# Patient Record
Sex: Male | Born: 1935 | Race: White | Hispanic: No | Marital: Married | Smoking: Former smoker
Health system: Southern US, Community
[De-identification: ages and names within clinical notes are randomized; demographics above are authoritative.]

## PROBLEM LIST (undated history)

## (undated) DIAGNOSIS — E785 Hyperlipidemia, unspecified: Secondary | ICD-10-CM

## (undated) DIAGNOSIS — J189 Pneumonia, unspecified organism: Secondary | ICD-10-CM

## (undated) DIAGNOSIS — M1990 Unspecified osteoarthritis, unspecified site: Secondary | ICD-10-CM

## (undated) DIAGNOSIS — R609 Edema, unspecified: Secondary | ICD-10-CM

## (undated) DIAGNOSIS — N179 Acute kidney failure, unspecified: Secondary | ICD-10-CM

## (undated) DIAGNOSIS — I10 Essential (primary) hypertension: Secondary | ICD-10-CM

## (undated) DIAGNOSIS — I739 Peripheral vascular disease, unspecified: Secondary | ICD-10-CM

## (undated) DIAGNOSIS — I219 Acute myocardial infarction, unspecified: Secondary | ICD-10-CM

## (undated) DIAGNOSIS — Z5189 Encounter for other specified aftercare: Secondary | ICD-10-CM

## (undated) DIAGNOSIS — C799 Secondary malignant neoplasm of unspecified site: Secondary | ICD-10-CM

## (undated) DIAGNOSIS — Z932 Ileostomy status: Secondary | ICD-10-CM

## (undated) DIAGNOSIS — I340 Nonrheumatic mitral (valve) insufficiency: Secondary | ICD-10-CM

## (undated) DIAGNOSIS — I509 Heart failure, unspecified: Secondary | ICD-10-CM

## (undated) DIAGNOSIS — I519 Heart disease, unspecified: Secondary | ICD-10-CM

## (undated) DIAGNOSIS — I480 Paroxysmal atrial fibrillation: Secondary | ICD-10-CM

## (undated) DIAGNOSIS — D649 Anemia, unspecified: Secondary | ICD-10-CM

## (undated) DIAGNOSIS — I252 Old myocardial infarction: Secondary | ICD-10-CM

## (undated) DIAGNOSIS — Z87442 Personal history of urinary calculi: Secondary | ICD-10-CM

## (undated) DIAGNOSIS — I2510 Atherosclerotic heart disease of native coronary artery without angina pectoris: Secondary | ICD-10-CM

## (undated) DIAGNOSIS — Z923 Personal history of irradiation: Secondary | ICD-10-CM

## (undated) DIAGNOSIS — IMO0001 Reserved for inherently not codable concepts without codable children: Secondary | ICD-10-CM

## (undated) DIAGNOSIS — C801 Malignant (primary) neoplasm, unspecified: Secondary | ICD-10-CM

## (undated) DIAGNOSIS — M109 Gout, unspecified: Secondary | ICD-10-CM

## (undated) HISTORY — PX: COLECTOMY: SHX59

## (undated) HISTORY — DX: Atherosclerotic heart disease of native coronary artery without angina pectoris: I25.10

## (undated) HISTORY — PX: CATARACT EXTRACTION: SUR2

## (undated) HISTORY — DX: Heart disease, unspecified: I51.9

## (undated) HISTORY — DX: Essential (primary) hypertension: I10

## (undated) HISTORY — DX: Personal history of urinary calculi: Z87.442

## (undated) HISTORY — PX: APPENDECTOMY: SHX54

## (undated) HISTORY — DX: Old myocardial infarction: I25.2

## (undated) HISTORY — DX: Paroxysmal atrial fibrillation: I48.0

## (undated) HISTORY — DX: Acute kidney failure, unspecified: N17.9

## (undated) HISTORY — DX: Hyperlipidemia, unspecified: E78.5

---

## 1973-04-23 HISTORY — PX: ILEOSTOMY: SHX1783

## 2005-06-21 ENCOUNTER — Ambulatory Visit: Payer: Self-pay | Admitting: Internal Medicine

## 2005-06-29 ENCOUNTER — Ambulatory Visit: Payer: Self-pay | Admitting: Internal Medicine

## 2006-08-31 ENCOUNTER — Emergency Department (HOSPITAL_COMMUNITY): Admission: EM | Admit: 2006-08-31 | Discharge: 2006-08-31 | Payer: Self-pay | Admitting: Emergency Medicine

## 2008-10-07 ENCOUNTER — Encounter: Payer: Self-pay | Admitting: Internal Medicine

## 2009-07-19 ENCOUNTER — Encounter: Payer: Self-pay | Admitting: Family Medicine

## 2009-07-19 ENCOUNTER — Encounter: Payer: Self-pay | Admitting: Cardiology

## 2010-03-19 ENCOUNTER — Other Ambulatory Visit: Payer: Self-pay | Admitting: Emergency Medicine

## 2010-03-19 ENCOUNTER — Inpatient Hospital Stay (HOSPITAL_COMMUNITY): Admission: EM | Admit: 2010-03-19 | Discharge: 2010-03-21 | Payer: Self-pay | Admitting: Internal Medicine

## 2010-03-23 ENCOUNTER — Encounter: Payer: Self-pay | Admitting: Family Medicine

## 2010-03-24 LAB — CONVERTED CEMR LAB
CO2: 19 meq/L (ref 19–32)
Calcium: 9 mg/dL (ref 8.4–10.5)
Creatinine, Ser: 1.37 mg/dL (ref 0.40–1.50)
Glucose, Bld: 101 mg/dL — ABNORMAL HIGH (ref 70–99)
Sodium: 138 meq/L (ref 135–145)

## 2010-03-30 ENCOUNTER — Ambulatory Visit: Payer: Self-pay | Admitting: Family Medicine

## 2010-03-30 DIAGNOSIS — I10 Essential (primary) hypertension: Secondary | ICD-10-CM

## 2010-03-30 DIAGNOSIS — I2510 Atherosclerotic heart disease of native coronary artery without angina pectoris: Secondary | ICD-10-CM | POA: Insufficient documentation

## 2010-05-26 ENCOUNTER — Telehealth (INDEPENDENT_AMBULATORY_CARE_PROVIDER_SITE_OTHER): Payer: Self-pay | Admitting: *Deleted

## 2010-05-31 ENCOUNTER — Encounter: Payer: Self-pay | Admitting: Family Medicine

## 2010-06-01 ENCOUNTER — Ambulatory Visit: Payer: Self-pay

## 2010-06-01 ENCOUNTER — Encounter: Payer: Self-pay | Admitting: Family Medicine

## 2010-06-01 DIAGNOSIS — I739 Peripheral vascular disease, unspecified: Secondary | ICD-10-CM | POA: Insufficient documentation

## 2010-06-03 ENCOUNTER — Encounter: Payer: Self-pay | Admitting: Family Medicine

## 2010-07-10 ENCOUNTER — Encounter: Payer: Self-pay | Admitting: Internal Medicine

## 2010-08-04 ENCOUNTER — Ambulatory Visit: Payer: Self-pay | Admitting: Family Medicine

## 2010-08-04 ENCOUNTER — Ambulatory Visit: Payer: Self-pay | Admitting: Cardiology

## 2010-08-04 ENCOUNTER — Encounter: Payer: Self-pay | Admitting: Family Medicine

## 2010-08-04 ENCOUNTER — Inpatient Hospital Stay (HOSPITAL_COMMUNITY): Admission: EM | Admit: 2010-08-04 | Discharge: 2010-08-07 | Payer: Self-pay | Admitting: Emergency Medicine

## 2010-08-04 DIAGNOSIS — Z87448 Personal history of other diseases of urinary system: Secondary | ICD-10-CM

## 2010-08-04 DIAGNOSIS — E785 Hyperlipidemia, unspecified: Secondary | ICD-10-CM

## 2010-08-04 DIAGNOSIS — H3530 Unspecified macular degeneration: Secondary | ICD-10-CM | POA: Insufficient documentation

## 2010-08-04 DIAGNOSIS — Z8719 Personal history of other diseases of the digestive system: Secondary | ICD-10-CM | POA: Insufficient documentation

## 2010-08-06 ENCOUNTER — Encounter: Payer: Self-pay | Admitting: Family Medicine

## 2010-08-08 ENCOUNTER — Ambulatory Visit: Payer: Self-pay | Admitting: Family Medicine

## 2010-08-08 DIAGNOSIS — N210 Calculus in bladder: Secondary | ICD-10-CM | POA: Insufficient documentation

## 2010-08-09 ENCOUNTER — Encounter (INDEPENDENT_AMBULATORY_CARE_PROVIDER_SITE_OTHER): Payer: Self-pay | Admitting: *Deleted

## 2010-08-31 ENCOUNTER — Telehealth (INDEPENDENT_AMBULATORY_CARE_PROVIDER_SITE_OTHER): Payer: Self-pay | Admitting: *Deleted

## 2010-09-07 ENCOUNTER — Encounter: Payer: Self-pay | Admitting: Family Medicine

## 2010-09-12 ENCOUNTER — Ambulatory Visit: Payer: Self-pay | Admitting: Cardiology

## 2010-09-12 ENCOUNTER — Encounter: Payer: Self-pay | Admitting: Family Medicine

## 2010-10-05 ENCOUNTER — Telehealth (INDEPENDENT_AMBULATORY_CARE_PROVIDER_SITE_OTHER): Payer: Self-pay | Admitting: *Deleted

## 2010-10-06 ENCOUNTER — Encounter: Payer: Self-pay | Admitting: Cardiology

## 2010-10-06 ENCOUNTER — Encounter (HOSPITAL_COMMUNITY)
Admission: RE | Admit: 2010-10-06 | Discharge: 2010-10-21 | Payer: Self-pay | Source: Home / Self Care | Attending: Cardiology | Admitting: Cardiology

## 2010-10-06 ENCOUNTER — Ambulatory Visit: Payer: Self-pay

## 2010-10-06 ENCOUNTER — Ambulatory Visit: Payer: Self-pay | Admitting: Cardiology

## 2010-10-07 HISTORY — PX: CARDIOVASCULAR STRESS TEST: SHX262

## 2010-10-09 ENCOUNTER — Encounter: Payer: Self-pay | Admitting: Family Medicine

## 2010-10-11 ENCOUNTER — Ambulatory Visit: Payer: Self-pay | Admitting: Cardiology

## 2010-10-11 ENCOUNTER — Encounter: Payer: Self-pay | Admitting: Family Medicine

## 2010-10-14 ENCOUNTER — Telehealth (INDEPENDENT_AMBULATORY_CARE_PROVIDER_SITE_OTHER): Payer: Self-pay | Admitting: *Deleted

## 2010-10-14 ENCOUNTER — Ambulatory Visit (HOSPITAL_COMMUNITY): Admission: RE | Admit: 2010-10-14 | Discharge: 2010-10-14 | Payer: Self-pay | Admitting: Cardiology

## 2010-10-14 ENCOUNTER — Inpatient Hospital Stay (HOSPITAL_BASED_OUTPATIENT_CLINIC_OR_DEPARTMENT_OTHER): Admission: RE | Admit: 2010-10-14 | Discharge: 2010-10-14 | Payer: Self-pay | Admitting: Cardiology

## 2010-10-14 ENCOUNTER — Ambulatory Visit: Payer: Self-pay | Admitting: Cardiology

## 2010-10-14 ENCOUNTER — Encounter: Payer: Self-pay | Admitting: Cardiology

## 2010-10-14 HISTORY — PX: CARDIAC CATHETERIZATION: SHX172

## 2010-10-18 ENCOUNTER — Ambulatory Visit: Payer: Self-pay | Admitting: Surgery

## 2010-10-24 ENCOUNTER — Ambulatory Visit: Payer: Self-pay | Admitting: Cardiology

## 2010-10-24 ENCOUNTER — Ambulatory Visit: Payer: Self-pay | Admitting: Surgery

## 2010-10-24 ENCOUNTER — Inpatient Hospital Stay (HOSPITAL_COMMUNITY): Admission: RE | Admit: 2010-10-24 | Discharge: 2010-10-31 | Payer: Self-pay | Admitting: Surgery

## 2010-10-24 HISTORY — PX: CORONARY ARTERY BYPASS GRAFT: SHX141

## 2010-11-04 ENCOUNTER — Encounter: Payer: Self-pay | Admitting: Family Medicine

## 2010-11-04 ENCOUNTER — Ambulatory Visit: Payer: Self-pay | Admitting: Cardiology

## 2010-11-08 ENCOUNTER — Encounter: Payer: Self-pay | Admitting: Family Medicine

## 2010-11-09 ENCOUNTER — Ambulatory Visit: Payer: Self-pay | Admitting: Cardiology

## 2010-11-14 ENCOUNTER — Ambulatory Visit: Payer: Self-pay | Admitting: Cardiovascular Disease

## 2010-11-15 ENCOUNTER — Ambulatory Visit: Payer: Self-pay | Admitting: Surgery

## 2010-11-15 ENCOUNTER — Encounter: Admission: RE | Admit: 2010-11-15 | Discharge: 2010-11-15 | Payer: Self-pay | Admitting: Psychiatry

## 2010-11-21 ENCOUNTER — Ambulatory Visit: Payer: Self-pay | Admitting: Cardiology

## 2010-12-06 ENCOUNTER — Ambulatory Visit: Payer: Self-pay | Admitting: Surgery

## 2010-12-07 ENCOUNTER — Ambulatory Visit: Payer: Self-pay | Admitting: Cardiology

## 2010-12-07 ENCOUNTER — Encounter: Payer: Self-pay | Admitting: Cardiology

## 2011-01-11 ENCOUNTER — Ambulatory Visit: Payer: Self-pay | Admitting: Cardiology

## 2011-01-16 ENCOUNTER — Ambulatory Visit (HOSPITAL_COMMUNITY)
Admission: RE | Admit: 2011-01-16 | Discharge: 2011-01-16 | Payer: Self-pay | Source: Home / Self Care | Attending: Cardiology | Admitting: Cardiology

## 2011-01-16 ENCOUNTER — Ambulatory Visit: Admission: RE | Admit: 2011-01-16 | Discharge: 2011-01-16 | Payer: Self-pay | Source: Home / Self Care

## 2011-01-16 HISTORY — PX: TRANSTHORACIC ECHOCARDIOGRAM: SHX275

## 2011-01-23 ENCOUNTER — Other Ambulatory Visit: Payer: Self-pay | Admitting: Gastroenterology

## 2011-01-23 DIAGNOSIS — D509 Iron deficiency anemia, unspecified: Secondary | ICD-10-CM

## 2011-01-24 NOTE — Miscellaneous (Signed)
Summary: CABG  Clinical Lists Changes  Observations: Added new observation of PAST SURG HX: total colectomy with ileostomy for UC  CABG 10-24-2010 with postop A Fib (11/08/2010 15:59) Added new observation of PRIMARY MD: Seymour Bars DO (11/08/2010 15:59)        Past History:  Past Surgical History: total colectomy with ileostomy for UC  CABG 10-24-2010 with postop A Fib

## 2011-01-24 NOTE — Progress Notes (Signed)
Summary: Nuc Pre-Procedure  Phone Note Outgoing Call Call back at [facility] Phone [phone]   Call placed by: Antionette Char RN,  October 05, 2010 3:16 PM Call placed to: Patient Reason for Call: Confirm/change Appt Summary of Call: Reviewed information on Myoview Information Sheet (see scanned document for further details).  Spoke with patient.     Nuclear Med Background Indications for Stress Test: Evaluation for Ischemia   History: Myocardial Infarction  History Comments: 1970 MI     Nuclear Pre-Procedure Cardiac Risk Factors: Family History - CAD, Hypertension, Lipids Height (in): 65

## 2011-01-24 NOTE — Letter (Signed)
Summary: [facility]   Imported By: Lanelle Bal 11/21/2010 10:07:56  _____________________________________________________________________  External Attachment:    Type:   Image     Comment:   External Document

## 2011-01-24 NOTE — Assessment & Plan Note (Signed)
Summary: Cardiology Nuclear Testing  Nuclear Med Background Indications for Stress Test: Evaluation for Ischemia   History: Myocardial Infarction, Myocardial Perfusion Study  History Comments: '03 UEA:[id number] scar with ischemia at the apical aspect of the inferior wall, EF=43%  Symptoms: DOE    Nuclear Pre-Procedure Cardiac Risk Factors: Family History - CAD, History of Smoking, Hypertension, Lipids, Overweight Caffeine/Decaff Intake: None NPO After: 6:00 PM Lungs: Clear IV 0.9% NS with Angio Cath: 24g     IV Site: R Hand IV Started by: Irean Hong, RN Chest Size (in) 44     Height (in): 65 Weight (lb): 182 BMI: 30.40 Tech Comments: Held metoprolol x 24 hours.  Nuclear Med Study 1 or 2 day study:  1 day     Stress Test Type:  Stress Reading MD:  Olga Millers, MD     Referring MD:  Roger Shelter, MD Resting Radionuclide:  Technetium 44m Tetrofosmin     Resting Radionuclide Dose:  11 mCi  Stress Radionuclide:  Technetium 67m Tetrofosmin     Stress Radionuclide Dose:  33 mCi   Stress Protocol Exercise Time (min):  4:00 min     Max HR:  160 bpm     Predicted Max HR:  143 bpm  Max Systolic BP: 190 mm Hg     Percent Max HR:  111.89 %     METS: 5.7 Rate Pressure Product:  95621    Stress Test Technologist:  Rea College, CMA-N     Nuclear Technologist:  Doyne Keel, CNMT  Rest Procedure  Myocardial perfusion imaging was performed at rest 45 minutes following the intravenous administration of Technetium 71m Tetrofosmin.  Stress Procedure  Baseline EKG shows new ST-T wave changes.  The patient exercised for 4:00.  The patient stopped due to fatigue and denied any chest pain.  There were ST-T wave changes and multiple ectopy noted.  He had frequent PVC's with couplets, runs of PSVT and NSVT.  He had a hypertensive response noted in recovery, 190/105.  Technetium 47m Tetrofosmin was injected at peak exercise and myocardial perfusion imaging was performed after a brief  delay.                                                                                                                                                                                                                  EKG's and stress images were reviewed by Dr. Ladona Ridgel, DOD, and patient was given Metoprolol 50mg  1/4 tablet.  Patient was allowed to leave and follow up with  Dr. Deborah Chalk.  QPS Raw Data Images:  There is no interference from other nuclear activity. Stress Images:  There is decreased uptake in the inferior wall Rest Images:  There is decreased uptake in the inferior wall. Subtraction (SDS):  There is a fixed defect that is most consistent with a previous infarction. Transient Ischemic Dilatation:  1.13  (Normal <1.22)  Lung/Heart Ratio:  0.38  (Normal <0.45)  Quantitative Gated Spect Images QGS EDV:  226 ml QGS ESV:  152 ml QGS EF:  33 % QGS cine images:  Global hypokinesis; inferior akinesis; severe LVE.   Overall Impression  Exercise Capacity: Poor exercise capacity. BP Response: Normal blood pressure response. Clinical Symptoms: No chest pain ECG Impression: Significant ST abnormalities consistent with ischemia; 3 beats NSVT. Overall Impression: Abnormal stress nuclear study with prior inferior infarct; no ischemia.

## 2011-01-24 NOTE — Assessment & Plan Note (Signed)
Summary: HFU ARF   Vital Signs:  Patient profile:   77 year old male Height:      65 inches Weight:      178 pounds BMI:     29.73 O2 Sat:      100 % on Room air Pulse rate:   93 / minute BP sitting:   113 / 79  (left arm) Cuff size:   large  Vitals Entered By: Payton Spark CMA (August 08, 2010 9:43 AM)  O2 Flow:  Room air CC: Hosp f/u.    Primary Care Provider:  Seymour Bars DO  CC:  Hosp f/u. [name]  History of Present Illness: 77 yo WM presents for HFU visit today.  He was admitted to [facility] last Thursday and dischared home on Sunday after having a syncopal episode following a long hot car ride back from [place].  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  The combination of sweating, diarrhea, nasuae and his BP meds caused a drop in his BP and he was admitted with ARF, a Cr up to 5.2 which improved to 1.33 yesterday.  An Echo showed mild diastolic dyfunction and LVH but was o/w normal.  He was found to have kidney stones and a bladder stone.  He is not having problems passing his urine at this point.  He is supposed to have f/u with Dr [name].    He wants to change his cardiologist to Dr Deborah Chalk, whom his wife sees.  He denies CP, SOB, heart palpitations.  He is still having some anorexia and fatige but denies dizziness or nausea.  He is drinking more gatorade.    His BP meds were changed.  He was started on Flomax.  Current Medications (verified): 1)  Tamsulosin Hcl 0.4 Mg Caps (Tamsulosin Hcl) .... Take 1 Tab By Mouth At Bedtime 2)  Simvastatin 80 Mg Tabs (Simvastatin) .... Take 1 Tab By Mouth At Bedtime 3)  Folic Acid 400 Mcg Tabs (Folic Acid) .... Take 1 Tab By Mouth Once Daily 4)  Ascriptin 325 Mg Tabs (Aspirin Buf(Alhyd-Mghyd-Cacar)) .... Take 1 Tab By Mouth Once Daily 5)  Flaxseed Oil 1000 Mg Caps (Flaxseed (Linseed)) 6)  Fish Oil 1200 Mg Caps (Omega-3 Fatty Acids) 7)  Lisinopril 10 Mg Tabs (Lisinopril) .... Take 1 Tab By Mouth Once Daily 8)  Centrum Silver  Tabs  (Multiple Vitamins-Minerals) .... Once Daily 9)  Vitamin D3 400 Unit Tabs (Cholecalciferol) .... Otc Daily 10)  Icaps  Caps (Multiple Vitamins-Minerals) .... 2 Tablets Daily 11)  Lutein 20 Mg Caps (Lutein) .... 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 Retired.  Widowed in 02.  Remarried to [place]. Has 1 adopted daughter in [place] with teenage grandson. Quit smoking in 1970. 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   Head:  normocephalic, atraumatic, and male-pattern balding.   Eyes:  sclera non icteric Mouth:  pharynx pink and moist.   Neck:  no masses.  no JVD.  No carotid bruits Lungs:  Normal respiratory effort, chest expands symmetrically. Lungs are clear to auscultation, no crackles or wheezes. Heart:  normal rate and regular rhythm.  no M/R/G Abdomen:  soft, non-tender, and no distention.  no AA bruits Pulses:  2+ radial pulses Extremities:  no LE edema Skin:  color normal.  AKs on head no pallor.  R antecubital bruise Cervical Nodes:  No lymphadenopathy noted Psych:  good eye contact, not anxious appearing, and not depressed appearing.     Impression & Recommendations:  Problem # 1:  RENAL FAILURE, ACUTE (ICD-584.9) Assessment Improved Reviewed hospital labs/ notes.  It appears that his ARF resolved during hospital stay, discharged home with a CR of 1.33 ysterday, down from a peak of 5.2.   This was likely to be from dehydration/ hypotension.  He did recieve IV Fluid hydration during his stay.  He may have also had an obstructive componenet given his kidney/ bladder stones with hx of enlarged prostate.  he is to stay on the updated meds and will f/u with Dr [name] for the stones.  Stay on Flomax and given a strainer today for his urine.  He is not yet due for repeat renal function.  Problem # 2:  CALCULUS, BLADDER (ICD-594.1) Denies acute voiding problems or hematuria.  Will get him scheduled to see Dr Caleen Essex in the office.  Reviewed CT Abd/ Pelvis done in the hospital. Orders: Urology Referral (Urology)  Problem # 3:  ESSENTIAL HYPERTENSION, BENIGN (ICD-401.1) Assessment: Improved Continue new  meds.  Off Norvasc-- drug interaction with Sivmastatin 80. The following medications were removed from the medication list:    Norvasc 10 Mg Tabs (Amlodipine besylate) .[name]... 1 tab by mouth daily His updated medication list for this problem includes:    Lisinopril 10 Mg Tabs (Lisinopril) .[name]... Take 1 tab by mouth once daily  BP today: 113/79 Prior BP: 99/61 (08/04/2010)  Labs Reviewed: K+: 3.9 (03/23/2010) Creat: : 1.37 (03/23/2010)     Problem # 4:  CAD (ICD-414.00) Reviewed his cardiac w/u including ECHO done at [facility] last wk.  He wants to see Dr Deborah Chalk for f/u, so will get this scheduled.  Apparently, he just had carotid dopplers, aortic u/s and ABIs done thru [facility]. The following medications were removed from the medication list:    Norvasc 10 Mg Tabs (Amlodipine besylate) .[name]... 1 tab by mouth daily His updated medication list for this problem includes:    Ascriptin 325 Mg Tabs (Aspirin buf(alhyd-mghyd-cacar)) .[name]... Take 1 tab by mouth once daily    Lisinopril 10 Mg Tabs (Lisinopril) .[name]... Take 1 tab by mouth once daily  Complete Medication List: 1)  Tamsulosin Hcl 0.4 Mg Caps (Tamsulosin hcl) .... Take 1 tab by mouth at bedtime 2)  Simvastatin 80 Mg Tabs (Simvastatin) ....  Take 1 tab by mouth at bedtime 3)  Folic Acid 400 Mcg Tabs (Folic acid) .... Take 1 tab by mouth once daily 4)  Ascriptin 325 Mg Tabs (Aspirin buf(alhyd-mghyd-cacar)) .... Take 1 tab by mouth once daily 5)  Flaxseed Oil 1000 Mg Caps (Flaxseed (linseed)) 6)  Fish Oil 1200 Mg Caps (Omega-3 fatty acids) 7)  Lisinopril 10 Mg Tabs (Lisinopril) .... Take 1 tab by mouth once daily 8)  Centrum Silver Tabs (Multiple vitamins-minerals) .... Once daily 9)  Vitamin D3 400 Unit Tabs (Cholecalciferol) .... Otc daily 10)  Icaps Caps (Multiple vitamins-minerals) .... 2 tablets daily 11)  Lutein 20 Mg Caps (Lutein) .... Daily 12)  Metoprolol 12.5 Mg  .Marland KitchenMarland KitchenMarland Kitchen  1/2 tab by mouth bid  Other Orders: Cardiology Referral (Cardiology)  Patient Instructions: 1)  Stay on current meds. 2)  BP looks great. 3)  ECHO fairly normal. 4)  Cr improved to 1.33 yesterday. 5)  OK to return to work tomorrow. 6)  Referrals made to cardiologist and urologist.   7)  Return for follow up in 3 mos.

## 2011-01-24 NOTE — Letter (Signed)
Summary: [facility]   Imported By: Lanelle Bal 09/20/2010 11:33:19  _____________________________________________________________________  External Attachment:    Type:   Image     Comment:   External Document

## 2011-01-24 NOTE — Assessment & Plan Note (Signed)
Summary: NOV HFU for dehydration   Vital Signs:  Patient profile:   77 year old male Height:      65 inches Weight:      196 pounds BMI:     32.73 O2 Sat:      98 % on Room air Temp:     99.2 degrees F oral Pulse rate:   82 / minute BP sitting:   146 / 85  (left arm) Cuff size:   large  Vitals Entered By: Payton Spark CMA (March 30, 2010 8:38 AM)  O2 Flow:  Room air CC: New to est. Also c/o L elbow pain x 1 week.    Primary Care Provider:  Seymour Bars DO  CC:  New to est. Also c/o L elbow pain x 1 week. [name]  History of Present Illness: 77 yo WM presents for NOV.  He ate out last Thursday at the [employer] and had diarrhea that night.  He missed work Friday.  On Sat, his BP started dropping.  Went to the ED.  He was admitted to [facility] until Monday.  He was hydrated with iV fluids, his Lisinopril was held and he was treated with abx which he was not sent home on.  His f/u BMP last wk came back to normal with normal electrolytes and renal function.  His bowels and appetite have returned to normal.  He has continued to hold his Lisinopril.  He had been seeing Dr Juanita Laster at [place] but he lives closer to [place].  He has a hx of AMI at age 56, HTN, high cholesterol and ulcerative colitis.  He has not routinely followed with cardiology.  He reports being due for AAA screening.     He goes to the [place] [facility].      Current Medications (verified): 1)  Felodipine 10 Mg Xr24h-Tab (Felodipine) .... Take 1 Tab By Mouth Once Daily 2)  Metoprolol Tartrate 100 Mg Tabs (Metoprolol Tartrate) .... Take 1 Tab By Mouth Two Times A Day 3)  Simvastatin 80 Mg Tabs (Simvastatin) .... Take 1 Tab By Mouth At Bedtime 4)  Folic Acid 400 Mcg Tabs (Folic Acid) .... Take 1 Tab By Mouth Once Daily 5)  Ascriptin 325 Mg Tabs (Aspirin Buf(Alhyd-Mghyd-Cacar)) .... 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  Only 1 brother living 2 sibblings died - AMI, leukemia  Social History: Retired.  Widowed in 02.  Remarried. Has 1 adopted daughter in [place] with teenage grandson. Quit smoking in 1970. Denies ETOH. Works part time transporting cars for the auction  Review of Systems       no fevers/sweats/weakness, unexplained wt loss/gain, no change in vision, no difficulty hearing, ringing in ears, no hay fever/allergies, no CP/discomfort, no palpitations, no breast lump/nipple discharge, no cough/wheeze, no blood in stool, no N/V/D, no nocturia, no leaking urine, no unusual vag bleeding, no vaginal/penile discharge, no muscle/joint pain, no rash, no new/changing mole, no HA, no memory loss, no anxiety, no sleep problem, no depression, no unexplained lumps, no easy  bruising/bleeding, no concern with sexual function   Physical Exam  General:  alert, well-developed, well-nourished, and well-hydrated.   Head:  normocephalic, atraumatic, male-pattern balding, and scaling.   Eyes:  wears glasses, sclera non icteric Ears:  sun damage Nose:  no nasal discharge.   Mouth:  pharynx pink and moist.   Neck:  no masses.  no audible carotid bruits Lungs:  Normal respiratory effort, chest expands symmetrically. Lungs are clear to auscultation, no crackles or wheezes. Heart:  normal rate and regular rhythm.  +1/6 Systolic ejection murmur Abdomen:  soft, non-tender, normal bowel sounds, no distention, no guarding, no hepatomegaly, and no splenomegaly.  no AA bruits Msk:  L elbow warm red effusion with full active ROM Pulses:  2+ radial pulses Extremities:  1+  LE pitting edema Neurologic:  no tremor Skin:  sun damaged skin with multiple AKs Cervical Nodes:  No lymphadenopathy noted Psych:  Oriented X3, memory intact for recent and remote, good eye contact, not anxious appearing, and not depressed appearing.     Impression & Recommendations:  Problem # 1:  DEHYDRATION (ICD-276.51) Assessment Improved Resolved.  Secondary to food poisoning.  Treated in hospital with IV fluids/ abx.  BMP came back to normal.  Appears well hydrated and bowels have returned to normal.    Problem # 2:  ESSENTIAL HYPERTENSION, BENIGN (ICD-401.1) BP high today.  Restart Lisinopril since normal hydration status and normal BMP. His updated medication list for this problem includes:    Felodipine 10 Mg Xr24h-tab (Felodipine) .[name]... Take 1 tab by mouth once daily    Metoprolol Tartrate 100 Mg Tabs (Metoprolol tartrate) .[name]... Take 1 tab by mouth two times a day    Lisinopril 40 Mg Tabs (Lisinopril) .[name]... 1 tab by mouth daily  BP today: 146/85  Labs Reviewed: K+: 3.9 (03/23/2010) Creat: : 1.37 (03/23/2010)     Problem # 3:  CAD (ICD-414.00) Stabel on meds and is not symptomatic.  Will review his records.  Update AAA screening and PAD screening thru [facility].  May need to set up care with Dr Jens Som and update stress testing since he is high risk.   His updated medication list for this problem includes:    Felodipine 10 Mg Xr24h-tab (Felodipine) .[name]... Take 1 tab by mouth once daily    Metoprolol Tartrate 100 Mg Tabs (Metoprolol tartrate) .[name]... Take 1 tab by mouth two times a day    Ascriptin 325 Mg Tabs (Aspirin buf(alhyd-mghyd-cacar)) .[name]... Take 1 tab by mouth once daily    Lisinopril 40 Mg Tabs (Lisinopril) .[name]... 1 tab by mouth daily  Orders: T-Arterial Duplex Lower Complete (04540) T-Ultrasound Abdominal, Complete (98119)  Problem # 4:  EFFUSION OF UPPER ARM JOINT (ICD-719.02) Assessment: New Refer to ortho for drainage of effusion today -- inflammatory,  infectious or gout. Orders: Sports Medicine (Sports Med)  Complete Medication List: 1)  Felodipine 10 Mg Xr24h-tab (Felodipine) .... Take 1 tab by mouth once daily 2)  Metoprolol Tartrate 100 Mg Tabs (Metoprolol tartrate) .... Take 1 tab by mouth two times a day 3)  Simvastatin 80 Mg Tabs (Simvastatin) .... Take 1 tab by mouth at bedtime 4)  Folic Acid 400 Mcg Tabs (Folic acid) .... Take 1 tab by mouth once daily 5)  Ascriptin 325 Mg Tabs (Aspirin buf(alhyd-mghyd-cacar)) .... Take 1 tab by mouth once daily 6)  Flaxseed Oil 1000 Mg Caps (Flaxseed (linseed)) 7)  Fish Oil 1200 Mg Caps (Omega-3 fatty acids) 8)  Lisinopril 40 Mg Tabs (Lisinopril) .Marland KitchenMarland KitchenMarland Kitchen  1 tab by mouth daily  Patient Instructions: 1)  Set up abdominal aneursym screening and PAD screening at [facility] cardiology in [place]. 2)  Will get you in with ortho for L elbow effusion. 3)  Restart Lisinopril 40 mg once a day for high BP. 4)  Return for follow up in 3 mos.

## 2011-01-24 NOTE — Letter (Signed)
Summary: Generic Letter  [facility]  [address], [address]   [place], [place] [address]   Phone: [phone]  Fax: [phone]    08/08/2010  [name] [name] [address] [place], [place]  [address]  To Whom It May Concern,  [name] has been medically cleared to return to work tomorrow, 08/09/2010.       Sincerely,    Seymour Bars DO

## 2011-01-24 NOTE — Letter (Signed)
Summary: [facility]  [place]   Imported By: Sherian Rein 10/25/2010 11:46:59  _____________________________________________________________________  External Attachment:    Type:   Image     Comment:   External Document

## 2011-01-24 NOTE — Letter (Signed)
Summary: Primary Care Consult Scheduled Letter  [facility] [place]  [address]   [place], [place] [address]   Phone: [phone]  Fax: [phone]      08/09/2010 MRN: [id number]  [facility] [name] [name] [address] [place], [place]  [address]    Dear [name],     We have scheduled an appointment for you.  At the recommendation of Dr.Bowen, we have scheduled you a consult with [facility] in [place] on 09/01/10, Thursday at 12:45.  Their address is [address] [place]. [phone] ( 2nd Floor ). The office phone number is [phone].  If this appointment day and time is not convenient for you, please feel free to call the office of the doctor you are being referred to at the number listed above and reschedule the appointment.     It is important for you to keep your scheduled appointments. We are here to make sure you are given good patient care.    Thank you, Michaelle Copas , [phone] Patient Care Coordinator [facility] [place]

## 2011-01-24 NOTE — Progress Notes (Signed)
  Phone Note Refill Request   Refills Requested: Medication #1:  METOPROLOL TARTRATE 100 MG TABS Take 1 tab by mouth two times a day  Medication #2:  FELODIPINE 10 MG XR24H-TAB Take 1 tab by mouth once daily  Medication #3:  SIMVASTATIN 80 MG TABS Take 1 tab by mouth at bedtime  Medication #4:  LISINOPRIL 40 MG TABS 1 tab by mouth daily. Pt would like 90 day supply mailed to him.   Initial call taken by: Payton Spark CMA,  May 26, 2010 4:29 PM    Prescriptions: LISINOPRIL 40 MG TABS (LISINOPRIL) 1 tab by mouth daily  #90 x 1   Entered by:   Payton Spark CMA   Authorized by:   Seymour Bars DO   Signed by:   Payton Spark CMA on 05/26/2010   Method used:   Print then Give to Patient   RxID:   [id number] SIMVASTATIN 80 MG TABS (SIMVASTATIN) Take 1 tab by mouth at bedtime  #90 x 1   Entered by:   Payton Spark CMA   Authorized by:   Seymour Bars DO   Signed by:   Payton Spark CMA on 05/26/2010   Method used:   Print then Give to Patient   RxID:   [id number] METOPROLOL TARTRATE 100 MG TABS (METOPROLOL TARTRATE) Take 1 tab by mouth two times a day  #180 x 1   Entered by:   Payton Spark CMA   Authorized by:   Seymour Bars DO   Signed by:   Payton Spark CMA on 05/26/2010   Method used:   Print then Give to Patient   RxID:   [id number] FELODIPINE 10 MG XR24H-TAB (FELODIPINE) Take 1 tab by mouth once daily  #90 x 1   Entered by:   Payton Spark CMA   Authorized by:   Seymour Bars DO   Signed by:   Payton Spark CMA on 05/26/2010   Method used:   Print then Give to Patient   RxID:   [id number]

## 2011-01-24 NOTE — Miscellaneous (Signed)
Summary: CCB change  Clinical Lists Changes  Medications: Changed medication from FELODIPINE 10 MG XR24H-TAB (FELODIPINE) Take 1 tab by mouth once daily to NORVASC 10 MG TABS (AMLODIPINE BESYLATE) 1 tab by mouth daily - Signed Rx of NORVASC 10 MG TABS (AMLODIPINE BESYLATE) 1 tab by mouth daily;  #90 x 1;  Signed;  Entered by: Seymour Bars DO;  Authorized by: Seymour Bars DO;  Method used: Printed then faxed to [facility], [address] [place], [place], [place]  [address], Ph: [phone], Fax: [phone]    Prescriptions: NORVASC 10 MG TABS (AMLODIPINE BESYLATE) 1 tab by mouth daily  #90 x 1   Entered and Authorized by:   Seymour Bars DO   Signed by:   Seymour Bars DO on 06/03/2010   Method used:   Printed then faxed to ...       [facility] (retail)       [phone]       [place], [place]  [address]       Ph: [phone]       Fax: [phone]   RxID:   [id number]

## 2011-01-24 NOTE — Initial Assessments (Signed)
Summary: hospital admission H&P   Vital Signs:  Patient profile:   77 year old male O2 Sat:      99 % on Room air Pulse rate:   67 / minute Resp:     12 per minute BP supine:   99 / 61  O2 Flow:  Room air  Primary Care Provider:  Seymour Bars DO  CC:  hospital admission H&P.  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  He had no warning to the episode.  he reports no confusion afterward.  he did not fall and hit anything and denies injury.  his colleagues called 911 and he was brought here to the hospital where he was found to have acute renal insufficiency.    He reports for the past week or so he hasn't felt quite right.  He reports 1 week ago he rode in a truck to  [facility] that had no AC.  The heat caused him to get nauseous and vomit.  He did stop and drink some fluids which helped some but again, since then he hasn't felt normal.  He reports since then he's had muscle cramps in his legs, and some  SOB, blurry vision and runny nose.   Additionally he's noted some chills and dysuria and difficulty with his urinary stream particularly in the evenings.  He denies dirrhea, denies chest pains, denies palpitations, denies fever.  For the past month or so he has also noticed he's felt dizzy occasionally when standing up.  He's had no other syncopal episodes.  He denies having felt dizzy this morning.    Of note he was hospitalized earlier this year for ARI due to dehydration from a diarrheal illness.  Since that time he's had little appetite and as a result has lost he believes about 40lbs which is unintentional.   Habits & Providers  Alcohol-Tobacco-Diet     Alcohol drinks/day: <1     Alcohol type: rare glass of wine     Tobacco Status: quit     Year Quit: 1970  Exercise-Depression-Behavior     Drug Use: never  Current Medications  (verified): 1)  Norvasc 10 Mg Tabs (Amlodipine Besylate) .[name].. 1 Tab By Mouth Daily 2)  Metoprolol Tartrate 100 Mg Tabs (Metoprolol Tartrate) .... Take 1 Tab By Mouth Two Times A Day 3)  Simvastatin 80 Mg Tabs (Simvastatin) .... Take 1 Tab By Mouth At Bedtime 4)  Folic Acid 400 Mcg Tabs (Folic Acid) .... Take 1 Tab By Mouth Once Daily 5)  Ascriptin 325 Mg Tabs (Aspirin Buf(Alhyd-Mghyd-Cacar)) .... Take 1 Tab By Mouth Once Daily 6)  Flaxseed Oil 1000 Mg Caps (Flaxseed (Linseed)) 7)  Fish Oil 1200 Mg Caps (Omega-3 Fatty Acids) 8)  Lisinopril 40 Mg Tabs (Lisinopril) .[name].. 1 Tab By Mouth Daily 9)  Centrum Silver  Tabs (Multiple Vitamins-Minerals) .... Once Daily 10)  Vitamin D3 400 Unit Tabs (Cholecalciferol) .... Otc Daily 11)  Icaps  Caps (Multiple Vitamins-Minerals) .... 2 Tablets Daily 12)  Lutein 20 Mg Caps (Lutein) .... Daily  Allergies (verified): No Known Drug Allergies  Past History:  Past Surgical History: Last updated: 04/09/2010 total colectomy with ileostomy for UC   Family History: Last updated: 2010/04/09 mother died of colon cancer at 23 father died of AMI at 39 had 11 older sibblings.  Only 1 brother living 51 sibblings died - AMI, leukemia  Social History: Last updated: 08/04/2010 Retired.  Widowed in 02.  Remarried to [place]. Has 1 adopted daughter in [place] with teenage grandson. Quit smoking in 1970. 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  Widowed in 02.  Remarried to [place]. Has 1 adopted daughter in [place] with teenage grandson. Quit smoking in 1970. 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  NAD.  VS noted.  low BP Head:  normocephalic, atraumatic, male-pattern balding, and scaling.   Eyes:  wears glasses, sclera non icteric. PERRL. EOMI Ears:  sun damage TM's WNL bilaterally Nose:  no nasal discharge.  deviated septum MMM Mouth:  pharynx pink and moist.  dentures in place Neck:  no masses.  no audible carotid bruits Lungs:  Normal respiratory effort, chest expands symmetrically. Lungs are clear to auscultation, no crackles or wheezes. Heart:  normal rate and regular rhythm.  no M/R/G Abdomen:  soft, , normal bowel sounds, no distention, no guarding, no hepatomegaly, and no splenomegaly.  mildly tender in suprapubic region. ileostomy site intact RLQ Extremities:  no edema.  1+ pulses bilateral lower extremities Neurologic:  alert & oriented X3, cranial nerves II-XII intact, strength normal in all extremities, and sensation intact to light touch.   Skin:  sun damaged skin with multiple AKs Psych:  Oriented X3, normally interactive, good eye contact, not anxious appearing, and not depressed appearing.     Complete Medication List: 1)  Norvasc 10 Mg Tabs (Amlodipine besylate) .[name].. 1 tab by mouth daily 2)  Metoprolol Tartrate 100 Mg Tabs (Metoprolol tartrate) .... Take 1 tab by mouth two times a day 3)  Simvastatin 80 Mg Tabs (Simvastatin) .... Take 1 tab by mouth at bedtime 4)  Folic Acid 400 Mcg Tabs (Folic acid) .... Take 1 tab by mouth once daily 5)  Ascriptin 325 Mg Tabs (Aspirin buf(alhyd-mghyd-cacar)) .... Take 1 tab by mouth once daily 6)  Flaxseed Oil 1000 Mg Caps (Flaxseed (linseed)) 7)  Fish Oil 1200 Mg Caps (Omega-3 fatty acids) 8)  Lisinopril 40 Mg Tabs (Lisinopril) .[name].. 1 tab by mouth  daily 9)  Centrum Silver Tabs (Multiple vitamins-minerals) .... Once daily 10)  Vitamin D3 400 Unit Tabs (Cholecalciferol) .... Otc daily 11)  Icaps Caps (Multiple vitamins-minerals) .... 2 tablets daily 12)  Lutein 20 Mg Caps (Lutein) .... Daily   Lab/Studies: EKG: NSR with no acute changes, normal intervals.  old inferior infarct CXR: no infiltrate V/Q: slight elevation of R hemidiaphram, very low probability of PE CK: 174, CKMB: 9.3, Trop 0.05, Index 5.3 POC cardiac enzymes: CKMB 10.9, Trop <0.05, Myoglobin >500 CBC: 11>12.2/34.6<170, 83% PMN, ANC 9.1, MCV, RDW WNL INR 1.05 Istat: NA: 130, K 5.1, CL 107, bicarb 16, BUN 109, Cr 5.2, Glucose 97  A/P: 77 yo with syncopal episode, acute renal insufficiency, unintentional weight  loss, with history of UC s/p ileostomy, vascular disease, HTN, HLD  1) syncopal episode: differential includes vasovagal, orthostasis, cardiovascular, neurologic, other.  unlikely vasovagal given story though not impossible.  I suspect likely orthostasis given recent dizziness, low BP in ER.  will check orthostatic blood pressures to help confirm.  will hold all antihypertensives except Bblocker at this time as well.  Could be cardiovascular but EKG reassurring, no chest pain or palpitations.  to help rule this out will get cycling cardiac enzymes, repeat EKG in AM and monitor on telemetry.  he is established with [facility] if needed. 2) acute renal insufficiency: of course could be prerenal - recently dehydrated and about 1 week ago with likely some dehydration in setting of ACEI.  also as increased risk of dehydration having an ileostomy. will hold ACEI, hydrate.  (has already gotten a great deal of fluids in ER).  electrolytes appear okay at this time but will be monitored closely.  no peaked T's on EKG to suggest hyperkalemia. Also could be post renal (CT in March showed bladder stone) so will place foley and get renal ultrasound.  Also get FeNA and UA,UCx  given some suprapubic tenderness. 3) wt loss: could be related to appetite changes but could consider further cancer workup - likely as outpatient. 4) HTN: hold all but Bblocker.  check orthostatics.   5) CAD/PVD: continue ASA, cycle enzymes as above.  continue Bblocker for this reason. 6) HLD: hold statin given muscle aches, elevated CK.  could consider different statin in the future. 7) FEN/GI: see above.  low NA heart healthy diet. D51/2 NS at 150cc/hr for now. has already gotten bolus in ER. 8) PPX: heparin three times a day 9) Dispo: pending clinical improvement.

## 2011-01-24 NOTE — Letter (Signed)
Summary: [facility]   Imported By: Lanelle Bal 11/05/2010 11:51:12  _____________________________________________________________________  External Attachment:    Type:   Image     Comment:   External Document

## 2011-01-24 NOTE — Progress Notes (Signed)
  Vascular faxed to [place] @ [phone] [facility]  October 14, 2010 12:56 PM

## 2011-01-24 NOTE — Miscellaneous (Signed)
Summary: Orders Update  Clinical Lists Changes  Orders: Added new Test order of Abdominal Aorta Duplex (Abd Aorta Duplex) - Signed 

## 2011-01-24 NOTE — Miscellaneous (Signed)
Summary: Orders Update  Clinical Lists Changes  Problems: Added new problem of PVD (ICD-443.9) Orders: Added new Test order of Arterial Duplex Lower Extremity (Arterial Duplex Low) - Signed 

## 2011-01-24 NOTE — Progress Notes (Signed)
       New/Updated Medications: METOPROLOL TARTRATE 25 MG TABS (METOPROLOL TARTRATE) Take 1/2 tab by mouth two times a day Prescriptions: LISINOPRIL 10 MG TABS (LISINOPRIL) Take 1 tab by mouth once daily  #30 x 2   Entered by:   Payton Spark CMA   Authorized by:   Seymour Bars DO   Signed by:   Payton Spark CMA on 08/31/2010   Method used:   Electronically to        [facility] [address] (retail)       [address] [place], [place]  [address]       Ph: [phone]       Fax: [phone]   RxID:   [id number] METOPROLOL TARTRATE 25 MG TABS (METOPROLOL TARTRATE) Take 1/2 tab by mouth two times a day  #30 x 2   Entered by:   Payton Spark CMA   Authorized by:   Seymour Bars DO   Signed by:   Payton Spark CMA on 08/31/2010   Method used:   Electronically to        [facility] [address] (retail)       [address] [place], [place]  [address]       Ph: [phone]       Fax: [phone]   RxID:   [id number] TAMSULOSIN HCL 0.4 MG CAPS (TAMSULOSIN HCL) Take 1 tab by mouth at bedtime  #30 x 2   Entered by:   Payton Spark CMA   Authorized by:   Seymour Bars DO   Signed by:   Payton Spark CMA on 08/31/2010   Method used:   Electronically to        [facility] [address] (retail)       [address] [place], [place]  [address]       Ph: [phone]       Fax: [phone]   RxID:   [id number]

## 2011-01-24 NOTE — Medication Information (Signed)
Summary: [facility]   Imported By: [employer] 07/14/2010 08:18:47  _____________________________________________________________________  External Attachment:    Type:   Image     Comment:   External Document

## 2011-01-24 NOTE — Letter (Signed)
Summary: [facility]   Imported By: Lanelle Bal 09/26/2010 10:40:05  _____________________________________________________________________  External Attachment:    Type:   Image     Comment:   External Document

## 2011-01-30 ENCOUNTER — Other Ambulatory Visit: Payer: Self-pay | Admitting: Surgery

## 2011-01-30 ENCOUNTER — Ambulatory Visit: Payer: Self-pay | Admitting: *Deleted

## 2011-01-30 DIAGNOSIS — I2510 Atherosclerotic heart disease of native coronary artery without angina pectoris: Secondary | ICD-10-CM

## 2011-01-31 ENCOUNTER — Other Ambulatory Visit: Payer: Self-pay | Admitting: Gastroenterology

## 2011-01-31 ENCOUNTER — Ambulatory Visit
Admission: RE | Admit: 2011-01-31 | Discharge: 2011-01-31 | Disposition: A | Discharge status: Home / Self Care | Payer: Medicare Other | Source: Ambulatory Visit | Attending: Gastroenterology | Admitting: Gastroenterology

## 2011-01-31 ENCOUNTER — Ambulatory Visit
Admission: RE | Admit: 2011-01-31 | Discharge: 2011-01-31 | Disposition: A | Discharge status: Home / Self Care | Payer: Medicare Other | Source: Ambulatory Visit | Attending: Surgery | Admitting: Surgery

## 2011-01-31 ENCOUNTER — Encounter (INDEPENDENT_AMBULATORY_CARE_PROVIDER_SITE_OTHER): Payer: Medicare Other | Admitting: Surgery

## 2011-01-31 DIAGNOSIS — I2510 Atherosclerotic heart disease of native coronary artery without angina pectoris: Secondary | ICD-10-CM

## 2011-01-31 DIAGNOSIS — D509 Iron deficiency anemia, unspecified: Secondary | ICD-10-CM

## 2011-02-03 NOTE — Assessment & Plan Note (Signed)
OFFICE VISIT  [name] [name], [name] [name] DOB:  1930-01-02                                        January 31, 2011 CHART #:  [id number]  The patient returned to my office today for followup of a left pleural effusion, status post coronary artery bypass graft surgery on October 24, 2010.  I last saw him on November 15, 2010, at which time chest x- ray showed a small to moderate-sized left pleural effusion that looked somewhat larger than on previous chest x-ray of October 28, 2010.  He has continued on 10-day course of Lasix and potassium and I asked him to come back about 3 weeks after that for followup chest x-ray, but he had to reschedule an appointment due to work.  He has been walking without any chest pain or pressure.  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  He did not participate in cardiac rehab since it interfered with his work schedule.  PHYSICAL EXAMINATION:  His blood pressure is 123/71, his pulse is 69 and regular, respiratory rate is 20 and unlabored.  Oxygen saturation on room air is 100%.  He looks well.  Cardiac exam shows regular rate and rhythm with normal heart sounds.  His lung exam is clear.  The chest incision is healing well and the sternum is stable.  There is no peripheral edema.  Followup chest x-ray today shows complete resolution of his left pleural effusion.  Lungs otherwise clear.  IMPRESSION:  Overall, the patient looks well and feels well.  I do not think there is a need for any further intervention on my part.  I encouraged him to continue walking as much as possible.  He will continue to follow up with Dr. Deborah Chalk for his Cardiology care.  Evelene Croon, M.D. Electronically Signed  BB/MEDQ  D:  01/31/2011  T:  01/31/2011  Job:  [id number]  cc:   Colleen Can. Deborah Chalk, M.D.

## 2011-02-06 ENCOUNTER — Encounter: Payer: Self-pay | Admitting: Family Medicine

## 2011-02-06 ENCOUNTER — Ambulatory Visit (INDEPENDENT_AMBULATORY_CARE_PROVIDER_SITE_OTHER): Payer: Medicare Other | Admitting: *Deleted

## 2011-02-06 DIAGNOSIS — I4891 Unspecified atrial fibrillation: Secondary | ICD-10-CM

## 2011-02-06 DIAGNOSIS — Z951 Presence of aortocoronary bypass graft: Secondary | ICD-10-CM

## 2011-02-06 DIAGNOSIS — D649 Anemia, unspecified: Secondary | ICD-10-CM

## 2011-02-14 ENCOUNTER — Telehealth: Payer: Self-pay | Admitting: Family Medicine

## 2011-02-20 ENCOUNTER — Other Ambulatory Visit (INDEPENDENT_AMBULATORY_CARE_PROVIDER_SITE_OTHER): Payer: Medicare Other

## 2011-02-20 DIAGNOSIS — D649 Anemia, unspecified: Secondary | ICD-10-CM

## 2011-02-21 NOTE — Progress Notes (Signed)
Summary: CR/ urine  Phone Note Outgoing Call   Summary of Call: Pls call pt and let him know that I recieved a copy of his labs from [facility].  I wanted to make sure he had f/u with Dr [name] for his urine and that he was feeling better.  His kidney function was slightly up on this past draw.  If he is not having labs repeated there, I am happy to recheck his kidney function here.  Pls keep me posted.  Initial call taken by: Seymour Bars DO,  February 14, 2011 12:59 PM  Follow-up for Phone Call        [facility] for Pt to CB Follow-up by: Payton Spark CMA,  February 14, 2011 2:43 PM  Additional Follow-up for Phone Call Additional follow up Details #1::        Pt has not seen Ott[facility] but saw his cardiologist and they did a u/a and a culture and put him on an abx and he is feeling much better. pt is scheduled to to Dr. [name] in march. NOtified him of Dr. Ovidio Kin reccomendations Additional Follow-up by: Avon Gully CMA, Duncan Dull),  February 15, 2011 4:23 PM

## 2011-02-27 ENCOUNTER — Ambulatory Visit (INDEPENDENT_AMBULATORY_CARE_PROVIDER_SITE_OTHER): Payer: Medicare Other | Admitting: Cardiology

## 2011-02-27 DIAGNOSIS — I119 Hypertensive heart disease without heart failure: Secondary | ICD-10-CM

## 2011-02-27 DIAGNOSIS — I2510 Atherosclerotic heart disease of native coronary artery without angina pectoris: Secondary | ICD-10-CM

## 2011-02-27 DIAGNOSIS — I4891 Unspecified atrial fibrillation: Secondary | ICD-10-CM

## 2011-03-06 ENCOUNTER — Telehealth: Payer: Self-pay | Admitting: Family Medicine

## 2011-03-06 DIAGNOSIS — D509 Iron deficiency anemia, unspecified: Secondary | ICD-10-CM | POA: Insufficient documentation

## 2011-03-07 LAB — BASIC METABOLIC PANEL
BUN: 16 mg/dL (ref 6–23)
BUN: 20 mg/dL (ref 6–23)
BUN: 23 mg/dL (ref 6–23)
CO2: 21 mEq/L (ref 19–32)
CO2: 22 mEq/L (ref 19–32)
Calcium: 6.7 mg/dL — ABNORMAL LOW (ref 8.4–10.5)
Calcium: 8.1 mg/dL — ABNORMAL LOW (ref 8.4–10.5)
Calcium: 8.6 mg/dL (ref 8.4–10.5)
Calcium: 8.7 mg/dL (ref 8.4–10.5)
Chloride: 110 mEq/L (ref 96–112)
Chloride: 115 mEq/L — ABNORMAL HIGH (ref 96–112)
Creatinine, Ser: 1.54 mg/dL — ABNORMAL HIGH (ref 0.4–1.5)
Creatinine, Ser: 1.63 mg/dL — ABNORMAL HIGH (ref 0.4–1.5)
Creatinine, Ser: 1.73 mg/dL — ABNORMAL HIGH (ref 0.4–1.5)
GFR calc Af Amer: 47 mL/min — ABNORMAL LOW (ref >60)
GFR calc Af Amer: 49 mL/min — ABNORMAL LOW (ref >60)
GFR calc Af Amer: 53 mL/min — ABNORMAL LOW (ref >60)
GFR calc non Af Amer: 37 mL/min — ABNORMAL LOW (ref >60)
GFR calc non Af Amer: 38 mL/min — ABNORMAL LOW (ref >60)
GFR calc non Af Amer: 41 mL/min — ABNORMAL LOW (ref >60)
GFR calc non Af Amer: 44 mL/min — ABNORMAL LOW (ref >60)
Glucose, Bld: 105 mg/dL — ABNORMAL HIGH (ref 70–99)
Glucose, Bld: 132 mg/dL — ABNORMAL HIGH (ref 70–99)
Potassium: 4.2 mEq/L (ref 3.5–5.1)
Potassium: 4.4 mEq/L (ref 3.5–5.1)
Sodium: 136 mEq/L (ref 135–145)
Sodium: 137 mEq/L (ref 135–145)
Sodium: 139 mEq/L (ref 135–145)
Sodium: 139 mEq/L (ref 135–145)

## 2011-03-07 LAB — CBC
HCT: 23.7 % — ABNORMAL LOW (ref 39.0–52.0)
HCT: 26.1 % — ABNORMAL LOW (ref 39.0–52.0)
Hemoglobin: 7.7 g/dL — ABNORMAL LOW (ref 13.0–17.0)
Hemoglobin: 7.9 g/dL — ABNORMAL LOW (ref 13.0–17.0)
Hemoglobin: 8.6 g/dL — ABNORMAL LOW (ref 13.0–17.0)
Hemoglobin: 8.8 g/dL — ABNORMAL LOW (ref 13.0–17.0)
MCH: 30 pg (ref 26.0–34.0)
MCH: 30.5 pg (ref 26.0–34.0)
MCHC: 33 g/dL (ref 30.0–36.0)
MCHC: 33 g/dL (ref 30.0–36.0)
MCHC: 33.6 g/dL (ref 30.0–36.0)
MCV: 90.9 fL (ref 78.0–100.0)
MCV: 91.9 fL (ref 78.0–100.0)
Platelets: 130 10*3/uL — ABNORMAL LOW (ref 150–400)
Platelets: 134 10*3/uL — ABNORMAL LOW (ref 150–400)
RBC: 2.59 MIL/uL — ABNORMAL LOW (ref 4.22–5.81)
RBC: 2.87 MIL/uL — ABNORMAL LOW (ref 4.22–5.81)
RBC: 2.93 MIL/uL — ABNORMAL LOW (ref 4.22–5.81)
RDW: 13.4 % (ref 11.5–15.5)
RDW: 13.5 % (ref 11.5–15.5)
RDW: 13.6 % (ref 11.5–15.5)
WBC: 16.7 10*3/uL — ABNORMAL HIGH (ref 4.0–10.5)
WBC: 17.5 10*3/uL — ABNORMAL HIGH (ref 4.0–10.5)

## 2011-03-07 LAB — POCT I-STAT, CHEM 8
BUN: 24 mg/dL — ABNORMAL HIGH (ref 6–23)
Chloride: 108 mEq/L (ref 96–112)
Potassium: 3.7 mEq/L (ref 3.5–5.1)
Sodium: 141 mEq/L (ref 135–145)

## 2011-03-07 LAB — URINALYSIS, ROUTINE W REFLEX MICROSCOPIC
Bilirubin Urine: NEGATIVE
Nitrite: NEGATIVE
Protein, ur: NEGATIVE mg/dL
Specific Gravity, Urine: 1.022 (ref 1.005–1.030)
Urobilinogen, UA: 0.2 mg/dL (ref 0.0–1.0)

## 2011-03-07 LAB — PROTIME-INR
INR: 1.24 (ref 0.00–1.49)
INR: 1.44 (ref 0.00–1.49)
Prothrombin Time: 15.8 seconds — ABNORMAL HIGH (ref 11.6–15.2)
Prothrombin Time: 15.8 seconds — ABNORMAL HIGH (ref 11.6–15.2)

## 2011-03-07 LAB — URINE CULTURE
Colony Count: 15000
Culture  Setup Time: 201111021211

## 2011-03-07 LAB — MAGNESIUM
Magnesium: 2.5 mg/dL (ref 1.5–2.5)
Magnesium: 2.7 mg/dL — ABNORMAL HIGH (ref 1.5–2.5)

## 2011-03-07 LAB — GLUCOSE, CAPILLARY
Glucose-Capillary: 114 mg/dL — ABNORMAL HIGH (ref 70–99)
Glucose-Capillary: 129 mg/dL — ABNORMAL HIGH (ref 70–99)
Glucose-Capillary: 129 mg/dL — ABNORMAL HIGH (ref 70–99)
Glucose-Capillary: 146 mg/dL — ABNORMAL HIGH (ref 70–99)

## 2011-03-07 LAB — URINE MICROSCOPIC-ADD ON

## 2011-03-08 LAB — POCT I-STAT 4, (NA,K, GLUC, HGB,HCT)
Glucose, Bld: 100 mg/dL — ABNORMAL HIGH (ref 70–99)
Glucose, Bld: 118 mg/dL — ABNORMAL HIGH (ref 70–99)
HCT: 26 % — ABNORMAL LOW (ref 39.0–52.0)
HCT: 27 % — ABNORMAL LOW (ref 39.0–52.0)
HCT: 29 % — ABNORMAL LOW (ref 39.0–52.0)
Hemoglobin: 8.8 g/dL — ABNORMAL LOW (ref 13.0–17.0)
Hemoglobin: 8.8 g/dL — ABNORMAL LOW (ref 13.0–17.0)
Hemoglobin: 9.9 g/dL — ABNORMAL LOW (ref 13.0–17.0)
Potassium: 4.1 mEq/L (ref 3.5–5.1)
Potassium: 4.4 mEq/L (ref 3.5–5.1)

## 2011-03-08 LAB — TYPE AND SCREEN
Unit division: 0
Unit division: 0

## 2011-03-08 LAB — POCT I-STAT 3, ART BLOOD GAS (G3+)
Acid-base deficit: 4 mmol/L — ABNORMAL HIGH (ref 0.0–2.0)
Bicarbonate: 20.1 mEq/L (ref 20.0–24.0)
Bicarbonate: 21 mEq/L (ref 20.0–24.0)
Bicarbonate: 22.6 mEq/L (ref 20.0–24.0)
O2 Saturation: 99 %
Patient temperature: 36.4
TCO2: 21 mmol/L (ref 0–100)
TCO2: 22 mmol/L (ref 0–100)
pCO2 arterial: 39.3 mmHg (ref 35.0–45.0)
pCO2 arterial: 40.9 mmHg (ref 35.0–45.0)
pH, Arterial: 7.339 — ABNORMAL LOW (ref 7.350–7.450)
pH, Arterial: 7.35 (ref 7.350–7.450)
pH, Arterial: 7.362 (ref 7.350–7.450)
pH, Arterial: 7.428 (ref 7.350–7.450)
pO2, Arterial: 145 mmHg — ABNORMAL HIGH (ref 80.0–100.0)
pO2, Arterial: 324 mmHg — ABNORMAL HIGH (ref 80.0–100.0)

## 2011-03-08 LAB — COMPREHENSIVE METABOLIC PANEL
CO2: 22 mEq/L (ref 19–32)
Calcium: 10 mg/dL (ref 8.4–10.5)
Creatinine, Ser: 1.83 mg/dL — ABNORMAL HIGH (ref 0.4–1.5)
GFR calc non Af Amer: 36 mL/min — ABNORMAL LOW (ref >60)
Glucose, Bld: 101 mg/dL — ABNORMAL HIGH (ref 70–99)
Sodium: 138 mEq/L (ref 135–145)
Total Protein: 6.6 g/dL (ref 6.0–8.3)

## 2011-03-08 LAB — APTT
aPTT: 42 seconds — ABNORMAL HIGH (ref 24–37)
aPTT: 44 seconds — ABNORMAL HIGH (ref 24–37)

## 2011-03-08 LAB — CBC
HCT: 27.2 % — ABNORMAL LOW (ref 39.0–52.0)
HCT: 34.1 % — ABNORMAL LOW (ref 39.0–52.0)
Hemoglobin: 11.1 g/dL — ABNORMAL LOW (ref 13.0–17.0)
Hemoglobin: 8.4 g/dL — ABNORMAL LOW (ref 13.0–17.0)
MCH: 29.8 pg (ref 26.0–34.0)
MCH: 30 pg (ref 26.0–34.0)
MCH: 30.3 pg (ref 26.0–34.0)
MCHC: 32.6 g/dL (ref 30.0–36.0)
MCHC: 33.5 g/dL (ref 30.0–36.0)
MCV: 91.7 fL (ref 78.0–100.0)
RBC: 2.8 MIL/uL — ABNORMAL LOW (ref 4.22–5.81)
RBC: 3.72 MIL/uL — ABNORMAL LOW (ref 4.22–5.81)
RDW: 13.4 % (ref 11.5–15.5)

## 2011-03-08 LAB — PROTIME-INR
INR: 1.04 (ref 0.00–1.49)
INR: 1.54 — ABNORMAL HIGH (ref 0.00–1.49)
Prothrombin Time: 13.8 seconds (ref 11.6–15.2)
Prothrombin Time: 18.7 seconds — ABNORMAL HIGH (ref 11.6–15.2)

## 2011-03-08 LAB — POCT I-STAT, CHEM 8
Calcium, Ion: 1.27 mmol/L (ref 1.12–1.32)
Creatinine, Ser: 1.6 mg/dL — ABNORMAL HIGH (ref 0.4–1.5)
Glucose, Bld: 139 mg/dL — ABNORMAL HIGH (ref 70–99)
HCT: 30 % — ABNORMAL LOW (ref 39.0–52.0)
Hemoglobin: 10.2 g/dL — ABNORMAL LOW (ref 13.0–17.0)

## 2011-03-08 LAB — URINALYSIS, ROUTINE W REFLEX MICROSCOPIC
Bilirubin Urine: NEGATIVE
Glucose, UA: NEGATIVE mg/dL
Ketones, ur: NEGATIVE mg/dL
Protein, ur: NEGATIVE mg/dL
pH: 5.5 (ref 5.0–8.0)

## 2011-03-08 LAB — POCT I-STAT 3, VENOUS BLOOD GAS (G3P V)
Acid-base deficit: 4 mmol/L — ABNORMAL HIGH (ref 0.0–2.0)
Bicarbonate: 21.4 mEq/L (ref 20.0–24.0)
O2 Saturation: 90 %
pO2, Ven: 64 mmHg — ABNORMAL HIGH (ref 30.0–45.0)

## 2011-03-08 LAB — HEMOGLOBIN A1C
Hgb A1c MFr Bld: 4.8 % (ref <5.7)
Mean Plasma Glucose: 91 mg/dL (ref <117)

## 2011-03-08 LAB — BLOOD GAS, ARTERIAL
Acid-base deficit: 1.3 mmol/L (ref 0.0–2.0)
Drawn by: 195648
O2 Saturation: 99 %
TCO2: 23.6 mmol/L (ref 0–100)
pCO2 arterial: 35.4 mmHg (ref 35.0–45.0)
pO2, Arterial: 113 mmHg — ABNORMAL HIGH (ref 80.0–100.0)

## 2011-03-08 LAB — CREATININE, SERUM: Creatinine, Ser: 1.53 mg/dL — ABNORMAL HIGH (ref 0.4–1.5)

## 2011-03-08 LAB — URINE MICROSCOPIC-ADD ON

## 2011-03-08 LAB — GLUCOSE, CAPILLARY
Glucose-Capillary: 87 mg/dL (ref 70–99)
Glucose-Capillary: 92 mg/dL (ref 70–99)

## 2011-03-08 LAB — URINE CULTURE: Culture  Setup Time: 201110271245

## 2011-03-08 LAB — PLATELET COUNT: Platelets: 127 10*3/uL — ABNORMAL LOW (ref 150–400)

## 2011-03-10 LAB — BASIC METABOLIC PANEL
BUN: 25 mg/dL — ABNORMAL HIGH (ref 6–23)
BUN: 40 mg/dL — ABNORMAL HIGH (ref 6–23)
BUN: 77 mg/dL — ABNORMAL HIGH (ref 6–23)
CO2: 19 mEq/L (ref 19–32)
CO2: 19 mEq/L (ref 19–32)
Calcium: 8.8 mg/dL (ref 8.4–10.5)
Chloride: 113 mEq/L — ABNORMAL HIGH (ref 96–112)
Creatinine, Ser: 1.33 mg/dL (ref 0.4–1.5)
GFR calc non Af Amer: 20 mL/min — ABNORMAL LOW (ref >60)
Glucose, Bld: 109 mg/dL — ABNORMAL HIGH (ref 70–99)
Glucose, Bld: 139 mg/dL — ABNORMAL HIGH (ref 70–99)
Glucose, Bld: 91 mg/dL (ref 70–99)
Potassium: 4.6 mEq/L (ref 3.5–5.1)
Potassium: 4.7 mEq/L (ref 3.5–5.1)
Potassium: 4.9 mEq/L (ref 3.5–5.1)
Sodium: 135 mEq/L (ref 135–145)
Sodium: 138 mEq/L (ref 135–145)

## 2011-03-10 LAB — URINE MICROSCOPIC-ADD ON

## 2011-03-10 LAB — D-DIMER, QUANTITATIVE: D-Dimer, Quant: 1.36 ug/mL-FEU — ABNORMAL HIGH (ref 0.00–0.48)

## 2011-03-10 LAB — PROTIME-INR
INR: 1.05 (ref 0.00–1.49)
Prothrombin Time: 13.9 seconds (ref 11.6–15.2)

## 2011-03-10 LAB — COMPREHENSIVE METABOLIC PANEL
ALT: 18 U/L (ref 0–53)
AST: 24 U/L (ref 0–37)
Albumin: 3.4 g/dL — ABNORMAL LOW (ref 3.5–5.2)
Alkaline Phosphatase: 40 U/L (ref 39–117)
Chloride: 107 mEq/L (ref 96–112)
Potassium: 5 mEq/L (ref 3.5–5.1)
Sodium: 134 mEq/L — ABNORMAL LOW (ref 135–145)
Total Bilirubin: 0.5 mg/dL (ref 0.3–1.2)
Total Protein: 6.7 g/dL (ref 6.0–8.3)

## 2011-03-10 LAB — DIFFERENTIAL
Eosinophils Absolute: 0.1 10*3/uL (ref 0.0–0.7)
Lymphs Abs: 1 10*3/uL (ref 0.7–4.0)
Monocytes Relative: 7 % (ref 3–12)
Neutro Abs: 9.1 10*3/uL — ABNORMAL HIGH (ref 1.7–7.7)
Neutrophils Relative %: 83 % — ABNORMAL HIGH (ref 43–77)

## 2011-03-10 LAB — SODIUM, URINE, RANDOM: Sodium, Ur: <10 mEq/L

## 2011-03-10 LAB — URINALYSIS, ROUTINE W REFLEX MICROSCOPIC
Hgb urine dipstick: NEGATIVE
Nitrite: NEGATIVE
Protein, ur: NEGATIVE mg/dL
Specific Gravity, Urine: 1.009 (ref 1.005–1.030)
Urobilinogen, UA: 0.2 mg/dL (ref 0.0–1.0)

## 2011-03-10 LAB — CREATININE, URINE, RANDOM: Creatinine, Urine: 75.5 mg/dL

## 2011-03-10 LAB — CK TOTAL AND CKMB (NOT AT ARMC): Relative Index: 5.3 — ABNORMAL HIGH (ref 0.0–2.5)

## 2011-03-10 LAB — TROPONIN I: Troponin I: 0.05 ng/mL (ref 0.00–0.06)

## 2011-03-10 LAB — POCT CARDIAC MARKERS: CKMB, poc: 10.9 ng/mL (ref 1.0–8.0)

## 2011-03-10 LAB — POCT I-STAT, CHEM 8
Calcium, Ion: 1.19 mmol/L (ref 1.12–1.32)
Chloride: 107 mEq/L (ref 96–112)
Glucose, Bld: 97 mg/dL (ref 70–99)
HCT: 38 % — ABNORMAL LOW (ref 39.0–52.0)
Hemoglobin: 12.9 g/dL — ABNORMAL LOW (ref 13.0–17.0)
Potassium: 5.1 mEq/L (ref 3.5–5.1)

## 2011-03-10 LAB — CBC
Hemoglobin: 12.2 g/dL — ABNORMAL LOW (ref 13.0–17.0)
MCH: 30.8 pg (ref 26.0–34.0)
Platelets: 170 10*3/uL (ref 150–400)
RBC: 3.96 MIL/uL — ABNORMAL LOW (ref 4.22–5.81)
WBC: 11 10*3/uL — ABNORMAL HIGH (ref 4.0–10.5)

## 2011-03-10 LAB — URINE CULTURE
Colony Count: NO GROWTH
Culture: NO GROWTH

## 2011-03-10 LAB — CARDIAC PANEL(CRET KIN+CKTOT+MB+TROPI): Troponin I: 0.05 ng/mL (ref 0.00–0.06)

## 2011-03-14 NOTE — Progress Notes (Signed)
Summary: hematology referral  Phone Note Outgoing Call   Summary of Call: Pls let pt kno wthat Dr Deborah Chalk wants me to refer him to hematology for his anemia to see if he needs to start IV iron.  I will get him in with Dr Julio Alm here in [place]. Initial call taken by: Seymour Bars DO,  March 06, 2011 5:00 PM  New Problems: ANEMIA, IRON DEFICIENCY (ICD-280.9)   New Problems: ANEMIA, IRON DEFICIENCY (ICD-280.9)  Appended Document: hematology referral Wife aware of the above

## 2011-03-16 ENCOUNTER — Telehealth: Payer: Self-pay | Admitting: Family Medicine

## 2011-03-16 NOTE — Telephone Encounter (Signed)
OK to see the other MD. That is fine.

## 2011-03-16 NOTE — Telephone Encounter (Signed)
Routed to Dr.Yoo in error..Thanks, Michaelle Copas

## 2011-03-16 NOTE — Telephone Encounter (Signed)
Was this note routed to me in error?

## 2011-03-16 NOTE — Telephone Encounter (Signed)
Dr.Slatkoff's office called in reference to the referral for IV iron for this patient and wants to know if this pt. can see the other dr. And how soon does this patient need to be seen? Thanks, Michaelle Copas

## 2011-03-20 LAB — OVA AND PARASITE EXAMINATION: Ova and parasites: NONE SEEN

## 2011-03-20 LAB — COMPREHENSIVE METABOLIC PANEL
ALT: 26 U/L (ref 0–53)
AST: 31 U/L (ref 0–37)
Albumin: 3.3 g/dL — ABNORMAL LOW (ref 3.5–5.2)
CO2: 25 mEq/L (ref 19–32)
Chloride: 102 mEq/L (ref 96–112)
Creatinine, Ser: 2.31 mg/dL — ABNORMAL HIGH (ref 0.4–1.5)
GFR calc Af Amer: 33 mL/min — ABNORMAL LOW (ref >60)
GFR calc non Af Amer: 28 mL/min — ABNORMAL LOW (ref >60)
Potassium: 3.5 mEq/L (ref 3.5–5.1)
Sodium: 137 mEq/L (ref 135–145)
Total Bilirubin: 0.7 mg/dL (ref 0.3–1.2)

## 2011-03-20 LAB — DIFFERENTIAL
Eosinophils Relative: 1 % (ref 0–5)
Lymphocytes Relative: 11 % — ABNORMAL LOW (ref 12–46)
Lymphs Abs: 1.4 10*3/uL (ref 0.7–4.0)
Monocytes Absolute: 0.9 10*3/uL (ref 0.1–1.0)
Neutro Abs: 9.7 10*3/uL — ABNORMAL HIGH (ref 1.7–7.7)

## 2011-03-20 LAB — STOOL CULTURE

## 2011-03-20 LAB — BASIC METABOLIC PANEL
BUN: 29 mg/dL — ABNORMAL HIGH (ref 6–23)
CO2: 21 mEq/L (ref 19–32)
CO2: 26 mEq/L (ref 19–32)
Chloride: 110 mEq/L (ref 96–112)
Chloride: 97 mEq/L (ref 96–112)
Creatinine, Ser: 1.7 mg/dL — ABNORMAL HIGH (ref 0.4–1.5)
Creatinine, Ser: 2.7 mg/dL — ABNORMAL HIGH (ref 0.4–1.5)
GFR calc Af Amer: 28 mL/min — ABNORMAL LOW (ref >60)
GFR calc Af Amer: 48 mL/min — ABNORMAL LOW (ref >60)
Glucose, Bld: 128 mg/dL — ABNORMAL HIGH (ref 70–99)
Glucose, Bld: 91 mg/dL (ref 70–99)
Sodium: 140 mEq/L (ref 135–145)

## 2011-03-20 LAB — FECAL LACTOFERRIN, QUANT

## 2011-03-20 LAB — URINALYSIS, ROUTINE W REFLEX MICROSCOPIC
Glucose, UA: NEGATIVE mg/dL
Ketones, ur: NEGATIVE mg/dL
Leukocytes, UA: NEGATIVE
Nitrite: NEGATIVE
Specific Gravity, Urine: 1.014 (ref 1.005–1.030)
pH: 5 (ref 5.0–8.0)

## 2011-03-20 LAB — URINE CULTURE: Culture: NO GROWTH

## 2011-03-20 LAB — URINE MICROSCOPIC-ADD ON

## 2011-03-20 LAB — CBC
Platelets: 200 10*3/uL (ref 150–400)
RBC: 5.24 MIL/uL (ref 4.22–5.81)

## 2011-03-20 LAB — SODIUM: Sodium: 133 mEq/L — ABNORMAL LOW (ref 135–145)

## 2011-03-20 LAB — SODIUM, URINE, RANDOM: Sodium, Ur: <10 mEq/L

## 2011-03-20 LAB — CLOSTRIDIUM DIFFICILE EIA

## 2011-04-13 ENCOUNTER — Encounter: Payer: Self-pay | Admitting: Cardiology

## 2011-04-14 ENCOUNTER — Encounter: Payer: Self-pay | Admitting: Cardiology

## 2011-04-26 ENCOUNTER — Other Ambulatory Visit: Payer: Self-pay | Admitting: Family Medicine

## 2011-05-09 NOTE — Consult Note (Signed)
NEW PATIENT CONSULTATION   [name] [name], [name] [name]  DOB:  [birth year]                                        October 18, 2010  CHART #:  [id number]   REASON FOR CONSULTATION:  Severe three-vessel coronary artery disease.   CLINICAL HISTORY:  I was asked by Dr. Deborah Chalk to evaluate the patient  for consideration of coronary artery bypass graft surgery.  He is a 77-  year-old gentleman with a long history of cardiac disease status post  myocardial infarction at age 61 who is followed for years by Dr. Glennon Hamilton.  He had a Cardiolite scan done in 2003 that showed some  inferolateral scar with minimal ischemia and he has done well without  significant symptoms.  His ejection fraction at that time in 2003 was  noted to be 43%.  He has had some problems this summer with two  admissions for dehydration.  He was also found to be in acute renal  failure during one of those admissions with an elevated creatinine of  5.2.  His workup showed that he had significant kidney stones as well as  bladder stone.  He was seen by Dr. Ihor Gully and said that he was  told that nothing need to be done for the stones unless it started  causing symptoms.  With rehydration his creatinine dropped down to 1.7  at discharge.  He underwent a stress Cardiolite study recently due to  some symptoms of fatigue and exercise intolerance.  This showed an  ejection fraction of 33% with poor exercise tolerance.  Due to his new  symptoms and the stress Cardiolite findings, he underwent cardiac  catheterization on October 14, 2010.  This showed the left main coronary  artery had mild distal narrowing.  The LAD was a large vessel and had 70-  80% ostial stenosis with calcification.  There was a large diagonal  branch with minimal irregularity.  There is also a 90% calcified  stenosis at the bifurcation of a large septal perforator from the LAD.  This compromised majority of the anterior wall.  The left  circumflex was  a small vessel that had a small-to-moderate sized obtuse marginal  without significant disease.  The right coronary artery was totally  occluded proximally with a right atrial branch giving some collateral  flow to the distal right coronary artery which still appeared fairly  small.  The posterior descending and posterolateral branch appeared  quite small.  It is not clear whether this vessel will be suitable for  bypassing.  There is no gradient across the aortic valve.  Ejection  fraction about 30% with inferior wall akinesis and left ventricular  enlargement.   REVIEW OF SYSTEMS:  GENERAL:  He denies any fever or chills.  He has had  significant weight loss over the past year, but he has been trying to  lose weight.  He said he also lost a fair amount of weight with these  dehydration episodes.  He does report fatigue.  EYES:  Negative.  ENT:  Negative.  ENDOCRINE:  He denies diabetes and hypothyroidism.  CARDIOVASCULAR:  He denies any chest pain or pressure.  He has had some  exertional dyspnea as well as some orthopnea.  He denies PND.  He has  had no palpitations or peripheral edema.  RESPIRATORY:  He denies  cough or sputum production.  GI:  He denies nausea or vomiting.  He is status post total colectomy  for ulcerative colitis in 1984 and has an end ileostomy.  He is managing  this quite well.  GU:  He denies hematuria, but has had some dysuria and urinary  frequency.  He has history of kidney stones as mentioned above.  VASCULAR:  He denies claudication and phlebitis.  He said that he did  have a pulmonary embolism in the past, but does not remember any details  of that.  NEUROLOGICAL:  He denies any focal weakness or numbness.  Denies any  dizziness or syncope.  MUSCULOSKELETAL:  He does have a history of arthritis.  PSYCHIATRIC:  Negative.  HEMATOLOGICAL:  Negative.   ALLERGIES:  None.   MEDICATIONS:  Lasix 40 mg daily, potassium chloride 20 mEq daily,  folic  acid daily, vitamin D3 5,000 units daily, vitamin daily, fish oil daily,  Lopressor 12.5 mg b.i.d.,    DICTATION ENDS HERE   Evelene Croon, M.D.  Electronically Signed   BB/MEDQ  D:  10/18/2010  T:  10/19/2010  Job:  [id number]

## 2011-05-09 NOTE — Consult Note (Signed)
NEW PATIENT CONSULTATION   [name] [name]  DOB:  02/03/1930                                        October 18, 2010  CHART #:  [id number]   ADDENDUM   MEDICATIONS:  1. Lisinopril 20 mg daily.  2. Flomax 0.4 mg daily.  3. Aspirin daily.  4. Zocor 80 mg daily.  5. Flaxseed oil daily.  6. Lutein 20 mg daily.   PAST MEDICAL HISTORY:  Significant for hypertension and  hypercholesterolemia.  He has history of coronary disease status post  myocardial infarction at age 60.  He has a history of ulcerative colitis  status post total colectomy and ileostomy in 1984.  He has a history of  right cataract surgery recently and left cataract surgery in 2008.  He  has history of kidney stones as mentioned above.  He has history of  renal insufficiency as mentioned above.   FAMILY HISTORY:  His father died of myocardial infarction at age 22.  He  had 11 siblings and all died.  There is a strong family history of heart  disease.   SOCIAL HISTORY:  He is retired from [employer] and works part-time driving  for [employer].  He is married and lives with his wife.  He  quit smoking in 1970 and rarely drinks alcohol.   PHYSICAL EXAMINATION:  Blood pressure is 132/77, pulse 68 and regular,  respiratory rate is 18 and unlabored.  Oxygen saturation on room air is  100%.  He is a well-developed elderly white male in no distress.  HEENT  exam shows him to be normocephalic and atraumatic.  Pupils are equal and  reactive to light.  Extraocular muscles are intact.  Oropharynx is  clear.  Neck exam shows normal carotid pulses bilaterally.  There are no  bruits.  There is no adenopathy or thyromegaly.  Cardiac exam shows  regular rate and rhythm with normal S1 and S2.  There is no murmur, rub,  or gallop.  Lungs are clear.  Abdominal exam shows active bowel sounds.  The abdomen is soft and nontender.  There are no palpable masses or  organomegaly.  There is a right lower  quadrant ileostomy.  Extremity  exam shows no peripheral edema.  Pedal pulses are palpable bilaterally.  His skin is warm and dry.  Neurologic exam shows him to be alert and  oriented x3.  Motor and sensory exams are grossly normal.   IMAGING:  Carotid Doppler examination shows 40% to 59% right internal  carotid artery stenosis, felt to be in the lowest end of the range with  no evidence of hemodynamically significant stenosis.   IMPRESSION:  The patient has severe three-vessel coronary disease with  chronic right coronary artery occlusion.  He has high-grade ostial and  proximal left anterior descending stenosis that is putting his  anterolateral and septal wall at high risk for ischemia and infarction.  I agree that coronary artery bypass graft surgery is the best treatment  to prevent further ischemia and infarction and improves quality of life.  It is not clear whether his right coronary artery will be graftable.  I  discussed the operative procedure with he and his wife including  alternatives, benefits, and risks including but not limited to bleeding,  blood transfusion, infection, stroke, myocardial infarction, renal  failure, other organ dysfunction,  and death.  He understands and would  like to proceed with surgery.  We will schedule this for Monday on  October 24 2010.   [name] [name], M.D.  Electronically Signed   BB/MEDQ  D:  10/18/2010  T:  10/19/2010  Job:  [id number]   cc:   [name] [name]. [name] [name], M.D.

## 2011-05-09 NOTE — Assessment & Plan Note (Signed)
OFFICE VISIT   [name] [name], AMAN  DOB:  [birth year]                                        November 15, 2010  CHART #:  [id number]   HISTORY:  The patient returned to my office today for followup status  post coronary artery bypass graft surgery x3 on October 24, 2010.  His  postoperative course was complicated only by atrial fibrillation.  He  was discharged home on amiodarone and Coumadin.  Since discharge he said  he has been feeling well overall.  He has not required any pain  medication.  He is walking daily without chest pain or shortness of  breath.  Dr. Roger Shelter has been following him in the office and  managing his anticoagulation.   PHYSICAL EXAMINATION:  Blood pressure 131/81, pulse is 68 and regular,  respiratory rate 16 and unlabored.  Oxygen saturation on room air is  98%.  He looks well.  Cardiac exam shows regular rate and rhythm with  normal heart sounds.  Lung exam reveals decreased breath sounds at the  left base.  Chest incision is healing well and sternum is stable.  His  right leg incision is healing well and there is mild bilateral ankle  edema.   Followup chest x-ray today shows a small-to-moderate sized left pleural  effusion with left basilar atelectasis.  Comparison to his x-ray on  October 28, 2010, shows that this left pleural effusion is somewhat  larger.   MEDICATIONS:  Folic acid, vitamin D3, multivitamin, fish oil, Lopressor  25 mg b.i.d., Flomax 0.4 mg daily, aspirin 81 mg daily, Zocor 10 mg  q.h.s., flaxseed oil, lutein, amiodarone 200 mg b.i.d., Coumadin.   IMPRESSION:  Overall, the patient is making a good recovery following  surgery.  He does have some mild ankle edema and a slightly larger left  pleural effusion.  He is no longer taking Lasix.  I will give him a 10-  day course of Lasix 40 mg daily and potassium chloride 20 mEq daily,  which should help resolve this effusion and lower extremity edema.  He  is  asymptomatic.  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  I told him he could start doing that on  Monday December 5.  I will plan to see him back in about 3 weeks for  follow up with a chest x-ray.  He will continue to follow up with Dr.  Deborah Chalk for his cardiology care.   Evelene Croon, M.D.  Electronically Signed   BB/MEDQ  D:  11/15/2010  T:  11/15/2010  Job:  [id number]

## 2011-05-15 ENCOUNTER — Encounter: Payer: Self-pay | Admitting: Internal Medicine

## 2011-08-07 ENCOUNTER — Other Ambulatory Visit: Payer: Self-pay | Admitting: Family Medicine

## 2011-08-09 ENCOUNTER — Telehealth: Payer: Self-pay | Admitting: Nurse Practitioner

## 2011-08-09 DIAGNOSIS — I119 Hypertensive heart disease without heart failure: Secondary | ICD-10-CM

## 2011-08-09 MED ORDER — METOPROLOL TARTRATE 100 MG PO TABS: ORAL_TABLET | ORAL | Status: DC

## 2011-08-09 NOTE — Telephone Encounter (Signed)
Pt needs refill Metoprolol Tartrate 100 mg, pt states he breaks them in 2 because he saves money, pt uses [facility] in [place] [phone], please call into [facility] and call pt back to confirm this has been done, pt has 5 days worth after Sunday, chart in box

## 2011-08-21 ENCOUNTER — Encounter: Payer: Self-pay | Admitting: Nurse Practitioner

## 2011-08-30 ENCOUNTER — Encounter: Payer: Self-pay | Admitting: Cardiology

## 2011-09-04 ENCOUNTER — Ambulatory Visit: Payer: Medicare Other | Admitting: Nurse Practitioner

## 2011-09-04 ENCOUNTER — Encounter: Payer: Self-pay | Admitting: Cardiology

## 2011-09-04 ENCOUNTER — Ambulatory Visit (INDEPENDENT_AMBULATORY_CARE_PROVIDER_SITE_OTHER): Payer: Medicare Other | Admitting: Cardiology

## 2011-09-04 ENCOUNTER — Telehealth: Payer: Self-pay | Admitting: Family Medicine

## 2011-09-04 DIAGNOSIS — E785 Hyperlipidemia, unspecified: Secondary | ICD-10-CM

## 2011-09-04 DIAGNOSIS — I509 Heart failure, unspecified: Secondary | ICD-10-CM

## 2011-09-04 DIAGNOSIS — I4891 Unspecified atrial fibrillation: Secondary | ICD-10-CM

## 2011-09-04 DIAGNOSIS — R0609 Other forms of dyspnea: Secondary | ICD-10-CM

## 2011-09-04 DIAGNOSIS — I2510 Atherosclerotic heart disease of native coronary artery without angina pectoris: Secondary | ICD-10-CM

## 2011-09-04 DIAGNOSIS — I25810 Atherosclerosis of coronary artery bypass graft(s) without angina pectoris: Secondary | ICD-10-CM

## 2011-09-04 DIAGNOSIS — I5032 Chronic diastolic (congestive) heart failure: Secondary | ICD-10-CM | POA: Insufficient documentation

## 2011-09-04 LAB — CBC WITH DIFFERENTIAL/PLATELET
Basophils Relative: 0.4 % (ref 0.0–3.0)
Eosinophils Relative: 1.5 % (ref 0.0–5.0)
Hemoglobin: 12.2 g/dL — ABNORMAL LOW (ref 13.0–17.0)
Lymphocytes Relative: 11.1 % — ABNORMAL LOW (ref 12.0–46.0)
MCV: 95.1 fl (ref 78.0–100.0)
Monocytes Absolute: 0.6 10*3/uL (ref 0.1–1.0)
Neutro Abs: 7 10*3/uL (ref 1.4–7.7)
Neutrophils Relative %: 80 % — ABNORMAL HIGH (ref 43.0–77.0)
RBC: 3.93 Mil/uL — ABNORMAL LOW (ref 4.22–5.81)
WBC: 8.7 10*3/uL (ref 4.5–10.5)

## 2011-09-04 NOTE — Telephone Encounter (Signed)
Call pt: I received some information from the heart and wellness program. He screened + for sleep apnea. Would he like me to schedule him for testing?

## 2011-09-04 NOTE — Progress Notes (Signed)
PCP: Dr. Cathey Endow  77 yo with history of CAD s/p CABG, ulcerative colitis s/p colectomy, and diastolic CHF presents for cardiology followup.  [name] [name] has been seen by Dr. Deborah Chalk in the past and is seen by me for the first time today.  [name] [name] had CABG in 10/11 complicated by post-operative atrial fibrillation.  EF was 50% on most recent echo in 1/12.  [name] [name] was started on amiodarone while in the hospital after CABG and this was continued due to risk of further atrial fibrillation in the setting of significantly enlarged left atrium.  [name] [name] was admitted to the hospital in [place] in 7/12 with a diastolic CHF exacerbation and was diuresed.   [name] [name] continues on po Lasix.  [name] [name] has been doing cardiac rehab at [facility].  [name] [name] saw a hematologist for anemia of uncertain etiology and had erythropoietin shots.   [name] [name] has been doing fairly well recently.  Now that [name] [name] is taking Lasix, [name] [name] denies exertional dyspnea.  [name] [name] walks > 1 mile/day on days [name] [name] is not doing cardiac rehab.  [name] [name] is able to walk up hills now that would make him very short of breath before CABG.  No chest pain.  No tachypalpitations.    ECG: NSR, RBBB, inferior Qs  PMH: 1. Paroxysmal atrial fibrillation: Noted only post-op CABG in 10/11.  Was not put on coumadin.  2. Knee osteoarthritis 3. Anemia of uncertain etiology: had erythropoietin shots for a period of time.  4. CAD: MI at age 56.  Had CABG in 10/11 with LIMA-LAD, SVG-D, and SVG-RCA.  5. Diastolic CHF: Echo (1/12) with EF 50%, inferior and inferoseptal hypokinesis, mild LV hypertrophy, moderate MR, severe left atrial enlargement.  6. Ulcerative colitis: s/p total colectomy with ileostomy.  7. HTN 8. Hyperlipidemia 9. Nephrolithiasis 10. H/o ARF in the setting of dehydration.   SH: Retired from [employer].  Married, lives in [place]. Stopped smoking in 1970.    FH: Strong history of CAD.  Father with MI at 54.  All siblings (11) have now passed away, many with heart disease.   ROS: All systems  reviewed and negative except as per HPI.   Current Outpatient Prescriptions  Medication Sig Dispense Refill  . aspirin 81 MG tablet Take 81 mg by mouth daily.        . Cholecalciferol (VITAMIN D-3 PO) Take by mouth daily.        . ferrous sulfate 325 (65 FE) MG tablet Take 325 mg by mouth daily with breakfast.        . fish oil-omega-3 fatty acids 1000 MG capsule Take 2 g by mouth daily.        . Flaxseed, Linseed, (FLAX SEED OIL PO) Take by mouth daily.        . folic acid (FOLVITE) 400 MCG tablet Take 400 mcg by mouth daily.        . furosemide (LASIX) 40 MG tablet Take 40 mg by mouth daily.       [name] lisinopril (PRINIVIL,ZESTRIL) 10 MG tablet Take 10 mg by mouth daily.        . metoprolol (LOPRESSOR) 50 MG tablet Take 25 mg by mouth 2 (two) times daily.        . Multiple Vitamin (MULTIVITAMIN) tablet Take 1 tablet by mouth daily.        . Multiple Vitamins-Minerals (ICAPS PO) Take by mouth 2 (two) times daily.        . multivitamin-lutein (OCUVITE-LUTEIN) CAPS Take 1 capsule by mouth daily.        [name]  pantoprazole (PROTONIX) 40 MG tablet Take 40 mg by mouth daily.        . potassium chloride SA (K-DUR,KLOR-CON) 20 MEQ tablet Take 20 mEq by mouth daily.       . simvastatin (ZOCOR) 40 MG tablet Take 20 mg by mouth at bedtime.        . Tamsulosin HCl (FLOMAX) 0.4 MG CAPS Take 0.4 mg by mouth daily.          BP 130/82  Pulse 61  Resp 16  Ht 5\' 4"  (1.626 m)  Wt 169 lb (76.658 kg)  BMI 29.01 kg/m2 General: NAD Neck: No JVD, no thyromegaly or thyroid nodule.  Lungs: Clear to auscultation bilaterally with normal respiratory effort. CV: Nondisplaced PMI.  Heart regular S1/S2, no S3/S4, no murmur.  No peripheral edema.  No carotid bruit.  Normal pedal pulses.  Abdomen: Soft, nontender, no hepatosplenomegaly, no distention.  Neurologic: Alert and oriented x 3.  Psych: Normal affect. Extremities: No clubbing or cyanosis.

## 2011-09-04 NOTE — Assessment & Plan Note (Signed)
Atrial fibrillation has only been documented post-op after CABG.  Patient has been on amiodarone since 10/11.  He was kept on amiodarone due to the significantly dilated left atrium.  He was not started on coumadin.  I am going to let him stop amiodarone today.  Will follow closely for atrial fibrillation occurrence.

## 2011-09-04 NOTE — Assessment & Plan Note (Signed)
Patient appears euvolemic with NYHA class II symptoms.  EF 50% on 1/12 echo.  Will continue current Lasix and KCl with BMET/BNP today.

## 2011-09-04 NOTE — Patient Instructions (Addendum)
Stop Amiodarone.  Your physician recommends that you return for a FASTING lipid profile/liver profile/BMP/BNP/CBC 414.05  427.31   Your physician recommends that you schedule a follow-up appointment in: 2 months with Dr [name].

## 2011-09-04 NOTE — Assessment & Plan Note (Signed)
Stable post-CABG, doing cardiac rehab.  Continue ASA 81, lisinopril, metoprolol, and statin.                                                                                                                                                                                                    

## 2011-09-04 NOTE — Assessment & Plan Note (Signed)
Check lipids/LFTs today, goal LDL < 70.  

## 2011-09-05 ENCOUNTER — Encounter: Payer: Self-pay | Admitting: Family Medicine

## 2011-09-05 LAB — HEPATIC FUNCTION PANEL
ALT: 19 U/L (ref 0–53)
AST: 29 U/L (ref 0–37)
Albumin: 3.8 g/dL (ref 3.5–5.2)
Alkaline Phosphatase: 59 U/L (ref 39–117)
Bilirubin, Direct: 0.2 mg/dL (ref 0.0–0.3)
Total Protein: 6.8 g/dL (ref 6.0–8.3)

## 2011-09-05 LAB — LIPID PANEL
HDL: 53.5 mg/dL (ref >39.00)
Total CHOL/HDL Ratio: 3

## 2011-09-05 LAB — BASIC METABOLIC PANEL
CO2: 27 mEq/L (ref 19–32)
Calcium: 9.8 mg/dL (ref 8.4–10.5)
Potassium: 4.2 mEq/L (ref 3.5–5.1)
Sodium: 143 mEq/L (ref 135–145)

## 2011-09-05 NOTE — Telephone Encounter (Signed)
Pt does not wish to have additional testing at this time.

## 2011-09-06 ENCOUNTER — Telehealth: Payer: Self-pay | Admitting: *Deleted

## 2011-09-06 NOTE — Telephone Encounter (Signed)
Dr [name] reviewed monitor report from Junious Dresser [phone] Cardiac Rehab at [facility] 09/04/11. Pt had ventricular ectopy.(The report is on Dr Alford Highland cart). Dr [name] recommended increasing metoprolol to 75mg  bid if pt is taking 50mg  bid since amiodarone was discontinued at OV with Dr [name]  09/04/11. LMTCB for pt.

## 2011-09-06 NOTE — Telephone Encounter (Signed)
After review pt should be on lopressor 25mg  twice and day. Dr [name] recommends increasing lopressor to 37.5mg  bid since stopping amiodarone 09/04/11.

## 2011-09-07 NOTE — Telephone Encounter (Signed)
Pt returning call to [facility]. Please call pt back.

## 2011-09-07 NOTE — Telephone Encounter (Signed)
LMTCB Debbie Marlon Vonruden RN  

## 2011-09-08 NOTE — Telephone Encounter (Signed)
[place] medication bottles with him. He has his medication put in a weekly medication dosage container.   He states he takes 1/2 of a lopressor 50mg  tablet twice a day. He does not think he can take 3/4 of a tablet and he just got his medication filled and does not want to get a new prescription. I will review with Dr [name].

## 2011-09-08 NOTE — Telephone Encounter (Signed)
I reviewed with Dr [name] he recommended increase lopressor to 50mg  bid. I also discussed with pt and he is aware of Dr Alford Highland recommendation.

## 2011-09-29 ENCOUNTER — Encounter: Payer: Self-pay | Admitting: Cardiology

## 2011-11-07 ENCOUNTER — Ambulatory Visit (INDEPENDENT_AMBULATORY_CARE_PROVIDER_SITE_OTHER): Payer: Medicare Other | Admitting: Cardiology

## 2011-11-07 ENCOUNTER — Encounter: Payer: Self-pay | Admitting: Cardiology

## 2011-11-07 DIAGNOSIS — I25810 Atherosclerosis of coronary artery bypass graft(s) without angina pectoris: Secondary | ICD-10-CM

## 2011-11-07 DIAGNOSIS — I5032 Chronic diastolic (congestive) heart failure: Secondary | ICD-10-CM

## 2011-11-07 DIAGNOSIS — I4891 Unspecified atrial fibrillation: Secondary | ICD-10-CM

## 2011-11-07 DIAGNOSIS — E785 Hyperlipidemia, unspecified: Secondary | ICD-10-CM

## 2011-11-07 DIAGNOSIS — I509 Heart failure, unspecified: Secondary | ICD-10-CM

## 2011-11-07 DIAGNOSIS — I2510 Atherosclerotic heart disease of native coronary artery without angina pectoris: Secondary | ICD-10-CM

## 2011-11-07 LAB — BASIC METABOLIC PANEL
BUN: 24 mg/dL — ABNORMAL HIGH (ref 6–23)
Calcium: 9.9 mg/dL (ref 8.4–10.5)
Chloride: 106 mEq/L (ref 96–112)
Creatinine, Ser: 1.7 mg/dL — ABNORMAL HIGH (ref 0.4–1.5)

## 2011-11-07 LAB — CBC WITH DIFFERENTIAL/PLATELET
Basophils Absolute: 0 10*3/uL (ref 0.0–0.1)
HCT: 38.1 % — ABNORMAL LOW (ref 39.0–52.0)
Lymphs Abs: 1.2 10*3/uL (ref 0.7–4.0)
MCV: 96.2 fl (ref 78.0–100.0)
Monocytes Absolute: 0.6 10*3/uL (ref 0.1–1.0)
Monocytes Relative: 5.6 % (ref 3.0–12.0)
Neutrophils Relative %: 80.4 % — ABNORMAL HIGH (ref 43.0–77.0)
Platelets: 142 10*3/uL — ABNORMAL LOW (ref 150.0–400.0)
RDW: 14.5 % (ref 11.5–14.6)
WBC: 10.2 10*3/uL (ref 4.5–10.5)

## 2011-11-07 LAB — BRAIN NATRIURETIC PEPTIDE: Pro B Natriuretic peptide (BNP): 837 pg/mL — ABNORMAL HIGH (ref 0.0–100.0)

## 2011-11-07 NOTE — Assessment & Plan Note (Signed)
Patient looks euvolemic by exam.  He has improved NYHA class II symptoms.  I am going to continue the current Lasix dosing.  However, given CKD, I will get a BMET today to follow creatinine.  I will also check BNP.

## 2011-11-07 NOTE — Assessment & Plan Note (Signed)
No tachypalpitations.  No atrial fibrillation has been documented since the post-op period.  He has been off amiodarone since last appointment.  If atrial fibrillation recurs, he will need anticoagulation.

## 2011-11-07 NOTE — Assessment & Plan Note (Signed)
LDL close to goal (< 70) when last checked.  Will repeat lipids in 4 months.

## 2011-11-07 NOTE — Patient Instructions (Signed)
Your physician recommends that you have lab today--BMP/BNP/CBC 414.05  428.32   Your physician recommends that you return for a FASTING lipid profile/ BMET in 4 months when you see Dr [name].-- 414.05  428.32   Your physician recommends that you schedule a follow-up appointment in: 4 months with Dr [name].

## 2011-11-07 NOTE — Assessment & Plan Note (Signed)
Stable post-CABG, doing cardiac rehab.  Continue ASA 81, lisinopril, metoprolol, and statin.

## 2011-11-07 NOTE — Progress Notes (Signed)
PCP: Dr. Cathey Endow  77 yo with history of CAD s/p CABG, ulcerative colitis s/p colectomy, and diastolic CHF presents for cardiology followup.  He had CABG in 10/11 complicated by post-operative atrial fibrillation.  EF was 50% on most recent echo in 1/12.  He was started on amiodarone while in the hospital after CABG and this was continued due to risk of further atrial fibrillation in the setting of significantly enlarged left atrium.  He was admitted to the hospital in [place] in 7/12 with a diastolic CHF exacerbation and was diuresed.   He continues on po Lasix.  He has been doing cardiac rehab at [facility].  When I last saw him, I had him stop amiodarone given no atrial fibrillation recurrence (so far, has only had post-op atrial fibirillation from 10/11 documented).    Late, he has been feeling well. No chest pain.  No tachypalpitations.  He does not have significant dyspnea doing the treadmill and using the stationary bike at cardiac rehab. His endurance seems to have improved considerably.  Good energy level.  Weight is down 4 lbs since last appointment.    ECG: NSR, RBBB, LAFB, old inferior MI  Labs (9/12): LDL 81, HDL 54, K 4.2, creatinine 1.9, BNP 1065  PMH: 1. Paroxysmal atrial fibrillation: Noted only post-op CABG in 10/11.  Was not put on coumadin.  Initially on amiodarone but this was stopped.  2. Knee osteoarthritis 3. Anemia of uncertain etiology: had erythropoietin shots for a period of time.  4. CAD: MI at age 46.  Had CABG in 10/11 with LIMA-LAD, SVG-D, and SVG-RCA.  5. Diastolic CHF: Echo (1/12) with EF 50%, inferior and inferoseptal hypokinesis, mild LV hypertrophy, moderate MR, severe left atrial enlargement.  6. Ulcerative colitis: s/p total colectomy with ileostomy.  7. HTN 8. Hyperlipidemia 9. Nephrolithiasis 10. H/o ARF in the setting of dehydration.   SH: Retired from [employer].  Married, lives in [place]. Stopped smoking in 1970.    FH: Strong history of  CAD.  Father with MI at 41.  All siblings (11) have now passed away, many with heart disease.   ROS: All systems reviewed and negative except as per HPI.   Current Outpatient Prescriptions  Medication Sig Dispense Refill  . aspirin 81 MG tablet Take 81 mg by mouth daily.        . Cholecalciferol (VITAMIN D-3 PO) Take by mouth daily.        . ferrous sulfate 325 (65 FE) MG tablet Take 325 mg by mouth daily with breakfast.        . fish oil-omega-3 fatty acids 1000 MG capsule Take 2 g by mouth daily.        . Flaxseed, Linseed, (FLAX SEED OIL PO) Take by mouth daily.        . folic acid (FOLVITE) 400 MCG tablet Take 400 mcg by mouth daily.        . furosemide (LASIX) 40 MG tablet Take 40 mg by mouth daily.       [name] lisinopril (PRINIVIL,ZESTRIL) 10 MG tablet Take 10 mg by mouth daily.        . metoprolol (LOPRESSOR) 50 MG tablet Take 1 tablet (50 mg total) by mouth 2 (two) times daily.      . Multiple Vitamin (MULTIVITAMIN) tablet Take 1 tablet by mouth daily.        . Multiple Vitamins-Minerals (ICAPS PO) Take by mouth 2 (two) times daily.        . multivitamin-lutein (  OCUVITE-LUTEIN) CAPS Take 1 capsule by mouth daily.        . pantoprazole (PROTONIX) 40 MG tablet Take 40 mg by mouth daily.        . potassium chloride SA (K-DUR,KLOR-CON) 20 MEQ tablet Take 20 mEq by mouth daily.       . simvastatin (ZOCOR) 40 MG tablet Take 20 mg by mouth at bedtime.        . Tamsulosin HCl (FLOMAX) 0.4 MG CAPS Take 0.4 mg by mouth daily.          BP 128/80  Pulse 60  Ht 5\' 4"  (1.626 m)  Wt 74.844 kg (165 lb)  BMI 28.32 kg/m2 General: NAD Neck: No JVD, no thyromegaly or thyroid nodule.  Lungs: Clear to auscultation bilaterally with normal respiratory effort. CV: Nondisplaced PMI.  Heart regular S1/S2, no S3/S4, no murmur.  No peripheral edema.  No carotid bruit.  Normal pedal pulses.  Abdomen: Soft, nontender, no hepatosplenomegaly, no distention.  Neurologic: Alert and oriented x 3.  Psych: Normal  affect. Extremities: No clubbing or cyanosis.

## 2011-11-08 ENCOUNTER — Telehealth: Payer: Self-pay | Admitting: Cardiology

## 2011-11-08 MED ORDER — SIMVASTATIN 80 MG PO TABS: 40.0000 mg | ORAL_TABLET | Freq: Every day | ORAL | Status: DC

## 2011-11-08 MED ORDER — LISINOPRIL 40 MG PO TABS: 20.0000 mg | ORAL_TABLET | Freq: Every day | ORAL | Status: DC

## 2011-11-08 NOTE — Telephone Encounter (Signed)
I talked with pt about recent lab results 

## 2011-11-08 NOTE — Telephone Encounter (Signed)
Simvastatin gets 80mg  and cuts in half 90 day supply, lisinopril gets 40mg  cuts in half 90 day supply , uses [facility] pt said he is out, only has enough until sunday

## 2011-11-08 NOTE — Telephone Encounter (Signed)
Pt calling wanting to know results of pt blood test. Please call pt back.

## 2011-11-09 ENCOUNTER — Telehealth: Payer: Self-pay | Admitting: Cardiology

## 2011-11-09 ENCOUNTER — Other Ambulatory Visit: Payer: Self-pay | Admitting: Cardiology

## 2011-11-09 NOTE — Telephone Encounter (Signed)
Need patient's labs from November 13th. Unable to read previous copy. Please lighten and resend to [phone].

## 2011-11-09 NOTE — Telephone Encounter (Signed)
Pt calling stating that he needs new RX's of potassium and furosemide quantity of 45.

## 2011-11-10 MED ORDER — FUROSEMIDE 40 MG PO TABS: 40.0000 mg | ORAL_TABLET | Freq: Every day | ORAL | Status: DC

## 2011-11-10 MED ORDER — POTASSIUM CHLORIDE CRYS ER 20 MEQ PO TBCR: 20.0000 meq | EXTENDED_RELEASE_TABLET | Freq: Every day | ORAL | Status: DC

## 2011-11-14 ENCOUNTER — Other Ambulatory Visit: Payer: Self-pay | Admitting: *Deleted

## 2011-11-14 MED ORDER — FUROSEMIDE 40 MG PO TABS: 40.0000 mg | ORAL_TABLET | Freq: Every day | ORAL | Status: DC

## 2011-11-14 MED ORDER — POTASSIUM CHLORIDE CRYS ER 20 MEQ PO TBCR: 20.0000 meq | EXTENDED_RELEASE_TABLET | Freq: Every day | ORAL | Status: DC

## 2011-12-12 ENCOUNTER — Telehealth: Payer: Self-pay | Admitting: Family Medicine

## 2011-12-12 NOTE — Telephone Encounter (Signed)
I reviewed document from the heart and wellness center. Everything looks good. BP is borderline but I know he is followed by cardiology.

## 2011-12-22 ENCOUNTER — Encounter: Payer: Self-pay | Admitting: Family Medicine

## 2011-12-27 NOTE — Telephone Encounter (Signed)
Pt.notified

## 2012-01-11 ENCOUNTER — Telehealth: Payer: Self-pay | Admitting: Cardiology

## 2012-01-11 MED ORDER — LISINOPRIL 40 MG PO TABS: 20.0000 mg | ORAL_TABLET | Freq: Every day | ORAL | Status: DC

## 2012-01-11 MED ORDER — METOPROLOL TARTRATE 100 MG PO TABS: ORAL_TABLET | ORAL | Status: DC

## 2012-01-11 MED ORDER — FUROSEMIDE 40 MG PO TABS: 40.0000 mg | ORAL_TABLET | Freq: Every day | ORAL | Status: DC

## 2012-01-11 NOTE — Telephone Encounter (Signed)
I talked with pt about prescription refills.

## 2012-01-11 NOTE — Telephone Encounter (Signed)
FU Call: Pt returning call to Anne. Please return pt call to discuss further.  

## 2012-01-11 NOTE — Telephone Encounter (Signed)
I mailed these to pt's home address.

## 2012-01-11 NOTE — Telephone Encounter (Signed)
At pt's request mailed prescriptions for Lisinopril 40mg  #100  (1/2) daily. Lasix 40mg  #100 (1) daily, and metoprolol tartrate 100mg  #100 (1/2) twice a day.

## 2012-03-07 ENCOUNTER — Ambulatory Visit (INDEPENDENT_AMBULATORY_CARE_PROVIDER_SITE_OTHER): Payer: Medicare Other | Admitting: Cardiology

## 2012-03-07 ENCOUNTER — Encounter: Payer: Self-pay | Admitting: Cardiology

## 2012-03-07 ENCOUNTER — Other Ambulatory Visit: Payer: Medicare Other

## 2012-03-07 VITALS — BP 122/76 | HR 56 | Ht 64.0 in | Wt 167.8 lb

## 2012-03-07 DIAGNOSIS — I25810 Atherosclerosis of coronary artery bypass graft(s) without angina pectoris: Secondary | ICD-10-CM

## 2012-03-07 DIAGNOSIS — I059 Rheumatic mitral valve disease, unspecified: Secondary | ICD-10-CM

## 2012-03-07 DIAGNOSIS — I509 Heart failure, unspecified: Secondary | ICD-10-CM

## 2012-03-07 DIAGNOSIS — I340 Nonrheumatic mitral (valve) insufficiency: Secondary | ICD-10-CM | POA: Insufficient documentation

## 2012-03-07 DIAGNOSIS — I2510 Atherosclerotic heart disease of native coronary artery without angina pectoris: Secondary | ICD-10-CM

## 2012-03-07 DIAGNOSIS — I4891 Unspecified atrial fibrillation: Secondary | ICD-10-CM

## 2012-03-07 DIAGNOSIS — I5032 Chronic diastolic (congestive) heart failure: Secondary | ICD-10-CM

## 2012-03-07 DIAGNOSIS — E785 Hyperlipidemia, unspecified: Secondary | ICD-10-CM

## 2012-03-07 MED ORDER — ATORVASTATIN CALCIUM 40 MG PO TABS: 40.0000 mg | ORAL_TABLET | Freq: Every day | ORAL | Status: DC

## 2012-03-07 NOTE — Patient Instructions (Signed)
Stop simvastatin.  Start atorvastatin (lipitor) 40mg  daily.  Your physician recommends that you return for a FASTING lipid profile /liver profile/BMET in 62months--414.05  428.32   Your physician wants you to follow-up in: 6 months with Dr [name]. (September 2013). You will receive a reminder letter in the mail two months in advance. If you don't receive a letter, please call our office to schedule the follow-up appointment.

## 2012-03-07 NOTE — Assessment & Plan Note (Signed)
No tachypalpitations.  No atrial fibrillation has been documented since the CABG post-op period.  Now off amiodarone.  If atrial fibrillation recurs, he will need anticoagulation.

## 2012-03-07 NOTE — Assessment & Plan Note (Addendum)
Goal LDL < 70.  LDL was too high in 1/13.  I will stop simvastatin and have him start atorvastatin 40 mg daily.

## 2012-03-07 NOTE — Assessment & Plan Note (Signed)
Patient looks euvolemic by exam.  He has NYHA class II symptoms.  I am going to continue the current Lasix dosing.  Creatinine has been stably elevated, last labs in 1/13.  He will be due for lipids in 2 months, will repeat creatinine also.

## 2012-03-07 NOTE — Assessment & Plan Note (Signed)
Moderate by echo last year.  Has MR murmur.  This will increase risk of recurrent atrial fibrillation.  Will plan to get an echo next year to follow.

## 2012-03-07 NOTE — Assessment & Plan Note (Signed)
Stable post-CABG, doing cardiac rehab.  Continue ASA 81, lisinopril, metoprolol, and statin.                                                                                                                                                                                                    

## 2012-03-07 NOTE — Progress Notes (Signed)
PCP: Dr. Linford Arnold  78 yo with history of CAD s/p CABG, ulcerative colitis s/p colectomy, and diastolic CHF presents for cardiology followup.  He had CABG in 10/11 complicated by post-operative atrial fibrillation.  EF was 50% on most recent echo in 1/12.  He was started on amiodarone while in the hospital after CABG and this was continued due to risk of further atrial fibrillation in the setting of significantly enlarged left atrium.  He was admitted to the hospital in [place] in 7/12 with a diastolic CHF exacerbation and was diuresed.   He continues on po Lasix.   I had him stop amiodarone in 2012 given no atrial fibrillation recurrence (so far, has only had post-op atrial fibirillation from 10/11 documented).    Lately, he has been feeling well. No chest pain.  No tachypalpitations.  He walks for 2 miles on a treadmill or outdoors on most days without exertional dyspnea.  He can climb a flight of steps without trouble.  He completed cardiac rehab.     ECG: NSR, RBBB, old inferior MI  Labs (9/12): LDL 81, HDL 54, K 4.2, creatinine 1.9, BNP 1065 Labs (11/12): K 4.1, creatinine 1.7, BNP 837 Labs (1/13): K 3.7, creatinine 1.6, HDL 52, LDL 100  PMH: 1. Paroxysmal atrial fibrillation: Noted only post-op CABG in 10/11.  Was not put on coumadin.  Initially on amiodarone but this was stopped.  2. Knee osteoarthritis 3. Anemia of uncertain etiology: had erythropoietin shots for a period of time.  4. CAD: MI at age 52.  Had CABG in 10/11 with LIMA-LAD, SVG-D, and SVG-RCA.  5. Diastolic CHF: Echo (1/12) with EF 50%, inferior and inferoseptal hypokinesis, mild LV hypertrophy, moderate MR, severe left atrial enlargement.  6. Ulcerative colitis: s/p total colectomy with ileostomy.  7. HTN 8. Hyperlipidemia 9. Nephrolithiasis 10. CKD: H/o ARF in the setting of dehydration.   SH: Retired from [employer].  Married, lives in [place]. Stopped smoking in 1970.    FH: Strong history of CAD.  Father with  MI at 44.  All siblings (11) have now passed away, many with heart disease.   ROS: All systems reviewed and negative except as per HPI.   Current Outpatient Prescriptions  Medication Sig Dispense Refill  . acetaminophen (TYLENOL EX ST ARTHRITIS PAIN) 500 MG tablet Take 500 mg by mouth as needed.      [name] aspirin 81 MG tablet Take 81 mg by mouth daily.        . Cholecalciferol (VITAMIN D-3 PO) Take by mouth daily.        . ferrous sulfate 325 (65 FE) MG tablet Take 325 mg by mouth daily with breakfast.        . fish oil-omega-3 fatty acids 1000 MG capsule Take 2 g by mouth daily.        . Flaxseed, Linseed, (FLAX SEED OIL PO) Take by mouth daily.        . folic acid (FOLVITE) 400 MCG tablet Take 100 mcg by mouth daily.       . furosemide (LASIX) 40 MG tablet Take 1 tablet (40 mg total) by mouth daily.  100 tablet  3  . lisinopril (PRINIVIL,ZESTRIL) 40 MG tablet Take 0.5 tablets (20 mg total) by mouth daily.  100 tablet  3  . metoprolol (LOPRESSOR) 100 MG tablet Take 1/2 tablet twice a day  100 tablet  3  . Multiple Vitamin (MULTIVITAMIN) tablet Take 1 tablet by mouth daily.        [name]  Multiple Vitamins-Minerals (ICAPS PO) Take by mouth 2 (two) times daily.        . multivitamin-lutein (OCUVITE-LUTEIN) CAPS Take 1 capsule by mouth daily.        . potassium chloride SA (K-DUR,KLOR-CON) 20 MEQ tablet Take 1 tablet (20 mEq total) by mouth daily.  30 tablet  6  . Tamsulosin HCl (FLOMAX) 0.4 MG CAPS Take 0.4 mg by mouth daily.        [name] atorvastatin (LIPITOR) 40 MG tablet Take 1 tablet (40 mg total) by mouth daily.  120 tablet  3    BP 122/76  Pulse 56  Ht 5\' 4"  (1.626 m)  Wt 167 lb 12.8 oz (76.114 kg)  BMI 28.80 kg/m2 General: NAD Neck: No JVD, no thyromegaly or thyroid nodule.  Lungs: Clear to auscultation bilaterally with normal respiratory effort. CV: Nondisplaced PMI.  Heart regular S1/S2, no S3/S4, 2/6 HSM at apex. Trace ankle edema.  No carotid bruit.  Normal pedal pulses.  Abdomen: Soft,  nontender, no hepatosplenomegaly, no distention.  Neurologic: Alert and oriented x 3.  Psych: Normal affect. Extremities: No clubbing or cyanosis.

## 2012-03-28 ENCOUNTER — Telehealth: Payer: Self-pay | Admitting: Cardiology

## 2012-03-28 NOTE — Telephone Encounter (Signed)
busy

## 2012-03-28 NOTE — Telephone Encounter (Signed)
OK if he remains stable symptomatically.

## 2012-03-28 NOTE — Telephone Encounter (Signed)
Please return call to patient at [phone]   Patient need surgical clearance documentation from 3/14 appnt for upcoming Kidney Stone procedure.

## 2012-03-28 NOTE — Telephone Encounter (Signed)
Spoke with pt. Pt requesting clearance from Dr [name] for procedure by Dr [name] to remove bladder stone. I will forward to Dr [name] for review.

## 2012-03-29 NOTE — Telephone Encounter (Signed)
Request for surgical clearance form completed by Dr [name] and returned to HIM to be faxed to Dr [name].

## 2012-03-29 NOTE — Telephone Encounter (Signed)
Spoke with pt. Pt states he feels fine--no changes since OV with Dr [name].

## 2012-04-03 ENCOUNTER — Telehealth: Payer: Self-pay | Admitting: Cardiology

## 2012-04-03 ENCOUNTER — Other Ambulatory Visit: Payer: Self-pay | Admitting: Urology

## 2012-04-04 NOTE — Telephone Encounter (Signed)
error 

## 2012-04-10 ENCOUNTER — Encounter (HOSPITAL_COMMUNITY): Payer: Self-pay | Admitting: Pharmacy Technician

## 2012-04-15 ENCOUNTER — Encounter (HOSPITAL_COMMUNITY)
Admission: RE | Admit: 2012-04-15 | Discharge: 2012-04-15 | Disposition: A | Discharge status: Home / Self Care | Payer: Medicare Other | Source: Ambulatory Visit | Attending: Urology | Admitting: Urology

## 2012-04-15 ENCOUNTER — Encounter (HOSPITAL_COMMUNITY): Payer: Self-pay

## 2012-04-15 ENCOUNTER — Ambulatory Visit (HOSPITAL_COMMUNITY)
Admission: RE | Admit: 2012-04-15 | Discharge: 2012-04-15 | Disposition: A | Discharge status: Home / Self Care | Payer: Medicare Other | Source: Ambulatory Visit | Attending: Urology | Admitting: Urology

## 2012-04-15 DIAGNOSIS — Z01818 Encounter for other preprocedural examination: Secondary | ICD-10-CM | POA: Insufficient documentation

## 2012-04-15 DIAGNOSIS — N210 Calculus in bladder: Secondary | ICD-10-CM | POA: Insufficient documentation

## 2012-04-15 DIAGNOSIS — Z951 Presence of aortocoronary bypass graft: Secondary | ICD-10-CM | POA: Insufficient documentation

## 2012-04-15 DIAGNOSIS — Z01812 Encounter for preprocedural laboratory examination: Secondary | ICD-10-CM | POA: Insufficient documentation

## 2012-04-15 HISTORY — DX: Acute myocardial infarction, unspecified: I21.9

## 2012-04-15 HISTORY — DX: Reserved for inherently not codable concepts without codable children: IMO0001

## 2012-04-15 HISTORY — DX: Unspecified osteoarthritis, unspecified site: M19.90

## 2012-04-15 HISTORY — DX: Anemia, unspecified: D64.9

## 2012-04-15 HISTORY — DX: Encounter for other specified aftercare: Z51.89

## 2012-04-15 LAB — BASIC METABOLIC PANEL
BUN: 23 mg/dL (ref 6–23)
CO2: 24 mEq/L (ref 19–32)
Calcium: 10.1 mg/dL (ref 8.4–10.5)
Chloride: 104 mEq/L (ref 96–112)
Creatinine, Ser: 1.57 mg/dL — ABNORMAL HIGH (ref 0.50–1.35)
GFR calc Af Amer: 47 mL/min — ABNORMAL LOW (ref >90)
GFR calc non Af Amer: 40 mL/min — ABNORMAL LOW (ref >90)
Glucose, Bld: 74 mg/dL (ref 70–99)
Potassium: 4.4 mEq/L (ref 3.5–5.1)
Sodium: 137 mEq/L (ref 135–145)

## 2012-04-15 LAB — CBC
HCT: 39 % (ref 39.0–52.0)
Hemoglobin: 12.9 g/dL — ABNORMAL LOW (ref 13.0–17.0)
MCH: 30.8 pg (ref 26.0–34.0)
MCHC: 33.1 g/dL (ref 30.0–36.0)
MCV: 93.1 fL (ref 78.0–100.0)
Platelets: 161 10*3/uL (ref 150–400)
RBC: 4.19 MIL/uL — ABNORMAL LOW (ref 4.22–5.81)
RDW: 13 % (ref 11.5–15.5)
WBC: 9 10*3/uL (ref 4.0–10.5)

## 2012-04-15 LAB — SURGICAL PCR SCREEN
MRSA, PCR: NEGATIVE
Staphylococcus aureus: NEGATIVE

## 2012-04-15 NOTE — Pre-Procedure Instructions (Signed)
Left message for [name] [name], surgery scheduler to inform Dr.Ottelin to look at labs CBC,BMET in [facility] as abnormal.

## 2012-04-15 NOTE — Patient Instructions (Signed)
22 [name] [name]  04/15/2012   Your procedure is scheduled on:  Monday 04/22/2012 at 1030am  Report to [facility] at 0800 AM.  Call this number if you have problems the morning of surgery: [phone]   Remember:   Do not eat food:After Midnight.  May have clear liquids:until Midnight .  Clear liquids include soda, tea, black coffee, apple or grape juice, broth.  Take these medicines the morning of surgery with A SIP OF WATER: Metoprolol   Do not wear jewelry  Do not wear lotions, powders  Do not bring valuables to the hospital.  Contacts, dentures or bridgework may not be worn into surgery.  Leave suitcase in the car. After surgery it may be brought to your room.  For patients admitted to the hospital, checkout time is 11:00 AM the day of discharge.     Special Instructions: CHG Shower Use Special Wash: 1/2 bottle night before surgery and 1/2 bottle morning of surgery.   Please read over the following fact sheets that you were given: MRSA Information, Sleep apnea sheet, Incentive Spirometry sheet

## 2012-04-19 DIAGNOSIS — N210 Calculus in bladder: Secondary | ICD-10-CM

## 2012-04-19 NOTE — H&P (Signed)
History of Present Illness     Bilateral nephrolithiasis: [name] [name] has a long history of nephrolithiasis. Bilateral, renal calculi were noted on multiple CT scans over the years.       Bladder calculus: [name] [name] was noted to have a 3.5 cm stone within the bladder on CT scan done in 9/07. On a CT scan done 3/11 the stone appeared to be virtually unchanged measuring 3.6 x 3.6 cm. Surprisingly the patient is virtually asymptomatic from this with no irritative symptoms or gross hematuria. It has caused persistent pyuria however. By KUB in 9/12 it had increased in size to 5 x 5 cm.      Bilateral renal cysts: These are all simple cysts and of no clinical significance.      BPH with bladder outlet obstruction: [name] [name] has mild to moderate obstructive voiding symptoms with nocturia and some hesitancy especially at night but voids with a good stream throughout the day. [name] [name] has no rectum and therefore his prostate is not palpable however [name] [name] reported that his PSA was checked at the [place] in 2/11 and [name] [name] indicates it runs about 1.    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 The sensitivity patterns have been identical and showed no evidence of resistance. Each culture had less than 100 K. colonies. Antibiotic therapy results in resolution of his symptoms which typically consist of frequency, urgency and mild dysuria. [name] [name] has not had any associated fever. [name] [name] does have a known large bladder calculus.   Past Medical History Problems  1. History of  Acute Myocardial Infarction V12.59 2. History of  Arthritis V13.4 3. History of  Chronic Renal Insufficiency 585.9 4. History of  Edema Of The Penis 607.83 5. History of  Hypercholesterolemia 272.0 6. History of  Hypertension 401.9 7. History of  Renal Insufficiency 593.9 8. History of  Scrotum Edema 608.86 9. History of  Skin Cancer V10.83  Surgical History Problems  1. History of  Cataract  Surgery 2. History of  Coronary Artery Triple Arterial Bypass Graft 3. History of  Ileostomy Revision Complex  Current Meds 1. Aspirin 81 MG Oral Tablet; Therapy: (Recorded:14Mar2012) to 2. Centrum Silver Oral Tablet; Therapy: (Recorded:14Sep2011) to 3. Ciprofloxacin HCl 500 MG Oral Tablet; TAKE 1 TABLET Every twelve hours for 10 days and then  1/2 tablet daily therafter to prevent UTI; Therapy: 05Mar2013 to (Evaluate:04May2013)  Requested  for: 05Mar2013; Last Rx:05Mar2013 4. Fish Oil CAPS; Therapy: (Recorded:14Sep2011) to 5. Flaxseed Oil CAPS; Therapy: (Recorded:14Sep2011) to 6. Folic Acid TABS; Therapy: (Recorded:14Sep2011) to 7. Furosemide 40 MG Oral Tablet; Therapy: (Recorded:14Mar2012) to 8. ICaps CAPS; Therapy: (Recorded:14Sep2011) to 9. Klor-Con 20 MEQ Oral Packet; Therapy: (Recorded:14Mar2012) to 10. Lisinopril 10 MG Oral Tablet; Therapy: (Recorded:14Mar2012) to 11. Metoprolol Tartrate 25 MG Oral Tablet; Therapy: (Recorded:14Mar2012) to 12. Simvastatin 10 MG Oral Tablet; Therapy: (Recorded:14Mar2012) to 13. Tamsulosin HCl 0.4 MG Oral Capsule; Therapy: (Recorded:14Mar2012) to  Allergies Medication  1. No Known Drug Allergies  Family History Problems  1. Paternal history of  Acute Myocardial Infarction V17.3 2. Maternal history of  Colon Cancer V16.0  Social History Problems  1. Caffeine Use 2. Former Smoker V15.82 3 ppd 3. Marital History - Currently Married  Review of Systems Genitourinary and gastrointestinal system(s) were reviewed and pertinent findings if present are noted.  Genitourinary: urinary frequency, dysuria, nocturia, difficulty starting the urinary stream and scrotal swelling, but no hematuria.  Hematologic/Lymphatic: a tendency to easily bruise.  Musculoskeletal: back  pain, but no joint pain.    Vitals Vital Signs BMI Calculated: 27.32 BSA Calculated: 1.78 Height: 5 ft 4 in Weight: 160 lb  Blood Pressure: 126 / 69 Heart Rate: 51  General  appearance: alert, cooperative and no distress Neck: no adenopathy and no JVD Resp: clear to auscultation bilaterally Cardio: brdycardic rate and regular rhythm GI: soft, non-tender; bowel sounds normal; no masses,  no organomegaly Male genitalia: normal, penis: no lesions or discharge. testes: no masses or tenderness. no hernias Extremities: extremities normal, atraumatic, no cyanosis or edema Skin: Skin color, texture, turgor normal. No rashes or lesions Neurologic: Grossly normal  Assessment        I have discussed with the patient the fact that [name] [name] appears to have recurrent UTIs secondary to the same organism indicating persistent infection. [name] [name] has  bladder calculus that is undoubtedly serving as a nidus and likely preventing complete clearance of his enterococcus infections. The options would be either cystotomy with extraction of his calculus versus long-term, suppressive antibiotics. His stone [name] [name] is too large for cystoscopic treatment. After discussing the pluses and minuses of each form of therapy [name] [name] has elected to proceed with surgical removal of the stone. [name] [name] does have some irritative symptoms such as urinary frequency and nocturia which I told him may improve with removal of the stone and eventual clearance of infection. I went over the incision used which would be midline and [name] [name] does have a colostomy in the right lower quadrant I told him that I would avoid that. I may even consider a Pfannenstiel incision that I told him I would make that determination at the time of surgery. We then went over the potential risks and complications of the surgery and will also be sure to get surgical clearance from his cardiologist since this is a semielective procedure. [name] [name] understands and has elected to proceed in that fashion.   Plan    1. I'm going to have him begin Levaquin 5 days prior to his planned surgical date. 2. [name] [name] presents today  for cystotomy and extraction of his bladder calculus.

## 2012-04-19 NOTE — Discharge Instructions (Signed)
1. Activity:  You are encouraged to ambulate frequently (about every hour during waking hours) to help prevent blood clots from forming in your legs or lungs.  However, you should not engage in any heavy lifting (> 10-15 lbs), strenuous activity, or straining. °2. Diet: You should continue a clear liquid diet until passing gas from below if you are not doing so already.  Once this occurs, you may advance your diet to a soft diet that would be easy to digest (i.e soups, scrambled eggs, mashed potatoes, etc.) for 24 hours just as you would if getting over a bad stomach flu.  If tolerating this diet well for 24 hours, you may then begin eating regular food.  It will be normal to have some amount of bloating, nausea, and abdominal discomfort intermittently. °3. Prescriptions:  You will be provided a prescription for pain medication to take as needed.  If your pain is not severe enough to require the prescription pain medication, you may take extra strength Tylenol instead.  You should also take an over the counter stool softener (Colace 100 mg twice daily) to avoid straining with bowel movements as the pain medication may constipate you. Finally, you may also be provided a prescription for an antibiotic. °4. Catheter care: (If you are discharged with a catheter) You will be taught how to take care of the catheter by the nursing staff prior to discharge from the hospital.  You may use both a leg bag and the larger bedside bag but it is recommended to at least use the bigger bedside bag at nighttime as the leg bag is small and will fill up overnight and also does not drain as well when lying flat. You may periodically feel a strong urge to void with the catheter in place.  This is a bladder spasm and most often can occur when having a bowel movement or when you are moving around. It can be associated with a small amount of urine leaking out around the catheter. It is typically self-limited and usually will stop after a  few minutes.  You may use some Vaseline or Neosporin around the tip of the catheter to reduce friction at the tip of the penis. °5. Incisions: You may remove your dressing bandage the 2nd day after surgery.  Once the bandage is removed, the incisions may stay open to air.  You may start showering (not soaking or bathing in water) 48 hours after surgery and the incision simply needs to be patted dry after the shower.  No additional care is needed. You may choose to reapply a dry gauze dressing to prevent the incision from rubbing against your clothing. °6. What to call us about: You should call the office ([phone]) if you develop fever > 101, persistent vomiting, or the catheter stops draining. Also, feel free to call with any other questions you may have. ° °

## 2012-04-21 MED ORDER — SODIUM CHLORIDE 0.9 % IV SOLN
2.0000 g | INTRAVENOUS | Status: AC
  Administered 2012-04-22: 2 g via INTRAVENOUS
  Filled 2012-04-21: qty 2000

## 2012-04-22 ENCOUNTER — Encounter (HOSPITAL_COMMUNITY): Payer: Self-pay | Admitting: *Deleted

## 2012-04-22 ENCOUNTER — Encounter (HOSPITAL_COMMUNITY)
Admission: RE | Disposition: A | Discharge status: Home / Self Care | Payer: Self-pay | Source: Ambulatory Visit | Attending: Urology

## 2012-04-22 ENCOUNTER — Inpatient Hospital Stay (HOSPITAL_COMMUNITY)
Admission: RE | Admit: 2012-04-22 | Discharge: 2012-04-23 | DRG: 664 | Disposition: A | Discharge status: Home / Self Care | Payer: Medicare Other | Source: Ambulatory Visit | Attending: Urology | Admitting: Urology

## 2012-04-22 ENCOUNTER — Encounter (HOSPITAL_COMMUNITY): Payer: Self-pay | Admitting: Anesthesiology

## 2012-04-22 ENCOUNTER — Ambulatory Visit (HOSPITAL_COMMUNITY): Payer: Medicare Other | Admitting: Anesthesiology

## 2012-04-22 DIAGNOSIS — Z01812 Encounter for preprocedural laboratory examination: Secondary | ICD-10-CM

## 2012-04-22 DIAGNOSIS — N189 Chronic kidney disease, unspecified: Secondary | ICD-10-CM | POA: Diagnosis present

## 2012-04-22 DIAGNOSIS — Z7901 Long term (current) use of anticoagulants: Secondary | ICD-10-CM

## 2012-04-22 DIAGNOSIS — N138 Other obstructive and reflux uropathy: Secondary | ICD-10-CM | POA: Diagnosis present

## 2012-04-22 DIAGNOSIS — N401 Enlarged prostate with lower urinary tract symptoms: Secondary | ICD-10-CM | POA: Diagnosis present

## 2012-04-22 DIAGNOSIS — I2510 Atherosclerotic heart disease of native coronary artery without angina pectoris: Secondary | ICD-10-CM | POA: Diagnosis present

## 2012-04-22 DIAGNOSIS — Z933 Colostomy status: Secondary | ICD-10-CM

## 2012-04-22 DIAGNOSIS — E7800 Pure hypercholesterolemia, unspecified: Secondary | ICD-10-CM | POA: Diagnosis present

## 2012-04-22 DIAGNOSIS — I252 Old myocardial infarction: Secondary | ICD-10-CM

## 2012-04-22 DIAGNOSIS — I129 Hypertensive chronic kidney disease with stage 1 through stage 4 chronic kidney disease, or unspecified chronic kidney disease: Secondary | ICD-10-CM | POA: Diagnosis present

## 2012-04-22 DIAGNOSIS — Z87891 Personal history of nicotine dependence: Secondary | ICD-10-CM

## 2012-04-22 DIAGNOSIS — N210 Calculus in bladder: Secondary | ICD-10-CM

## 2012-04-22 HISTORY — PX: CYSTOSTOMY: SHX155

## 2012-04-22 SURGERY — CREATION, CYSTOSTOMY, SUPRAPUBIC
Anesthesia: General | Wound class: Clean Contaminated

## 2012-04-22 MED ORDER — LIDOCAINE HCL 1 % IJ SOLN
INTRAMUSCULAR | Status: DC | PRN
  Administered 2012-04-22: 50 mg via INTRADERMAL

## 2012-04-22 MED ORDER — HYDROMORPHONE HCL PF 1 MG/ML IJ SOLN
0.5000 mg | INTRAMUSCULAR | Status: DC | PRN
  Administered 2012-04-22: 1 mg via INTRAVENOUS
  Filled 2012-04-22: qty 1

## 2012-04-22 MED ORDER — PHENYLEPHRINE HCL 10 MG/ML IJ SOLN
INTRAMUSCULAR | Status: DC | PRN
  Administered 2012-04-22: 40 ug via INTRAVENOUS
  Administered 2012-04-22 (×3): 80 ug via INTRAVENOUS

## 2012-04-22 MED ORDER — GLYCOPYRROLATE 0.2 MG/ML IJ SOLN
INTRAMUSCULAR | Status: DC | PRN
  Administered 2012-04-22: 0.2 mg via INTRAVENOUS

## 2012-04-22 MED ORDER — PROPOFOL 10 MG/ML IV BOLUS
INTRAVENOUS | Status: DC | PRN
  Administered 2012-04-22: 100 mg via INTRAVENOUS

## 2012-04-22 MED ORDER — ONDANSETRON HCL 4 MG/2ML IJ SOLN: 2.0000 mg | INTRAMUSCULAR | Status: DC | PRN

## 2012-04-22 MED ORDER — OXYCODONE-ACETAMINOPHEN 5-325 MG PO TABS: 1.0000 | ORAL_TABLET | ORAL | Status: DC | PRN

## 2012-04-22 MED ORDER — 0.9 % SODIUM CHLORIDE (POUR BTL) OPTIME
TOPICAL | Status: DC | PRN
  Administered 2012-04-22: 1000 mL

## 2012-04-22 MED ORDER — FENTANYL CITRATE 0.05 MG/ML IJ SOLN
INTRAMUSCULAR | Status: DC | PRN
  Administered 2012-04-22 (×5): 50 ug via INTRAVENOUS

## 2012-04-22 MED ORDER — DOCUSATE SODIUM 100 MG PO CAPS
100.0000 mg | ORAL_CAPSULE | Freq: Two times a day (BID) | ORAL | Status: DC
  Administered 2012-04-23: 100 mg via ORAL
  Filled 2012-04-22 (×3): qty 1

## 2012-04-22 MED ORDER — FENTANYL CITRATE 0.05 MG/ML IJ SOLN: 25.0000 ug | INTRAMUSCULAR | Status: DC | PRN

## 2012-04-22 MED ORDER — SODIUM CHLORIDE 0.9 % IV SOLN
INTRAVENOUS | Status: DC
  Administered 2012-04-22 – 2012-04-23 (×2): via INTRAVENOUS

## 2012-04-22 MED ORDER — LACTATED RINGERS IV SOLN
INTRAVENOUS | Status: DC | PRN
  Administered 2012-04-22 (×2): via INTRAVENOUS

## 2012-04-22 MED ORDER — BUPIVACAINE HCL (PF) 0.5 % IJ SOLN: INTRAMUSCULAR | Status: DC | PRN

## 2012-04-22 MED ORDER — BELLADONNA ALKALOIDS-OPIUM 16.2-60 MG RE SUPP: 1.0000 | Freq: Four times a day (QID) | RECTAL | Status: DC | PRN

## 2012-04-22 MED ORDER — BUPIVACAINE HCL (PF) 0.5 % IJ SOLN
INTRAMUSCULAR | Status: AC
  Filled 2012-04-22: qty 30

## 2012-04-22 MED ORDER — CEFAZOLIN SODIUM 1-5 GM-% IV SOLN
1.0000 g | Freq: Three times a day (TID) | INTRAVENOUS | Status: DC
  Administered 2012-04-23 (×2): 1 g via INTRAVENOUS
  Filled 2012-04-22 (×5): qty 50

## 2012-04-22 MED ORDER — ONDANSETRON HCL 4 MG/2ML IJ SOLN
INTRAMUSCULAR | Status: DC | PRN
  Administered 2012-04-22: 4 mg via INTRAVENOUS

## 2012-04-22 SURGICAL SUPPLY — 38 items
BAG URINE DRAINAGE (UROLOGICAL SUPPLIES) ×2 IMPLANT
BAG URINE LEG 500ML (DRAIN) IMPLANT
BLADE SURG 15 STRL LF DISP TIS (BLADE) ×1 IMPLANT
BLADE SURG 15 STRL SS (BLADE) ×1
BLADE SURG ROTATE 9660 (MISCELLANEOUS) IMPLANT
CATH FOLEY 2WAY SLVR 30CC 22FR (CATHETERS) ×2 IMPLANT
CLOTH BEACON ORANGE TIMEOUT ST (SAFETY) ×2 IMPLANT
COUNTER NEEDLE 20 DBL MAG RED (NEEDLE) ×2 IMPLANT
COVER SURGICAL LIGHT HANDLE (MISCELLANEOUS) ×2 IMPLANT
DRAIN CHANNEL 19F RND (DRAIN) IMPLANT
DRAPE INCISE IOBAN 66X45 STRL (DRAPES) ×2 IMPLANT
DRAPE LAPAROSCOPIC ABDOMINAL (DRAPES) ×2 IMPLANT
ELECT REM PT RETURN 9FT ADLT (ELECTROSURGICAL) ×2
ELECTRODE REM PT RTRN 9FT ADLT (ELECTROSURGICAL) ×1 IMPLANT
EVACUATOR SILICONE 100CC (DRAIN) IMPLANT
GLOVE BIOGEL M 8.0 STRL (GLOVE) ×2 IMPLANT
GOWN STRL REIN XL XLG (GOWN DISPOSABLE) ×2 IMPLANT
KIT BASIN OR (CUSTOM PROCEDURE TRAY) ×2 IMPLANT
KIT SUPRAPUBIC CATH (MISCELLANEOUS) IMPLANT
MANIFOLD NEPTUNE II (INSTRUMENTS) ×2 IMPLANT
NEEDLE HYPO 22GX1.5 SAFETY (NEEDLE) ×2 IMPLANT
NS IRRIG 1000ML POUR BTL (IV SOLUTION) IMPLANT
PACK CYSTO (CUSTOM PROCEDURE TRAY) ×2 IMPLANT
PACK GENERAL/GYN (CUSTOM PROCEDURE TRAY) ×2 IMPLANT
PENCIL BUTTON HOLSTER BLD 10FT (ELECTRODE) ×2 IMPLANT
PLUG CATH AND CAP STER (CATHETERS) ×2 IMPLANT
SPONGE GAUZE 4X4 12PLY (GAUZE/BANDAGES/DRESSINGS) ×2 IMPLANT
STAPLER VISISTAT 35W (STAPLE) ×2 IMPLANT
SUT CHROMIC 2 0 SH (SUTURE) ×4 IMPLANT
SUT CHROMIC 3 0 SH 27 (SUTURE) ×2 IMPLANT
SUT PDS AB 0 CTX 60 (SUTURE) ×2 IMPLANT
SUT SILK 2 0 30  PSL (SUTURE)
SUT SILK 2 0 30 PSL (SUTURE) IMPLANT
SUT VIC AB 2-0 SH 27 (SUTURE) ×1
SUT VIC AB 2-0 SH 27X BRD (SUTURE) ×1 IMPLANT
SYR CONTROL 10ML LL (SYRINGE) ×2 IMPLANT
TOWEL OR 17X26 10 PK STRL BLUE (TOWEL DISPOSABLE) ×4 IMPLANT
WATER STERILE IRR 3000ML UROMA (IV SOLUTION) ×2 IMPLANT

## 2012-04-22 NOTE — Op Note (Signed)
PATIENT:  [name] [name]  PRE-OPERATIVE DIAGNOSIS:   1. A 5 he is cm bladder calculus. 2. Chronic, recurrent UTIs  POST-OPERATIVE DIAGNOSIS:  Same  PROCEDURE:  Procedure(s): Open cystolithotomy  SURGEON:  Garnett Farm  INDICATION: Mr. Ginsberg is a 78 year old male with a known 5 cm bladder calculus. He had been doing very well with the bladder calculus which has been present at least since 2007. He has recently had difficulty with recurrent, persistent UTIs. Because of that and the fact that he has become symptomatic we discussed treatment and due to the large size of the stone I have recommended an open procedure. He has been on antibiotics preoperatively. He also received antibiotics intravenously preop.  ANESTHESIA: General   EBL:  Minimal  DRAINS:  22 French Foley catheter   LOCAL MEDICATIONS USED:  None  SPECIMEN:   1. Bladder stone to patient 2. Urine for culture and sensitivity  Description of procedure:  after informed consent was obtained the patient was brought to the major or, placed on the table and administered general anesthesia. His ileostomy as well as lower abdomen were then sterilely prepped. I placed a Betadine soaked gauze over the ileostomy site and then applied an Ioban drape. He was then sterilely draped and an official timeout was then performed.  A 22 French Foley catheter was then inserted in the bladder and the bladder was filled to capacity. Catheter was clamped and a midline incision was made at the level of the superior border of the symphysis pubis and carried down to the rectus fascia. His old suture line was identified by the presence of interrupted Prolene sutures which were removed and I then opened the rectus fascia inferiorly and entered the space of Retzius. I then dissected superiorly to be sure there was no bowel adherent to the undersurface of the incision. I then incised the fascia through the extent of the incision. I was able to remain  extraperitoneal. A Bookwalter retractor was then placed for retraction and the bladder was palpated and cleared of surrounding tissue anteriorly. 2-0 Vicryl sutures were then placed in the detrusor as holding sutures and a midline incision was made in the bladder with the Bovie. I opened up the bladder and was able to identify a single large jack stone. It was grasped and extracted. Further palpation revealed no other stones in the bladder. I also noted no stones on visual inspection in the bladder appeared to be normal without any lesions or tumors. I then irrigated the bladder with a liter of saline.  I then closed the bladder in 3 layers. The first layer encompassed the mucosa and a portion of the detrusor in a running fashion using 3-0 chromic.. The second layer was 2-0 chromic closing the adventitia and detrusor in a running fashion and then a third imbricating layer of 2-0 chromic was used. I tested the closure by filling the bladder through the catheter and noted no extravasation or leak. I then drained the bladder and irrigated the wound again with more saline. His closure appeared to be excellent and the Foley catheter, when irrigated returned completely clear. A suprapubic tube was not inserted and I did not place a paravesical drain due to the watertight closure.  The rectus fascia was then reapproximated in the midline with a running #1 PDS suture. The subcutaneous tissue was irrigated with saline and the skin was closed with skin staples. A sterile occlusive dressing was applied and the patient was awakened and taken to  the recovery room in stable and satisfactory condition. He tolerated the procedure well with no intraoperative complications. Needle sponge and instrument counts were correct x2 at the end of the operation.   PLAN OF CARE: Discharge to home after PACU  PATIENT DISPOSITION:  PACU - hemodynamically stable.

## 2012-04-22 NOTE — Anesthesia Postprocedure Evaluation (Signed)
  Anesthesia Post-op Note  Patient: [name] [name]  Procedure(s) Performed: Procedure(s) (LRB): CYSTOSTOMY SUPRAPUBIC (N/A)  Patient Location: PACU  Anesthesia Type: General  Level of Consciousness: awake and alert   Airway and Oxygen Therapy: Patient Spontanous Breathing  Post-op Pain: mild  Post-op Assessment: Post-op Vital signs reviewed, Patient's Cardiovascular Status Stable, Respiratory Function Stable, Patent Airway and No signs of Nausea or vomiting  Post-op Vital Signs: stable  Complications: No apparent anesthesia complications

## 2012-04-22 NOTE — Interval H&P Note (Signed)
History and Physical Interval Note:  04/22/2012 10:28 AM  [name] [name]  has presented today for surgery, with the diagnosis of Bladder Calculus  The various methods of treatment have been discussed with the patient and family. After consideration of risks, benefits and other options for treatment, the patient has consented to  Procedure(s) (LRB): CYSTOSTOMY SUPRAPUBIC (N/A) as a surgical intervention .  The patients' history has been reviewed, patient examined, no change in status, stable for surgery.  I have reviewed the patients' chart and labs.  Questions were answered to the patient's satisfaction.     Garnett Farm

## 2012-04-22 NOTE — Anesthesia Preprocedure Evaluation (Addendum)
Anesthesia Evaluation  Patient identified by MRN, date of birth, ID band Patient awake    Reviewed: Allergy & Precautions, H&P , NPO status , Patient's Chart, lab work & pertinent test results  Airway Mallampati: II TM Distance: >3 FB Neck ROM: Full    Dental   Denies loose teeth. Missing many teeth.:   Pulmonary neg pulmonary ROS,  breath sounds clear to auscultation  Pulmonary exam normal       Cardiovascular hypertension, Pt. on medications and Pt. on home beta blockers + CAD and + Past MI + dysrhythmias Atrial Fibrillation Rhythm:Regular Rate:Normal  Clearance Dr. Jearld Pies. CABG 10/11. Postop A.Fib.   Neuro/Psych negative neurological ROS  negative psych ROS   GI/Hepatic Neg liver ROS, PUD,   Endo/Other  negative endocrine ROS  Renal/GU negative Renal ROS  negative genitourinary   Musculoskeletal negative musculoskeletal ROS (+)   Abdominal   Peds negative pediatric ROS (+)  Hematology negative hematology ROS (+)   Anesthesia Other Findings   Reproductive/Obstetrics negative OB ROS                          Anesthesia Physical Anesthesia Plan  ASA: III  Anesthesia Plan: General   Post-op Pain Management:    Induction: Intravenous  Airway Management Planned: LMA  Additional Equipment:   Intra-op Plan:   Post-operative Plan: Extubation in OR  Informed Consent: I have reviewed the patients History and Physical, chart, labs and discussed the procedure including the risks, benefits and alternatives for the proposed anesthesia with the patient or authorized representative who has indicated his/her understanding and acceptance.   Dental advisory given  Plan Discussed with: CRNA  Anesthesia Plan Comments:         Anesthesia Quick Evaluation

## 2012-04-22 NOTE — Transfer of Care (Signed)
Immediate Anesthesia Transfer of Care Note  Patient: [name] [name]  Procedure(s) Performed: Procedure(s) (LRB): CYSTOSTOMY SUPRAPUBIC (N/A)  Patient Location: PACU  Anesthesia Type: General  Level of Consciousness: alert , oriented and patient cooperative  Airway & Oxygen Therapy: Patient Spontanous Breathing and Patient connected to face mask oxygen  Post-op Assessment: Report given to PACU RN, Post -op Vital signs reviewed and stable and Patient moving all extremities  Post vital signs: Reviewed and stable  Complications: No apparent anesthesia complications

## 2012-04-23 LAB — URINE CULTURE
Colony Count: NO GROWTH
Culture  Setup Time: 201304291710
Culture: NO GROWTH

## 2012-04-23 MED ORDER — LEVOFLOXACIN 500 MG PO TABS: 500.0000 mg | ORAL_TABLET | Freq: Every day | ORAL | Status: AC

## 2012-04-23 MED ORDER — HYDROCODONE-ACETAMINOPHEN 10-325 MG PO TABS: 1.0000 | ORAL_TABLET | Freq: Four times a day (QID) | ORAL | Status: AC | PRN

## 2012-04-23 NOTE — Discharge Summary (Signed)
Physician Discharge Summary  Patient ID: [name] [name] MRN: [id number] DOB/AGE: 76-Mar-1931 76 y.o.  Admit date: 04/22/2012 Discharge date: 04/23/2012  Admission Diagnoses: 1.Bladder calculus 2. Chronic cystitis  Discharge Diagnoses:  Principal Problem:  *Bladder calculi   Discharged Condition: good  Hospital Course: [name] [name] was admitted after having undergone an open cystolithotomy without complication. A Foley catheter was left indwelling and he was observed overnight. He had little to no pain throughout the night and was tolerating a regular diet. His abdomen was found to be soft and nontender and his Foley catheter was draining completely clear urine. He was therefore felt ready for discharge.  Consults: None  Significant Diagnostic Studies: microbiology: urine culture: Pending at the time of discharge.  Treatments: surgery: Open Cystolithotomy  Discharge Exam: Blood pressure 151/82, pulse 89, temperature 99.4 F (37.4 C), temperature source Oral, resp. rate 19, height 5\' 4"  (1.626 m), weight 72.576 kg (160 lb), SpO2 100.00%. GI: soft, non-tender; bowel sounds normal; no masses,  no organomegaly Male genitalia: normal, Foley catheter was indwelling. Incision/Wound:His dressing was dry and intact.  Disposition:   Discharge Orders    Future Appointments: Provider: Department: Dept Phone: Center:   05/07/2012 8:40 AM [facility] [phone] [facility]     Future Orders Please Complete By Expires   Urinary leg bag      Comments:   Give patient a leg bag on discharge.   Discharge patient        Medication List  As of 04/23/2012  7:07 AM   TAKE these medications         aspirin EC 81 MG tablet   Take 81 mg by mouth daily.      ferrous sulfate 325 (65 FE) MG tablet   Take 325 mg by mouth daily with breakfast.      fish oil-omega-3 fatty acids 1000 MG capsule   Take 2 g by mouth daily.      FLAX SEED OIL PO   Take 1 capsule by mouth  daily.      folic acid 400 MCG tablet   Commonly known as: FOLVITE   Take 400 mcg by mouth daily.      furosemide 40 MG tablet   Commonly known as: LASIX   Take 20-40 mg by mouth See admin instructions. Takes 1 tablet on Monday, Wednesday, and Friday and 0.5 tablet on all other days      HYDROcodone-acetaminophen 10-325 MG per tablet   Commonly known as: NORCO   Take 1 tablet by mouth every 6 (six) hours as needed for pain.      ICAPS PO   Take by mouth.      levofloxacin 500 MG tablet   Commonly known as: LEVAQUIN   Take 1 tablet (500 mg total) by mouth daily.      lisinopril 10 MG tablet   Commonly known as: PRINIVIL,ZESTRIL   Take 5 mg by mouth daily.      metoprolol 100 MG tablet   Commonly known as: LOPRESSOR   Take 50 mg by mouth 2 (two) times daily. Take 1/2 tablet twice a day      mulitivitamin with minerals Tabs   Take 1 tablet by mouth daily.      potassium chloride SA 20 MEQ tablet   Commonly known as: K-DUR,KLOR-CON   Take 10-20 mEq by mouth See admin instructions. Takes 1 tablet on Mondays, Wednesdays, and Fridays and takes 0.5 tablet on all other days  simvastatin 10 MG tablet   Commonly known as: ZOCOR   Take 10 mg by mouth at bedtime.      Tamsulosin HCl 0.4 MG Caps   Commonly known as: FLOMAX   Take 0.4 mg by mouth daily.      VITAMIN D-3 PO   Take 1,000 Units by mouth daily.           Follow-up Information    Follow up with Garnett Farm, MD on 04/29/2012. (at 8:45)    Contact information:   [address] [place] [address] [phone]        He will maintain his Foley catheter on discharge. I will also continue Levaquin based on his preop urine culture sensitivities. His intraoperative urine culture will be checked and antibiotic adjustments made as necessary. He will then followup in one week for a cystogram and if no extravasation is noted his Foley catheter will be removed at that time.  SignedGarnett Farm 04/23/2012, 7:07 AM

## 2012-04-23 NOTE — Progress Notes (Signed)
Patient changed over to leg bag foley, explained use, how to care for it and how to drain it to patient and wife. Patient also given a new foley drainage bag to use at night, explained how to change from leg bag to standard bag. Patient and wife verbalized understanding and demonstrated how to empty and adjust leg straps for comfort. Patient vitals stable, in no discomfort and ready to go home. Will be transported to home via private vehicle with spouse.

## 2012-04-24 ENCOUNTER — Encounter (HOSPITAL_COMMUNITY): Payer: Self-pay | Admitting: Urology

## 2012-05-07 ENCOUNTER — Other Ambulatory Visit: Payer: Medicare Other

## 2012-05-14 ENCOUNTER — Other Ambulatory Visit (INDEPENDENT_AMBULATORY_CARE_PROVIDER_SITE_OTHER): Payer: Medicare Other

## 2012-05-14 DIAGNOSIS — I5032 Chronic diastolic (congestive) heart failure: Secondary | ICD-10-CM

## 2012-05-14 DIAGNOSIS — I509 Heart failure, unspecified: Secondary | ICD-10-CM

## 2012-05-14 DIAGNOSIS — I25810 Atherosclerosis of coronary artery bypass graft(s) without angina pectoris: Secondary | ICD-10-CM

## 2012-05-14 LAB — BASIC METABOLIC PANEL
Chloride: 110 mEq/L (ref 96–112)
GFR: 49.07 mL/min — ABNORMAL LOW (ref >60.00)
Potassium: 4.4 mEq/L (ref 3.5–5.1)
Sodium: 142 mEq/L (ref 135–145)

## 2012-05-14 LAB — LIPID PANEL
LDL Cholesterol: 76 mg/dL (ref 0–99)
Total CHOL/HDL Ratio: 3
Triglycerides: 69 mg/dL (ref 0.0–149.0)
VLDL: 13.8 mg/dL (ref 0.0–40.0)

## 2012-05-14 LAB — HEPATIC FUNCTION PANEL
ALT: 18 U/L (ref 0–53)
AST: 26 U/L (ref 0–37)
Bilirubin, Direct: 0.1 mg/dL (ref 0.0–0.3)
Total Bilirubin: 1.1 mg/dL (ref 0.3–1.2)

## 2012-05-17 ENCOUNTER — Telehealth: Payer: Self-pay | Admitting: *Deleted

## 2012-05-17 NOTE — Telephone Encounter (Signed)
Message copied by Tarri Fuller on Fri May 17, 2012  4:38 PM ------      Message from: [name]      Created: Fri May 17, 2012  8:47 AM       Stable BMET, good lipids

## 2012-06-03 ENCOUNTER — Other Ambulatory Visit: Payer: Self-pay | Admitting: Cardiology

## 2012-06-06 ENCOUNTER — Telehealth: Payer: Self-pay | Admitting: Cardiology

## 2012-06-06 MED ORDER — LISINOPRIL 10 MG PO TABS: 5.0000 mg | ORAL_TABLET | Freq: Every day | ORAL | Status: DC

## 2012-06-06 NOTE — Telephone Encounter (Signed)
  Refill f/u   [facility]  [phone]  Please verify medication dosage for lisinopril (PRINIVIL,ZESTRIL) 10 MG tablet, RX instructions different

## 2012-06-07 ENCOUNTER — Telehealth: Payer: Self-pay | Admitting: Cardiology

## 2012-06-07 NOTE — Telephone Encounter (Signed)
o mention of changing dose of lisinopril on computer.  Pt picked up rx and it was 10mg  tabs with directions to take one-half tab daily.  He is concerned because he has always been on 20mg  daily (one-half of a 40mg  tab).  He does not want to take the 5mg  unless Dr [name] tells him to decrease.  In looking back through his chart, I do not see any documentation of a decrease in dose.  I told him to take the 20mg  and I would make sure it is documented in the chart.  I also told him I would make sure that this note is sent to Dr [name] for review in case he changed the dose.

## 2012-06-07 NOTE — Telephone Encounter (Signed)
New msg Pt wants to know why his lisinopril was changed from 40 mg to 10 mg when he went to go to [facility] to pick up. Please let him know

## 2012-06-09 NOTE — Telephone Encounter (Signed)
He should continue to take a total of 20 mg of lisinopril a day.

## 2012-06-10 ENCOUNTER — Telehealth: Payer: Self-pay | Admitting: Cardiology

## 2012-06-10 ENCOUNTER — Telehealth: Payer: Self-pay | Admitting: *Deleted

## 2012-06-10 NOTE — Telephone Encounter (Signed)
Pt advised to take 20mg  of lisinopril qd--pt agrees

## 2012-06-10 NOTE — Telephone Encounter (Signed)
Fu msg [facility] following up on message of dosage of lisinopril

## 2012-06-11 ENCOUNTER — Other Ambulatory Visit: Payer: Self-pay | Admitting: *Deleted

## 2012-06-11 MED ORDER — LISINOPRIL 40 MG PO TABS: 20.0000 mg | ORAL_TABLET | Freq: Every day | ORAL | Status: DC

## 2012-08-01 ENCOUNTER — Telehealth: Payer: Self-pay | Admitting: *Deleted

## 2012-08-01 ENCOUNTER — Ambulatory Visit (INDEPENDENT_AMBULATORY_CARE_PROVIDER_SITE_OTHER): Payer: Medicare Other | Admitting: Cardiology

## 2012-08-01 ENCOUNTER — Encounter: Payer: Self-pay | Admitting: Cardiology

## 2012-08-01 VITALS — BP 132/90 | HR 112 | Ht 64.0 in | Wt 160.0 lb

## 2012-08-01 DIAGNOSIS — I4891 Unspecified atrial fibrillation: Secondary | ICD-10-CM

## 2012-08-01 DIAGNOSIS — I5032 Chronic diastolic (congestive) heart failure: Secondary | ICD-10-CM

## 2012-08-01 DIAGNOSIS — E785 Hyperlipidemia, unspecified: Secondary | ICD-10-CM

## 2012-08-01 DIAGNOSIS — I509 Heart failure, unspecified: Secondary | ICD-10-CM

## 2012-08-01 DIAGNOSIS — I2510 Atherosclerotic heart disease of native coronary artery without angina pectoris: Secondary | ICD-10-CM

## 2012-08-01 DIAGNOSIS — R0989 Other specified symptoms and signs involving the circulatory and respiratory systems: Secondary | ICD-10-CM

## 2012-08-01 LAB — BASIC METABOLIC PANEL
CO2: 24 mEq/L (ref 19–32)
GFR: 35.19 mL/min — ABNORMAL LOW (ref >60.00)
Glucose, Bld: 94 mg/dL (ref 70–99)
Potassium: 4 mEq/L (ref 3.5–5.1)
Sodium: 140 mEq/L (ref 135–145)

## 2012-08-01 MED ORDER — WARFARIN SODIUM 5 MG PO TABS: 5.0000 mg | ORAL_TABLET | Freq: Every day | ORAL | Status: DC

## 2012-08-01 MED ORDER — METOPROLOL SUCCINATE ER 50 MG PO TB24: 50.0000 mg | ORAL_TABLET | Freq: Every day | ORAL | Status: DC

## 2012-08-01 MED ORDER — AMIODARONE HCL 200 MG PO TABS: ORAL_TABLET | ORAL | Status: DC

## 2012-08-01 MED ORDER — FUROSEMIDE 40 MG PO TABS: ORAL_TABLET | ORAL | Status: DC

## 2012-08-01 MED ORDER — RIVAROXABAN 15 MG PO TABS: 15.0000 mg | ORAL_TABLET | Freq: Every day | ORAL | Status: DC

## 2012-08-01 NOTE — Progress Notes (Signed)
Patient ID: [name] [name], male   DOB: Mar 01, 1930, 78 y.o.   MRN: [id number] PCP: Dr. Linford Arnold  78 yo with history of CAD s/p CABG, ulcerative colitis s/p colectomy, and diastolic CHF presents for cardiology followup.  He had CABG in 10/11 complicated by post-operative atrial fibrillation.  EF was 50% on most recent echo in 1/12.  He was started on amiodarone while in the hospital after CABG and this was continued due to risk of further atrial fibrillation in the setting of significantly enlarged left atrium.  He was admitted to the hospital in [place] in 7/12 with a diastolic CHF exacerbation and was diuresed.  I had him stop amiodarone in 2012 given no atrial fibrillation recurrence.    In 7/13, he began to note increased exertional dyspnea. He has only been able to walk for about 1/2 mile on the treadmill before getting short of breath.  He gets short of breath walking up hills or stairs.  He has noted that his heart rate has been elevated.  He went to the hospital in [place] last month with a diastolic CHF exacerbation.  I am not sure if he was in atrial fibrillation at that time as I have no records of that hospitalization.  It sounds like he was only in the hospital for 2-3 days.  He had an echo done while there and was told that it was "fine."  He denies orthopnea or PND.    Today, he is in atrial fibrillation with rate in the 110s.  His weight is actually lower than it was at last appointment here.      ECG: atrial fibrillation at 112, RBBB  Labs (9/12): LDL 81, HDL 54, K 4.2, creatinine 1.9, BNP 1065 Labs (11/12): K 4.1, creatinine 1.7, BNP 837 Labs (1/13): K 3.7, creatinine 1.6, HDL 52, LDL 100 Labs (5/13): K 4.4, creatinine 1.5, HDL 47, LDL 76  PMH: 1. Paroxysmal atrial fibrillation: Noted only post-op CABG in 10/11.  Was not put on coumadin.  Initially on amiodarone but this was stopped. Back in atrial fibrillation in 8/13.  2. Knee osteoarthritis 3. Anemia of uncertain  etiology: had erythropoietin shots for a period of time.  4. CAD: MI at age 67.  Had CABG in 10/11 with LIMA-LAD, SVG-D, and SVG-RCA.  5. Diastolic CHF: Echo (1/12) with EF 50%, inferior and inferoseptal hypokinesis, mild LV hypertrophy, moderate MR, severe left atrial enlargement.  6. Ulcerative colitis: s/p total colectomy with ileostomy.  7. HTN 8. Hyperlipidemia 9. Nephrolithiasis 10. CKD: H/o ARF in the setting of dehydration.   SH: Retired from [employer].  Married, lives in [place]. Stopped smoking in 1970.    FH: Strong history of CAD.  Father with MI at 62.  All siblings (11) have now passed away, many with heart disease.   ROS: All systems reviewed and negative except as per HPI.   Current Outpatient Prescriptions  Medication Sig Dispense Refill  . atorvastatin (LIPITOR) 40 MG tablet Take 40 mg by mouth daily.      . Cholecalciferol (VITAMIN D-3 PO) Take 1,000 Units by mouth daily.       . ferrous sulfate 325 (65 FE) MG tablet Take 325 mg by mouth daily with breakfast.       . fish oil-omega-3 fatty acids 1000 MG capsule Take 2 g by mouth daily.       . Flaxseed, Linseed, (FLAX SEED OIL PO) Take 1 capsule by mouth daily.       . folic  acid (FOLVITE) 400 MCG tablet Take 400 mcg by mouth daily.       [name] lisinopril (PRINIVIL,ZESTRIL) 40 MG tablet Take 0.5 tablets (20 mg total) by mouth daily.  45 tablet  2  . Multiple Vitamin (MULITIVITAMIN WITH MINERALS) TABS Take 1 tablet by mouth daily.      . Multiple Vitamins-Minerals (ICAPS PO) Take by mouth.      . Tamsulosin HCl (FLOMAX) 0.4 MG CAPS Take 0.4 mg by mouth daily.       [name] DISCONTD: furosemide (LASIX) 40 MG tablet Take 40 mg by mouth daily.       [name] DISCONTD: potassium chloride SA (K-DUR,KLOR-CON) 20 MEQ tablet Take 10-20 mEq by mouth See admin instructions. Takes 1 tablet on Mondays, Wednesdays, and Fridays and takes 0.5 tablet on all other days      . amiodarone (PACERONE) 200 MG tablet 1 two times a day for one  week then 1  daily-stay on 1 daily.  60 tablet  6  . furosemide (LASIX) 40 MG tablet 1 in the am  and 1/2 about 4-5 pm  50 tablet  6  . metoprolol succinate (TOPROL-XL) 50 MG 24 hr tablet Take 1 tablet (50 mg total) by mouth 2 (two) times daily. Take with or immediately following a meal.      . potassium chloride SA (K-DUR,KLOR-CON) 20 MEQ tablet 1 in the am and 1/2 about 4-5pm      . warfarin (COUMADIN) 5 MG tablet Take 1 tablet (5 mg total) by mouth daily.  30 tablet  0  . DISCONTD: metoprolol succinate (TOPROL-XL) 50 MG 24 hr tablet Take 1 tablet (50 mg total) by mouth daily. Take with or immediately following a meal.  60 tablet  6  . DISCONTD: pantoprazole (PROTONIX) 40 MG tablet Take 40 mg by mouth daily.          BP 132/90  Pulse 112  Ht 5\' 4"  (1.626 m)  Wt 160 lb (72.576 kg)  BMI 27.46 kg/m2 General: NAD Neck: JVP 9-10 cm, no thyromegaly or thyroid nodule.  Lungs: Clear to auscultation bilaterally with normal respiratory effort. CV: Nondisplaced PMI.  Heart irregular S1/S2, no S3/S4, 2/6 HSM at apex. 1+ edema to knees bilaterally.  No carotid bruit.  Abdomen: Soft, nontender, no hepatosplenomegaly, no distention.  Neurologic: Alert and oriented x 3.  Psych: Normal affect. Extremities: No clubbing or cyanosis.

## 2012-08-01 NOTE — Assessment & Plan Note (Signed)
Patient is back in atrial fibrillation today.  He is more dyspneic than in the past, NYHA class IIIa symptoms.  I am unsure whether he was in atrial fibrillation when he was admitted at [facility] in 7/13 but this could have triggered his diastolic CHF exacerbation.   - Considerable CVA risk.  I considered started him on renally adjusted Xarelto but it would be too expensive for him.  Instead, I will start him on coumadin.  When INR is therapeutic, aspirin can be stopped.  - As I think that his CHF has been worsened by atrial fibrillation, I will plan DCCV after 4 wks of therapeutic INR.  - I am going to restart amiodarone to try to hold him in NSR when he is cardioverted.  I will start it at 200 mg bid x 10 days followed by 200 mg daily.  He will need baseline PFTs and I will need to follow TSH and LFTs.  - Stop metoprolol, start Toprol XL 50 mg bid for better rate control.

## 2012-08-01 NOTE — Assessment & Plan Note (Signed)
Stable without chest pain.  Can stop ASA when INR is therapeutic on coumadin.

## 2012-08-01 NOTE — Assessment & Plan Note (Signed)
NYHA class III symptoms with volume overload on exam.  I will increase Lasix to 40 qam, 20 qpm and KCl to 20 qam, 10 qpm.  I will get a BMET and BNP today (creatinine elevated on last BMET).  If creatinine is higher would hold lisinopril.  Repeat BMET next week while on higher dose of Lasix. As above, I am concerned that afib/RVR has triggered CHF exacerbation.  Will plan cardioversion after he has been on coumadin for the appropriate amount of time.

## 2012-08-01 NOTE — Patient Instructions (Addendum)
Stop metoprolol tartrate.   Start metoprolol succinate (toprol XL) 50mg  twice a day  Start coumadin 5mg  daily.   Continue aspirin until you return for your coumadin check in 1 week. It will probably be discontinued at that  Time.  Schedule an appointment in the [facility] in 1 week.  Increase lasix (furosemide) to 40mg  in the morning and 20mg  about 4-5 pm. . This will be one 40mg  tablet  in the morning and one-half 40mg  tablet about 4-5pm.  Increase KCL(potassium) to 20 mEq in the morning and 10 mEq about 4-5pm.   Start amiodarone 200mg  twice a day for 1 week, then decrease to 200mg  daily. Stay on 200mg  daily.  Your physician recommends that you have  lab work today--BMET/BNP  Your physician recommends that you return for lab work in: 2 weeks--BMET/BNP.  Your physician recommends that you schedule a follow-up appointment in: 2 weeks with Dr [name].

## 2012-08-01 NOTE — Assessment & Plan Note (Signed)
LDL near goal when recently checked (goal < 70).

## 2012-08-02 ENCOUNTER — Telehealth: Payer: Self-pay | Admitting: *Deleted

## 2012-08-02 NOTE — Telephone Encounter (Signed)
Lab results given to pt.  He agrees to stop lisinopril and have repeat bmet next week per Dr [name].  BMET was ordered and appt scheduled.

## 2012-08-02 NOTE — Telephone Encounter (Signed)
Started in error

## 2012-08-02 NOTE — Telephone Encounter (Signed)
New problem    Returning call back to nurse.   

## 2012-08-08 ENCOUNTER — Other Ambulatory Visit (INDEPENDENT_AMBULATORY_CARE_PROVIDER_SITE_OTHER): Payer: Medicare Other

## 2012-08-08 ENCOUNTER — Ambulatory Visit (INDEPENDENT_AMBULATORY_CARE_PROVIDER_SITE_OTHER): Payer: Medicare Other | Admitting: Pharmacist

## 2012-08-08 DIAGNOSIS — I5032 Chronic diastolic (congestive) heart failure: Secondary | ICD-10-CM

## 2012-08-08 DIAGNOSIS — I4891 Unspecified atrial fibrillation: Secondary | ICD-10-CM

## 2012-08-08 LAB — BASIC METABOLIC PANEL
BUN: 33 mg/dL — ABNORMAL HIGH (ref 6–23)
Chloride: 104 mEq/L (ref 96–112)
Glucose, Bld: 98 mg/dL (ref 70–99)
Potassium: 3.9 mEq/L (ref 3.5–5.1)

## 2012-08-12 ENCOUNTER — Telehealth: Payer: Self-pay | Admitting: *Deleted

## 2012-08-12 NOTE — Telephone Encounter (Signed)
[facility] in [place] [phone] to get a copy of echocardiogram done there in May 2013.

## 2012-08-15 ENCOUNTER — Ambulatory Visit (INDEPENDENT_AMBULATORY_CARE_PROVIDER_SITE_OTHER): Payer: Medicare Other

## 2012-08-15 ENCOUNTER — Encounter: Payer: Self-pay | Admitting: *Deleted

## 2012-08-15 ENCOUNTER — Other Ambulatory Visit (INDEPENDENT_AMBULATORY_CARE_PROVIDER_SITE_OTHER): Payer: Medicare Other

## 2012-08-15 ENCOUNTER — Encounter: Payer: Self-pay | Admitting: Cardiology

## 2012-08-15 ENCOUNTER — Ambulatory Visit (INDEPENDENT_AMBULATORY_CARE_PROVIDER_SITE_OTHER): Payer: Medicare Other | Admitting: Cardiology

## 2012-08-15 VITALS — BP 124/72 | HR 67 | Ht 64.0 in | Wt 163.0 lb

## 2012-08-15 DIAGNOSIS — I509 Heart failure, unspecified: Secondary | ICD-10-CM

## 2012-08-15 DIAGNOSIS — I059 Rheumatic mitral valve disease, unspecified: Secondary | ICD-10-CM

## 2012-08-15 DIAGNOSIS — N189 Chronic kidney disease, unspecified: Secondary | ICD-10-CM | POA: Insufficient documentation

## 2012-08-15 DIAGNOSIS — I4891 Unspecified atrial fibrillation: Secondary | ICD-10-CM

## 2012-08-15 DIAGNOSIS — I340 Nonrheumatic mitral (valve) insufficiency: Secondary | ICD-10-CM

## 2012-08-15 DIAGNOSIS — I2510 Atherosclerotic heart disease of native coronary artery without angina pectoris: Secondary | ICD-10-CM

## 2012-08-15 DIAGNOSIS — R0989 Other specified symptoms and signs involving the circulatory and respiratory systems: Secondary | ICD-10-CM

## 2012-08-15 DIAGNOSIS — E785 Hyperlipidemia, unspecified: Secondary | ICD-10-CM

## 2012-08-15 DIAGNOSIS — I5022 Chronic systolic (congestive) heart failure: Secondary | ICD-10-CM

## 2012-08-15 MED ORDER — POTASSIUM CHLORIDE CRYS ER 20 MEQ PO TBCR: EXTENDED_RELEASE_TABLET | ORAL | Status: DC

## 2012-08-15 NOTE — Assessment & Plan Note (Signed)
Severe on most recent echo.  Atrial fibrillation can certainly lead to worsened MR via LA and mitral annular dilatation.  I will work on treating his heart failure and converting him to NSR.  Will repeat echo when in NSR and diuresed to reassess EF and mitral regurgitation.

## 2012-08-15 NOTE — Assessment & Plan Note (Signed)
LDL near goal when recently checked (goal < 70).  

## 2012-08-15 NOTE — Patient Instructions (Addendum)
Increase lasix(furosemide) to 40mg  twice a day.  Start lisinopril 2.5mg  daily.  Your physician recommends that you return for lab work next week when you have your protime done 08/22/12.  BMET/BNP/Liver profile/TSH You do not need lab today.  Your physician has requested that you have a lexiscan myoview. For further information please visit [email or web address] Please follow instruction sheet, as given.   Your physician has recommended that you have a Cardioversion (DCCV). Electrical Cardioversion uses a jolt of electricity to your heart either through paddles or wired patches attached to your chest. This is a controlled, usually prescheduled, procedure. Defibrillation is done under light anesthesia in the hospital, and you usually go home the day of the procedure. This is done to get your heart back into a normal rhythm. You are not awake for the procedure. Please see the instruction sheet given to you today. September 3,2013  Schedule a protime to be done ON THE DAY  of the cardioversion scheduled for  08/27/12 before you go to the hospital for the cardioversion.   Your physician recommends that you schedule a follow-up appointment in: 1 week after cardioversion scheduled for September 3,2013.

## 2012-08-15 NOTE — Assessment & Plan Note (Signed)
Patient remains in atrial fibrillation today.  He is more dyspneic than in the past, NYHA class IIIa symptoms.  I am unsure whether he was in atrial fibrillation when he was admitted at [facility] in 7/13 but this could have triggered his CHF exacerbation.  It is possible that his cardiomyopathy is tachycardia-mediated.  - He has now been on coumadin, INR has been therapeutic over the last 3 wks.   - As I think that his CHF has been worsened by atrial fibrillation, I will plan DCCV after 4 wks of therapeutic INR => will plan for 9/3 if INR remains therapeutic.  - Continue amiodarone loading.  Will need to check TSH and LFTs with next labs.   - Rate control reasonable on current dose of Toprol XL.

## 2012-08-15 NOTE — Assessment & Plan Note (Signed)
Will need to follow creatinine carefully with diuresis.  Given significant volume overload, renal function may improve with diuresis and lowering of renal venous pressure.

## 2012-08-15 NOTE — Assessment & Plan Note (Signed)
Status post CABG.  As above, will get [employer]

## 2012-08-15 NOTE — Progress Notes (Signed)
Patient ID: [name] [name], male   DOB: March 23, 1930, 78 y.o.   MRN: [id number] PCP: Dr. Linford [name]  78 yo with history of CAD s/p CABG, ulcerative colitis s/p colectomy, and CHF presents for cardiology followup.  He had CABG in 10/11 complicated by post-operative atrial fibrillation.  EF was 50% on most recent echo in our system in 1/12.  He was started on amiodarone while in the hospital after CABG and this was continued due to risk of further atrial fibrillation in the setting of significantly enlarged left atrium.  He was admitted to the hospital in [place] in 7/12 with a diastolic CHF exacerbation and was diuresed.  I had him stop amiodarone in 2012 given no atrial fibrillation recurrence.    In 7/13, he began to note increased exertional dyspnea.  He went to the hospital in [place] in 7/13 with a diastolic CHF exacerbation.  I am not sure if he was in atrial fibrillation at that time as I have no records of that hospitalization.  It sounds like he was only in the hospital for 2-3 days.  He had an echo done while there and was told that it was "fine."  However, I just obtained a copy of the echo report and EF was read as 35-40% with severe MR, PA systolic pressure 50-60 mmHg.   At last appointment, [name] [name] was in atrial fibrillation with RVR.  I started him on coumadin (NOACs not covered by his insurance) and Toprol XL.  He remains in atrial fibrillation today, but rate is better controlled.  At last appointment, he was also noted to have elevated creatinine so ACEI was stopped.  I increased his Lasix to 40 qam, 20 qpm.  His weight, however, is up 3 lbs.  Since I last saw him, he has been active.  He exercises most days either walking 1/2 - 1 mile outdoors or on the treadmill at the [facility].  He noted more dyspnea than in the past and walks slower.  No orthopnea or PND.  No chest pain.    Labs (9/12): LDL 81, HDL 54, K 4.2, creatinine 1.9, BNP 1065 Labs (11/12): K 4.1, creatinine 1.7, BNP 837 Labs  (1/13): K 3.7, creatinine 1.6, HDL 52, LDL 100 Labs (5/13): K 4.4, creatinine 1.5, HDL 47, LDL 76 Labs (7/13): creatinine 2.0 Labs (8/13): K 3.9, creatinine 1.9, BNP 1066  PMH: 1. Paroxysmal atrial fibrillation: Noted only post-op CABG in 10/11.  Was not put on coumadin.  Initially on amiodarone but this was stopped. Back in atrial fibrillation in 8/13, amiodarone restarted pending plans for cardioversion.  2. Knee osteoarthritis 3. Anemia of uncertain etiology: had erythropoietin shots for a period of time.  4. CAD: MI at age 71.  Had CABG in 10/11 with LIMA-LAD, SVG-D, and SVG-RCA.  5. Systolic CHF: Echo (1/12) with EF 50%, inferior and inferoseptal hypokinesis, mild LV hypertrophy, moderate [name], severe left atrial enlargement.  Echo [facility] hospital, 7/13): EF 35-40%, severe [name], moderate-severe LAE, PASP 50-60 mmHg.  6. Ulcerative colitis: s/p total colectomy with ileostomy.  7. HTN 8. Hyperlipidemia 9. Nephrolithiasis 10. CKD: H/o ARF in the setting of dehydration.  11. [name]: Severe on 7/13 echo.   SH: Retired from [employer].  Married, lives in [place]. Stopped smoking in 1970.    FH: Strong history of CAD.  Father with MI at 23.  All siblings (11) have now passed away, many with heart disease.   ROS: All systems reviewed and negative except as per HPI.  Current Outpatient Prescriptions  Medication Sig Dispense Refill  . amiodarone (PACERONE) 200 MG tablet 1 two times a day for one  week then 1 daily-stay on 1 daily.  60 tablet  6  . atorvastatin (LIPITOR) 40 MG tablet Take 40 mg by mouth daily.      . Cholecalciferol (VITAMIN D-3 PO) Take 1,000 Units by mouth daily.       . ferrous sulfate 325 (65 FE) MG tablet Take 325 mg by mouth daily with breakfast.       . fish oil-omega-3 fatty acids 1000 MG capsule Take 2 g by mouth daily.       . Flaxseed, Linseed, (FLAX SEED OIL PO) Take 1 capsule by mouth daily.       . folic acid (FOLVITE) 400 MCG tablet Take 400 mcg by mouth  daily.       . metoprolol succinate (TOPROL-XL) 50 MG 24 hr tablet Take 1 tablet (50 mg total) by mouth 2 (two) times daily. Take with or immediately following a meal.      . Multiple Vitamin (MULITIVITAMIN WITH MINERALS) TABS Take 1 tablet by mouth daily.      . Multiple Vitamins-Minerals (ICAPS PO) Take by mouth.      . potassium chloride SA (K-DUR,KLOR-CON) 20 MEQ tablet 1 in the am and 1/2 about 4-5pm  180 tablet  3  . Tamsulosin HCl (FLOMAX) 0.4 MG CAPS Take 0.4 mg by mouth daily.       [name] warfarin (COUMADIN) 5 MG tablet Take 1 tablet (5 mg total) by mouth daily.  30 tablet  0  . DISCONTD: furosemide (LASIX) 40 MG tablet 1 in the am  and 1/2 about 4-5 pm  50 tablet  6  . DISCONTD: potassium chloride SA (K-DUR,KLOR-CON) 20 MEQ tablet 1 in the am and 1/2 about 4-5pm      . furosemide (LASIX) 40 MG tablet Take 1 tablet (40 mg total) by mouth 2 (two) times daily.      [name] lisinopril (ZESTRIL) 2.5 MG tablet Take 1 tablet (2.5 mg total) by mouth daily.      [name] DISCONTD: lisinopril (PRINIVIL,ZESTRIL) 40 MG tablet Take 0.5 tablets (20 mg total) by mouth daily.  45 tablet  2  . DISCONTD: pantoprazole (PROTONIX) 40 MG tablet Take 40 mg by mouth daily.          BP 124/72  Pulse 67  Ht 5\' 4"  (1.626 m)  Wt 163 lb (73.936 kg)  BMI 27.98 kg/m2  SpO2 98% General: NAD Neck: JVP 10-12 cm, no thyromegaly or thyroid nodule.  Lungs: Clear to auscultation bilaterally with normal respiratory effort. CV: Nondisplaced PMI.  Heart irregular S1/S2, no S3/S4, 3/6 HSM at apex. 1+ ankle edema bilaterally.  No carotid bruit.  Abdomen: Soft, nontender, no hepatosplenomegaly, no distention.  Neurologic: Alert and oriented x 3.  Psych: Normal affect. Extremities: No clubbing or cyanosis.

## 2012-08-15 NOTE — Assessment & Plan Note (Addendum)
EF 35-40% on echo done in [place].  Possible tachy-mediated cardiomyopathy but I do not know if he was in atrial fibrillation or not when he was at that hospital.  He denies chest pain.  He is remains volume overloaded on exam with NYHA class IIIa symptoms.  Symptoms are stable compared to prior appointment.  His weight is not down . - I think we need to get him out of atrial fibrillation: as above, plan for DCCV on 9/3 if INR remains therapeutic. If it does not stay therapeutic, would proceed with TEE-guided cardioversion rather than waiting additional time.   - Increase Lasix to 40 mg po bid and KCl to 20 mEq bid.  - Carefully add back lisinopril 2.5 mg daily (will need to watch creatinine closely with increased lasix and re-addition of ACEI).  - Continue Toprol XL at current dose.  - BMET/BNP in 1 week.  - Given fall in EF with CHF, I am going to have him get a [employer]  I will not cath him unless he has a significantly abnormal myoview given the elevated creatinine.

## 2012-08-21 ENCOUNTER — Other Ambulatory Visit: Payer: Self-pay | Admitting: Cardiology

## 2012-08-21 DIAGNOSIS — I4891 Unspecified atrial fibrillation: Secondary | ICD-10-CM

## 2012-08-22 ENCOUNTER — Other Ambulatory Visit: Payer: Medicare Other

## 2012-08-22 ENCOUNTER — Other Ambulatory Visit (HOSPITAL_COMMUNITY): Payer: Medicare Other

## 2012-08-23 ENCOUNTER — Telehealth: Payer: Self-pay | Admitting: Cardiology

## 2012-08-23 NOTE — Telephone Encounter (Signed)
Patient and wife are aware that pt does not need to have blood work prior Cardioversion.

## 2012-08-23 NOTE — Telephone Encounter (Signed)
Please return call to patient at hm#  He would like to know if he needs to have labs and where he should have them before his 9/3 procedure.  Pt notes he will be out the house but you are welcome to leave this information on his vm.

## 2012-08-27 ENCOUNTER — Inpatient Hospital Stay (HOSPITAL_COMMUNITY)
Admission: RE | Admit: 2012-08-27 | Discharge: 2012-08-30 | DRG: 308 | Disposition: A | Discharge status: Home / Self Care | Payer: Medicare Other | Source: Ambulatory Visit | Attending: Cardiology | Admitting: Cardiology

## 2012-08-27 ENCOUNTER — Encounter (HOSPITAL_COMMUNITY)
Admission: RE | Disposition: A | Discharge status: Home / Self Care | Payer: Self-pay | Source: Ambulatory Visit | Attending: Cardiology

## 2012-08-27 ENCOUNTER — Encounter (HOSPITAL_COMMUNITY): Payer: Self-pay | Admitting: *Deleted

## 2012-08-27 DIAGNOSIS — N189 Chronic kidney disease, unspecified: Secondary | ICD-10-CM | POA: Diagnosis present

## 2012-08-27 DIAGNOSIS — I5032 Chronic diastolic (congestive) heart failure: Secondary | ICD-10-CM

## 2012-08-27 DIAGNOSIS — I129 Hypertensive chronic kidney disease with stage 1 through stage 4 chronic kidney disease, or unspecified chronic kidney disease: Secondary | ICD-10-CM | POA: Diagnosis present

## 2012-08-27 DIAGNOSIS — I5023 Acute on chronic systolic (congestive) heart failure: Secondary | ICD-10-CM | POA: Diagnosis present

## 2012-08-27 DIAGNOSIS — I059 Rheumatic mitral valve disease, unspecified: Secondary | ICD-10-CM | POA: Diagnosis present

## 2012-08-27 DIAGNOSIS — I509 Heart failure, unspecified: Secondary | ICD-10-CM | POA: Diagnosis present

## 2012-08-27 DIAGNOSIS — I252 Old myocardial infarction: Secondary | ICD-10-CM

## 2012-08-27 DIAGNOSIS — I4891 Unspecified atrial fibrillation: Principal | ICD-10-CM | POA: Diagnosis present

## 2012-08-27 DIAGNOSIS — E785 Hyperlipidemia, unspecified: Secondary | ICD-10-CM

## 2012-08-27 DIAGNOSIS — N179 Acute kidney failure, unspecified: Secondary | ICD-10-CM | POA: Diagnosis present

## 2012-08-27 DIAGNOSIS — Z951 Presence of aortocoronary bypass graft: Secondary | ICD-10-CM

## 2012-08-27 DIAGNOSIS — I5022 Chronic systolic (congestive) heart failure: Secondary | ICD-10-CM

## 2012-08-27 HISTORY — DX: Heart failure, unspecified: I50.9

## 2012-08-27 LAB — CBC WITH DIFFERENTIAL/PLATELET
Basophils Relative: 1 % (ref 0–1)
Eosinophils Absolute: 0.1 10*3/uL (ref 0.0–0.7)
Eosinophils Relative: 1 % (ref 0–5)
MCH: 28.9 pg (ref 26.0–34.0)
MCHC: 32.8 g/dL (ref 30.0–36.0)
MCV: 88.1 fL (ref 78.0–100.0)
Monocytes Relative: 10 % (ref 3–12)
Neutrophils Relative %: 76 % (ref 43–77)
Platelets: 130 10*3/uL — ABNORMAL LOW (ref 150–400)

## 2012-08-27 LAB — PRO B NATRIURETIC PEPTIDE: Pro B Natriuretic peptide (BNP): 28576 pg/mL — ABNORMAL HIGH (ref 0–450)

## 2012-08-27 LAB — COMPREHENSIVE METABOLIC PANEL
ALT: 16 U/L (ref 0–53)
Calcium: 9.9 mg/dL (ref 8.4–10.5)
Creatinine, Ser: 2.14 mg/dL — ABNORMAL HIGH (ref 0.50–1.35)
GFR calc Af Amer: 32 mL/min — ABNORMAL LOW (ref >90)
Glucose, Bld: 99 mg/dL (ref 70–99)
Sodium: 137 mEq/L (ref 135–145)
Total Protein: 6.7 g/dL (ref 6.0–8.3)

## 2012-08-27 LAB — PROTIME-INR: Prothrombin Time: 22.1 seconds — ABNORMAL HIGH (ref 11.6–15.2)

## 2012-08-27 SURGERY — CARDIOVERSION
Anesthesia: Monitor Anesthesia Care

## 2012-08-27 MED ORDER — WARFARIN SODIUM 4 MG PO TABS
4.0000 mg | ORAL_TABLET | Freq: Once | ORAL | Status: AC
  Administered 2012-08-27: 4 mg via ORAL
  Filled 2012-08-27: qty 1

## 2012-08-27 MED ORDER — SODIUM CHLORIDE 0.9 % IV SOLN: 250.0000 mL | INTRAVENOUS | Status: DC

## 2012-08-27 MED ORDER — WARFARIN - PHARMACIST DOSING INPATIENT: Freq: Every day | Status: DC

## 2012-08-27 MED ORDER — ADULT MULTIVITAMIN W/MINERALS CH
1.0000 | ORAL_TABLET | Freq: Every day | ORAL | Status: DC
  Administered 2012-08-27 – 2012-08-30 (×4): 1 via ORAL
  Filled 2012-08-27 (×4): qty 1

## 2012-08-27 MED ORDER — SODIUM CHLORIDE 0.9 % IJ SOLN
3.0000 mL | Freq: Two times a day (BID) | INTRAMUSCULAR | Status: DC
  Administered 2012-08-30: 3 mL via INTRAVENOUS

## 2012-08-27 MED ORDER — AMIODARONE HCL 200 MG PO TABS
200.0000 mg | ORAL_TABLET | Freq: Two times a day (BID) | ORAL | Status: DC
  Administered 2012-08-27 – 2012-08-28 (×3): 200 mg via ORAL
  Filled 2012-08-27 (×5): qty 1

## 2012-08-27 MED ORDER — LISINOPRIL 2.5 MG PO TABS
2.5000 mg | ORAL_TABLET | Freq: Every day | ORAL | Status: DC
  Administered 2012-08-27 – 2012-08-30 (×4): 2.5 mg via ORAL
  Filled 2012-08-27 (×4): qty 1

## 2012-08-27 MED ORDER — FUROSEMIDE 10 MG/ML IJ SOLN: 40.0000 mg | Freq: Three times a day (TID) | INTRAMUSCULAR | Status: DC

## 2012-08-27 MED ORDER — TAMSULOSIN HCL 0.4 MG PO CAPS
0.4000 mg | ORAL_CAPSULE | Freq: Every day | ORAL | Status: DC
  Administered 2012-08-27 – 2012-08-30 (×4): 0.4 mg via ORAL
  Filled 2012-08-27 (×4): qty 1

## 2012-08-27 MED ORDER — SODIUM CHLORIDE 0.9 % IV SOLN
INTRAVENOUS | Status: DC
  Administered 2012-08-27: 20 mL via INTRAVENOUS
  Administered 2012-08-29: 01:00:00 via INTRAVENOUS

## 2012-08-27 MED ORDER — ACETAMINOPHEN 325 MG PO TABS
650.0000 mg | ORAL_TABLET | ORAL | Status: DC | PRN
  Administered 2012-08-28 – 2012-08-30 (×2): 650 mg via ORAL
  Filled 2012-08-27 (×2): qty 2

## 2012-08-27 MED ORDER — NITROGLYCERIN 0.4 MG SL SUBL: 0.4000 mg | SUBLINGUAL_TABLET | SUBLINGUAL | Status: DC | PRN

## 2012-08-27 MED ORDER — METOPROLOL SUCCINATE ER 50 MG PO TB24
75.0000 mg | ORAL_TABLET | Freq: Two times a day (BID) | ORAL | Status: DC
  Administered 2012-08-27 – 2012-08-29 (×4): 75 mg via ORAL
  Filled 2012-08-27 (×5): qty 1

## 2012-08-27 MED ORDER — ASPIRIN EC 81 MG PO TBEC
81.0000 mg | DELAYED_RELEASE_TABLET | Freq: Every day | ORAL | Status: DC
  Administered 2012-08-28 – 2012-08-30 (×3): 81 mg via ORAL
  Filled 2012-08-27 (×3): qty 1

## 2012-08-27 MED ORDER — ONDANSETRON HCL 4 MG/2ML IJ SOLN: 4.0000 mg | Freq: Four times a day (QID) | INTRAMUSCULAR | Status: DC | PRN

## 2012-08-27 MED ORDER — SODIUM CHLORIDE 0.9 % IJ SOLN
3.0000 mL | Freq: Two times a day (BID) | INTRAMUSCULAR | Status: DC
  Administered 2012-08-28 (×2): 3 mL via INTRAVENOUS

## 2012-08-27 MED ORDER — ATORVASTATIN CALCIUM 40 MG PO TABS
40.0000 mg | ORAL_TABLET | Freq: Every day | ORAL | Status: DC
  Administered 2012-08-27 – 2012-08-30 (×4): 40 mg via ORAL
  Filled 2012-08-27 (×4): qty 1

## 2012-08-27 MED ORDER — FUROSEMIDE 10 MG/ML IJ SOLN
40.0000 mg | Freq: Three times a day (TID) | INTRAMUSCULAR | Status: DC
  Administered 2012-08-27 – 2012-08-30 (×9): 40 mg via INTRAVENOUS
  Filled 2012-08-27 (×11): qty 4

## 2012-08-27 MED ORDER — FERROUS SULFATE 325 (65 FE) MG PO TABS
325.0000 mg | ORAL_TABLET | Freq: Every day | ORAL | Status: DC
  Administered 2012-08-28 – 2012-08-29 (×2): 325 mg via ORAL
  Filled 2012-08-27 (×5): qty 1

## 2012-08-27 MED ORDER — SODIUM CHLORIDE 0.9 % IJ SOLN: 3.0000 mL | INTRAMUSCULAR | Status: DC | PRN

## 2012-08-27 MED ORDER — SODIUM CHLORIDE 0.9 % IV SOLN: 250.0000 mL | INTRAVENOUS | Status: DC | PRN

## 2012-08-27 NOTE — Progress Notes (Signed)
Cardioversion rescheduled to 08/28/12 due to INR too low at 1.9.  TEE will be done 08/28/12 before cardioversion.

## 2012-08-27 NOTE — Anesthesia Preprocedure Evaluation (Addendum)
Anesthesia Evaluation  Patient identified by MRN, date of birth, ID band Patient awake    Reviewed: Allergy & Precautions, H&P , NPO status   Airway Mallampati: II TM Distance: >3 FB Neck ROM: Full    Dental  (+) Dental Advisory Given   Pulmonary  breath sounds clear to auscultation        Cardiovascular hypertension, Pt. on medications + CAD, + Past MI and +CHF + dysrhythmias Atrial Fibrillation Rhythm:Irregular Rate:Normal     Neuro/Psych    GI/Hepatic PUD,   Endo/Other    Renal/GU CRFRenal disease     Musculoskeletal   Abdominal   Peds  Hematology   Anesthesia Other Findings   Reproductive/Obstetrics                        Anesthesia Physical Anesthesia Plan  ASA: III  Anesthesia Plan: General   Post-op Pain Management:    Induction: Intravenous  Airway Management Planned: Mask  Additional Equipment:   Intra-op Plan:   Post-operative Plan:   Informed Consent: I have reviewed the patients History and Physical, chart, labs and discussed the procedure including the risks, benefits and alternatives for the proposed anesthesia with the patient or authorized representative who has indicated his/her understanding and acceptance.   Dental advisory given  Plan Discussed with:   Anesthesia Plan Comments:         Anesthesia Quick Evaluation

## 2012-08-27 NOTE — H&P (Signed)
Physician History and Physical    [name] [name] MRN: [id number] DOB/AGE: 04/04/1930 78 y.o. Admit date: 08/27/2012  78 yo with history of CAD s/p CABG, ulcerative colitis s/p colectomy, and CHF is admitted with atrial fibrillation and CHF exacerbation.  He has an extensive cardiac history. He had CABG in 10/11 complicated by post-operative atrial fibrillation. EF was 50% on most recent echo in our system in 1/12. He was started on amiodarone while in the hospital after CABG and this was continued due to risk of further atrial fibrillation in the setting of significantly enlarged left atrium.   He was admitted to the hospital in [place] in 7/12 with a diastolic CHF exacerbation and was diuresed. I had him stop amiodarone in 2012 given no atrial fibrillation recurrence.   In 7/13, he began to note increased exertional dyspnea. He went to the hospital in [place] in 7/13 with a diastolic CHF exacerbation. I am not sure if he was in atrial fibrillation at that time as I have no records of that hospitalization. It sounds like he was only in the hospital for 2-3 days. He had an echo done while there on which EF was read as 35-40% with severe MR, PA systolic pressure 50-60 mmHg.   At last appointment, Mr Cavins was in atrial fibrillation with RVR.  He has been started on coumadin and Toprol XL for rate control.  Lasix was adjusted and he was started on amiodarone in preparation for cardioversion.  However, he went back into the hospital at [facility] about a week ago with PND and orthopnea.  He was kept for 2 days, diuresed, and sent home.  He presented today for DC cardioversion, but INR today was 1.9.  He reports ongoing dyspnea with minimal exertion, no dyspnea at rest.  No chest pain.  He denies further PND.  He sleeps on 1 pillow.  His exertional symptoms are a marked change from 6 months ago.   PMH:  1. Paroxysmal atrial fibrillation: Noted only post-op CABG in 10/11. Was not put on  coumadin. Initially on amiodarone but this was stopped. Back in atrial fibrillation in 8/13, amiodarone restarted pending plans for cardioversion.  2. Knee osteoarthritis  3. Anemia of uncertain etiology: had erythropoietin shots for a period of time.  4. CAD: MI at age 11. Had CABG in 10/11 with LIMA-LAD, SVG-D, and SVG-RCA.  5. Systolic CHF: Echo (1/12) with EF 50%, inferior and inferoseptal hypokinesis, mild LV hypertrophy, moderate MR, severe left atrial enlargement. Echo [facility], 7/13): EF 35-40%, severe MR, moderate-severe LAE, PASP 50-60 mmHg.  6. Ulcerative colitis: s/p total colectomy with ileostomy.  7. HTN  8. Hyperlipidemia  9. Nephrolithiasis  10. CKD: H/o ARF in the setting of dehydration.  11. MR: Severe on 7/13 echo.   SH: Retired from [employer]. Married, lives in [place]. Stopped smoking in 1970.   FH: Strong history of CAD. Father with MI at 18. All siblings (11) have now passed away, many with heart disease.   ROS: All systems reviewed and negative except as per HPI.    Prescriptions prior to admission  Medication Sig Dispense Refill  . amiodarone (PACERONE) 200 MG tablet 1 two times a day for one  week then 1 daily-stay on 1 daily.  60 tablet  6  . atorvastatin (LIPITOR) 40 MG tablet Take 40 mg by mouth daily.      . Cholecalciferol (VITAMIN D-3 PO) Take 1,000 Units by mouth daily.       [name]  ferrous sulfate 325 (65 FE) MG tablet Take 325 mg by mouth daily with breakfast.       . fish oil-omega-3 fatty acids 1000 MG capsule Take 2 g by mouth daily.       . Flaxseed, Linseed, (FLAX SEED OIL PO) Take 1 capsule by mouth daily.       . folic acid (FOLVITE) 400 MCG tablet Take 400 mcg by mouth daily.       . furosemide (LASIX) 40 MG tablet Take 1 tablet (40 mg total) by mouth 2 (two) times daily.      [name] lisinopril (ZESTRIL) 2.5 MG tablet Take 1 tablet (2.5 mg total) by mouth daily.      . metoprolol succinate (TOPROL-XL) 50 MG 24 hr tablet Take 1 tablet (50  mg total) by mouth 2 (two) times daily. Take with or immediately following a meal.      . Multiple Vitamin (MULITIVITAMIN WITH MINERALS) TABS Take 1 tablet by mouth daily.      . Multiple Vitamins-Minerals (ICAPS PO) Take by mouth.      . potassium chloride SA (K-DUR,KLOR-CON) 20 MEQ tablet 1 in the am and 1/2 about 4-5pm  180 tablet  3  . Tamsulosin HCl (FLOMAX) 0.4 MG CAPS Take 0.4 mg by mouth daily.       [name] warfarin (COUMADIN) 5 MG tablet Take 1 tablet (5 mg total) by mouth daily.  30 tablet  0    Physical Exam: Blood pressure 125/80, temperature 97.9 F (36.6 C), temperature source Oral, resp. rate 14, SpO2 98.00%.  General: NAD Neck: JVP 12-14 cm, no thyromegaly or thyroid nodule.  Lungs: Crackles at bases bilaterally.  CV: Nondisplaced PMI.  Heart irregular S1/S2, no S3/S4, 3/6 HSM at apex.  1+ edema to knees bilaterally.  No carotid bruit.  Pedal pulses difficult to feel given edema.  Abdomen: Soft, nontender, no hepatosplenomegaly, no distention.  Skin: Intact without lesions or rashes.  Neurologic: Alert and oriented x 3.  Psych: Normal affect. Extremities: No clubbing or cyanosis.  HEENT: Normal.   Labs:   Lab Results  Component Value Date   WBC 9.0 04/15/2012   HGB 12.9* 04/15/2012   HCT 39.0 04/15/2012   MCV 93.1 04/15/2012   PLT 161 04/15/2012   No results found for this basename: NA,K,CL,CO2,BUN,CREATININE,CALCIUM,LABALBU,PROT,BILITOT,ALKPHOS,ALT,AST,GLUCOSE in the last 168 hours Lab Results  Component Value Date   CKTOTAL 194 08/05/2010   CKMB 9.2 CRITICAL VALUE NOTED.  VALUE IS CONSISTENT WITH PREVIOUSLY REPORTED AND CALLED VALUE.* 08/05/2010   TROPONINI  Value: 0.05        NO INDICATION OF MYOCARDIAL INJURY. 08/05/2010    Lab Results  Component Value Date   CHOL 137 05/14/2012   CHOL 143 09/04/2011   Lab Results  Component Value Date   HDL 47.00 05/14/2012   HDL 16.10 09/04/2011   Lab Results  Component Value Date   LDLCALC 76 05/14/2012   LDLCALC 81 09/04/2011     Lab Results  Component Value Date   TRIG 69.0 05/14/2012   TRIG 45.0 09/04/2011   Lab Results  Component Value Date   CHOLHDL 3 05/14/2012   CHOLHDL 3 09/04/2011   Lab Results  Component Value Date   LDLDIRECT  Value: 51 (NOTE) ATP III Classification (LDL):      < 100        mg/dL         Optimal     960 - 129     mg/dL  Near or Above Optimal     130 - 159     mg/dL         Borderline High     160 - 189     mg/dL         High      > 161        mg/dL         Very  High  0/96/0454     ASSESSMENT AND PLAN:  78 yo with history of CAD s/p CABG, atrial fibrillation with RVR and acute on chronic systolic CHF.  He was here today for planned cardioversion.  INR was subtherapeutic today, so he will need a prior TEE and probably heparin gtt coverage.  He remains quite volume overloaded with NYHA class III symptoms so I decided to admit him overnight for TEE-guided DCCV tomorrow.  I will diurese him overnight.  1. Atrial fibrillation: HR 110s-120s today.  - Amiodarone at 200 mg bid until cardioversion, if successful can decrease to daily.  Will also check LFTs and TSH as he has been on amiodarone for a couple of weeks now.  - Increase Toprol XL to 75 mg bid.  - Continue coumadin.  If INR in the am is subtherapeutic still, I will start heparin gtt.  - TEE-guided DCCV tomorrow.  2. CHF: Acute on chronic systolic CHF.  He is volume overloaded on exam with NYHA class III symptoms.  He was recently treated for CHF at an outside hospital but remains very volume overloaded.  Creatinine has been elevated (most recently 1.9 in our system). EF 35-40% on echo done in [place]. Possible tachy-mediated cardiomyopathy but I do not know if he was in atrial fibrillation or not when he was at that hospital initially. He denies chest pain.  - Lasix 40 mg IV bid, follow creatinine carefully.  - Continue Toprol XL at 75 mg bid and current dose of lisinopril (2.5 mg daily).  - Will need Lexiscan myoview at some  point, likely as outpatient, to assess for ischemia given worsening clinical picture.  - As above, CHF worsening seems to correspond to onset of atrial fibrillation.  Will try to get him out of atrial fibrillation tomorrow.  3. CKD: Creatinine has been elevated, will need to follow closely with diuresis.   Signed: [name] 08/27/2012, 4:39 PM Co-Sign MD

## 2012-08-28 ENCOUNTER — Encounter (HOSPITAL_COMMUNITY): Payer: Self-pay | Admitting: Certified Registered"

## 2012-08-28 ENCOUNTER — Encounter (HOSPITAL_COMMUNITY)
Admission: RE | Disposition: A | Discharge status: Home / Self Care | Payer: Self-pay | Source: Ambulatory Visit | Attending: Cardiology

## 2012-08-28 ENCOUNTER — Inpatient Hospital Stay (HOSPITAL_COMMUNITY): Payer: Medicare Other | Admitting: Certified Registered"

## 2012-08-28 DIAGNOSIS — I4891 Unspecified atrial fibrillation: Secondary | ICD-10-CM

## 2012-08-28 HISTORY — PX: TEE WITHOUT CARDIOVERSION: SHX5443

## 2012-08-28 HISTORY — PX: CARDIOVERSION: SHX1299

## 2012-08-28 LAB — BASIC METABOLIC PANEL
CO2: 26 mEq/L (ref 19–32)
Chloride: 106 mEq/L (ref 96–112)
Glucose, Bld: 90 mg/dL (ref 70–99)
Potassium: 3.7 mEq/L (ref 3.5–5.1)
Sodium: 142 mEq/L (ref 135–145)

## 2012-08-28 LAB — CBC
Hemoglobin: 10.4 g/dL — ABNORMAL LOW (ref 13.0–17.0)
MCH: 28.8 pg (ref 26.0–34.0)
Platelets: 132 10*3/uL — ABNORMAL LOW (ref 150–400)
RBC: 3.61 MIL/uL — ABNORMAL LOW (ref 4.22–5.81)
WBC: 6.5 10*3/uL (ref 4.0–10.5)

## 2012-08-28 LAB — TSH: TSH: 1.583 u[IU]/mL (ref 0.350–4.500)

## 2012-08-28 LAB — PROTIME-INR: Prothrombin Time: 24.3 seconds — ABNORMAL HIGH (ref 11.6–15.2)

## 2012-08-28 SURGERY — ECHOCARDIOGRAM, TRANSESOPHAGEAL
Anesthesia: General

## 2012-08-28 SURGERY — Surgical Case

## 2012-08-28 SURGERY — ECHOCARDIOGRAM, TRANSESOPHAGEAL
Anesthesia: Moderate Sedation

## 2012-08-28 MED ORDER — MIDAZOLAM HCL 10 MG/2ML IJ SOLN
INTRAMUSCULAR | Status: DC | PRN
  Administered 2012-08-28: 2 mg via INTRAVENOUS
  Administered 2012-08-28: 1 mg via INTRAVENOUS

## 2012-08-28 MED ORDER — MIDAZOLAM HCL 5 MG/ML IJ SOLN
INTRAMUSCULAR | Status: AC
  Filled 2012-08-28: qty 1

## 2012-08-28 MED ORDER — POTASSIUM CHLORIDE CRYS ER 20 MEQ PO TBCR
40.0000 meq | EXTENDED_RELEASE_TABLET | Freq: Once | ORAL | Status: AC
  Administered 2012-08-28: 40 meq via ORAL
  Filled 2012-08-28: qty 2

## 2012-08-28 MED ORDER — BUTAMBEN-TETRACAINE-BENZOCAINE 2-2-14 % EX AERO
INHALATION_SPRAY | CUTANEOUS | Status: DC | PRN
  Administered 2012-08-28: 2 via TOPICAL

## 2012-08-28 MED ORDER — FENTANYL CITRATE 0.05 MG/ML IJ SOLN
INTRAMUSCULAR | Status: DC | PRN
  Administered 2012-08-28: 25 ug via INTRAVENOUS

## 2012-08-28 MED ORDER — WARFARIN SODIUM 5 MG PO TABS
5.0000 mg | ORAL_TABLET | Freq: Once | ORAL | Status: AC
  Administered 2012-08-28: 5 mg via ORAL
  Filled 2012-08-28: qty 1

## 2012-08-28 MED ORDER — SODIUM CHLORIDE 0.9 % IV SOLN
INTRAVENOUS | Status: DC | PRN
  Administered 2012-08-28: 13:00:00 via INTRAVENOUS

## 2012-08-28 MED ORDER — FENTANYL CITRATE 0.05 MG/ML IJ SOLN
INTRAMUSCULAR | Status: AC
  Filled 2012-08-28: qty 2

## 2012-08-28 MED ORDER — POTASSIUM CHLORIDE 20 MEQ PO PACK: 40.0000 meq | PACK | Freq: Once | ORAL | Status: DC

## 2012-08-28 MED ORDER — LIDOCAINE HCL (CARDIAC) 20 MG/ML IV SOLN
INTRAVENOUS | Status: DC | PRN
  Administered 2012-08-28: 30 mg via INTRAVENOUS

## 2012-08-28 MED ORDER — PROPOFOL 10 MG/ML IV EMUL
INTRAVENOUS | Status: DC | PRN
  Administered 2012-08-28: 50 mg via INTRAVENOUS

## 2012-08-28 NOTE — Transfer of Care (Signed)
Immediate Anesthesia Transfer of Care Note  Patient: [name] [name]  Procedure(s) Performed: Procedure(s) (LRB) with comments: TRANSESOPHAGEAL ECHOCARDIOGRAM (TEE) (N/A) CARDIOVERSION (N/A)  Patient Location: Endoscopy Unit  Anesthesia Type: General  Level of Consciousness: awake, alert  and oriented  Airway & Oxygen Therapy: Patient Spontanous Breathing and Patient connected to nasal cannula oxygen  Post-op Assessment: Report given to PACU RN, Post -op Vital signs reviewed and stable and Patient moving all extremities  Post vital signs: Reviewed and stable  Complications: No apparent anesthesia complications

## 2012-08-28 NOTE — Progress Notes (Signed)
Utilization Review Completed.  

## 2012-08-28 NOTE — H&P (View-Only) (Signed)
Patient ID: [name] [name], male   DOB: 11/21/1930, 78 y.o.   MRN: [id number]    SUBJECTIVE: No dyspnea.  Diuresed well overnight.  Weight decreased 2 lbs.  No chest pain.       . amiodarone  200 mg Oral BID  . aspirin EC  81 mg Oral Daily  . atorvastatin  40 mg Oral Daily  . ferrous sulfate  325 mg Oral Q breakfast  . furosemide  40 mg Intravenous Q8H  . lisinopril  2.5 mg Oral Daily  . metoprolol succinate  75 mg Oral BID  . multivitamin with minerals  1 tablet Oral Daily  . potassium chloride  40 mEq Oral Once  . sodium chloride  3 mL Intravenous Q12H  . sodium chloride  3 mL Intravenous Q12H  . Tamsulosin HCl  0.4 mg Oral Daily  . warfarin  4 mg Oral ONCE-1800  . Warfarin - Pharmacist Dosing Inpatient   Does not apply q1800  . DISCONTD: furosemide  40 mg Intravenous Q8H  . DISCONTD: potassium chloride  40 mEq Oral Once      Filed Vitals:   08/27/12 1741 08/27/12 1848 08/27/12 2116 08/28/12 0606  BP: 113/78 106/74 100/61 102/71  Pulse: 105  104 61  Temp: 98.5 F (36.9 C)  98.3 F (36.8 C) 97.5 F (36.4 C)  TempSrc: Oral  Oral Oral  Resp: 20  19 20  Height: 5' 4" (1.626 m)     Weight: 163 lb 2.3 oz (74 kg)   161 lb 12.8 oz (73.392 kg)  SpO2:   99% 99%    Intake/Output Summary (Last 24 hours) at 08/28/12 0756 Last data filed at 08/28/12 0700  Gross per 24 hour  Intake    128 ml  Output   1475 ml  Net  -1347 ml    LABS: Basic Metabolic Panel:  Basename 08/28/12 0635 08/27/12 1756  NA 142 137  K 3.7 4.2  CL 106 103  CO2 26 23  GLUCOSE 90 99  BUN 34* 35*  CREATININE 2.11* 2.14*  CALCIUM 9.7 9.9  MG -- --  PHOS -- --   Liver Function Tests:  Basename 08/27/12 1756  AST 26  ALT 16  ALKPHOS 68  BILITOT 0.9  PROT 6.7  ALBUMIN 3.2*   No results found for this basename: LIPASE:2,AMYLASE:2 in the last 72 hours CBC:  Basename 08/28/12 0635 08/27/12 1756  WBC 6.5 7.9  NEUTROABS -- 6.0  HGB 10.4* 10.7*  HCT 31.6* 32.6*  MCV 87.5 88.1  PLT 132*  130*   Cardiac Enzymes: No results found for this basename: CKTOTAL:3,CKMB:3,CKMBINDEX:3,TROPONINI:3 in the last 72 hours BNP: No components found with this basename: POCBNP:3 D-Dimer: No results found for this basename: DDIMER:2 in the last 72 hours Hemoglobin A1C: No results found for this basename: HGBA1C in the last 72 hours Fasting Lipid Panel: No results found for this basename: CHOL,HDL,LDLCALC,TRIG,CHOLHDL,LDLDIRECT in the last 72 hours Thyroid Function Tests:  Basename 08/27/12 1756  TSH 1.583  T4TOTAL --  T3FREE --  THYROIDAB --   Anemia Panel: No results found for this basename: VITAMINB12,FOLATE,FERRITIN,TIBC,IRON,RETICCTPCT in the last 72 hours  RADIOLOGY: No results found.  PHYSICAL EXAM General: NAD Neck: JVP 12 cm, no thyromegaly or thyroid nodule.  Lungs: Clear to auscultation bilaterally with normal respiratory effort. CV: Nondisplaced PMI.  Heart irregular S1/S2, no S3/S4, 3/6 HSM.  1+ ankle edema.  No carotid bruit.   Abdomen: Soft, nontender, no hepatosplenomegaly, no distention.  Neurologic: Alert and   oriented x 3.  Psych: Normal affect. Extremities: No clubbing or cyanosis.   TELEMETRY: Reviewed telemetry pt in atrial fibrillation in the 70s  ASSESSMENT AND PLAN: 78 yo with history of CAD s/p CABG, atrial fibrillation with RVR and acute on chronic systolic CHF.  1. Atrial fibrillation: HR 70s on higher dose of Toprol XL.  - Amiodarone at 200 mg bid until cardioversion, if successful can decrease to daily. LFTs and TSH were normal. - INR is therapeutic today.  Can proceed with TEE-guided cardioversion today without bridging heparin or Lovenox. 2. CHF: Acute on chronic systolic CHF. He is volume overloaded on exam with NYHA class III symptoms. He was recently treated for CHF at an outside hospital but remains very volume overloaded. Creatinine has been elevated. EF 35-40% on echo done in [place]. Possible tachy-mediated cardiomyopathy but I do not  know if he was in atrial fibrillation or not when he was at that hospital initially. He denies chest pain.  Good diuresis so far, creatinine stable. - Continue Lasix at 40 mg IV every 8 hrs.  Still volume overloaded. - Continue Toprol XL at 75 mg bid and current dose of lisinopril (2.5 mg daily).  - Will need Lexiscan myoview at some point, likely as outpatient, to assess for ischemia given worsening clinical picture.  - As above, CHF worsening seems to correspond to onset of atrial fibrillation. Will try to get him out of atrial fibrillation today.  3. CKD: Creatinine has been elevated, will need to follow closely with diuresis. It is stable so far.   [name] [name] 08/28/2012 8:00 AM    

## 2012-08-28 NOTE — Interval H&P Note (Signed)
History and Physical Interval Note:  08/28/2012 12:58 PM  [name] [name]  has presented today for surgery, with the diagnosis of A-fib  The various methods of treatment have been discussed with the patient and family. After consideration of risks, benefits and other options for treatment, the patient has consented to  Procedure(s) (LRB) with comments: TRANSESOPHAGEAL ECHOCARDIOGRAM (TEE) (N/A) CARDIOVERSION (N/A) as a surgical intervention .  The patient's history has been reviewed, patient examined, no change in status, stable for surgery.  I have reviewed the patient's chart and labs.  Questions were answered to the patient's satisfaction.     [name] [name] [employer]

## 2012-08-28 NOTE — Procedures (Signed)
Electrical Cardioversion Procedure Note Trig Mcbryar [id number] [birth year]  Procedure: Electrical Cardioversion Indications:  Atrial Fibrillation  Procedure Details Consent: Risks of procedure as well as the alternatives and risks of each were explained to the (patient/caregiver).  Consent for procedure obtained. Time Out: Verified patient identification, verified procedure, site/side was marked, verified correct patient position, special equipment/implants available, medications/allergies/relevent history reviewed, required imaging and test results available.  Performed  Patient placed on cardiac monitor, pulse oximetry, supplemental oxygen as necessary.  Sedation given: Propofol IV by anesthesiology. Pacer pads placed anterior and posterior chest.  Cardioverted 1 time(s).  Cardioverted at 200J.  Evaluation Findings: Post procedure EKG shows: NSR Complications: None Patient did tolerate procedure well.   [name] 08/28/2012, 1:27 PM

## 2012-08-28 NOTE — Progress Notes (Signed)
Pt returned from cardioversion and TEE.  VSS .  R. Barrett, PA notified pt need diet order instructed that pt can have same diet as before.  Will cont. To monitor.  Amanda Pea, Charity fundraiser.

## 2012-08-28 NOTE — Progress Notes (Signed)
Patient ID: [name] [name], male   DOB: 23-Jan-1930, 78 y.o.   MRN: [id number]    SUBJECTIVE: No dyspnea.  Diuresed well overnight.  Weight decreased 2 lbs.  No chest pain.       [name] amiodarone  200 mg Oral BID  . aspirin EC  81 mg Oral Daily  . atorvastatin  40 mg Oral Daily  . ferrous sulfate  325 mg Oral Q breakfast  . furosemide  40 mg Intravenous Q8H  . lisinopril  2.5 mg Oral Daily  . metoprolol succinate  75 mg Oral BID  . multivitamin with minerals  1 tablet Oral Daily  . potassium chloride  40 mEq Oral Once  . sodium chloride  3 mL Intravenous Q12H  . sodium chloride  3 mL Intravenous Q12H  . Tamsulosin HCl  0.4 mg Oral Daily  . warfarin  4 mg Oral ONCE-1800  . Warfarin - Pharmacist Dosing Inpatient   Does not apply q1800  . DISCONTD: furosemide  40 mg Intravenous Q8H  . DISCONTD: potassium chloride  40 mEq Oral Once      Filed Vitals:   08/27/12 1741 08/27/12 1848 08/27/12 2116 08/28/12 0606  BP: 113/78 106/74 100/61 102/71  Pulse: 105  104 61  Temp: 98.5 F (36.9 C)  98.3 F (36.8 C) 97.5 F (36.4 C)  TempSrc: Oral  Oral Oral  Resp: 20  19 20   Height: 5\' 4"  (1.626 m)     Weight: 163 lb 2.3 oz (74 kg)   161 lb 12.8 oz (73.392 kg)  SpO2:   99% 99%    Intake/Output Summary (Last 24 hours) at 08/28/12 0756 Last data filed at 08/28/12 0700  Gross per 24 hour  Intake    128 ml  Output   1475 ml  Net  -1347 ml    LABS: Basic Metabolic Panel:  Basename 08/28/12 0635 08/27/12 1756  NA 142 137  K 3.7 4.2  CL 106 103  CO2 26 23  GLUCOSE 90 99  BUN 34* 35*  CREATININE 2.11* 2.14*  CALCIUM 9.7 9.9  MG -- --  PHOS -- --   Liver Function Tests:  Basename 08/27/12 1756  AST 26  ALT 16  ALKPHOS 68  BILITOT 0.9  PROT 6.7  ALBUMIN 3.2*   No results found for this basename: LIPASE:2,AMYLASE:2 in the last 72 hours CBC:  Basename 08/28/12 0635 08/27/12 1756  WBC 6.5 7.9  NEUTROABS -- 6.0  HGB 10.4* 10.7*  HCT 31.6* 32.6*  MCV 87.5 88.1  PLT 132*  130*   Cardiac Enzymes: No results found for this basename: CKTOTAL:3,CKMB:3,CKMBINDEX:3,TROPONINI:3 in the last 72 hours BNP: No components found with this basename: POCBNP:3 D-Dimer: No results found for this basename: DDIMER:2 in the last 72 hours Hemoglobin A1C: No results found for this basename: HGBA1C in the last 72 hours Fasting Lipid Panel: No results found for this basename: CHOL,HDL,LDLCALC,TRIG,CHOLHDL,LDLDIRECT in the last 72 hours Thyroid Function Tests:  Basename 08/27/12 1756  TSH 1.583  T4TOTAL --  T3FREE --  THYROIDAB --   Anemia Panel: No results found for this basename: VITAMINB12,FOLATE,FERRITIN,TIBC,IRON,RETICCTPCT in the last 72 hours  RADIOLOGY: No results found.  PHYSICAL EXAM General: NAD Neck: JVP 12 cm, no thyromegaly or thyroid nodule.  Lungs: Clear to auscultation bilaterally with normal respiratory effort. CV: Nondisplaced PMI.  Heart irregular S1/S2, no S3/S4, 3/6 HSM.  1+ ankle edema.  No carotid bruit.   Abdomen: Soft, nontender, no hepatosplenomegaly, no distention.  Neurologic: Alert and  oriented x 3.  Psych: Normal affect. Extremities: No clubbing or cyanosis.   TELEMETRY: Reviewed telemetry pt in atrial fibrillation in the 70s  ASSESSMENT AND PLAN: 78 yo with history of CAD s/p CABG, atrial fibrillation with RVR and acute on chronic systolic CHF.  1. Atrial fibrillation: HR 70s on higher dose of Toprol XL.  - Amiodarone at 200 mg bid until cardioversion, if successful can decrease to daily. LFTs and TSH were normal. - INR is therapeutic today.  Can proceed with TEE-guided cardioversion today without bridging heparin or Lovenox. 2. CHF: Acute on chronic systolic CHF. He is volume overloaded on exam with NYHA class III symptoms. He was recently treated for CHF at an outside hospital but remains very volume overloaded. Creatinine has been elevated. EF 35-40% on echo done in [place]. Possible tachy-mediated cardiomyopathy but I do not  know if he was in atrial fibrillation or not when he was at that hospital initially. He denies chest pain.  Good diuresis so far, creatinine stable. - Continue Lasix at 40 mg IV every 8 hrs.  Still volume overloaded. - Continue Toprol XL at 75 mg bid and current dose of lisinopril (2.5 mg daily).  - Will need Lexiscan myoview at some point, likely as outpatient, to assess for ischemia given worsening clinical picture.  - As above, CHF worsening seems to correspond to onset of atrial fibrillation. Will try to get him out of atrial fibrillation today.  3. CKD: Creatinine has been elevated, will need to follow closely with diuresis. It is stable so far.   [name] 08/28/2012 8:00 AM

## 2012-08-28 NOTE — CV Procedure (Signed)
Procedure: TEE  Indication: atrial fibrillation, pre-DCCV.   Sedation: Versed 3 mg IV, 25 mcg IV  Findings: See echo section of chart for full report.  Normal LV size with moderate systolic dysfunction, EF 35-40% with posterior and inferior severe hypokinesis to akinesis.  Severe mitral regurgitation, suspect ischemic-type MR due to wall motion abnormalities.  Mild to moderate pulmonary hypertension.  The RV appeared mildly dysfunctional.  No LAA thrombus.   No complications.   OK to proceed to cardioversion.   [name] 08/28/2012 1:22 PM

## 2012-08-28 NOTE — Progress Notes (Signed)
  Echocardiogram Echocardiogram Transesophageal has been performed.  [name] [name] 08/28/2012, 3:45 PM

## 2012-08-28 NOTE — Anesthesia Postprocedure Evaluation (Signed)
  Anesthesia Post-op Note  Patient: [name] [name]  Procedure(s) Performed: Procedure(s) (LRB) with comments: TRANSESOPHAGEAL ECHOCARDIOGRAM (TEE) (N/A) CARDIOVERSION (N/A)  Patient Location: Endo  Anesthesia Type: General  Level of Consciousness: awake  Airway and Oxygen Therapy: Patient Spontanous Breathing and Patient connected to nasal cannula oxygen  Post-op Pain: none  Post-op Assessment: Post-op Vital signs reviewed, Patient's Cardiovascular Status Stable, Respiratory Function Stable, Patent Airway and No signs of Nausea or vomiting  Post-op Vital Signs: Reviewed and stable  Complications: No apparent anesthesia complications

## 2012-08-28 NOTE — Clinical Documentation Improvement (Signed)
CKD DOCUMENTATION CLARIFICATION QUERY   THIS DOCUMENT IS NOT A PERMANENT PART OF THE MEDICAL RECORD  TO RESPOND TO THE THIS QUERY, FOLLOW THE INSTRUCTIONS BELOW:  1. If needed, update documentation for the patient's encounter via the notes activity.  2. Access this query again and click edit on the In [employer].  3. After updating, or not, click F2 to complete all highlighted (required) fields concerning your review. Select "additional documentation in the medical record" OR "no additional documentation provided".  4. Click Sign note button.  5. The deficiency will fall out of your In Basket *Please let us know if you are not able to complete this workflow by phone or e-mail (listed below).  Please update your documentation within the medical record to reflect your response to this query.                                                                                        08/28/12   Dear Dr.McLean/Associates,  In a better effort to capture your patient's severity of illness, reflect appropriate length of stay and utilization of resources, a review of the patient medical record has revealed the following indicators.    Based on your clinical judgment, please clarify and document in a progress note and/or discharge summary the clinical condition associated with the following supporting information:  In responding to this query please exercise your independent judgment.  The fact that a query is asked, does not imply that any particular answer is desired or expected.  Possible Clinical Conditions?   _______CKD Stage III - GFR 30-59 _______CKD Stage IV - GFR 15-29 _______CKD Stage V - GFR < 15 _______ESRD (End Stage Renal Disease) _______Other condition _______Cannot Clinically determine    Signs & Symptoms: Patient with a history of CKD per 9/4 progress notes.  Diagnostics:  Lab:  9/4:  Bun:  34.             9/3: Bun:  35 9/4: Creat: 2.11           9/3:Creat: 2.14 9/4:  GFR: 28.              9/3: GFR:  28    You may use possible, probable, or suspect with inpatient documentation. possible, probable, suspected diagnoses MUST be documented at the time of discharge  Reviewed: No additional documentation provided.   Thank You,  Marciano Sequin,  Clinical Documentation Specialist:  Pager: [phone]  Health Information Management [place]

## 2012-08-28 NOTE — Progress Notes (Signed)
ANTICOAGULATION CONSULT NOTE - Initial Consult  Pharmacy Consult for Warfarin Indication: atrial fibrillation  No Known Allergies  Patient Measurements: Height: 5\' 4"  (162.6 cm) Weight: 161 lb 12.8 oz (73.392 kg) (scale a) IBW/kg (Calculated) : 59.2   Vital Signs: Temp: 97.8 F (36.6 C) (09/04 0920) Temp src: Oral (09/04 0920) BP: 115/68 mmHg (09/04 0920) Pulse Rate: 68  (09/04 0920)  Labs:  Basename 08/28/12 0635 08/27/12 1756 08/27/12 1417  HGB 10.4* 10.7* --  HCT 31.6* 32.6* --  PLT 132* 130* --  APTT -- -- --  LABPROT 24.3* -- 22.1*  INR 2.14* -- 1.90*  HEPARINUNFRC -- -- --  CREATININE 2.11* 2.14* --  CKTOTAL -- -- --  CKMB -- -- --  TROPONINI -- -- --    Estimated Creatinine Clearance: 26.1 ml/min (by C-G formula based on Cr of 2.11).   Medical History: Past Medical History  Diagnosis Date  . Coronary artery disease   . MI, old     AGE 54  . Ulcerative colitis   . Hyperlipidemia   . Hypertension   . Acute renal failure   . History of nephrolithiasis   . PAF (paroxysmal atrial fibrillation)   . LV dysfunction   . Myocardial infarction   . Anemia   . Blood transfusion   . Arthritis   . CHF (congestive heart failure)     Medications:  Prescriptions prior to admission  Medication Sig Dispense Refill  . amiodarone (PACERONE) 200 MG tablet Take 200 mg by mouth daily.      [name] atorvastatin (LIPITOR) 40 MG tablet Take 40 mg by mouth daily.      . Cholecalciferol (VITAMIN D-3 PO) Take 1,000 Units by mouth daily.       . ferrous sulfate 325 (65 FE) MG tablet Take 325 mg by mouth daily with breakfast.       . fish oil-omega-3 fatty acids 1000 MG capsule Take 2 g by mouth daily.       . Flaxseed, Linseed, (FLAX SEED OIL PO) Take 1 capsule by mouth daily.       . folic acid (FOLVITE) 400 MCG tablet Take 400 mcg by mouth daily.       . furosemide (LASIX) 40 MG tablet Take 1 tablet (40 mg total) by mouth 2 (two) times daily.      [name] lisinopril (ZESTRIL) 2.5 MG  tablet Take 1 tablet (2.5 mg total) by mouth daily.      . metoprolol succinate (TOPROL-XL) 50 MG 24 hr tablet Take 50 mg by mouth 2 (two) times daily.      . Multiple Vitamin (MULITIVITAMIN WITH MINERALS) TABS Take 1 tablet by mouth daily.      . Multiple Vitamins-Minerals (ICAPS AREDS FORMULA PO) Take 1 capsule by mouth daily.      . potassium chloride SA (K-DUR,KLOR-CON) 20 MEQ tablet Take 10-20 mEq by mouth 2 (two) times daily. Take 1 whole tablet in AM and 1/2 tablet in PM      . Tamsulosin HCl (FLOMAX) 0.4 MG CAPS Take 0.4 mg by mouth daily.       [name] warfarin (COUMADIN) 5 MG tablet Take 2.5-5 mg by mouth daily. Take 1/2 tablet on Tues, Thurs, and Sat. Take 1 tablet all other days       Admit Complaint: 78 y.o.  male  admitted 08/27/2012 with atrial fibrillation and HF exacerbation.  Pharmacy consulted to dose warfarin.  Home warfarin dose: 5 mg daily, except 2.5 mg TTS  Events:  INR now therapeutic, plan for DCCV  Assessment: Anticoagulation: atrial fibrillation; warfarin PTA, INR at goal, no bleeding noted  Cardiovascular: atrial fibrillation, HF (EF 50%), CAD s/p CABG, hyperlipidemia, HTN: furosemide 40 IV q8, amio, ASA, atorva, lisinopril, metoprolol  Gastrointestinal / Nutrition: Ulcerative colitis  Neurology/MSK:  OA  Nephrology/Urology/Electrolytes: CKD: flomax  Hematology / Oncology: history of anemia: PO iron, MVI  PTA Medication Issues: Home Meds Not Ordered: Vitamin D, Fish oil, folic acid, KCL 20 bid  Best Practices: DVT Prophylaxis:  Therapeutic INR   Goal of Therapy:  INR 2-3 Monitor platelets by anticoagulation protocol: Yes   Plan:  1. Warfarin 5 mg today 2. Daily INR   Thank you for allowing pharmacy to be a part of this patients care team.  Lovenia Kim Pharm.D., BCPS Clinical Pharmacist 08/28/2012 11:19 AM Pager: [phone] Phone: [phone]

## 2012-08-29 ENCOUNTER — Encounter (HOSPITAL_COMMUNITY): Payer: Self-pay | Admitting: Cardiology

## 2012-08-29 DIAGNOSIS — I509 Heart failure, unspecified: Secondary | ICD-10-CM

## 2012-08-29 DIAGNOSIS — I5023 Acute on chronic systolic (congestive) heart failure: Secondary | ICD-10-CM | POA: Diagnosis present

## 2012-08-29 LAB — BASIC METABOLIC PANEL
CO2: 24 mEq/L (ref 19–32)
Calcium: 9.9 mg/dL (ref 8.4–10.5)
Creatinine, Ser: 2.11 mg/dL — ABNORMAL HIGH (ref 0.50–1.35)
GFR calc non Af Amer: 28 mL/min — ABNORMAL LOW (ref >90)
Glucose, Bld: 100 mg/dL — ABNORMAL HIGH (ref 70–99)
Sodium: 139 mEq/L (ref 135–145)

## 2012-08-29 LAB — CBC
MCH: 28.5 pg (ref 26.0–34.0)
MCV: 87.7 fL (ref 78.0–100.0)
Platelets: 159 10*3/uL (ref 150–400)
RDW: 14.6 % (ref 11.5–15.5)
WBC: 8.3 10*3/uL (ref 4.0–10.5)

## 2012-08-29 LAB — PROTIME-INR: INR: 2.42 — ABNORMAL HIGH (ref 0.00–1.49)

## 2012-08-29 MED ORDER — WARFARIN SODIUM 2.5 MG PO TABS
2.5000 mg | ORAL_TABLET | ORAL | Status: DC
  Administered 2012-08-29: 2.5 mg via ORAL
  Filled 2012-08-29: qty 1

## 2012-08-29 MED ORDER — POTASSIUM CHLORIDE 20 MEQ PO PACK
40.0000 meq | PACK | Freq: Once | ORAL | Status: DC
  Filled 2012-08-29: qty 2

## 2012-08-29 MED ORDER — SPIRONOLACTONE 12.5 MG HALF TABLET
12.5000 mg | ORAL_TABLET | Freq: Every day | ORAL | Status: DC
  Administered 2012-08-29 – 2012-08-30 (×2): 12.5 mg via ORAL
  Filled 2012-08-29 (×2): qty 1

## 2012-08-29 MED ORDER — AMIODARONE HCL 200 MG PO TABS
200.0000 mg | ORAL_TABLET | Freq: Every day | ORAL | Status: DC
  Administered 2012-08-29 – 2012-08-30 (×2): 200 mg via ORAL
  Filled 2012-08-29 (×2): qty 1

## 2012-08-29 MED ORDER — POTASSIUM CHLORIDE CRYS ER 20 MEQ PO TBCR
40.0000 meq | EXTENDED_RELEASE_TABLET | Freq: Once | ORAL | Status: AC
  Administered 2012-08-29: 40 meq via ORAL
  Filled 2012-08-29: qty 2

## 2012-08-29 MED ORDER — METOPROLOL SUCCINATE ER 50 MG PO TB24
50.0000 mg | ORAL_TABLET | Freq: Two times a day (BID) | ORAL | Status: DC
  Administered 2012-08-29 – 2012-08-30 (×2): 50 mg via ORAL
  Filled 2012-08-29 (×3): qty 1

## 2012-08-29 MED ORDER — POTASSIUM CHLORIDE CRYS ER 20 MEQ PO TBCR: 40.0000 meq | EXTENDED_RELEASE_TABLET | Freq: Two times a day (BID) | ORAL | Status: DC

## 2012-08-29 MED ORDER — WARFARIN SODIUM 5 MG PO TABS
5.0000 mg | ORAL_TABLET | ORAL | Status: DC
  Filled 2012-08-29: qty 1

## 2012-08-29 NOTE — Progress Notes (Signed)
Patient ID: [name] [name], male   DOB: 05-22-1930, 78 y.o.   MRN: [id number]     SUBJECTIVE: No dyspnea.  Diuresed well, weight down 4 lbs.  Remains in NSR today after DCCV.        [name] amiodarone  200 mg Oral Daily  . aspirin EC  81 mg Oral Daily  . atorvastatin  40 mg Oral Daily  . ferrous sulfate  325 mg Oral Q breakfast  . furosemide  40 mg Intravenous Q8H  . lisinopril  2.5 mg Oral Daily  . metoprolol succinate  75 mg Oral BID  . multivitamin with minerals  1 tablet Oral Daily  . potassium chloride  40 mEq Oral Once  . sodium chloride  3 mL Intravenous Q12H  . sodium chloride  3 mL Intravenous Q12H  . Tamsulosin HCl  0.4 mg Oral Daily  . warfarin  5 mg Oral ONCE-1800  . Warfarin - Pharmacist Dosing Inpatient   Does not apply q1800  . DISCONTD: amiodarone  200 mg Oral BID  . DISCONTD: potassium chloride  40 mEq Oral Once      Filed Vitals:   08/28/12 1700 08/28/12 1800 08/28/12 2104 08/29/12 0455  BP: 111/68 105/60 99/68 101/65  Pulse:   67 58  Temp:   98.8 F (37.1 C) 97.5 F (36.4 C)  TempSrc:   Oral Oral  Resp:   16 20  Height:      Weight:    157 lb 14.4 oz (71.623 kg)  SpO2:   100% 100%    Intake/Output Summary (Last 24 hours) at 08/29/12 0737 Last data filed at 08/29/12 0600  Gross per 24 hour  Intake 892.83 ml  Output   1271 ml  Net -378.17 ml    LABS: Basic Metabolic Panel:  Basename 08/28/12 0635 08/27/12 1756  NA 142 137  K 3.7 4.2  CL 106 103  CO2 26 23  GLUCOSE 90 99  BUN 34* 35*  CREATININE 2.11* 2.14*  CALCIUM 9.7 9.9  MG -- --  PHOS -- --   Liver Function Tests:  Basename 08/27/12 1756  AST 26  ALT 16  ALKPHOS 68  BILITOT 0.9  PROT 6.7  ALBUMIN 3.2*   No results found for this basename: LIPASE:2,AMYLASE:2 in the last 72 hours CBC:  Basename 08/28/12 0635 08/27/12 1756  WBC 6.5 7.9  NEUTROABS -- 6.0  HGB 10.4* 10.7*  HCT 31.6* 32.6*  MCV 87.5 88.1  PLT 132* 130*   Cardiac Enzymes: No results found for this basename:  CKTOTAL:3,CKMB:3,CKMBINDEX:3,TROPONINI:3 in the last 72 hours BNP: No components found with this basename: POCBNP:3 D-Dimer: No results found for this basename: DDIMER:2 in the last 72 hours Hemoglobin A1C: No results found for this basename: HGBA1C in the last 72 hours Fasting Lipid Panel: No results found for this basename: CHOL,HDL,LDLCALC,TRIG,CHOLHDL,LDLDIRECT in the last 72 hours Thyroid Function Tests:  Basename 08/27/12 1756  TSH 1.583  T4TOTAL --  T3FREE --  THYROIDAB --   Anemia Panel: No results found for this basename: VITAMINB12,FOLATE,FERRITIN,TIBC,IRON,RETICCTPCT in the last 72 hours  RADIOLOGY: No results found.  PHYSICAL EXAM General: NAD Neck: JVP 10-12 cm, no thyromegaly or thyroid nodule.  Lungs: Clear to auscultation bilaterally with normal respiratory effort. CV: Nondisplaced PMI.  Heart irregular S1/S2, no S3/S4, 3/6 HSM.  No ankle edema.  No carotid bruit.   Abdomen: Soft, nontender, no hepatosplenomegaly, no distention.  Neurologic: Alert and oriented x 3.  Psych: Normal affect. Extremities: No clubbing or cyanosis.  TELEMETRY: Reviewed telemetry pt in atrial fibrillation in the 70s  ASSESSMENT AND PLAN: 78 yo with history of CAD s/p CABG, atrial fibrillation with RVR and acute on chronic systolic CHF.  1. Atrial fibrillation: Now in NSR after cardioversion.  May be difficult to maintain in NSR with severe MR.  Continue amiodarone (decrease to once daily) and Toprol XL.  2. CHF: Acute on chronic systolic CHF.   He is still volume overloaded but improving.   EF 35-40% on echo with inferoposterior WMAs and severe MR, likely ischemic from WMAs.  - Continue Lasix at 40 mg IV every 8 hrs today, likely to po tomorrow. - Continue Toprol XL at 75 mg bid and current dose of lisinopril (2.5 mg daily).  - Will need Lexiscan myoview at some point, likely as outpatient, to assess for ischemia given worsening clinical picture.  - As above, CHF worsening seems to  correspond to onset of atrial fibrillation. Will try to keep him in NSR. 3. CKD: Awaiting creatinine this morning. 4. MR: Severe on TEE, suspect ischemic-type MR from inferoposterior wall motion abnormalities and tethering of the posterior leaflet.   [name] 08/29/2012 7:37 AM

## 2012-08-29 NOTE — Progress Notes (Signed)
ANTICOAGULATION CONSULT NOTE - Follow-Up Consult  Pharmacy Consult for Warfarin Indication: atrial fibrillation  No Known Allergies  Patient Measurements: Height: 5\' 4"  (162.6 cm) Weight: 157 lb 14.4 oz (71.623 kg) (scale a) IBW/kg (Calculated) : 59.2   Vital Signs: Temp: 98.1 F (36.7 C) (09/05 1447) Temp src: Oral (09/05 1447) BP: 97/66 mmHg (09/05 1447) Pulse Rate: 64  (09/05 1447)  Labs:  Basename 08/29/12 1224 08/28/12 0635 08/27/12 1756 08/27/12 1417  HGB 10.9* 10.4* -- --  HCT 33.6* 31.6* 32.6* --  PLT 159 132* 130* --  APTT -- -- -- --  LABPROT 26.7* 24.3* -- 22.1*  INR 2.42* 2.14* -- 1.90*  HEPARINUNFRC -- -- -- --  CREATININE 2.11* 2.11* 2.14* --  CKTOTAL -- -- -- --  CKMB -- -- -- --  TROPONINI -- -- -- --    Estimated Creatinine Clearance: 25.8 ml/min (by C-G formula based on Cr of 2.11).  Admit Complaint: Pt on coumadin for atrial fibrillation. INR remains at goal, no bleeding noted.   Goal of Therapy:  INR 2-3 Monitor platelets by anticoagulation protocol: Yes   Plan:  1. Restart pt's home dose of Coumadin 5 mg daily except 2.5mg  on Tues/Thur/Sat. 2. Daily INR  Thank you for allowing pharmacy to be a part of this patients care team.  Christoper Fabian, PharmD, BCPS Clinical pharmacist, pager [phone] 08/29/2012 3:26 PM

## 2012-08-29 NOTE — Plan of Care (Signed)
Problem: Phase I Progression Outcomes Goal: Dyspnea controlled at rest (HF) Outcome: Completed/Met Date Met:  08/29/12 Pt has had no c/o SOB while at rest or exertion, pt O2 WNL, goal met, will monitor Goal: Pain controlled with appropriate interventions Outcome: Completed/Met Date Met:  08/29/12 Pt has not had any c/o pain with activity or at rest, pt states he feels "good", goal met, will continue to monitor Worthy Flank, RN

## 2012-08-30 DIAGNOSIS — I4891 Unspecified atrial fibrillation: Secondary | ICD-10-CM | POA: Diagnosis present

## 2012-08-30 DIAGNOSIS — N179 Acute kidney failure, unspecified: Secondary | ICD-10-CM | POA: Diagnosis present

## 2012-08-30 LAB — CBC
MCH: 28.3 pg (ref 26.0–34.0)
MCV: 86.6 fL (ref 78.0–100.0)
Platelets: 130 10*3/uL — ABNORMAL LOW (ref 150–400)
RDW: 14.6 % (ref 11.5–15.5)

## 2012-08-30 LAB — BASIC METABOLIC PANEL
Calcium: 9.6 mg/dL (ref 8.4–10.5)
Creatinine, Ser: 2.34 mg/dL — ABNORMAL HIGH (ref 0.50–1.35)
GFR calc Af Amer: 29 mL/min — ABNORMAL LOW (ref >90)
GFR calc non Af Amer: 25 mL/min — ABNORMAL LOW (ref >90)
Sodium: 141 mEq/L (ref 135–145)

## 2012-08-30 LAB — PROTIME-INR: Prothrombin Time: 32.1 seconds — ABNORMAL HIGH (ref 11.6–15.2)

## 2012-08-30 MED ORDER — SPIRONOLACTONE 25 MG PO TABS: 12.5000 mg | ORAL_TABLET | Freq: Every day | ORAL | Status: DC

## 2012-08-30 MED ORDER — ASPIRIN 81 MG PO TABS: 81.0000 mg | ORAL_TABLET | Freq: Every day | ORAL | Status: DC

## 2012-08-30 MED ORDER — FUROSEMIDE 40 MG PO TABS: 40.0000 mg | ORAL_TABLET | Freq: Two times a day (BID) | ORAL | Status: DC

## 2012-08-30 MED ORDER — WARFARIN SODIUM 5 MG PO TABS: 5.0000 mg | ORAL_TABLET | ORAL | Status: DC

## 2012-08-30 MED ORDER — WARFARIN SODIUM 2.5 MG PO TABS
2.5000 mg | ORAL_TABLET | Freq: Once | ORAL | Status: DC
  Filled 2012-08-30: qty 1

## 2012-08-30 MED ORDER — POTASSIUM CHLORIDE CRYS ER 20 MEQ PO TBCR: 20.0000 meq | EXTENDED_RELEASE_TABLET | Freq: Every day | ORAL | Status: DC

## 2012-08-30 NOTE — Discharge Summary (Signed)
CARDIOLOGY DISCHARGE SUMMARY   Patient ID: [name] [name] MRN: [id number] DOB/AGE: 1930/01/28 78 y.o.  Admit date: 08/27/2012 Discharge date: 08/30/2012  Primary Discharge Diagnosis:  Atrial fibrillation, RVR, s/p TEE/DCCV causing acute systolic CHF Secondary Discharge Diagnosis:  Past Medical History  Diagnosis Date    acute on chronic renal failure   September 2013   . Coronary artery disease   . MI, old     AGE 64  . Ulcerative colitis   . Hyperlipidemia   . Hypertension   . Acute renal failure 2011  . History of nephrolithiasis   . PAF (paroxysmal atrial fibrillation)   . LV dysfunction   . Myocardial infarction   . Anemia   . Blood transfusion   . Arthritis   . CHF (congestive heart failure)    Procedures: TEE/DCCV  Hospital Course: [name] [name] is a 78 year old male with a history of atrial fibrillation and heart failure. He has a history of atrial fibrillation. He was hospitalized in July and [place] with heart failure exacerbation and was diuresed. After that hospitalization, he was seen in the office and was in atrial fibrillation with rapid ventricular response. He was started on Coumadin and metoprolol as well as amiodarone. He was again diuresed at the hospital in [place] for 2 days about a week ago and discharged. He came to the hospital for outpatient cardioversion but his INR was subtherapeutic at 1.9. He was admitted for further evaluation and treatment.  He was volume overloaded on exam and was having class III symptoms. His BUN/creatinine were elevated on admission and his EF was decreased on a recent echo done in [place]. 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 It was also felt that the atrial fibrillation was possibly contributing to his heart failure. He was started on Lasix at 40 mg IV every 8 hours and continued on his beta blocker as well as his ACE inhibitor for rate control and blood pressure control.  He diuresed a  total of approximately 2 L during his hospital stay. His respiratory status improved and by discharge his oxygen saturation was 100%. He was scheduled for a TEE cardioversion and this was performed on 08/28/2012. The TEE results are below. The cardioversion was successful. While in the hospital, his amiodarone was increased from 200 mg daily to 200 mg twice a day, but he is to go back on 200 mg daily at discharge. His Coumadin was followed closely and he was therapeutic at discharge. Post-cardioversion, he was maintaining sinus rhythm, but was occasionally in sinus bradycardia with heart rates at times in the 40s. The bradycardia was not sustained and his heart rate was generally in the high 50s or more. No dose change was made on his metoprolol.  On 08/30/2012, [name] [name] was seen by Dr. [name]. He was maintaining sinus rhythm and feeling much better. He had initially been started on spur on the lack tone, but the cause of the worsening renal function, this was discontinued. He is currently to continue on lisinopril 2.5 mg a day and his beta blocker with no dose change. Initially, it was felt his home dose of Lasix should be increased but with his worsening renal function, he is to go home on Lasix 40 mg twice a day with the addition of potassium 20 mEq. Dr. [name] evaluated [name] [name] on 08/30/2012 and considered him stable for discharge, to followup next week as an outpatient.  Labs:   Lab Results  Component Value Date  WBC 6.8 08/30/2012   HGB 10.4* 08/30/2012   HCT 31.8* 08/30/2012   MCV 86.6 08/30/2012   PLT 130* 08/30/2012    Lab 08/30/12 0930 08/27/12 1756  NA 141 --  K 3.6 --  CL 104 --  CO2 25 --  BUN 36* --  CREATININE 2.34* --  CALCIUM 9.6 --  PROT -- 6.7  BILITOT -- 0.9  ALKPHOS -- 68  ALT -- 16  AST -- 26  GLUCOSE 111* --   Pro B Natriuretic peptide (BNP)  Date/Time Value Range Status  08/27/2012  6:08 PM 28576.0* 0 - 450 pg/mL Final  08/08/2012  9:11 AM 1066.0* 0.0 - 100.0 pg/mL  Final    Basename 08/30/12 0930  INR 3.06*      Radiology: No results found.  EKG: 28-Aug-2012 14:28:54 [facility] ROUT[facility] RECORD Sinus rhythm with 1st degree A-V block New since previous tracing Since last tracing rate slower Right bundle branch block Abnormal ECG 7mm/s 13mm/mV 100Hz  8.0.1 12SL 241 HD CID: 0 Referred by: D MCLEAN Confirmed By: Nanetta Batty MD Vent. rate 66 BPM PR interval 230 ms QRS duration 166 ms QT/QTc 518/543 ms P-R-T axes 88 87 -16  Echo:08/28/2012 TEE Normal LV size with moderate systolic dysfunction, EF 35-40% with posterior and inferior severe hypokinesis to akinesis. Severe mitral regurgitation, suspect ischemic-type MR due to wall motion abnormalities. Mild to moderate pulmonary hypertension. The RV appeared mildly dysfunctional. No LAA thrombus.   FOLLOW UP PLANS AND APPOINTMENTS No Known Allergies Medication List  As of 08/30/2012  2:13 PM   TAKE these medications         amiodarone 200 MG tablet   Commonly known as: PACERONE   Take 200 mg by mouth daily.      aspirin 81 MG tablet   Take 1 tablet (81 mg total) by mouth daily.      atorvastatin 40 MG tablet   Commonly known as: LIPITOR   Take 40 mg by mouth daily.      ferrous sulfate 325 (65 FE) MG tablet   Take 325 mg by mouth daily with breakfast.      fish oil-omega-3 fatty acids 1000 MG capsule   Take 2 g by mouth daily.      FLAX SEED OIL PO   Take 1 capsule by mouth daily.      folic acid 400 MCG tablet   Commonly known as: FOLVITE   Take 400 mcg by mouth daily.      furosemide 40 MG tablet   Commonly known as: LASIX   Take 1 tablet (40 mg total) by mouth 2 (two) times daily. HOLD today 9/6, restart in am on 9/7      ICAPS AREDS FORMULA PO   Take 1 capsule by mouth daily.      metoprolol succinate 50 MG 24 hr tablet   Commonly known as: TOPROL-XL   Take 50 mg by mouth 2 (two) times daily.      multivitamin with minerals Tabs   Take 1 tablet by  mouth daily.      potassium chloride SA 20 MEQ tablet   Commonly known as: K-DUR,KLOR-CON   Take 1 tablet (20 mEq total) by mouth daily. Take 1 whole tablet in AM and 1/2 tablet in PM      Tamsulosin HCl 0.4 MG Caps   Commonly known as: FLOMAX   Take 0.4 mg by mouth daily.      VITAMIN D-3 PO  Take 1,000 Units by mouth daily.      warfarin 5 MG tablet   Commonly known as: COUMADIN   Take 2.5-5 mg by mouth daily. Take 1/2 tablet on Tues, Thurs, and Sat. Take 1 tablet all other days      ZESTRIL 2.5 MG tablet   Generic drug: lisinopril   Take 1 tablet (2.5 mg total) by mouth daily.            Discharge Orders    Future Appointments: Provider: Department: Dept Phone: Center:   09/03/2012 7:15 AM [facility] [phone] None   09/03/2012 12:10 PM [facility] [facility] [phone] [facility]   09/04/2012 9:45 AM [name], MD [facility] [phone] [facility]     Follow-up Information    Follow up with [name], MD. (September 11th at 9:30 am for coumadin check and MD appointment)    Contact information:   [address] [employer] [address] [place] [address] [phone]       Follow up with [facility] [address] (September 10th at 7:00 am for 7:15 am stress test and lab work.)    Solicitor information:   [address] [employer] [phone]         BRING ALL MEDICATIONS WITH YOU TO FOLLOW UP APPOINTMENTS  Time spent with patient to include physician time: 44 min Signed: Theodore Demark 08/30/2012, 2:13 PM Co-Sign MD

## 2012-08-30 NOTE — Progress Notes (Signed)
ANTICOAGULATION CONSULT NOTE - Follow-Up Consult  Pharmacy Consult for Warfarin Indication: atrial fibrillation  No Known Allergies  Patient Measurements: Height: 5\' 4"  (162.6 cm) Weight: 159 lb 1.6 oz (72.167 kg) (a scale) IBW/kg (Calculated) : 59.2   Vital Signs: Temp: 97.3 F (36.3 C) (09/06 0538) Temp src: Oral (09/06 0538) BP: 97/66 mmHg (09/06 0955) Pulse Rate: 73  (09/06 0955)  Labs:  Basename 08/30/12 0930 08/29/12 1224 08/28/12 0635 08/27/12 1756  HGB 10.4* 10.9* -- --  HCT 31.8* 33.6* 31.6* --  PLT 130* 159 132* --  APTT -- -- -- --  LABPROT 32.1* 26.7* 24.3* --  INR 3.06* 2.42* 2.14* --  HEPARINUNFRC -- -- -- --  CREATININE -- 2.11* 2.11* 2.14*  CKTOTAL -- -- -- --  CKMB -- -- -- --  TROPONINI -- -- -- --    Estimated Creatinine Clearance: 25.9 ml/min (by C-G formula based on Cr of 2.11).  Admit Complaint: Pt on coumadin for atrial fibrillation. INR slightly supratherapeutic today on home dose of coumadin. ?Due to increased dose of amiodarone past few days - now back to home dose. no bleeding noted. Noted pt may be d/c home today.   Goal of Therapy:  INR 2-3 Monitor platelets by anticoagulation protocol: Yes   Plan:  1. Give coumadin 2.5mg  po tonight, then plan to restart home dose of 5 mg daily except for 2.5mg  on Tues/Thur/Sat. 2. Daily INR  Thank you for allowing pharmacy to be a part of this patients care team.  Christoper Fabian, PharmD, BCPS Clinical pharmacist, pager [phone] 08/30/2012 10:19 AM

## 2012-08-30 NOTE — Progress Notes (Signed)
Informed that pts. HR dropped to 40s non-sustained. HR back up to 56. Pt. Lying in bed. Denies pain. No signs or symptoms of distress noted. VS stable. Will continue to monitor. MD paged.

## 2012-08-30 NOTE — Progress Notes (Addendum)
Patient ID: [name] [name], male   DOB: 02-Nov-1930, 79 y.o.   MRN: [id number]    SUBJECTIVE: Walked halls yesterday without dyspnea.  Remains in NSR today after DCCV, feels overall better.        [name] amiodarone  200 mg Oral Daily  . aspirin EC  81 mg Oral Daily  . atorvastatin  40 mg Oral Daily  . ferrous sulfate  325 mg Oral Q breakfast  . furosemide  40 mg Intravenous Q8H  . lisinopril  2.5 mg Oral Daily  . metoprolol succinate  50 mg Oral BID  . multivitamin with minerals  1 tablet Oral Daily  . potassium chloride  40 mEq Oral Once  . sodium chloride  3 mL Intravenous Q12H  . sodium chloride  3 mL Intravenous Q12H  . spironolactone  12.5 mg Oral Daily  . Tamsulosin HCl  0.4 mg Oral Daily  . warfarin  2.5 mg Oral Q T,Th,Sat-1800  . warfarin  5 mg Oral Q M,W,F,Su-1800  . Warfarin - Pharmacist Dosing Inpatient   Does not apply q1800  . DISCONTD: metoprolol succinate  75 mg Oral BID  . DISCONTD: potassium chloride  40 mEq Oral Once  . DISCONTD: potassium chloride  40 mEq Oral BID      Filed Vitals:   08/29/12 1447 08/29/12 2117 08/30/12 0516 08/30/12 0538  BP: 97/66 103/65  97/66  Pulse: 64 63  56  Temp: 98.1 F (36.7 C) 98.5 F (36.9 C)  97.3 F (36.3 C)  TempSrc: Oral Oral  Oral  Resp: 18 18  18   Height:      Weight:   159 lb 1.6 oz (72.167 kg)   SpO2: 99% 100%  99%    Intake/Output Summary (Last 24 hours) at 08/30/12 0728 Last data filed at 08/30/12 0300  Gross per 24 hour  Intake    730 ml  Output    900 ml  Net   -170 ml    LABS: Basic Metabolic Panel:  Basename 08/29/12 1224 08/28/12 0635  NA 139 142  K 3.2* 3.7  CL 101 106  CO2 24 26  GLUCOSE 100* 90  BUN 33* 34*  CREATININE 2.11* 2.11*  CALCIUM 9.9 9.7  MG -- --  PHOS -- --   Liver Function Tests:  Basename 08/27/12 1756  AST 26  ALT 16  ALKPHOS 68  BILITOT 0.9  PROT 6.7  ALBUMIN 3.2*   No results found for this basename: LIPASE:2,AMYLASE:2 in the last 72 hours CBC:  Basename  08/29/12 1224 08/28/12 0635 08/27/12 1756  WBC 8.3 6.5 --  NEUTROABS -- -- 6.0  HGB 10.9* 10.4* --  HCT 33.6* 31.6* --  MCV 87.7 87.5 --  PLT 159 132* --   Cardiac Enzymes: No results found for this basename: CKTOTAL:3,CKMB:3,CKMBINDEX:3,TROPONINI:3 in the last 72 hours BNP: No components found with this basename: POCBNP:3 D-Dimer: No results found for this basename: DDIMER:2 in the last 72 hours Hemoglobin A1C: No results found for this basename: HGBA1C in the last 72 hours Fasting Lipid Panel: No results found for this basename: CHOL,HDL,LDLCALC,TRIG,CHOLHDL,LDLDIRECT in the last 72 hours Thyroid Function Tests:  Basename 08/27/12 1756  TSH 1.583  T4TOTAL --  T3FREE --  THYROIDAB --   Anemia Panel: No results found for this basename: VITAMINB12,FOLATE,FERRITIN,TIBC,IRON,RETICCTPCT in the last 72 hours  RADIOLOGY: No results found.  PHYSICAL EXAM General: NAD Neck: JVP 10 cm, no thyromegaly or thyroid nodule.  Lungs: Clear to auscultation bilaterally with normal respiratory effort.  CV: Nondisplaced PMI.  Heart irregular S1/S2, no S3/S4, 3/6 HSM.  No ankle edema.  No carotid bruit.   Abdomen: Soft, nontender, no hepatosplenomegaly, no distention.  Neurologic: Alert and oriented x 3.  Psych: Normal affect. Extremities: No clubbing or cyanosis.   TELEMETRY: Reviewed telemetry pt in NSR  ASSESSMENT AND PLAN: 78 yo with history of CAD s/p CABG, atrial fibrillation with RVR and acute on chronic systolic CHF.  1. Atrial fibrillation: Now in NSR after cardioversion.  May be difficult to maintain in NSR with severe MR.  Continue amiodarone (decrease to once daily) and Toprol XL, decreased Toprol back to 50 mg bid with low BP.  2. CHF: Acute on chronic systolic CHF.   He is still volume overloaded but improving.   EF 35-40% on echo with inferoposterior WMAs and severe MR, likely ischemic from WMAs.  - Change Lasix to po (60 mg po bid for home).  - Continue Toprol XL at 50 mg  bid and current dose of lisinopril (2.5 mg daily).  - Will need Lexiscan myoview as outpatient to assess for ischemia given worsening clinical picture.  - As above, CHF worsening seems to correspond to onset of atrial fibrillation. Will try to keep him in NSR. 3. CKD: Awaiting creatinine this morning, has been stable. 4. MR: Severe on TEE, suspect ischemic-type MR from inferoposterior wall motion abnormalities and tethering of the posterior leaflet.  5. Disposition: May go home today if BMET is stable (still waiting for it).  Needs BMET in 5 days then followup in the office with me in about a week (some time next week).  Needs Steffanie Dunn as outpatient.  Cardiac meds: warfarin, Toprol XL 50 mg bid, lisinopril 2.5 mg daily, spironolactone 12.5 mg daily, amiodarone 200 mg daily, Lasix 60 mg po bid until next appointment (may be adjusted based on renal function).   [name] 08/30/2012 7:28 AM  Creatinine is up to 2.3.  No more Lasix today.  Tomorrow, start Lasix 40 mg po bid + KCl 20 daily.  Stop spironolactone.  Can continue low dose lisinopril for now.  BMET on Tuesday, see me next week after the BMET (Wed/Thurs/or Fri).   [name] 08/30/2012 10:46 AM

## 2012-09-03 ENCOUNTER — Ambulatory Visit (HOSPITAL_COMMUNITY): Payer: Medicare Other | Attending: Cardiovascular Disease | Admitting: Radiology

## 2012-09-03 ENCOUNTER — Other Ambulatory Visit (INDEPENDENT_AMBULATORY_CARE_PROVIDER_SITE_OTHER): Payer: Medicare Other

## 2012-09-03 VITALS — BP 105/72 | HR 60 | Ht 64.0 in | Wt 156.0 lb

## 2012-09-03 DIAGNOSIS — R002 Palpitations: Secondary | ICD-10-CM | POA: Insufficient documentation

## 2012-09-03 DIAGNOSIS — I509 Heart failure, unspecified: Secondary | ICD-10-CM | POA: Insufficient documentation

## 2012-09-03 DIAGNOSIS — I4510 Unspecified right bundle-branch block: Secondary | ICD-10-CM | POA: Insufficient documentation

## 2012-09-03 DIAGNOSIS — I4891 Unspecified atrial fibrillation: Secondary | ICD-10-CM | POA: Insufficient documentation

## 2012-09-03 DIAGNOSIS — I10 Essential (primary) hypertension: Secondary | ICD-10-CM | POA: Insufficient documentation

## 2012-09-03 DIAGNOSIS — I25810 Atherosclerosis of coronary artery bypass graft(s) without angina pectoris: Secondary | ICD-10-CM

## 2012-09-03 DIAGNOSIS — I5032 Chronic diastolic (congestive) heart failure: Secondary | ICD-10-CM

## 2012-09-03 DIAGNOSIS — I2510 Atherosclerotic heart disease of native coronary artery without angina pectoris: Secondary | ICD-10-CM

## 2012-09-03 DIAGNOSIS — I5022 Chronic systolic (congestive) heart failure: Secondary | ICD-10-CM

## 2012-09-03 DIAGNOSIS — R0989 Other specified symptoms and signs involving the circulatory and respiratory systems: Secondary | ICD-10-CM | POA: Insufficient documentation

## 2012-09-03 DIAGNOSIS — Z87891 Personal history of nicotine dependence: Secondary | ICD-10-CM | POA: Insufficient documentation

## 2012-09-03 DIAGNOSIS — R0602 Shortness of breath: Secondary | ICD-10-CM

## 2012-09-03 DIAGNOSIS — R0609 Other forms of dyspnea: Secondary | ICD-10-CM | POA: Insufficient documentation

## 2012-09-03 LAB — BASIC METABOLIC PANEL
Calcium: 9.4 mg/dL (ref 8.4–10.5)
GFR: 27.98 mL/min — ABNORMAL LOW (ref >60.00)
Glucose, Bld: 73 mg/dL (ref 70–99)
Potassium: 3.5 mEq/L (ref 3.5–5.1)
Sodium: 139 mEq/L (ref 135–145)

## 2012-09-03 MED ORDER — TECHNETIUM TC 99M SESTAMIBI GENERIC - CARDIOLITE
33.0000 | Freq: Once | INTRAVENOUS | Status: AC | PRN
  Administered 2012-09-03: 33 via INTRAVENOUS

## 2012-09-03 MED ORDER — REGADENOSON 0.4 MG/5ML IV SOLN
0.4000 mg | Freq: Once | INTRAVENOUS | Status: AC
  Administered 2012-09-03: 0.4 mg via INTRAVENOUS

## 2012-09-03 MED ORDER — TECHNETIUM TC 99M SESTAMIBI GENERIC - CARDIOLITE
11.0000 | Freq: Once | INTRAVENOUS | Status: AC | PRN
  Administered 2012-09-03: 11 via INTRAVENOUS

## 2012-09-03 NOTE — Progress Notes (Signed)
[facility] SITE 3 NUCLEAR MED [address] [place] [address] [phone]  Cardiology Nuclear Med Study  [name] [name] is a 78 y.o. male     MRN : [id number]     DOB: 06-Mar-1930  Procedure Date: 09/03/2012  Nuclear Med Background Indication for Stress Test:  Evaluation for Ischemia and Graft Patency History:  h/o MI 42 yrs ago; '11 HQI:[id number] infarct, no ishemia, EF=33%>CABG; 7/13 Echo:EF=35-40% with severe MR; 08/27/12 Cardioversion for afib with RVR, 7/13 [place] ED with CHF Cardiac Risk Factors: Family History - CAD, History of Smoking, Hypertension, Lipids, PVD and RBBB  Symptoms:  DOE, Fatigue and Palpitations   Nuclear Pre-Procedure Caffeine/Decaff Intake:  None NPO After: 6:00pm   Lungs:  Clear. O2 Sat: 99% on room air. IV 0.9% NS with Angio Cath:  20g  IV Site: R Hand  IV Started by:  Doyne Keel, CNMT  Chest Size (in):  40 Cup Size: n/a  Height: 5\' 4"  (1.626 m)  Weight:  156 lb (70.761 kg)  BMI:  Body mass index is 26.78 kg/(m^2). Tech Comments:  Toprol taken as directed.    Nuclear Med Study 1 or 2 day study: 1 day  Stress Test Type:  Treadmill/Lexiscan  Reading MD: [name] Haws, MD  Order Authorizing Provider:  [name], MD  Resting Radionuclide: Technetium 69m Sestamibi  Resting Radionuclide Dose: 11.0 mCi   Stress Radionuclide:  Technetium 64m Sestamibi  Stress Radionuclide Dose: 33.0 mCi           Stress Protocol Rest HR: 60 Stress HR: 74  Rest BP: 105/72 Stress BP: 116/76  Exercise Time (min): 2:00 METS: n/a   Predicted Max HR: 141 bpm % Max HR: 52.48 bpm Rate Pressure Product: 8584   Dose of Adenosine (mg):  n/a Dose of Lexiscan: 0.4 mg  Dose of Atropine (mg): n/a Dose of Dobutamine: n/a mcg/kg/min (at max HR)  Stress Test Technologist: Smiley Houseman, CMA-N  Nuclear Technologist:  Domenic Polite, CNMT     Rest Procedure:  Myocardial perfusion imaging was performed at rest 45 minutes following the intravenous  administration of Technetium 25m Sestamibi.  Rest ECG: RBBB with 1st degree AVB.  Stress Procedure:  The patient received IV Lexiscan 0.4 mg over 15-seconds with concurrent low level exercise and then Technetium 91m Sestamibi was injected at 30-seconds while the patient continued walking one more minute. There were no significant changes with Lexiscan, occasional PVC's/PAC's and a short burst of PAT were noted. Quantitative spect images were obtained after a 45-minute delay.  Stress ECG: No significant change from baseline ECG  QPS Raw Data Images:  Normal; no motion artifact; normal heart/lung ratio. Stress Images:  There is decreased uptake in the inferior wall. Rest Images:  There is decreased uptake in the inferior wall. Subtraction (SDS):  There is a fixed defect that is most consistent with a previous infarction. Transient Ischemic Dilatation (Normal <1.22):  1.03 Lung/Heart Ratio (Normal <0.45):  0.41  Quantitative Gated Spect Images QGS EDV:  237 ml QGS ESV:  56 ml  Impression Exercise Capacity:  Lexiscan with low level exercise. BP Response:  Normal blood pressure response. Clinical Symptoms:  No significant symptoms noted. ECG Impression:  No significant ST segment change suggestive of ischemia. Comparison with Prior Nuclear Study: No images to compare  Overall Impression:  Low risk stress nuclear study.  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  LV Wall Motion:  Diffuse hypokinesis  worse in the inferior wall   [name] [name]

## 2012-09-04 ENCOUNTER — Ambulatory Visit (INDEPENDENT_AMBULATORY_CARE_PROVIDER_SITE_OTHER): Payer: Medicare Other | Admitting: Cardiology

## 2012-09-04 ENCOUNTER — Encounter: Payer: Self-pay | Admitting: Cardiology

## 2012-09-04 ENCOUNTER — Ambulatory Visit (INDEPENDENT_AMBULATORY_CARE_PROVIDER_SITE_OTHER): Payer: Medicare Other | Admitting: Pharmacist

## 2012-09-04 VITALS — BP 110/72 | HR 53 | Ht 64.0 in | Wt 159.0 lb

## 2012-09-04 DIAGNOSIS — I25810 Atherosclerosis of coronary artery bypass graft(s) without angina pectoris: Secondary | ICD-10-CM

## 2012-09-04 DIAGNOSIS — I5022 Chronic systolic (congestive) heart failure: Secondary | ICD-10-CM

## 2012-09-04 DIAGNOSIS — I4891 Unspecified atrial fibrillation: Secondary | ICD-10-CM

## 2012-09-04 DIAGNOSIS — I340 Nonrheumatic mitral (valve) insufficiency: Secondary | ICD-10-CM

## 2012-09-04 DIAGNOSIS — E785 Hyperlipidemia, unspecified: Secondary | ICD-10-CM

## 2012-09-04 DIAGNOSIS — N189 Chronic kidney disease, unspecified: Secondary | ICD-10-CM

## 2012-09-04 DIAGNOSIS — I509 Heart failure, unspecified: Secondary | ICD-10-CM

## 2012-09-04 DIAGNOSIS — I059 Rheumatic mitral valve disease, unspecified: Secondary | ICD-10-CM

## 2012-09-04 LAB — POCT INR: INR: 2.9

## 2012-09-04 MED ORDER — METOPROLOL SUCCINATE ER 50 MG PO TB24: 50.0000 mg | ORAL_TABLET | Freq: Two times a day (BID) | ORAL | Status: DC

## 2012-09-04 NOTE — Progress Notes (Signed)
Patient ID: [name] [name], male   DOB: Nov 04, 1930, 78 y.o.   MRN: [id number] PCP: Dr. Linford Arnold  78 yo with history of CAD s/p CABG, ulcerative colitis s/p colectomy, and CHF presents for cardiology followup.  He had CABG in 10/11 complicated by post-operative atrial fibrillation.  EF was 50% on most recent echo in our system in 1/12.  He was started on amiodarone while in the hospital after CABG and this was continued due to risk of further atrial fibrillation in the setting of significantly enlarged left atrium.  He was admitted to the hospital in [place] in 7/12 with a diastolic CHF exacerbation and was diuresed.  I had him stop amiodarone in 2012 given no atrial fibrillation recurrence.    In 7/13, he began to note increased exertional dyspnea.  He went to the hospital in [place] in 7/13 with a diastolic CHF exacerbation.  I am not sure if he was in atrial fibrillation at that time as I have no records of that hospitalization.  It sounds like he was only in the hospital for 2-3 days.  He had an echo done there with EF 35-40%, severe MR, PA systolic pressure 50-60 mmHg.   At a prior appointment, Mr Kresse was in atrial fibrillation with RVR.  I started him on coumadin (NOACs not covered by his insurance) and Toprol XL.  I sent him for TEE-guided cardioversion on amiodarone (INR did not stay therapeutic throught the pre-DCCV period so needed TEE).  He converted to NSR and remains in NSR today.  I did admit him after cardioversion in 9/13 for diuresis.  TEE prior to DCCV confirmed EF 35-40% with severe, post-infarction MR in the setting of severe hypokinesis to akinesis of the inferior and posterior walls.   Since discharge, he has been feeling better.  Weight is down 4 lbs compared to prior appointment.  He has been walking 1-1.25 miles/day and is breathing better.  No dyspnea on flat ground.  No chest pain. No orthopnea.  Last creatinine was elevated at 2.4.   Labs (9/12): LDL 81, HDL 54, K  4.2, creatinine 1.9, BNP 1065 Labs (11/12): K 4.1, creatinine 1.7, BNP 837 Labs (1/13): K 3.7, creatinine 1.6, HDL 52, LDL 100 Labs (5/13): K 4.4, creatinine 1.5, HDL 47, LDL 76 Labs (7/13): creatinine 2.0 Labs (8/13): K 3.9, creatinine 1.9, BNP 1066 Labs (9/13): K 3.5, creatinine 2.4  ECG; NSR, RBBB, PAC, 1st degree AVB 234 msec  PMH: 1. Paroxysmal atrial fibrillation: Noted only post-op CABG in 10/11.  Was not put on coumadin.  Initially on amiodarone but this was stopped. Back in atrial fibrillation in 8/13, amiodarone restarted along with coumadin.  TEE-guided DCCV in 9/13.   2. Knee osteoarthritis 3. Anemia of uncertain etiology: had erythropoietin shots for a period of time.  4. CAD: MI at age 41.  Had CABG in 10/11 with LIMA-LAD, SVG-D, and SVG-RCA.  Lexiscan myoview (9/13) with EF 34%, large inferior infarct from base to apex with no ischemia.   5. Systolic CHF: Echo (1/12) with EF 50%, inferior and inferoseptal hypokinesis, mild LV hypertrophy, moderate MR, severe left atrial enlargement.  Echo [facility], 7/13): EF 35-40%, severe MR, moderate-severe LAE, PASP 50-60 mmHg.  TEE (9/13): EF 35-40%, posterior and inferior severe HK to AK, severe post-infarction MR, mildly decreased RV systolic function.  6. Ulcerative colitis: s/p total colectomy with ileostomy.  7. HTN 8. Hyperlipidemia 9. Nephrolithiasis 10. CKD: H/o ARF in the setting of dehydration.  11. MR: Severe  on 7/13 echo and 9/13 TEE.  Suspect post-infarction MR in the setting of inferoposterior wall motion abnormality.   SH: Retired from [employer].  Married, lives in [place]. Stopped smoking in 1970.    FH: Strong history of CAD.  Father with MI at 54.  All siblings (11) have now passed away, many with heart disease.   ROS: All systems reviewed and negative except as per HPI.   Current Outpatient Prescriptions  Medication Sig Dispense Refill  . amiodarone (PACERONE) 200 MG tablet Take 200 mg by mouth  daily.      [name] aspirin 81 MG tablet Take 1 tablet (81 mg total) by mouth daily.  30 tablet    . atorvastatin (LIPITOR) 40 MG tablet Take 40 mg by mouth daily.      . Cholecalciferol (VITAMIN D-3 PO) Take 1,000 Units by mouth daily.       . ferrous sulfate 325 (65 FE) MG tablet Take 325 mg by mouth daily with breakfast.       . fish oil-omega-3 fatty acids 1000 MG capsule Take 2 g by mouth daily.       . Flaxseed, Linseed, (FLAX SEED OIL PO) Take 1 capsule by mouth daily.       . folic acid (FOLVITE) 400 MCG tablet Take 400 mcg by mouth daily.       . furosemide (LASIX) 40 MG tablet Take 1 tablet (40 mg total) by mouth 2 (two) times daily. HOLD today 9/6, restart in am on 9/7  60 tablet  11  . lisinopril (ZESTRIL) 2.5 MG tablet Take 1 tablet (2.5 mg total) by mouth daily.      . metoprolol succinate (TOPROL-XL) 50 MG 24 hr tablet Take 1 tablet (50 mg total) by mouth 2 (two) times daily.  180 tablet  3  . Multiple Vitamin (MULITIVITAMIN WITH MINERALS) TABS Take 1 tablet by mouth daily.      . Multiple Vitamins-Minerals (ICAPS AREDS FORMULA PO) Take 1 capsule by mouth daily.      . potassium chloride SA (K-DUR,KLOR-CON) 20 MEQ tablet Take 1 tablet (20 mEq total) by mouth daily. Take 1 whole tablet in AM and 1/2 tablet in PM  30 tablet  11  . spironolactone (ALDACTONE) 25 MG tablet 1/2 tablet (total 12.5mg ) daily      . Tamsulosin HCl (FLOMAX) 0.4 MG CAPS Take 0.4 mg by mouth daily.       [name] warfarin (COUMADIN) 5 MG tablet Take 2.5-5 mg by mouth daily. Take 1/2 tablet on Tues, Thurs, and Sat. Take 1 tablet all other days      . DISCONTD: metoprolol succinate (TOPROL-XL) 50 MG 24 hr tablet Take 50 mg by mouth 2 (two) times daily.      [name] DISCONTD: pantoprazole (PROTONIX) 40 MG tablet Take 40 mg by mouth daily.          BP 110/72  Pulse 53  Ht 5\' 4"  (1.626 m)  Wt 159 lb (72.122 kg)  BMI 27.29 kg/m2 General: NAD Neck: JVP 8-9 cm, no thyromegaly or thyroid nodule.  Lungs: Clear to auscultation  bilaterally with normal respiratory effort. CV: Nondisplaced PMI.  Heart irregular S1/S2, no S3/S4, 2/6 HSM at apex. 1+ ankle edema bilaterally.  No carotid bruit.  Abdomen: Soft, nontender, no hepatosplenomegaly, no distention.  Neurologic: Alert and oriented x 3.  Psych: Normal affect. Extremities: No clubbing or cyanosis.   Assessment/Plan  1. Atrial fibrillation  Patient has been cardioverted to NSR and remains in  NSR today on amiodarone.   It is possible that his cardiomyopathy is partly tachycardia-mediated.  - Continue coumadin, Toprol XL, amiodarone.  - Check TSH, LFTs on amiodarone and will need yearly eye exams.  2. Chronic systolic CHF (congestive heart failure)  EF 35-40% on recent TEE.  Cannot rule out component of tachy-mediated cardiomyopathy but suspect ischemic cardiomyopathy plays major role. He denies chest pain. He is volume overloaded but improved compared to last appointment.  NYHA class IIIa symptoms.  - Continue current doses of Toprol XL and lisinopril.  - Start spironolactone 12.5 mg daily with BMET in 2 wks.  - Continue current dose of Lasix for ongoing gentle diuresis.   - I will repeat echo in a couple of months.  3. HYPERLIPIDEMIA  LDL near goal when recently checked (goal < 70).  4. Mitral regurgitation  Severe on recent TEE.  This appeared to be post-infarction MR with inferoposterior severe hypokinesis to akinesis.  I will see what MR looks like on followup echo with (hopefully) maintenance of NSR and medical treatment of CHF.  He would not be a good candidate for MVR given prior sternotomy and significant CKD.   5. CAD  Status post CABG.  Lexiscan myoview showed dense inferior scar with no ischemia.  Continue medical management.  He is on ASA 81, statin, Toprol XL, ACEI.    6. CKD (chronic kidney disease) Will need to follow creatinine carefully with diuresis. Repeat BMET/BNP in 10 days.   Followup with me in 2 wks.   [name] 09/04/2012

## 2012-09-04 NOTE — Patient Instructions (Addendum)
Start spironolactone 12.5mg  daily. This will be one-half 25mg  tablet daily.  Your physician recommends that you return for lab work in: 10 days--BMET/BNP/TSH/Liver profile  Your physician recommends that you schedule a follow-up appointment in: 2 weeks with Dr [name]  Your physician has requested that you have an echocardiogram. Echocardiography is a painless test that uses sound waves to create images of your heart. It provides your doctor with information about the size and shape of your heart and how well your heart's chambers and valves are working. This procedure takes approximately one hour. There are no restrictions for this procedure. In 2 MONTHS

## 2012-09-06 ENCOUNTER — Other Ambulatory Visit: Payer: Self-pay | Admitting: Cardiology

## 2012-09-13 ENCOUNTER — Ambulatory Visit (INDEPENDENT_AMBULATORY_CARE_PROVIDER_SITE_OTHER): Payer: Medicare Other | Admitting: *Deleted

## 2012-09-13 ENCOUNTER — Other Ambulatory Visit (INDEPENDENT_AMBULATORY_CARE_PROVIDER_SITE_OTHER): Payer: Medicare Other

## 2012-09-13 DIAGNOSIS — I25810 Atherosclerosis of coronary artery bypass graft(s) without angina pectoris: Secondary | ICD-10-CM

## 2012-09-13 DIAGNOSIS — E785 Hyperlipidemia, unspecified: Secondary | ICD-10-CM

## 2012-09-13 DIAGNOSIS — I5022 Chronic systolic (congestive) heart failure: Secondary | ICD-10-CM

## 2012-09-13 DIAGNOSIS — I4891 Unspecified atrial fibrillation: Secondary | ICD-10-CM

## 2012-09-13 LAB — BRAIN NATRIURETIC PEPTIDE: Pro B Natriuretic peptide (BNP): 971 pg/mL — ABNORMAL HIGH (ref 0.0–100.0)

## 2012-09-13 LAB — TSH: TSH: 1.24 u[IU]/mL (ref 0.35–5.50)

## 2012-09-13 LAB — BASIC METABOLIC PANEL
BUN: 35 mg/dL — ABNORMAL HIGH (ref 6–23)
Calcium: 10 mg/dL (ref 8.4–10.5)
Creatinine, Ser: 2.3 mg/dL — ABNORMAL HIGH (ref 0.4–1.5)
GFR: 28.95 mL/min — ABNORMAL LOW (ref >60.00)
Glucose, Bld: 79 mg/dL (ref 70–99)

## 2012-09-13 LAB — HEPATIC FUNCTION PANEL
Albumin: 3.4 g/dL — ABNORMAL LOW (ref 3.5–5.2)
Total Protein: 7.1 g/dL (ref 6.0–8.3)

## 2012-09-24 ENCOUNTER — Ambulatory Visit: Payer: Medicare Other | Admitting: Cardiology

## 2012-10-03 ENCOUNTER — Ambulatory Visit (INDEPENDENT_AMBULATORY_CARE_PROVIDER_SITE_OTHER): Payer: Medicare Other | Admitting: Cardiology

## 2012-10-03 ENCOUNTER — Ambulatory Visit (INDEPENDENT_AMBULATORY_CARE_PROVIDER_SITE_OTHER): Payer: Medicare Other | Admitting: *Deleted

## 2012-10-03 ENCOUNTER — Encounter: Payer: Self-pay | Admitting: Cardiology

## 2012-10-03 ENCOUNTER — Other Ambulatory Visit: Payer: Self-pay | Admitting: *Deleted

## 2012-10-03 VITALS — BP 104/68 | HR 60 | Ht 64.0 in | Wt 149.0 lb

## 2012-10-03 DIAGNOSIS — I5032 Chronic diastolic (congestive) heart failure: Secondary | ICD-10-CM

## 2012-10-03 DIAGNOSIS — E785 Hyperlipidemia, unspecified: Secondary | ICD-10-CM

## 2012-10-03 DIAGNOSIS — I059 Rheumatic mitral valve disease, unspecified: Secondary | ICD-10-CM

## 2012-10-03 DIAGNOSIS — I10 Essential (primary) hypertension: Secondary | ICD-10-CM

## 2012-10-03 DIAGNOSIS — I5022 Chronic systolic (congestive) heart failure: Secondary | ICD-10-CM

## 2012-10-03 DIAGNOSIS — I4891 Unspecified atrial fibrillation: Secondary | ICD-10-CM

## 2012-10-03 DIAGNOSIS — R0609 Other forms of dyspnea: Secondary | ICD-10-CM

## 2012-10-03 DIAGNOSIS — I340 Nonrheumatic mitral (valve) insufficiency: Secondary | ICD-10-CM

## 2012-10-03 DIAGNOSIS — N189 Chronic kidney disease, unspecified: Secondary | ICD-10-CM

## 2012-10-03 DIAGNOSIS — I509 Heart failure, unspecified: Secondary | ICD-10-CM

## 2012-10-03 LAB — BASIC METABOLIC PANEL
CO2: 19 mEq/L (ref 19–32)
Calcium: 9.9 mg/dL (ref 8.4–10.5)
Creatinine, Ser: 2.2 mg/dL — ABNORMAL HIGH (ref 0.4–1.5)
GFR: 30.3 mL/min — ABNORMAL LOW (ref >60.00)
Sodium: 135 mEq/L (ref 135–145)

## 2012-10-03 LAB — POCT INR: INR: 2.3

## 2012-10-03 NOTE — Patient Instructions (Addendum)
Your physician recommends that you have  lab work today--BMET/BNP  Your physician has recommended that you have a pulmonary function test. Pulmonary Function Tests are a group of tests that measure how well air moves in and out of your lungs.  Your physician recommends that you schedule a follow-up appointment in: 1 month with Dr [name].

## 2012-10-04 NOTE — Progress Notes (Signed)
Patient ID: [name] [name], male   DOB: Sep 14, 1930, 78 y.o.   MRN: [id number] PCP: Dr. Linford Arnold  78 yo with history of CAD s/p CABG, ulcerative colitis s/p colectomy, and CHF presents for cardiology followup.  He had CABG in 10/11 complicated by post-operative atrial fibrillation.  He was started on amiodarone while in the hospital after CABG and this was continued due to risk of further atrial fibrillation in the setting of significantly enlarged left atrium.  He was admitted to the hospital in [place] in 7/12 with a diastolic CHF exacerbation and was diuresed.  I had him stop amiodarone in 2012 given no atrial fibrillation recurrence.    In 7/13, he began to note increased exertional dyspnea.  He went to the hospital in [place] in 7/13 with a diastolic CHF exacerbation.  I am not sure if he was in atrial fibrillation at that time as I have no records of that hospitalization.  It sounds like he was only in the hospital for 2-3 days.  He had an echo done there with EF 35-40%, severe MR, PA systolic pressure 50-60 mmHg.   At a prior appointment, [name] [name] was in atrial fibrillation with RVR.  I started him on coumadin (NOACs not covered by his insurance) and Toprol XL.  I sent him for TEE-guided cardioversion on amiodarone (INR did not stay therapeutic throught the pre-DCCV period so needed TEE).  He converted to NSR and remains in NSR today.  I did admit him after cardioversion in 9/13 for diuresis.  TEE prior to DCCV confirmed EF 35-40% with severe, post-infarction [name] in the setting of severe hypokinesis to akinesis of the inferior and posterior walls.   He continues to do well in NSR.  He walks 1-1.5 miles a day without dyspnea.  No chest pain, no orthopnea, no PND.  Overall feels like he is doing better. Weight is down 10 lbs.   Labs (9/12): LDL 81, HDL 54, K 4.2, creatinine 1.9, BNP 1065 Labs (11/12): K 4.1, creatinine 1.7, BNP 837 Labs (1/13): K 3.7, creatinine 1.6, HDL 52, LDL 100 Labs  (5/13): K 4.4, creatinine 1.5, HDL 47, LDL 76 Labs (7/13): creatinine 2.0 Labs (8/13): K 3.9, creatinine 1.9, BNP 1066 Labs (9/13): K 3.5, creatinine 2.4=>2.3, BNP 971, LFTs normal, TSH normal  PMH: 1. Paroxysmal atrial fibrillation: Noted only post-op CABG in 10/11.  Was not put on coumadin.  Initially on amiodarone but this was stopped. Back in atrial fibrillation in 8/13, amiodarone restarted along with coumadin.  TEE-guided DCCV in 9/13.   2. Knee osteoarthritis 3. Anemia of uncertain etiology: had erythropoietin shots for a period of time.  4. CAD: MI at age 78.  Had CABG in 10/11 with LIMA-LAD, SVG-D, and SVG-RCA.  Lexiscan myoview (9/13) with EF 34%, large inferior infarct from base to apex with no ischemia.   5. Systolic CHF: Echo (1/12) with EF 50%, inferior and inferoseptal hypokinesis, mild LV hypertrophy, moderate [name], severe left atrial enlargement.  Echo [facility], 7/13): EF 35-40%, severe [name], moderate-severe LAE, PASP 50-60 mmHg.  TEE (9/13): EF 35-40%, posterior and inferior severe HK to AK, severe post-infarction [name], mildly decreased RV systolic function.  6. Ulcerative colitis: s/p total colectomy with ileostomy.  7. HTN 8. Hyperlipidemia 9. Nephrolithiasis 10. CKD: H/o ARF in the setting of dehydration.  11. [name]: Severe on 7/13 echo and 9/13 TEE.  Suspect post-infarction [name] in the setting of inferoposterior wall motion abnormality.   SH: Retired from [employer].  Married, lives  in [place]. Stopped smoking in 1970.    FH: Strong history of CAD.  Father with MI at 40.  All siblings (11) have now passed away, many with heart disease.   ROS: All systems reviewed and negative except as per HPI.   Current Outpatient Prescriptions  Medication Sig Dispense Refill  . amiodarone (PACERONE) 200 MG tablet Take 200 mg by mouth daily.      [name] aspirin 81 MG tablet Take 1 tablet (81 mg total) by mouth daily.  30 tablet    . atorvastatin (LIPITOR) 40 MG tablet Take 40 mg by  mouth daily.      . Cholecalciferol (VITAMIN D-3 PO) Take 1,000 Units by mouth daily.       . ferrous sulfate 325 (65 FE) MG tablet Take 325 mg by mouth daily with breakfast.       . fish oil-omega-3 fatty acids 1000 MG capsule Take 2 g by mouth daily.       . Flaxseed, Linseed, (FLAX SEED OIL PO) Take 1 capsule by mouth daily.       . folic acid (FOLVITE) 400 MCG tablet Take 400 mcg by mouth daily.       . furosemide (LASIX) 40 MG tablet Take 1 tablet (40 mg total) by mouth 2 (two) times daily. HOLD today 9/6, restart in am on 9/7  60 tablet  11  . metoprolol succinate (TOPROL-XL) 50 MG 24 hr tablet Take 1 tablet (50 mg total) by mouth 2 (two) times daily.  180 tablet  3  . Multiple Vitamin (MULITIVITAMIN WITH MINERALS) TABS Take 1 tablet by mouth daily.      . Multiple Vitamins-Minerals (ICAPS AREDS FORMULA PO) Take 1 capsule by mouth daily.      . potassium chloride SA (K-DUR,KLOR-CON) 20 MEQ tablet Take 1 tablet (20 mEq total) by mouth daily. Take 1 whole tablet in AM and 1/2 tablet in PM  30 tablet  11  . spironolactone (ALDACTONE) 25 MG tablet 1/2 tablet (total 12.5mg ) daily      . Tamsulosin HCl (FLOMAX) 0.4 MG CAPS Take 0.4 mg by mouth daily.       [name] warfarin (COUMADIN) 5 MG tablet Take as directed by anticoagulation clinic  30 tablet  3  . lisinopril (PRINIVIL,ZESTRIL) 5 MG tablet Take 1 tablet (5 mg total) by mouth daily.      [name] DISCONTD: pantoprazole (PROTONIX) 40 MG tablet Take 40 mg by mouth daily.          BP 104/68  Pulse 60  Ht 5\' 4"  (1.626 m)  Wt 149 lb (67.586 kg)  BMI 25.58 kg/m2 General: NAD Neck: JVP not elevated, no thyromegaly or thyroid nodule.  Lungs: Clear to auscultation bilaterally with normal respiratory effort. CV: Nondisplaced PMI.  Heart irregular S1/S2, no S3/S4, 2/6 HSM at apex. No edema.  No carotid bruit.  Abdomen: Soft, nontender, no hepatosplenomegaly, no distention.  Neurologic: Alert and oriented x 3.  Psych: Normal affect. Extremities: No  clubbing or cyanosis.   Assessment/Plan  1. Atrial fibrillation  Patient has been cardioverted to NSR and remains in NSR today on amiodarone.   It is possible that his cardiomyopathy is partly tachycardia-mediated.  - Continue coumadin, Toprol XL, amiodarone.  - TSH and LFTs normal recently.  He will need yearly eye exam.  I will get baseline PFTs.  2. Chronic systolic CHF (congestive heart failure)  EF 35-40% on recent TEE.  Cannot rule out component of tachy-mediated cardiomyopathy but suspect  ischemic cardiomyopathy plays major role. He denies chest pain. Volume status is much improved and weight is down 10 lbs.  NYHA class II.   - Continue current doses of Toprol XL and spironolactone.   - BMET/BNP today, if stable will increase lisinopril to 5 mg daily.  - Continue current dose of Lasix for ongoing gentle diuresis.   - I will repeat echo in 11/13.  3. HYPERLIPIDEMIA  LDL near goal when recently checked (goal < 70).  4. Mitral regurgitation  Severe on recent TEE.  This appeared to be post-infarction [name] with inferoposterior severe hypokinesis to akinesis.  We talked at length today about surgery to correct the [name].  He would be a difficult candidate for MVR given prior sternotomy and significant CKD.   He does not want to consider redo heart surgery.  I think this is reasonable given the risk and will try to manage him as well as we can medically.  5. CAD  Status post CABG.  Lexiscan myoview showed dense inferior scar with no ischemia.  Continue medical management.  He is on ASA 81, statin, Toprol XL, ACEI.    6. CKD (chronic kidney disease) BMET/BNP today.   [name] 10/04/2012

## 2012-10-09 ENCOUNTER — Ambulatory Visit (INDEPENDENT_AMBULATORY_CARE_PROVIDER_SITE_OTHER): Payer: Medicare Other | Admitting: Internal Medicine

## 2012-10-09 DIAGNOSIS — I4891 Unspecified atrial fibrillation: Secondary | ICD-10-CM

## 2012-10-09 LAB — PULMONARY FUNCTION TEST

## 2012-10-09 NOTE — Progress Notes (Signed)
PFT done today. 

## 2012-10-12 ENCOUNTER — Emergency Department (HOSPITAL_BASED_OUTPATIENT_CLINIC_OR_DEPARTMENT_OTHER): Payer: Medicare Other

## 2012-10-12 ENCOUNTER — Encounter (HOSPITAL_BASED_OUTPATIENT_CLINIC_OR_DEPARTMENT_OTHER): Payer: Self-pay | Admitting: *Deleted

## 2012-10-12 ENCOUNTER — Inpatient Hospital Stay (HOSPITAL_BASED_OUTPATIENT_CLINIC_OR_DEPARTMENT_OTHER)
Admission: EM | Admit: 2012-10-12 | Discharge: 2012-10-15 | DRG: 309 | Disposition: A | Discharge status: Home / Self Care | Payer: Medicare Other | Attending: Internal Medicine | Admitting: Internal Medicine

## 2012-10-12 DIAGNOSIS — I5022 Chronic systolic (congestive) heart failure: Secondary | ICD-10-CM | POA: Diagnosis present

## 2012-10-12 DIAGNOSIS — Z87891 Personal history of nicotine dependence: Secondary | ICD-10-CM

## 2012-10-12 DIAGNOSIS — N183 Chronic kidney disease, stage 3 unspecified: Secondary | ICD-10-CM | POA: Diagnosis present

## 2012-10-12 DIAGNOSIS — N189 Chronic kidney disease, unspecified: Secondary | ICD-10-CM

## 2012-10-12 DIAGNOSIS — I480 Paroxysmal atrial fibrillation: Secondary | ICD-10-CM | POA: Diagnosis present

## 2012-10-12 DIAGNOSIS — E875 Hyperkalemia: Secondary | ICD-10-CM

## 2012-10-12 DIAGNOSIS — Z7982 Long term (current) use of aspirin: Secondary | ICD-10-CM

## 2012-10-12 DIAGNOSIS — I4891 Unspecified atrial fibrillation: Secondary | ICD-10-CM

## 2012-10-12 DIAGNOSIS — Z7901 Long term (current) use of anticoagulants: Secondary | ICD-10-CM

## 2012-10-12 DIAGNOSIS — Z8719 Personal history of other diseases of the digestive system: Secondary | ICD-10-CM

## 2012-10-12 DIAGNOSIS — Z933 Colostomy status: Secondary | ICD-10-CM

## 2012-10-12 DIAGNOSIS — I959 Hypotension, unspecified: Secondary | ICD-10-CM

## 2012-10-12 DIAGNOSIS — D509 Iron deficiency anemia, unspecified: Secondary | ICD-10-CM | POA: Diagnosis present

## 2012-10-12 DIAGNOSIS — E872 Acidosis, unspecified: Secondary | ICD-10-CM | POA: Diagnosis present

## 2012-10-12 DIAGNOSIS — I9589 Other hypotension: Secondary | ICD-10-CM | POA: Diagnosis present

## 2012-10-12 DIAGNOSIS — E861 Hypovolemia: Secondary | ICD-10-CM

## 2012-10-12 DIAGNOSIS — Z8249 Family history of ischemic heart disease and other diseases of the circulatory system: Secondary | ICD-10-CM

## 2012-10-12 DIAGNOSIS — Z79899 Other long term (current) drug therapy: Secondary | ICD-10-CM

## 2012-10-12 DIAGNOSIS — N179 Acute kidney failure, unspecified: Secondary | ICD-10-CM | POA: Diagnosis present

## 2012-10-12 DIAGNOSIS — R001 Bradycardia, unspecified: Secondary | ICD-10-CM

## 2012-10-12 DIAGNOSIS — E785 Hyperlipidemia, unspecified: Secondary | ICD-10-CM | POA: Diagnosis present

## 2012-10-12 DIAGNOSIS — I059 Rheumatic mitral valve disease, unspecified: Secondary | ICD-10-CM | POA: Diagnosis present

## 2012-10-12 DIAGNOSIS — I2589 Other forms of chronic ischemic heart disease: Secondary | ICD-10-CM | POA: Diagnosis present

## 2012-10-12 DIAGNOSIS — I5023 Acute on chronic systolic (congestive) heart failure: Secondary | ICD-10-CM

## 2012-10-12 DIAGNOSIS — I498 Other specified cardiac arrhythmias: Principal | ICD-10-CM | POA: Diagnosis present

## 2012-10-12 DIAGNOSIS — I252 Old myocardial infarction: Secondary | ICD-10-CM

## 2012-10-12 DIAGNOSIS — K5190 Ulcerative colitis, unspecified, without complications: Secondary | ICD-10-CM | POA: Diagnosis present

## 2012-10-12 DIAGNOSIS — I340 Nonrheumatic mitral (valve) insufficiency: Secondary | ICD-10-CM | POA: Diagnosis present

## 2012-10-12 DIAGNOSIS — I2510 Atherosclerotic heart disease of native coronary artery without angina pectoris: Secondary | ICD-10-CM | POA: Diagnosis present

## 2012-10-12 DIAGNOSIS — I129 Hypertensive chronic kidney disease with stage 1 through stage 4 chronic kidney disease, or unspecified chronic kidney disease: Secondary | ICD-10-CM | POA: Diagnosis present

## 2012-10-12 DIAGNOSIS — I509 Heart failure, unspecified: Secondary | ICD-10-CM | POA: Diagnosis present

## 2012-10-12 DIAGNOSIS — Z951 Presence of aortocoronary bypass graft: Secondary | ICD-10-CM

## 2012-10-12 DIAGNOSIS — N19 Unspecified kidney failure: Secondary | ICD-10-CM

## 2012-10-12 LAB — URINALYSIS, MICROSCOPIC ONLY
Bilirubin Urine: NEGATIVE
Glucose, UA: NEGATIVE mg/dL
Ketones, ur: NEGATIVE mg/dL
Nitrite: NEGATIVE
Protein, ur: NEGATIVE mg/dL
Specific Gravity, Urine: 1.014 (ref 1.005–1.030)
Urobilinogen, UA: 0.2 mg/dL (ref 0.0–1.0)
pH: 5 (ref 5.0–8.0)

## 2012-10-12 LAB — PROTIME-INR
INR: 2.16 — ABNORMAL HIGH (ref 0.00–1.49)
Prothrombin Time: 23.2 seconds — ABNORMAL HIGH (ref 11.6–15.2)

## 2012-10-12 LAB — COMPREHENSIVE METABOLIC PANEL
ALT: 28 U/L (ref 0–53)
Albumin: 3.6 g/dL (ref 3.5–5.2)
Alkaline Phosphatase: 82 U/L (ref 39–117)
BUN: 86 mg/dL — ABNORMAL HIGH (ref 6–23)
Chloride: 99 mEq/L (ref 96–112)
Potassium: 5.8 mEq/L — ABNORMAL HIGH (ref 3.5–5.1)
Sodium: 133 mEq/L — ABNORMAL LOW (ref 135–145)
Total Bilirubin: 0.4 mg/dL (ref 0.3–1.2)

## 2012-10-12 LAB — CBC WITH DIFFERENTIAL/PLATELET
Basophils Absolute: 0 10*3/uL (ref 0.0–0.1)
Basophils Relative: 0 % (ref 0–1)
Eosinophils Absolute: 0.1 10*3/uL (ref 0.0–0.7)
Eosinophils Relative: 1 % (ref 0–5)
HCT: 34.6 % — ABNORMAL LOW (ref 39.0–52.0)
Hemoglobin: 11.9 g/dL — ABNORMAL LOW (ref 13.0–17.0)
Lymphocytes Relative: 14 % (ref 12–46)
Lymphs Abs: 1.2 10*3/uL (ref 0.7–4.0)
MCH: 27.8 pg (ref 26.0–34.0)
MCHC: 34.4 g/dL (ref 30.0–36.0)
MCV: 80.8 fL (ref 78.0–100.0)
Monocytes Absolute: 0.7 K/uL (ref 0.1–1.0)
Monocytes Relative: 9 % (ref 3–12)
Neutro Abs: 6.4 K/uL (ref 1.7–7.7)
Neutrophils Relative %: 76 % (ref 43–77)
Platelets: 146 10*3/uL — ABNORMAL LOW (ref 150–400)
RBC: 4.28 MIL/uL (ref 4.22–5.81)
RDW: 16.4 % — ABNORMAL HIGH (ref 11.5–15.5)
WBC: 8.4 10*3/uL (ref 4.0–10.5)

## 2012-10-12 LAB — TROPONIN I: Troponin I: <0.3 ng/mL (ref <0.30)

## 2012-10-12 LAB — COMPREHENSIVE METABOLIC PANEL WITH GFR
AST: 35 U/L (ref 0–37)
CO2: 16 meq/L — ABNORMAL LOW (ref 19–32)
Calcium: 10 mg/dL (ref 8.4–10.5)
Creatinine, Ser: 5.2 mg/dL — ABNORMAL HIGH (ref 0.50–1.35)
GFR calc Af Amer: 11 mL/min — ABNORMAL LOW (ref >90)
GFR calc non Af Amer: 9 mL/min — ABNORMAL LOW (ref >90)
Glucose, Bld: 87 mg/dL (ref 70–99)
Total Protein: 8.2 g/dL (ref 6.0–8.3)

## 2012-10-12 LAB — APTT: aPTT: 72 s — ABNORMAL HIGH (ref 24–37)

## 2012-10-12 LAB — LACTIC ACID, PLASMA: Lactic Acid, Venous: 1.3 mmol/L (ref 0.5–2.2)

## 2012-10-12 LAB — PRO B NATRIURETIC PEPTIDE: Pro B Natriuretic peptide (BNP): 5468 pg/mL — ABNORMAL HIGH (ref 0–450)

## 2012-10-12 MED ORDER — ATROPINE SULFATE 1 MG/ML IJ SOLN
0.5000 mg | Freq: Once | INTRAMUSCULAR | Status: AC
  Administered 2012-10-12: 0.5 mg via INTRAVENOUS

## 2012-10-12 MED ORDER — OMEGA-3 FATTY ACIDS 1000 MG PO CAPS: 2.0000 g | ORAL_CAPSULE | Freq: Every day | ORAL | Status: DC

## 2012-10-12 MED ORDER — SODIUM CHLORIDE 0.9 % IJ SOLN
3.0000 mL | Freq: Two times a day (BID) | INTRAMUSCULAR | Status: DC
  Administered 2012-10-14 – 2012-10-15 (×2): 3 mL via INTRAVENOUS

## 2012-10-12 MED ORDER — ASPIRIN 81 MG PO CHEW
81.0000 mg | CHEWABLE_TABLET | Freq: Every day | ORAL | Status: DC
  Administered 2012-10-12 – 2012-10-15 (×4): 81 mg via ORAL
  Filled 2012-10-12 (×4): qty 1

## 2012-10-12 MED ORDER — SODIUM CHLORIDE 0.9 % IV BOLUS (SEPSIS)
500.0000 mL | Freq: Once | INTRAVENOUS | Status: AC
  Administered 2012-10-12: 500 mL via INTRAVENOUS

## 2012-10-12 MED ORDER — SODIUM CHLORIDE 0.9 % IV SOLN
INTRAVENOUS | Status: DC
  Administered 2012-10-13 – 2012-10-14 (×2): via INTRAVENOUS

## 2012-10-12 MED ORDER — ATORVASTATIN CALCIUM 40 MG PO TABS
40.0000 mg | ORAL_TABLET | Freq: Every day | ORAL | Status: DC
  Administered 2012-10-12 – 2012-10-14 (×4): 40 mg via ORAL
  Filled 2012-10-12 (×5): qty 1

## 2012-10-12 MED ORDER — BIOTENE DRY MOUTH MT LIQD
15.0000 mL | Freq: Two times a day (BID) | OROMUCOSAL | Status: DC
  Administered 2012-10-12 – 2012-10-15 (×6): 15 mL via OROMUCOSAL

## 2012-10-12 MED ORDER — OMEGA-3-ACID ETHYL ESTERS 1 G PO CAPS
2.0000 g | ORAL_CAPSULE | Freq: Every day | ORAL | Status: DC
  Administered 2012-10-12 – 2012-10-15 (×4): 2 g via ORAL
  Filled 2012-10-12 (×4): qty 2

## 2012-10-12 MED ORDER — SODIUM CHLORIDE 0.9 % IV SOLN: INTRAVENOUS | Status: DC

## 2012-10-12 MED ORDER — WARFARIN SODIUM 2.5 MG PO TABS
2.5000 mg | ORAL_TABLET | ORAL | Status: DC
  Administered 2012-10-12: 2.5 mg via ORAL
  Filled 2012-10-12 (×3): qty 1

## 2012-10-12 MED ORDER — PNEUMOCOCCAL VAC POLYVALENT 25 MCG/0.5ML IJ INJ
0.5000 mL | INJECTION | INTRAMUSCULAR | Status: AC
  Administered 2012-10-13: 0.5 mL via INTRAMUSCULAR
  Filled 2012-10-12: qty 0.5

## 2012-10-12 MED ORDER — ONDANSETRON HCL 4 MG PO TABS: 4.0000 mg | ORAL_TABLET | Freq: Four times a day (QID) | ORAL | Status: DC | PRN

## 2012-10-12 MED ORDER — SODIUM POLYSTYRENE SULFONATE 15 GM/60ML PO SUSP
15.0000 g | Freq: Once | ORAL | Status: AC
  Administered 2012-10-12: 15 g via ORAL
  Filled 2012-10-12: qty 60

## 2012-10-12 MED ORDER — WARFARIN SODIUM 5 MG PO TABS
5.0000 mg | ORAL_TABLET | ORAL | Status: DC
  Filled 2012-10-12: qty 1

## 2012-10-12 MED ORDER — ONDANSETRON HCL 4 MG/2ML IJ SOLN
4.0000 mg | Freq: Once | INTRAMUSCULAR | Status: AC
  Administered 2012-10-12: 4 mg via INTRAVENOUS

## 2012-10-12 MED ORDER — ASPIRIN 81 MG PO TABS: 81.0000 mg | ORAL_TABLET | Freq: Every day | ORAL | Status: DC

## 2012-10-12 MED ORDER — ADULT MULTIVITAMIN W/MINERALS CH
1.0000 | ORAL_TABLET | Freq: Every day | ORAL | Status: DC
  Administered 2012-10-12 – 2012-10-13 (×2): 1 via ORAL
  Filled 2012-10-12 (×2): qty 1

## 2012-10-12 MED ORDER — ACETAMINOPHEN 650 MG RE SUPP: 650.0000 mg | Freq: Four times a day (QID) | RECTAL | Status: DC | PRN

## 2012-10-12 MED ORDER — AMIODARONE HCL 200 MG PO TABS
200.0000 mg | ORAL_TABLET | Freq: Every day | ORAL | Status: DC
  Administered 2012-10-12 – 2012-10-15 (×3): 200 mg via ORAL
  Filled 2012-10-12 (×4): qty 1

## 2012-10-12 MED ORDER — GLUCAGON HCL (RDNA) 1 MG IJ SOLR
5.0000 mg | Freq: Once | INTRAVENOUS | Status: AC
  Administered 2012-10-12: 5 mg via INTRAVENOUS
  Filled 2012-10-12: qty 1

## 2012-10-12 MED ORDER — ACETAMINOPHEN 325 MG PO TABS: 650.0000 mg | ORAL_TABLET | Freq: Four times a day (QID) | ORAL | Status: DC | PRN

## 2012-10-12 MED ORDER — FOLIC ACID 0.5 MG HALF TAB
500.0000 ug | ORAL_TABLET | Freq: Every day | ORAL | Status: DC
  Administered 2012-10-12 – 2012-10-15 (×4): 0.5 mg via ORAL
  Filled 2012-10-12 (×4): qty 1

## 2012-10-12 MED ORDER — FERROUS SULFATE 325 (65 FE) MG PO TABS
325.0000 mg | ORAL_TABLET | Freq: Every day | ORAL | Status: DC
  Administered 2012-10-13 – 2012-10-15 (×3): 325 mg via ORAL
  Filled 2012-10-12 (×4): qty 1

## 2012-10-12 MED ORDER — ONDANSETRON HCL 4 MG/2ML IJ SOLN
INTRAMUSCULAR | Status: AC
  Administered 2012-10-12: 4 mg via INTRAVENOUS
  Filled 2012-10-12: qty 2

## 2012-10-12 MED ORDER — TAMSULOSIN HCL 0.4 MG PO CAPS
0.4000 mg | ORAL_CAPSULE | Freq: Every day | ORAL | Status: DC
  Administered 2012-10-12 – 2012-10-14 (×4): 0.4 mg via ORAL
  Filled 2012-10-12 (×5): qty 1

## 2012-10-12 MED ORDER — SODIUM CHLORIDE 0.9 % IV BOLUS (SEPSIS)
250.0000 mL | Freq: Once | INTRAVENOUS | Status: AC
  Administered 2012-10-12: 250 mL via INTRAVENOUS

## 2012-10-12 MED ORDER — ONDANSETRON HCL 4 MG/2ML IJ SOLN: 4.0000 mg | Freq: Four times a day (QID) | INTRAMUSCULAR | Status: DC | PRN

## 2012-10-12 MED ORDER — WARFARIN SODIUM 5 MG PO TABS: 5.0000 mg | ORAL_TABLET | Freq: Every day | ORAL | Status: DC

## 2012-10-12 MED ORDER — WARFARIN - PHARMACIST DOSING INPATIENT
Freq: Every day | Status: DC
  Administered 2012-10-14: 18:00:00

## 2012-10-12 NOTE — ED Notes (Signed)
Pt transported to [facility] via [facility] in stable condition.

## 2012-10-12 NOTE — H&P (Signed)
[facility] History and Physical  Destiny Trickey NFA:[id number] DOB: 25-Jan-1930 DOA: 10/12/2012  Referring physician: Quita Skye PCP: Seymour Bars, DO  Specialists: [name] [facility] [place] [facility]  Chief Complaint: Dizziness  HPI: [name] [name] is a 78 y.o. 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 Patient presented to [facility] with dizziness. Patient reported to have dizziness about 3 days ago, but mainly happen when he gets up from lying down or sitting position. He never collapses, denies any loss of consciousness, denies any shortness of breath, chest pain, palpitations or lower extremity swelling. He drove himself to an urgent care [facility] which they instructed him to go to [employer] [employer] Upon initial evaluation over there he was found to have hypotension with his BP in the 60s and extreme bradycardia with heart rate in the 30s to 40s. He also has acute on chronic renal failure with creatinine of 5.2. Patient was given bolus of 500 mL of NS and his SBP improved to over 100. He was given glucagon and his heart rate went up to the 60s. He does take Lasix, lisinopril, spironolactone, Toprol-XL and amiodarone. Patient was sent to the hospital for further evaluation.    Review of Systems:  Constitutional: Complains about dizziness, postural Eyes: negative for irritation, redness and visual disturbance Ears, nose, mouth, throat, and face: negative for earaches, epistaxis, nasal congestion and sore throat Respiratory: negative for cough, dyspnea on exertion, sputum and wheezing Cardiovascular: negative for chest pain, dyspnea, lower extremity edema, orthopnea, palpitations and syncope Gastrointestinal: negative for abdominal pain, constipation, diarrhea, melena, nausea and vomiting Genitourinary:negative for dysuria,  frequency and hematuria Hematologic/lymphatic: negative for bleeding, easy bruising and lymphadenopathy Musculoskeletal:negative for arthralgias, muscle weakness and stiff joints Neurological: negative for coordination problems, gait problems, headaches and weakness Endocrine: negative for diabetic symptoms including polydipsia, polyuria and weight loss Allergic/Immunologic: negative for anaphylaxis, hay fever and urticaria   Past Medical History  Diagnosis Date  . Coronary artery disease   . MI, old     AGE 78  . Ulcerative colitis   . Hyperlipidemia   . Hypertension   . Acute renal failure   . History of nephrolithiasis   . PAF (paroxysmal atrial fibrillation)   . LV dysfunction   . Myocardial infarction   . Anemia   . Blood transfusion   . Arthritis   . CHF (congestive heart failure)    Past Surgical History  Procedure Date  . Cardiac catheterization 10/14/2010    EF 30%  . Coronary artery bypass graft 10/24/2010    LIMA TO THE LAD, A SAPHENOUS VEIN GRAFT TO THE DIAGONAL 1 AND 3, AND SAPHENOUS VEIN GRAFT TO THE DISTAL RIGHT CORONARY ARTERY  . Ileostomy   . Colectomy   . Cataract extraction   . Transthoracic echocardiogram 01/16/2011    EF 50%  . Cardiovascular stress test 10/07/2010    EF 33%  . Appendectomy   . Cystostomy 04/22/2012    Procedure: CYSTOSTOMY SUPRAPUBIC;  Surgeon: Garnett Farm, MD;  Location: [facility] ORS;  Service: Urology;  Laterality: N/A;  CYSTOTOMY WITH MIDLINE INCISION REMOVAL OF STONE  . Tee without cardioversion 08/28/2012    Procedure: TRANSESOPHAGEAL ECHOCARDIOGRAM (TEE);  Surgeon: [name], MD;  Location: [facility] ENDOSCOPY;  Service: Cardiovascular;  Laterality: N/A;  . Cardioversion 08/28/2012    Procedure: CARDIOVERSION;  Surgeon: Freida Busman  Alford Highland, MD;  Location: [facility] ENDOSCOPY;  Service: Cardiovascular;  Laterality: N/A;   Social History:  reports that he quit smoking about 43 years ago. His smoking use included Cigarettes and Cigars. He has a 15  pack-year smoking history. He does not have any smokeless tobacco history on file. He reports that he does not drink alcohol or use illicit drugs.   No Known Allergies  Family History  Problem Relation Age of Onset  . Cancer Mother     colon  . Heart attack Father   . Hypertension Father   . Heart disease Sister   . Hypertension Sister   . Heart disease Brother   . Hypertension Brother   . Heart disease Sister   . Hypertension Sister   . Heart disease Sister   . Hypertension Sister   . Heart disease Brother   . Hypertension Brother   . Heart disease Brother   . Hypertension Brother   . Heart disease Brother   . Hypertension Brother   . Heart disease Brother   . Hypertension Brother   . Heart disease Brother   . Hypertension Brother   . Heart disease Brother   . Hypertension Brother   . Heart disease Brother   . Hypertension Brother     Prior to Admission medications   Medication Sig Start Date End Date Taking? Authorizing Provider  amiodarone (PACERONE) 200 MG tablet Take 200 mg by mouth daily.    Historical Provider, MD  aspirin 81 MG tablet Take 1 tablet (81 mg total) by mouth daily. 08/30/12 08/30/13  Joline Salt Barrett, PA  atorvastatin (LIPITOR) 40 MG tablet Take 40 mg by mouth daily.    Historical Provider, MD  Cholecalciferol (VITAMIN D-3 PO) Take 1,000 Units by mouth daily.     Historical Provider, MD  ferrous sulfate 325 (65 FE) MG tablet Take 325 mg by mouth daily with breakfast.     Historical Provider, MD  fish oil-omega-3 fatty acids 1000 MG capsule Take 2 g by mouth daily.     Historical Provider, MD  Flaxseed, Linseed, (FLAX SEED OIL PO) Take 1 capsule by mouth daily.     Historical Provider, MD  folic acid (FOLVITE) 400 MCG tablet Take 400 mcg by mouth daily.     Historical Provider, MD  furosemide (LASIX) 40 MG tablet Take 1 tablet (40 mg total) by mouth 2 (two) times daily. HOLD today 9/6, restart in am on 9/7 08/30/12   Joline Salt Barrett, PA  lisinopril  (PRINIVIL,ZESTRIL) 5 MG tablet Take 1 tablet (5 mg total) by mouth daily. 10/03/12   [name], MD  metoprolol succinate (TOPROL-XL) 50 MG 24 hr tablet Take 1 tablet (50 mg total) by mouth 2 (two) times daily. 09/04/12   [name], MD  Multiple Vitamin (MULITIVITAMIN WITH MINERALS) TABS Take 1 tablet by mouth daily.    Historical Provider, MD  Multiple Vitamins-Minerals (ICAPS AREDS FORMULA PO) Take 1 capsule by mouth daily.    Historical Provider, MD  potassium chloride SA (K-DUR,KLOR-CON) 20 MEQ tablet Take 1 tablet (20 mEq total) by mouth daily. Take 1 whole tablet in AM and 1/2 tablet in PM 08/30/12   Joline Salt Barrett, PA  spironolactone (ALDACTONE) 25 MG tablet 1/2 tablet (total 12.5mg ) daily 09/04/12   [name], MD  Tamsulosin HCl (FLOMAX) 0.4 MG CAPS Take 0.4 mg by mouth daily.     Historical Provider, MD  warfarin (COUMADIN) 5 MG tablet Take as directed  by anticoagulation clinic 09/06/12   [name], MD   Physical Exam: Filed Vitals:   10/12/12 1700 10/12/12 1730 10/12/12 1745 10/12/12 1800  BP:  118/54 128/48 106/42  Pulse:  56 59 56  Temp:      TempSrc:      Resp:  13 17 15   Height: 5\' 4"  (1.626 m)     Weight: 66.044 kg (145 lb 9.6 oz)     SpO2:  98% 100% 100%   General appearance: alert, cooperative and no distress  Head: Normocephalic, without obvious abnormality, atraumatic  Eyes: conjunctivae/corneas clear. PERRL, EOM's intact. Fundi benign.  Nose: Nares normal. Septum midline. Mucosa normal. No drainage or sinus tenderness.  Throat: lips, mucosa, and tongue normal; teeth and gums normal  Neck: Supple, no masses, no cervical lymphadenopathy, no JVD appreciated, no meningeal signs Resp: clear to auscultation bilaterally  Chest wall: no tenderness  Cardio: regular rate and rhythm, S1, S2 normal, no murmur, click, rub or gallop  GI: soft, non-tender; bowel sounds normal; no masses, no organomegaly  Extremities: extremities normal, atraumatic, no cyanosis  or edema  Skin: Skin color, texture, turgor normal. No rashes or lesions  Neurologic: Alert and oriented X 3, normal strength and tone. Normal symmetric reflexes. Normal coordination and gait   Labs on Admission:  Basic Metabolic Panel:  Lab 10/12/12 4098  NA 133*  K 5.8*  CL 99  CO2 16*  GLUCOSE 87  BUN 86*  CREATININE 5.20*  CALCIUM 10.0  MG --  PHOS --   Liver Function Tests:  Lab 10/12/12 1212  AST 35  ALT 28  ALKPHOS 82  BILITOT 0.4  PROT 8.2  ALBUMIN 3.6   No results found for this basename: LIPASE:5,AMYLASE:5 in the last 168 hours No results found for this basename: AMMONIA:5 in the last 168 hours CBC:  Lab 10/12/12 1212  WBC 8.4  NEUTROABS 6.4  HGB 11.9*  HCT 34.6*  MCV 80.8  PLT 146*   Cardiac Enzymes:  Lab 10/12/12 1212  CKTOTAL --  CKMB --  CKMBINDEX --  TROPONINI <0.30    BNP (last 3 results)  Basename 10/12/12 1212 10/03/12 0903 09/13/12 0911  PROBNP 5468.0* 187.0* 971.0*   CBG: No results found for this basename: GLUCAP:5 in the last 168 hours  Radiological Exams on Admission: US Renal  10/12/2012  *RADIOLOGY REPORT*  Clinical Data:  Renal failure.  History of kidney stones.  RENAL/URINARY TRACT ULTRASOUND COMPLETE  Comparison:  None.  Findings:  Right Kidney:  Measures 10.7 cm.   Size and parenchymal echogenicity.  Multiple cyst noted within the upper pole the right kidney.  The largest measures 4.1 x 3.9 x 3.5 cm. Stone is identified within the inferior pole collecting system of the right kidney measuring 1.3 cm.  No evidence of mass or hydronephrosis.  Left Kidney:  Measures 12 cm.  Normal in size and parenchymal echogenicity.  No evidence of mass or hydronephrosis. Multiple cysts are identified.  Largest cyst is in the inferior pole and contains an eccentric area of mural calcification or echogenic nodule.  This measures 4.1 x 4.4 x 4.6 cm  Bladder:  Collapsed around a Foley catheter.  IMPRESSION:  1.  Bilateral renal cysts.  A  complicated cyst within the inferior pole of the left kidney measures 4 cm.  Advise follow-up imaging with non emergent CT or MRI. 2.  No obstructive uropathy. 3.  Nonobstructing right renal stone.   Original Report Authenticated By: Rosealee Albee,  M.D.    Dg Chest Port 1 View  10/12/2012  *RADIOLOGY REPORT*  Clinical Data: Weakness  , dizziness  PORTABLE CHEST - 1 VIEW  Comparison:  04/15/2012  Findings: Cardiomediastinal silhouette is stable.  Status post median sternotomy.  No acute infiltrate or pleural effusion.  No pulmonary edema.  IMPRESSION: No active disease.   Original Report Authenticated By: Natasha Mead, M.D.     EKG: Independently reviewed.   Assessment/Plan Active Problems:  ULCERATIVE COLITIS, HX OF  Mitral regurgitation  Chronic systolic CHF (congestive heart failure)  CKD (chronic kidney disease)  Acute on chronic renal failure  Hypotension  Bradycardia  Paroxysmal atrial fibrillation   Acute on chronic renal failure -Patient has CKD stage III, with baseline creatinine of 2.2. -Patient is chronically on Lasix. 6 weeks ago Aldactone was added in about a week ago lisinopril increased to 5 from 2.5. -Creatinine jumped from 2.22 5.2. Patient still makes urine, per nursing staff he had 350 mL urine. -Nephrotoxic medications held including Lasix, Aldactone and lisinopril. -I will hydrate gently with IV fluids, check BMP daily, follow patient clinically closely as he does have history of systolic CHF. -If creatinine is not improving rapidly, we will consider to call nephrology.  Hypotension -This is likely multifactorial secondary to volume depletion and medications affect. -His blood pressure medications were held including Toprol-XL, lisinopril, Lasix and Aldactone. -His blood pressure improved after a small bolus of normal saline, this is stable now.  Bradycardia -His heart rate went down to 37, patient is on amiodarone and Toprol-XL. -He was given glucagon and his  heart rate went up to 67. Now in the 50s. -He is asymptomatic, denies any chest pain or shortness of breath. Continue to hold AV nodal blocking medications. -Cardiology is being consulted for medication adjustment.  Chronic systolic CHF -LVEF of 35-40% in September of 2013, patient follows with [facility] cardiology Dr. [name]. -Does not have any symptoms or signs suggesting decompensation. -His BNP is 5468, his baseline is 180 in 10/03/2012, as mentioned above no evidence of acute CHF. -Follow clinically very closely as he will get IV fluids.  Paroxysmal atrial fibrillation -Patient is on amiodarone and Toprol-XL for rate control. He is on Coumadin for thromboembolic prevention. -Had DCCV on 09/06/2012 with Dr. [name]. His rate now is NSR. -I have asked pharmacy to dose Coumadin  I have asked Dr. Charm Barges to evaluate the patient and to take over as [facility] does not have a lot to offer in this gentleman care.  Code Status: Full code Family Communication: Disposition Plan: Stepdown at least for tonight because of the hypotension and bradycardia.  Time spent: 70 minutes  [facility] Pager [phone]  If 7PM-7AM, please contact night-coverage [email or web address] Password [email or web address] 10/12/2012, 6:08 PM

## 2012-10-12 NOTE — ED Provider Notes (Addendum)
History     CSN: [id number]  Arrival date & time 10/12/12  1145   First MD Initiated Contact with Patient 10/12/12 1205      Chief Complaint  Patient presents with  . Hypotension    (Consider location/radiation/quality/duration/timing/severity/associated sxs/prior treatment) HPI Comments: Weakness and dizziness since yesterday, waxing and waning, worse with standing.  Pt has been on spironolactone for a few weeks, has had increase in urinary freq.  Also increase in metoprolol about 1 month ago.  Cardiologist is Dr. [name].  PT denies SOB or CP.  Was able to drive himself here.  No N/V/D.  No fevers, chills, no cold symptoms.  Pt has ileostomy, no black stools noted.  Pt is on coumadin and amiodarone for atrial fibrillation.  Level 5 caveat due to condition.  Pt denies syncope.    The history is provided by the patient.    Past Medical History  Diagnosis Date  . Coronary artery disease   . MI, old     AGE 50  . Ulcerative colitis   . Hyperlipidemia   . Hypertension   . Acute renal failure   . History of nephrolithiasis   . PAF (paroxysmal atrial fibrillation)   . LV dysfunction   . Myocardial infarction   . Anemia   . Blood transfusion   . Arthritis   . CHF (congestive heart failure)     Past Surgical History  Procedure Date  . Cardiac catheterization 10/14/2010    EF 30%  . Coronary artery bypass graft 10/24/2010    LIMA TO THE LAD, A SAPHENOUS VEIN GRAFT TO THE DIAGONAL 1 AND 3, AND SAPHENOUS VEIN GRAFT TO THE DISTAL RIGHT CORONARY ARTERY  . Ileostomy   . Colectomy   . Cataract extraction   . Transthoracic echocardiogram 01/16/2011    EF 50%  . Cardiovascular stress test 10/07/2010    EF 33%  . Appendectomy   . Cystostomy 04/22/2012    Procedure: CYSTOSTOMY SUPRAPUBIC;  Surgeon: Garnett Farm, MD;  Location: [facility] ORS;  Service: Urology;  Laterality: N/A;  CYSTOTOMY WITH MIDLINE INCISION REMOVAL OF STONE  . Tee without cardioversion 08/28/2012    Procedure:  TRANSESOPHAGEAL ECHOCARDIOGRAM (TEE);  Surgeon: [name], MD;  Location: [facility] ENDOSCOPY;  Service: Cardiovascular;  Laterality: N/A;  . Cardioversion 08/28/2012    Procedure: CARDIOVERSION;  Surgeon: [name], MD;  Location: [facility] ENDOSCOPY;  Service: Cardiovascular;  Laterality: N/A;    Family History  Problem Relation Age of Onset  . Cancer Mother     colon  . Heart attack Father   . Hypertension Father   . Heart disease Sister   . Hypertension Sister   . Heart disease Brother   . Hypertension Brother   . Heart disease Sister   . Hypertension Sister   . Heart disease Sister   . Hypertension Sister   . Heart disease Brother   . Hypertension Brother   . Heart disease Brother   . Hypertension Brother   . Heart disease Brother   . Hypertension Brother   . Heart disease Brother   . Hypertension Brother   . Heart disease Brother   . Hypertension Brother   . Heart disease Brother   . Hypertension Brother   . Heart disease Brother   . Hypertension Brother     History  Substance Use Topics  . Smoking status: Former Smoker -- 1.0 packs/day for 15 years    Types: Cigarettes, Cigars    Quit  date: 12/25/1968  . Smokeless tobacco: Not on file  . Alcohol Use: No     occasional glass of wine      Review of Systems  Unable to perform ROS: Unstable vital signs    Allergies  Review of patient's allergies indicates no known allergies.  Home Medications   Current Outpatient Rx  Name Route Sig Dispense Refill  . AMIODARONE HCL 200 MG PO TABS Oral Take 200 mg by mouth daily.    . ASPIRIN 81 MG PO TABS Oral Take 1 tablet (81 mg total) by mouth daily. 30 tablet   . ATORVASTATIN CALCIUM 40 MG PO TABS Oral Take 40 mg by mouth daily.    [name] VITAMIN D-3 PO Oral Take 1,000 Units by mouth daily.     [name] FERROUS SULFATE 325 (65 FE) MG PO TABS Oral Take 325 mg by mouth daily with breakfast.     . OMEGA-3 FATTY ACIDS 1000 MG PO CAPS Oral Take 2 g by mouth daily.     [name] FLAX SEED OIL  PO Oral Take 1 capsule by mouth daily.     [name] FOLIC ACID 400 MCG PO TABS Oral Take 400 mcg by mouth daily.     . FUROSEMIDE 40 MG PO TABS Oral Take 1 tablet (40 mg total) by mouth 2 (two) times daily. HOLD today 9/6, restart in am on 9/7 60 tablet 11  . LISINOPRIL 5 MG PO TABS Oral Take 1 tablet (5 mg total) by mouth daily.    [name] METOPROLOL SUCCINATE ER 50 MG PO TB24 Oral Take 1 tablet (50 mg total) by mouth 2 (two) times daily. 180 tablet 3  . ADULT MULTIVITAMIN W/MINERALS CH Oral Take 1 tablet by mouth daily.    . ICAPS AREDS FORMULA PO Oral Take 1 capsule by mouth daily.    [name] POTASSIUM CHLORIDE CRYS ER 20 MEQ PO TBCR Oral Take 1 tablet (20 mEq total) by mouth daily. Take 1 whole tablet in AM and 1/2 tablet in PM 30 tablet 11  . SPIRONOLACTONE 25 MG PO TABS  1/2 tablet (total 12.5mg ) daily    . TAMSULOSIN HCL 0.4 MG PO CAPS Oral Take 0.4 mg by mouth daily.     . WARFARIN SODIUM 5 MG PO TABS  Take as directed by anticoagulation clinic 30 tablet 3    BP 79/60  Pulse 67  Temp 97.5 F (36.4 C) (Oral)  Resp 15  SpO2 100%  Physical Exam  Nursing note and vitals reviewed. Constitutional: He appears well-developed and well-nourished. No distress.  HENT:  Head: Normocephalic and atraumatic.  Eyes: Pupils are equal, round, and reactive to light. No scleral icterus.  Neck: Normal range of motion. Neck supple.  Cardiovascular: S1 normal, S2 normal and intact distal pulses.  An irregular rhythm present. Bradycardia present.  Exam reveals no decreased pulses.   Pulmonary/Chest: Effort normal. No respiratory distress. He has no wheezes.  Abdominal: Soft. He exhibits no distension. There is no tenderness.  Musculoskeletal: He exhibits no edema.  Neurological: He is alert.  Skin: Skin is warm. He is not diaphoretic.    ED Course  Procedures (including critical care time)   CRITICAL CARE Performed by: Lear Ng.   Total critical care time: 35 min  Critical care time was exclusive of  separately billable procedures and treating other patients.  Critical care was necessary to treat or prevent imminent or life-threatening deterioration.  Critical care was time spent personally by me on the following activities:  development of treatment plan with patient and/or surrogate as well as nursing, discussions with consultants, evaluation of patient's response to treatment, examination of patient, obtaining history from patient or surrogate, ordering and performing treatments and interventions, ordering and review of laboratory studies, ordering and review of radiographic studies, pulse oximetry and re-evaluation of patient's condition.  Labs Reviewed  CBC WITH DIFFERENTIAL - Abnormal; Notable for the following:    Hemoglobin 11.9 (*)     HCT 34.6 (*)     RDW 16.4 (*)     Platelets 146 (*)     All other components within normal limits  COMPREHENSIVE METABOLIC PANEL - Abnormal; Notable for the following:    Sodium 133 (*)     Potassium 5.8 (*)     CO2 16 (*)     BUN 86 (*)     Creatinine, Ser 5.20 (*)     GFR calc non Af Amer 9 (*)     GFR calc Af Amer 11 (*)     All other components within normal limits  PRO B NATRIURETIC PEPTIDE - Abnormal; Notable for the following:    Pro B Natriuretic peptide (BNP) 5468.0 (*)     All other components within normal limits  APTT - Abnormal; Notable for the following:    aPTT 72 (*)     All other components within normal limits  PROTIME-INR - Abnormal; Notable for the following:    Prothrombin Time 23.2 (*)     INR 2.16 (*)     All other components within normal limits  TROPONIN I  LACTIC ACID, PLASMA  URINALYSIS, ROUTINE W REFLEX MICROSCOPIC  URINALYSIS, MICROSCOPIC ONLY   Dg Chest Port 1 View  10/12/2012  *RADIOLOGY REPORT*  Clinical Data: Weakness  , dizziness  PORTABLE CHEST - 1 VIEW  Comparison:  04/15/2012  Findings: Cardiomediastinal silhouette is stable.  Status post median sternotomy.  No acute infiltrate or pleural effusion.   No pulmonary edema.  IMPRESSION: No active disease.   Original Report Authenticated By: Natasha Mead, M.D.      1. Bradycardia   2. Hypotension   3. Renal failure    I reviewed the above PCXR myself.  No edema or infiltrates.    ra sat is 100% and in interpret to be normal.  ECG at time 12:03 shows wide QRS bradycardia, irregular at rate 43, likely atrial fib or junctional escape rhythm, RBBB, normal axis, no ST or T wave abn.      ECG at time 12:52 shows NSR at rate 64, RBBB, normal axis, no ST or T wave abn's.     12:57 PM After glucagon begun and atropine given, HR is now sinus at rate 68, BP is improved to 100/50 laying down.  Pt was profoundly orthostatic upon arrival.      1:22 PM BNP is up, however with new worse renal insuff, may be due to lasix, o dehydration.  Will admit to medicine.  K+ is up at 5.8.  Will give some kayexalate as well as continue to give boluses of IVF to help bring K+ down and hopefully Cr down as well.     1:44 PM Spoke to [facility] who accepts in transfer to [facility] step down    3:39 PM BP is much improved after continued IVF's.   MDM  Pt is bradycardic, variable from 37-48.  Likely slow atrial fib versus AV block.  Glucagon for possible beta blocker overdose as well as atropine for bradcardia.  I  considered pacing, but normal mentation and no CP or SOB, or rales on exam.  Will need admission I think to [facility].          Gavin Pound. Oletta Lamas, MD 10/12/12 1344  Gavin Pound. Lavilla Delamora, MD 10/12/12 1539

## 2012-10-12 NOTE — ED Notes (Signed)
Pt presents to ED today with dizziness and decreased BP since yesterday.

## 2012-10-12 NOTE — ED Notes (Signed)
Pt presents to ED today with dizziness and SHOB since yesterday.

## 2012-10-12 NOTE — Consult Note (Signed)
Cardiology IP Consult  Reason for Consult:hypotension Referring Physician: Arthor Captain  HPI: [name] [name] is a 78 y.o.male with CAD s/p CABG, ischemic cardiomyopathy, severe MR and paroxysmal atrial fibrillation who was accepted in transfer by [facility] from [facility] for hypotension, bradycardia, and near syncope.  The patient reports feeling rather run down for the last few weeks.  He has lost 10 lbs and has had increased urination up until the last 3 days when his urine output decreased.  He began feeling dizzy over the last 48 hrs and almost passed out this morning.  He checked his BP and found it to be 84/40 and HR was 40.  Upon arrival to ED he was bradycardic in the 30s and hypotensive in the 60s systolic.  He received IV fluids as well as glucogon.  He has not had chest pain or SOB.  He feels that his urine output increased when he started aldactone 12.5 3 weeks ago.  He is normally NYHA Class II and walks up to 1.5 miles a few days a week.  He also normally does all his yardwork without problem.   Past Medical History  Diagnosis Date  . Coronary artery disease   . MI, old     AGE 44  . Ulcerative colitis   . Hyperlipidemia   . Hypertension   . Acute renal failure   . History of nephrolithiasis   . PAF (paroxysmal atrial fibrillation)   . LV dysfunction   . Myocardial infarction   . Anemia   . Blood transfusion   . Arthritis   . CHF (congestive heart failure)     Past Surgical History  Procedure Date  . Cardiac catheterization 10/14/2010    EF 30%  . Coronary artery bypass graft 10/24/2010    LIMA TO THE LAD, A SAPHENOUS VEIN GRAFT TO THE DIAGONAL 1 AND 3, AND SAPHENOUS VEIN GRAFT TO THE DISTAL RIGHT CORONARY ARTERY  . Ileostomy   . Colectomy   . Cataract extraction   . Transthoracic echocardiogram 01/16/2011    EF 50%  . Cardiovascular stress test 10/07/2010    EF 33%  . Appendectomy   . Cystostomy 04/22/2012    Procedure: CYSTOSTOMY SUPRAPUBIC;  Surgeon: Garnett Farm, MD;  Location: [facility] ORS;  Service: Urology;  Laterality: N/A;  CYSTOTOMY WITH MIDLINE INCISION REMOVAL OF STONE  . Tee without cardioversion 08/28/2012    Procedure: TRANSESOPHAGEAL ECHOCARDIOGRAM (TEE);  Surgeon: [name], MD;  Location: [facility] ENDOSCOPY;  Service: Cardiovascular;  Laterality: N/A;  . Cardioversion 08/28/2012    Procedure: CARDIOVERSION;  Surgeon: [name], MD;  Location: [facility] ENDOSCOPY;  Service: Cardiovascular;  Laterality: N/A;    Family History  Problem Relation Age of Onset  . Cancer Mother     colon  . Heart attack Father   . Hypertension Father   . Heart disease Sister   . Hypertension Sister   . Heart disease Brother   . Hypertension Brother   . Heart disease Sister   . Hypertension Sister   . Heart disease Sister   . Hypertension Sister   . Heart disease Brother   . Hypertension Brother   . Heart disease Brother   . Hypertension Brother   . Heart disease Brother   . Hypertension Brother   . Heart disease Brother   . Hypertension Brother   . Heart disease Brother   . Hypertension Brother   . Heart disease Brother   . Hypertension Brother   .  Heart disease Brother   . Hypertension Brother     Social History:  reports that he quit smoking about 43 years ago. His smoking use included Cigarettes and Cigars. He has a 15 pack-year smoking history. He does not have any smokeless tobacco history on file. He reports that he does not drink alcohol or use illicit drugs.  Allergies: No Known Allergies  Current Facility-Administered Medications  Medication Dose Route Frequency Provider Last Rate Last Dose  . 0.9 %  sodium chloride infusion   Intravenous Continuous Clydia Llano, MD 75 mL/hr at 10/12/12 1848    . acetaminophen (TYLENOL) tablet 650 mg  650 mg Oral Q6H PRN Clydia Llano, MD       Or  . acetaminophen (TYLENOL) suppository 650 mg  650 mg Rectal Q6H PRN Clydia Llano, MD      . amiodarone (PACERONE) tablet 200 mg  200 mg Oral Daily Clydia Llano, MD      . antiseptic oral rinse (BIOTENE) solution 15 mL  15 mL Mouth Rinse BID Clydia Llano, MD      . aspirin chewable tablet 81 mg  81 mg Oral Daily Clydia Llano, MD      . atorvastatin (LIPITOR) tablet 40 mg  40 mg Oral Daily Mutaz Elmahi, MD      . atropine injection 0.5 mg  0.5 mg Intravenous Once Gavin Pound. Ghim, MD   0.5 mg at 10/12/12 1237  . ferrous sulfate tablet 325 mg  325 mg Oral Q breakfast Clydia Llano, MD      . folic acid (FOLVITE) tablet 0.5 mg  500 mcg Oral Daily Clydia Llano, MD      . glucagon (GLUCAGEN) 5 mg in dextrose 5 % 50 mL ivpb  5 mg Intravenous Once Gavin Pound. Ghim, MD   5 mg at 10/12/12 1238  . multivitamin with minerals tablet 1 tablet  1 tablet Oral Daily Clydia Llano, MD      . omega-3 acid ethyl esters (LOVAZA) capsule 2 g  2 g Oral Daily Clydia Llano, MD      . ondansetron (ZOFRAN) injection 4 mg  4 mg Intravenous Once Gavin Pound. Ghim, MD   4 mg at 10/12/12 1300  . ondansetron (ZOFRAN) tablet 4 mg  4 mg Oral Q6H PRN Clydia Llano, MD       Or  . ondansetron (ZOFRAN) injection 4 mg  4 mg Intravenous Q6H PRN Clydia Llano, MD      . pneumococcal 23 valent vaccine (PNU-IMMUNE) injection 0.5 mL  0.5 mL Intramuscular Tomorrow-1000 Mutaz Elmahi, MD      . sodium chloride 0.9 % bolus 250 mL  250 mL Intravenous Once Gavin Pound. Ghim, MD   250 mL at 10/12/12 1426  . sodium chloride 0.9 % bolus 500 mL  500 mL Intravenous Once Gavin Pound. Ghim, MD   500 mL at 10/12/12 1237  . sodium chloride 0.9 % bolus 500 mL  500 mL Intravenous Once Gavin Pound. Ghim, MD   500 mL at 10/12/12 1426  . sodium chloride 0.9 % injection 3 mL  3 mL Intravenous Q12H Mutaz Elmahi, MD      . sodium polystyrene (KAYEXALATE) 15 GM/60ML suspension 15 g  15 g Oral Once Gavin Pound. Ghim, MD   15 g at 10/12/12 1518  . Tamsulosin HCl (FLOMAX) capsule 0.4 mg  0.4 mg Oral Daily Clydia Llano, MD      . warfarin (COUMADIN) tablet 2.5 mg  2.5 mg Oral Q T,Th,Sat-1800  Clydia Llano, MD      . warfarin  (COUMADIN) tablet 5 mg  5 mg Oral Q M,W,F,Su-1800 Clydia Llano, MD      . Warfarin - Pharmacist Dosing Inpatient   Does not apply L2440 Clydia Llano, MD      . DISCONTD: 0.9 %  sodium chloride infusion   Intravenous STAT Gavin Pound. Ghim, MD      . DISCONTD: aspirin tablet 81 mg  81 mg Oral Daily Clydia Llano, MD      . DISCONTD: fish oil-omega-3 fatty acids capsule 2 g  2 g Oral Daily Clydia Llano, MD      . DISCONTD: warfarin (COUMADIN) tablet 5 mg  5 mg Oral q1800 Clydia Llano, MD        ROS: A full review of systems is obtained and is negative except as noted in the HPI.  Physical Exam: Blood pressure 112/47, pulse 56, temperature 98.3 F (36.8 C), temperature source Oral, resp. rate 16, height 5\' 4"  (1.626 m), weight 66.044 kg (145 lb 9.6 oz), SpO2 99.00%.  GENERAL: no acute distress.  EYES: Extra ocular movements are intact. There is no lid lag. Sclera is anicteric.  ENT: Oropharynx is clear. Dentition is within normal limits.  NECK: Supple. The thyroid is not enlarged.  LYMPH: There are no masses or lymphadenopathy present.  HEART: Regular rate and rhythm with 2/6 holosystolic murmur anteriorly directed, no JVD LUNGS: Clear to auscultation There are no rales, rhonchi, or wheezes.  ABDOMEN: Soft, non-tender, and non-distended with normoactive bowel sounds. There is no hepatosplenomegaly.  EXTREMITIES: No clubbing, cyanosis, or edema.  PULSES: DP/PT pulses were +1 and equal bilaterally.  SKIN: Warm, dry, and intact.  NEUROLOGIC: The patient was oriented to person, place, and time. No overt neurologic deficits were detected.  PSYCH: Normal judgment and insight, mood is appropriate.    Results: Results for orders placed during the hospital encounter of 10/12/12 (from the past 24 hour(s))  CBC WITH DIFFERENTIAL     Status: Abnormal   Collection Time   10/12/12 12:12 PM      Component Value Range   WBC 8.4  4.0 - 10.5 K/uL   RBC 4.28  4.22 - 5.81 MIL/uL   Hemoglobin 11.9 (*) 13.0  - 17.0 g/dL   HCT 10.2 (*) 72.5 - 36.6 %   MCV 80.8  78.0 - 100.0 fL   MCH 27.8  26.0 - 34.0 pg   MCHC 34.4  30.0 - 36.0 g/dL   RDW 44.0 (*) 34.7 - 42.5 %   Platelets 146 (*) 150 - 400 K/uL   Neutrophils Relative 76  43 - 77 %   Neutro Abs 6.4  1.7 - 7.7 K/uL   Lymphocytes Relative 14  12 - 46 %   Lymphs Abs 1.2  0.7 - 4.0 K/uL   Monocytes Relative 9  3 - 12 %   Monocytes Absolute 0.7  0.1 - 1.0 K/uL   Eosinophils Relative 1  0 - 5 %   Eosinophils Absolute 0.1  0.0 - 0.7 K/uL   Basophils Relative 0  0 - 1 %   Basophils Absolute 0.0  0.0 - 0.1 K/uL  COMPREHENSIVE METABOLIC PANEL     Status: Abnormal   Collection Time   10/12/12 12:12 PM      Component Value Range   Sodium 133 (*) 135 - 145 mEq/L   Potassium 5.8 (*) 3.5 - 5.1 mEq/L   Chloride 99  96 - 112 mEq/L  CO2 16 (*) 19 - 32 mEq/L   Glucose, Bld 87  70 - 99 mg/dL   BUN 86 (*) 6 - 23 mg/dL   Creatinine, Ser 6.21 (*) 0.50 - 1.35 mg/dL   Calcium 30.8  8.4 - 65.7 mg/dL   Total Protein 8.2  6.0 - 8.3 g/dL   Albumin 3.6  3.5 - 5.2 g/dL   AST 35  0 - 37 U/L   ALT 28  0 - 53 U/L   Alkaline Phosphatase 82  39 - 117 U/L   Total Bilirubin 0.4  0.3 - 1.2 mg/dL   GFR calc non Af Amer 9 (*) >90 mL/min   GFR calc Af Amer 11 (*) >90 mL/min  TROPONIN I     Status: Normal   Collection Time   10/12/12 12:12 PM      Component Value Range   Troponin I <0.30  <0.30 ng/mL  PRO B NATRIURETIC PEPTIDE     Status: Abnormal   Collection Time   10/12/12 12:12 PM      Component Value Range   Pro B Natriuretic peptide (BNP) 5468.0 (*) 0 - 450 pg/mL  LACTIC ACID, PLASMA     Status: Normal   Collection Time   10/12/12 12:12 PM      Component Value Range   Lactic Acid, Venous 1.3  0.5 - 2.2 mmol/L  APTT     Status: Abnormal   Collection Time   10/12/12 12:12 PM      Component Value Range   aPTT 72 (*) 24 - 37 seconds  PROTIME-INR     Status: Abnormal   Collection Time   10/12/12 12:12 PM      Component Value Range   Prothrombin Time  23.2 (*) 11.6 - 15.2 seconds   INR 2.16 (*) 0.00 - 1.49  URINALYSIS, MICROSCOPIC ONLY     Status: Abnormal   Collection Time   10/12/12  2:30 PM      Component Value Range   Color, Urine YELLOW  YELLOW   APPearance CLOUDY (*) CLEAR   Specific Gravity, Urine 1.014  1.005 - 1.030   pH 5.0  5.0 - 8.0   Glucose, UA NEGATIVE  NEGATIVE mg/dL   Hgb urine dipstick MODERATE (*) NEGATIVE   Bilirubin Urine NEGATIVE  NEGATIVE   Ketones, ur NEGATIVE  NEGATIVE mg/dL   Protein, ur NEGATIVE  NEGATIVE mg/dL   Urobilinogen, UA 0.2  0.0 - 1.0 mg/dL   Nitrite NEGATIVE  NEGATIVE   Leukocytes, UA SMALL (*) NEGATIVE   WBC, UA 3-6  <3 WBC/hpf   RBC / HPF 7-10  <3 RBC/hpf   Bacteria, UA MANY (*) RARE   Squamous Epithelial / LPF RARE  RARE  MRSA PCR SCREENING     Status: Normal   Collection Time   10/12/12  5:23 PM      Component Value Range   MRSA by PCR NEGATIVE  NEGATIVE    CXR: Clear EKG: sinus brady with RBBB and evidence of prior Anterior MI and inferior MI, similar to prior  Assessment/Plan: 78 yo WM with CAD s/p CABG, ischemic CM, severe MR, and paroxysmal afib here with hypotension due to hypovolemia and acute on chronic renal failure with hyperkalemia. 1. Hypovolemia: likely due to overdiuresis.  UOP increased after addition of aldactone - agree with holding all antihtn meds for now - agree with IV fluids 2. Bradycardia: current HR is acceptable - agree with holding toprol xl for now, may be able  to reinitiate in future, possibly at lower dose 3. Ischemic CM: currently volume depleted but otherwise well compensated - hope to restart acei and toprol at some dose in future - will have to adjust diuretic dose 4. PAF: currently in NSR - continue coumadin - continue amiodarone 5. ARF on CKD:  - expect to improve with volume repletion  [name] [name] 10/12/2012, 7:28 PM

## 2012-10-12 NOTE — Progress Notes (Signed)
ANTICOAGULATION CONSULT NOTE - Initial Consult  Pharmacy Consult for coumadin Indication: atrial fibrillation  No Known Allergies  Patient Measurements: Height: 5\' 4"  (162.6 cm) Weight: 145 lb 9.6 oz (66.044 kg) IBW/kg (Calculated) : 59.2   Vital Signs: Temp: 97.5 F (36.4 C) (10/19 1231) Temp src: Oral (10/19 1231) BP: 106/42 mmHg (10/19 1800) Pulse Rate: 56  (10/19 1800)  Labs:  Basename 10/12/12 1212  HGB 11.9*  HCT 34.6*  PLT 146*  APTT 72*  LABPROT 23.2*  INR 2.16*  HEPARINUNFRC --  CREATININE 5.20*  CKTOTAL --  CKMB --  TROPONINI <0.30    Estimated Creatinine Clearance: 9.6 ml/min (by C-G formula based on Cr of 5.2).   Medical History: Past Medical History  Diagnosis Date  . Coronary artery disease   . MI, old     AGE 78  . Ulcerative colitis   . Hyperlipidemia   . Hypertension   . Acute renal failure   . History of nephrolithiasis   . PAF (paroxysmal atrial fibrillation)   . LV dysfunction   . Myocardial infarction   . Anemia   . Blood transfusion   . Arthritis   . CHF (congestive heart failure)     Medications:  Scheduled:    . amiodarone  200 mg Oral Daily  . aspirin  81 mg Oral Daily  . atorvastatin  40 mg Oral Daily  . atropine  0.5 mg Intravenous Once  . ferrous sulfate  325 mg Oral Q breakfast  . folic acid  500 mcg Oral Daily  . glucagon (GLUCAGEN) 5 MG IV  5 mg Intravenous Once  . multivitamin with minerals  1 tablet Oral Daily  . omega-3 acid ethyl esters  2 g Oral Daily  . ondansetron (ZOFRAN) IV  4 mg Intravenous Once  . pneumococcal 23 valent vaccine  0.5 mL Intramuscular Tomorrow-1000  . sodium chloride  250 mL Intravenous Once  . sodium chloride  500 mL Intravenous Once  . sodium chloride  500 mL Intravenous Once  . sodium chloride  3 mL Intravenous Q12H  . sodium polystyrene  15 g Oral Once  . Tamsulosin HCl  0.4 mg Oral Daily  . warfarin  5 mg Oral q1800  . DISCONTD: sodium chloride   Intravenous STAT  . DISCONTD:  aspirin  81 mg Oral Daily  . DISCONTD: fish oil-omega-3 fatty acids  2 g Oral Daily    Assessment: 78 yo with a hx afib who was admitted for hypotension and bradycardia. His INR is therapeutic today. Coumadin to be restarted here.   PTA: 5mg  qday except 2.5mg  TTSat  Goal of Therapy:  INR 2-3 Monitor platelets by anticoagulation protocol: Yes   Plan:   Cont coumadin 5mg  qday except 2.5mg  TTSat Daily INR  Ulyses Southward [facility] 10/12/2012,6:24 PM

## 2012-10-12 NOTE — Code Documentation (Signed)
Family updated as to patient's status via telephone by pt

## 2012-10-12 NOTE — ED Notes (Signed)
Pt had recent cardioversion within the last 6 weeks.

## 2012-10-13 DIAGNOSIS — I498 Other specified cardiac arrhythmias: Principal | ICD-10-CM

## 2012-10-13 DIAGNOSIS — I4891 Unspecified atrial fibrillation: Secondary | ICD-10-CM

## 2012-10-13 DIAGNOSIS — I509 Heart failure, unspecified: Secondary | ICD-10-CM

## 2012-10-13 DIAGNOSIS — I5022 Chronic systolic (congestive) heart failure: Secondary | ICD-10-CM

## 2012-10-13 DIAGNOSIS — N189 Chronic kidney disease, unspecified: Secondary | ICD-10-CM

## 2012-10-13 DIAGNOSIS — N179 Acute kidney failure, unspecified: Secondary | ICD-10-CM

## 2012-10-13 DIAGNOSIS — I959 Hypotension, unspecified: Secondary | ICD-10-CM

## 2012-10-13 LAB — BASIC METABOLIC PANEL
BUN: 73 mg/dL — ABNORMAL HIGH (ref 6–23)
Calcium: 9.2 mg/dL (ref 8.4–10.5)
Creatinine, Ser: 3.94 mg/dL — ABNORMAL HIGH (ref 0.50–1.35)
GFR calc Af Amer: 15 mL/min — ABNORMAL LOW (ref >90)
GFR calc non Af Amer: 13 mL/min — ABNORMAL LOW (ref >90)

## 2012-10-13 LAB — URINALYSIS, ROUTINE W REFLEX MICROSCOPIC
Bilirubin Urine: NEGATIVE
Ketones, ur: NEGATIVE mg/dL
Nitrite: NEGATIVE
Protein, ur: NEGATIVE mg/dL
Urobilinogen, UA: 0.2 mg/dL (ref 0.0–1.0)

## 2012-10-13 LAB — URINE MICROSCOPIC-ADD ON

## 2012-10-13 LAB — TSH: TSH: 0.826 u[IU]/mL (ref 0.350–4.500)

## 2012-10-13 LAB — CBC
HCT: 31.6 % — ABNORMAL LOW (ref 39.0–52.0)
MCHC: 33.5 g/dL (ref 30.0–36.0)
RDW: 16.5 % — ABNORMAL HIGH (ref 11.5–15.5)

## 2012-10-13 LAB — PROTIME-INR: INR: 2.67 — ABNORMAL HIGH (ref 0.00–1.49)

## 2012-10-13 MED ORDER — WARFARIN SODIUM 5 MG PO TABS
5.0000 mg | ORAL_TABLET | ORAL | Status: DC
  Administered 2012-10-13: 5 mg via ORAL
  Filled 2012-10-13 (×2): qty 1

## 2012-10-13 NOTE — Progress Notes (Signed)
[facility] Progress Note [place] TEAM 1 - Stepdown/ICU TEAM   Vidit Boissonneault WGN:[id number] DOB: June 01, 1930 DOA: 10/12/2012 PCP: Seymour Bars, DO  Brief narrative: 78 y.o. 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 Patient presented to [facility] with dizziness. Patient reported to have dizziness about 3 days ago, but mainly happen when he gets up from lying down or sitting position. He never collapsed, denies any loss of consciousness, denied any shortness of breath, chest pain, palpitations or lower extremity swelling. He drove himself to an urgent care [facility] which they instructed him to go to [place] Upon initial evaluation over there he was found to have hypotension with his BP in the 60s and extreme bradycardia with heart rate in the 30s to 40s. He also had acute on chronic renal failure with creatinine of 5.2. Patient was given bolus of 500 mL of NS and his SBP improved to over 100. He was given glucagon and his heart rate went up to the 60s. He does take Lasix, lisinopril, spironolactone, Toprol-XL and amiodarone. Patient was sent to the hospital for further evaluation.  Assessment/Plan:  Hypotension likely multifactorial secondary to volume depletion and medications affect - improved after meds held + IVF challenge - appears to have simply been over-diuresed  bradycarida Pt chronically on amiodarone and toprol XL - Cards following  Acute on chronic renal failure CKD stage III, with baseline creatinine of 2.2 - chronically on Lasix - 6 weeks ago Aldactone was added - about a week ago lisinopril increased to 5 from 2.5 - crt improving w/ volume expansion  Mild metabolic acidosis Due to above - improving w/ volume resuscitation  Ischemic CHF - EF 35-40% (Sept 2013 TEE)  PAF s/p TEE [facility] Sept 2013 Is on  chronic coumadin tx - INR at goal  Normocytic anemia Likely due to renal disease - no evidence of blood loss - follow trend for now  HTN  Ulcerative colitis s/p illeostomy  CAD s/p CABG Oct 2011  Borderline UA Likely a consequence of DH - repeat if develops sx once hydrated  Complicated cyst within the inferior pole of the left kidney 4cm via [place] - Radiology suggests f/u w/ non-emergent CT or MRI - will consider prior to d/c (or could be accomplished as outpt)  Code Status: FULL Family Communication: spoke w/ pt at bedside Disposition Plan: Transfer to Cardiology Service  Consultants: [facility]  Procedures: None   Antibiotics: None  DVT prophylaxis: Cont coumadin   HPI/Subjective: Spoke w/ Dr. Johney Frame.  He has agreed to assume Attending role for this pleasant gentleman, who is followed by Dr. [name] at [facility].  [facility] will sign off.   Objective: Blood pressure 118/47, pulse 50, temperature 97.7 F (36.5 C), temperature source Oral, resp. rate 14, height 5\' 4"  (1.626 m), weight 66.044 kg (145 lb 9.6 oz), SpO2 100.00%.  Intake/Output Summary (Last 24 hours) at 10/13/12 0911 Last data filed at 10/13/12 0700  Gross per 24 hour  Intake 1270.42 ml  Output   3695 ml  Net -2424.58 ml     Exam: Exam completed - no charge for f/u visit today - [place] [facility] assuming care  Data Reviewed: Basic Metabolic Panel:  Lab 10/13/12 7846 10/12/12 1212  NA 138 133*  K 5.6* 5.8*  CL 110 99  CO2 18* 16*  GLUCOSE  65* 87  BUN 73* 86*  CREATININE 3.94* 5.20*  CALCIUM 9.2 10.0  MG -- --  PHOS -- --   Liver Function Tests:  Lab 10/12/12 1212  AST 35  ALT 28  ALKPHOS 82  BILITOT 0.4  PROT 8.2  ALBUMIN 3.6   CBC:  Lab 10/13/12 0555 10/12/12 1212  WBC 7.1 8.4  NEUTROABS -- 6.4  HGB 10.6* 11.9*  HCT 31.6* 34.6*  MCV 82.1 80.8  PLT 127* 146*   Cardiac Enzymes:  Lab 10/12/12 1212  CKTOTAL --  CKMB --  CKMBINDEX --  TROPONINI <0.30   BNP (last 3  results)  Basename 10/12/12 1212 10/03/12 0903 09/13/12 0911  PROBNP 5468.0* 187.0* 971.0*    Recent Results (from the past 240 hour(s))  MRSA PCR SCREENING     Status: Normal   Collection Time   10/12/12  5:23 PM      Component Value Range Status Comment   MRSA by PCR NEGATIVE  NEGATIVE Final      Studies:  Recent x-ray studies have been reviewed in detail by the Attending Physician  Scheduled Meds:  Reviewed in detail by the Attending Physician   Lonia Blood, MD [facility] Office  [phone] Pager [phone]  On-Call/Text Page:      [email or web address]      password [email or web address]  If 7PM-7AM, please contact night-coverage [email or web address] Password [email or web address] 10/13/2012, 9:11 AM   LOS: 1 day

## 2012-10-13 NOTE — Progress Notes (Signed)
Pharmacist Heart Failure Core Measure Documentation  Assessment: [name] [name] has an EF documented as 35-40% on 09/06/12 by ECHO.  Rationale: Heart failure patients with left ventricular systolic dysfunction (LVSD) and an EF < 40% should be prescribed an angiotensin converting enzyme inhibitor (ACEI) or angiotensin receptor blocker (ARB) at discharge unless a contraindication is documented in the medical record.  This patient is not currently on an ACEI or ARB for HF.  This note is being placed in the record in order to provide documentation that a contraindication to the use of these agents is present for this encounter.  ACE Inhibitor or Angiotensin Receptor Blocker is contraindicated (specify all that apply)  []   ACEI allergy AND ARB allergy []   Angioedema []   Moderate or severe aortic stenosis []   Hyperkalemia [x]   Hypotension []   Renal artery stenosis [x]   Worsening renal function, preexisting renal disease or dysfunction   Woodfin Ganja 10/13/2012 2:42 PM

## 2012-10-13 NOTE — Progress Notes (Signed)
SUBJECTIVE: The patient is doing well today.  His postural dizziness is much improved.  At this time, he denies chest pain, shortness of breath, or any new concerns.     [name] amiodarone  200 mg Oral Daily  . antiseptic oral rinse  15 mL Mouth Rinse BID  . aspirin  81 mg Oral Daily  . atorvastatin  40 mg Oral Daily  . atropine  0.5 mg Intravenous Once  . ferrous sulfate  325 mg Oral Q breakfast  . folic acid  500 mcg Oral Daily  . glucagon (GLUCAGEN) 5 MG IV  5 mg Intravenous Once  . multivitamin with minerals  1 tablet Oral Daily  . omega-3 acid ethyl esters  2 g Oral Daily  . ondansetron (ZOFRAN) IV  4 mg Intravenous Once  . pneumococcal 23 valent vaccine  0.5 mL Intramuscular Tomorrow-1000  . sodium chloride  250 mL Intravenous Once  . sodium chloride  500 mL Intravenous Once  . sodium chloride  500 mL Intravenous Once  . sodium chloride  3 mL Intravenous Q12H  . sodium polystyrene  15 g Oral Once  . Tamsulosin HCl  0.4 mg Oral Daily  . warfarin  2.5 mg Oral Q T,Th,Sat-1800  . warfarin  5 mg Oral Q M,W,F,Su-1800  . Warfarin - Pharmacist Dosing Inpatient   Does not apply q1800  . DISCONTD: sodium chloride   Intravenous STAT  . DISCONTD: aspirin  81 mg Oral Daily  . DISCONTD: fish oil-omega-3 fatty acids  2 g Oral Daily  . DISCONTD: warfarin  5 mg Oral q1800      . sodium chloride 75 mL/hr at 10/13/12 0700    OBJECTIVE: Physical Exam: Filed Vitals:   10/13/12 0600 10/13/12 0700 10/13/12 0757 10/13/12 0800  BP: 111/50 118/42  118/47  Pulse: 50 46  50  Temp:   97.7 F (36.5 C)   TempSrc:   Oral   Resp: 15 13  14   Height:      Weight:      SpO2: 99% 100%  100%    Intake/Output Summary (Last 24 hours) at 10/13/12 1105 Last data filed at 10/13/12 0900  Gross per 24 hour  Intake 1630.42 ml  Output   3770 ml  Net -2139.58 ml    Telemetry reveals sinus rhythm improved to 60s this am  GEN- The patient is well appearing, alert and oriented x 3 today.   Head-  normocephalic, atraumatic Eyes-  Sclera clear, conjunctiva pink Ears- hearing intact Oropharynx- clear with dry MM Neck- supple, no JVP Lungs- Clear to ausculation bilaterally, normal work of breathing Heart- Regular rate and rhythm,decreased HS GI- soft, NT, ND, + BS Extremities- no clubbing, cyanosis, or edema Skin- no rash or lesion Psych- euthymic mood, full affect Neuro- strength and sensation are intact  LABS: Basic Metabolic Panel:  Basename 10/13/12 0555 10/12/12 1212  NA 138 133*  K 5.6* 5.8*  CL 110 99  CO2 18* 16*  GLUCOSE 65* 87  BUN 73* 86*  CREATININE 3.94* 5.20*  CALCIUM 9.2 10.0  MG -- --  PHOS -- --   Liver Function Tests:  [facility] 10/12/12 1212  AST 35  ALT 28  ALKPHOS 82  BILITOT 0.4  PROT 8.2  ALBUMIN 3.6   No results found for this basename: LIPASE:2,AMYLASE:2 in the last 72 hours CBC:  Basename 10/13/12 0555 10/12/12 1212  WBC 7.1 8.4  NEUTROABS -- 6.4  HGB 10.6* 11.9*  HCT 31.6* 34.6*  MCV  82.1 80.8  PLT 127* 146*   Cardiac Enzymes:  Basename 10/12/12 1212  CKTOTAL --  CKMB --  CKMBINDEX --  TROPONINI <0.30   RADIOLOGY: US Renal  10/12/2012  *RADIOLOGY REPORT*  Clinical Data:  Renal failure.  History of kidney stones.  RENAL/URINARY TRACT ULTRASOUND COMPLETE  Comparison:  None.  Findings:  Right Kidney:  Measures 10.7 cm.   Size and parenchymal echogenicity.  Multiple cyst noted within the upper pole the right kidney.  The largest measures 4.1 x 3.9 x 3.5 cm. Stone is identified within the inferior pole collecting system of the right kidney measuring 1.3 cm.  No evidence of mass or hydronephrosis.  Left Kidney:  Measures 12 cm.  Normal in size and parenchymal echogenicity.  No evidence of mass or hydronephrosis. Multiple cysts are identified.  Largest cyst is in the inferior pole and contains an eccentric area of mural calcification or echogenic nodule.  This measures 4.1 x 4.4 x 4.6 cm  Bladder:  Collapsed around a Foley catheter.   IMPRESSION:  1.  Bilateral renal cysts.  A complicated cyst within the inferior pole of the left kidney measures 4 cm.  Advise follow-up imaging with non emergent CT or MRI. 2.  No obstructive uropathy. 3.  Nonobstructing right renal stone.   Original Report Authenticated By: Rosealee Albee, M.D.    Dg Chest Port 1 View  10/12/2012  *RADIOLOGY REPORT*  Clinical Data: Weakness  , dizziness  PORTABLE CHEST - 1 VIEW  Comparison:  04/15/2012  Findings: Cardiomediastinal silhouette is stable.  Status post median sternotomy.  No acute infiltrate or pleural effusion.  No pulmonary edema.  IMPRESSION: No active disease.   Original Report Authenticated By: Natasha Mead, M.D.     ASSESSMENT AND PLAN:  Active Problems:  ULCERATIVE COLITIS, HX OF  Mitral regurgitation  Chronic systolic CHF (congestive heart failure)  CKD (chronic kidney disease)  Acute on chronic renal failure  Hypotension  Bradycardia  Paroxysmal atrial fibrillation  Assessment/Plan:  78 yo WM with CAD s/p CABG, ischemic CM, severe MR, and paroxysmal afib here with hypotension due to hypovolemia and acute on chronic renal failure with hyperkalemia.   1. Hypovolemia:  Improved with holding diuretics Will stop IV fluid in am if creatinine closer to baseline and encourage PO hydration at that point 2. Bradycardia: current HR is improved  - agree with holding toprol xl for now, may be able to reinitiate in future, possibly at lower dose  Given advanced age, I would not favor device implantation if this could be avoided 3. Ischemic CM: currently volume depleted but otherwise well compensated  - hope to restart acei and toprol at some dose in future  - will have to adjust diuretic dose  4. PAF: currently in NSR  - continue coumadin  - continue amiodarone  5. ARF on CKD:  - expect to improve with volume repletion 6. Hyperkalemia IVF and close follow-up Spironolactone/ ace inhibitor are on hold  Transfer to cardiology  service Transfer to telemetry   Hillis Range, MD 10/13/2012 11:05 AM

## 2012-10-13 NOTE — Progress Notes (Signed)
ANTICOAGULATION CONSULT NOTE - Follow-up Consult  Pharmacy Consult for coumadin Indication: atrial fibrillation  No Known Allergies  Patient Measurements: Height: 5\' 4"  (162.6 cm) Weight: 145 lb 9.6 oz (66.044 kg) IBW/kg (Calculated) : 59.2   Vital Signs: Temp: 98 F (36.7 C) (10/20 1147) Temp src: Oral (10/20 0757) BP: 118/47 mmHg (10/20 0800) Pulse Rate: 50  (10/20 0800)  Labs:  Basename 10/13/12 0555 10/12/12 1212  HGB 10.6* 11.9*  HCT 31.6* 34.6*  PLT 127* 146*  APTT -- 72*  LABPROT 27.1* 23.2*  INR 2.67* 2.16*  HEPARINUNFRC -- --  CREATININE 3.94* 5.20*  CKTOTAL -- --  CKMB -- --  TROPONINI -- <0.30    Estimated Creatinine Clearance: 12.7 ml/min (by C-G formula based on Cr of 3.94).   Medical History: Past Medical History  Diagnosis Date  . Coronary artery disease   . MI, old     AGE 48  . Ulcerative colitis   . Hyperlipidemia   . Hypertension   . Acute renal failure   . History of nephrolithiasis   . PAF (paroxysmal atrial fibrillation)   . LV dysfunction   . Myocardial infarction   . Anemia   . Blood transfusion   . Arthritis   . CHF (congestive heart failure)     Medications:  Scheduled:     . amiodarone  200 mg Oral Daily  . antiseptic oral rinse  15 mL Mouth Rinse BID  . aspirin  81 mg Oral Daily  . atorvastatin  40 mg Oral Daily  . ferrous sulfate  325 mg Oral Q breakfast  . folic acid  500 mcg Oral Daily  . omega-3 acid ethyl esters  2 g Oral Daily  . pneumococcal 23 valent vaccine  0.5 mL Intramuscular Tomorrow-1000  . sodium chloride  250 mL Intravenous Once  . sodium chloride  500 mL Intravenous Once  . sodium chloride  500 mL Intravenous Once  . sodium chloride  3 mL Intravenous Q12H  . sodium polystyrene  15 g Oral Once  . Tamsulosin HCl  0.4 mg Oral Daily  . warfarin  2.5 mg Oral Q T,Th,Sat-1800  . warfarin  5 mg Oral Q M,W,F,Su-1800  . Warfarin - Pharmacist Dosing Inpatient   Does not apply q1800  . DISCONTD: sodium  chloride   Intravenous STAT  . DISCONTD: aspirin  81 mg Oral Daily  . DISCONTD: fish oil-omega-3 fatty acids  2 g Oral Daily  . DISCONTD: multivitamin with minerals  1 tablet Oral Daily  . DISCONTD: warfarin  5 mg Oral q1800    Assessment: 78 yo with a hx afib who was admitted for hypotension and bradycardia. Today, INR is therpaeutic on home dose.  No complications noted.  PTA: 5mg  qday except 2.5mg  TTSat  Goal of Therapy:  INR 2-3 Monitor platelets by anticoagulation protocol: Yes   Plan:  Cont Coumadin 5mg  qday except 2.5mg  TTSat Daily INR  Lulamae Skorupski L. Illene Bolus, PharmD, BCPS Clinical Pharmacist Pager: [phone] Pharmacy: [phone] 10/13/2012 1:57 PM

## 2012-10-14 ENCOUNTER — Ambulatory Visit: Payer: Medicare Other | Admitting: Family Medicine

## 2012-10-14 DIAGNOSIS — I5023 Acute on chronic systolic (congestive) heart failure: Secondary | ICD-10-CM

## 2012-10-14 LAB — BASIC METABOLIC PANEL
BUN: 56 mg/dL — ABNORMAL HIGH (ref 6–23)
CO2: 18 mEq/L — ABNORMAL LOW (ref 19–32)
Chloride: 110 mEq/L (ref 96–112)
Creatinine, Ser: 2.62 mg/dL — ABNORMAL HIGH (ref 0.50–1.35)

## 2012-10-14 MED ORDER — CIPROFLOXACIN HCL 250 MG PO TABS
250.0000 mg | ORAL_TABLET | Freq: Two times a day (BID) | ORAL | Status: DC
  Filled 2012-10-14 (×3): qty 1

## 2012-10-14 MED ORDER — CARVEDILOL 3.125 MG PO TABS
3.1250 mg | ORAL_TABLET | Freq: Two times a day (BID) | ORAL | Status: DC
  Administered 2012-10-14 – 2012-10-15 (×2): 3.125 mg via ORAL
  Filled 2012-10-14 (×4): qty 1

## 2012-10-14 MED ORDER — WARFARIN SODIUM 2.5 MG PO TABS
2.5000 mg | ORAL_TABLET | Freq: Once | ORAL | Status: AC
  Administered 2012-10-14: 2.5 mg via ORAL
  Filled 2012-10-14: qty 1

## 2012-10-14 NOTE — Progress Notes (Signed)
Patient ID: [name] [name], male   DOB: 09-Apr-1930, 78 y.o.   MRN: [id number]    SUBJECTIVE: Feels much better.  No dyspnea.  Still getting IV fluid, creatinine improving.      [name] amiodarone  200 mg Oral Daily  . antiseptic oral rinse  15 mL Mouth Rinse BID  . aspirin  81 mg Oral Daily  . atorvastatin  40 mg Oral Daily  . carvedilol  3.125 mg Oral BID WC  . ferrous sulfate  325 mg Oral Q breakfast  . folic acid  500 mcg Oral Daily  . omega-3 acid ethyl esters  2 g Oral Daily  . pneumococcal 23 valent vaccine  0.5 mL Intramuscular Tomorrow-1000  . sodium chloride  3 mL Intravenous Q12H  . Tamsulosin HCl  0.4 mg Oral Daily  . warfarin  2.5 mg Oral Q T,Th,Sat-1800  . warfarin  5 mg Oral Q M,W,F,Su-1800  . Warfarin - Pharmacist Dosing Inpatient   Does not apply q1800  . DISCONTD: ciprofloxacin  250 mg Oral BID  . DISCONTD: multivitamin with minerals  1 tablet Oral Daily  . DISCONTD: warfarin  5 mg Oral Q M,W,F,Su-1800  NS @ 75 cc/hr    Filed Vitals:   10/13/12 1200 10/13/12 1431 10/13/12 2154 10/14/12 0555  BP: 100/48 106/50 142/62 119/57  Pulse:  63 66 59  Temp:  97.7 F (36.5 C) 98.5 F (36.9 C) 98.4 F (36.9 C)  TempSrc:  Oral Oral Oral  Resp: 16 16 15 14   Height:  5\' 4"  (1.626 m)    Weight:  146 lb 12.8 oz (66.588 kg)  147 lb 6.4 oz (66.86 kg)  SpO2: 98% 90% 100% 100%    Intake/Output Summary (Last 24 hours) at 10/14/12 0849 Last data filed at 10/14/12 0825  Gross per 24 hour  Intake 3277.5 ml  Output    775 ml  Net 2502.5 ml    LABS: Basic Metabolic Panel:  Basename 10/14/12 0548 10/13/12 0555  NA 137 138  K 4.8 5.6*  CL 110 110  CO2 18* 18*  GLUCOSE 77 65*  BUN 56* 73*  CREATININE 2.62* 3.94*  CALCIUM 8.8 9.2  MG -- --  PHOS -- --   Liver Function Tests:  [facility] 10/12/12 1212  AST 35  ALT 28  ALKPHOS 82  BILITOT 0.4  PROT 8.2  ALBUMIN 3.6   No results found for this basename: LIPASE:2,AMYLASE:2 in the last 72 hours CBC:  Basename  10/13/12 0555 10/12/12 1212  WBC 7.1 8.4  NEUTROABS -- 6.4  HGB 10.6* 11.9*  HCT 31.6* 34.6*  MCV 82.1 80.8  PLT 127* 146*   Cardiac Enzymes:  Basename 10/12/12 1212  CKTOTAL --  CKMB --  CKMBINDEX --  TROPONINI <0.30   BNP: No components found with this basename: POCBNP:3 D-Dimer: No results found for this basename: DDIMER:2 in the last 72 hours Hemoglobin A1C: No results found for this basename: HGBA1C in the last 72 hours Fasting Lipid Panel: No results found for this basename: CHOL,HDL,LDLCALC,TRIG,CHOLHDL,LDLDIRECT in the last 72 hours Thyroid Function Tests:  Basename 10/13/12 0555  TSH 0.826  T4TOTAL --  T3FREE --  THYROIDAB --   Anemia Panel: No results found for this basename: VITAMINB12,FOLATE,FERRITIN,TIBC,IRON,RETICCTPCT in the last 72 hours  RADIOLOGY: US Renal  10/12/2012  *RADIOLOGY REPORT*  Clinical Data:  Renal failure.  History of kidney stones.  RENAL/URINARY TRACT ULTRASOUND COMPLETE  Comparison:  None.  Findings:  Right Kidney:  Measures 10.7 cm.  Size and parenchymal echogenicity.  Multiple cyst noted within the upper pole the right kidney.  The largest measures 4.1 x 3.9 x 3.5 cm. Stone is identified within the inferior pole collecting system of the right kidney measuring 1.3 cm.  No evidence of mass or hydronephrosis.  Left Kidney:  Measures 12 cm.  Normal in size and parenchymal echogenicity.  No evidence of mass or hydronephrosis. Multiple cysts are identified.  Largest cyst is in the inferior pole and contains an eccentric area of mural calcification or echogenic nodule.  This measures 4.1 x 4.4 x 4.6 cm  Bladder:  Collapsed around a Foley catheter.  IMPRESSION:  1.  Bilateral renal cysts.  A complicated cyst within the inferior pole of the left kidney measures 4 cm.  Advise follow-up imaging with non emergent CT or MRI. 2.  No obstructive uropathy. 3.  Nonobstructing right renal stone.   Original Report Authenticated By: Rosealee Albee, M.D.     Dg Chest Port 1 View  10/12/2012  *RADIOLOGY REPORT*  Clinical Data: Weakness  , dizziness  PORTABLE CHEST - 1 VIEW  Comparison:  04/15/2012  Findings: Cardiomediastinal silhouette is stable.  Status post median sternotomy.  No acute infiltrate or pleural effusion.  No pulmonary edema.  IMPRESSION: No active disease.   Original Report Authenticated By: Natasha Mead, M.D.     PHYSICAL EXAM General: NAD Neck: No JVD, no thyromegaly or thyroid nodule.  Lungs: Clear to auscultation bilaterally with normal respiratory effort. CV: Nondisplaced PMI.  Heart regular S1/S2, no S3/S4, 2/6 HSM at apex.  No peripheral edema.  No carotid bruit.  Normal pedal pulses.  Abdomen: Soft, nontender, no hepatosplenomegaly, no distention.  Neurologic: Alert and oriented x 3.  Psych: Normal affect. Extremities: No clubbing or cyanosis.   TELEMETRY: Reviewed telemetry pt in NSR  ASSESSMENT AND PLAN:  78 yo with history of CAD s/p CABG, ischemic cardiomyopathy/systolic CHF, CKD, and infarction-related probably severe MR presented with dehydration/ARF/hyperkalemia/bradycardia likely due to ACEI and overdiuresis.  1. ARF: Acute on chronic kidney injury.  Creatinine nearing baseline.  Continue to hold diuretics and will hydrate with NS for a few more hours.  Stop NS this afternoon.  Will hold off on use of ACEI in the future, had increased lisinopril to 5 mg daily a week or so before this event.  2. Chronic systolic CHF: EF 40-98% with probably severe infarction related MR.  Has history of CHF exacerbations.  Presented volume depleted.  - He will need to eventually go back on some Lasix.  Would use 40 mg once daily starting a day or 2 after discharge.  - Avoid ACEI for now.  Will likely start him tomorrow on hydralazine 12.5 mg tid + Imdur 30.  - Hold off on spironolactone for now.  - Starting back on low dose Coreg today, 3.125 mg bid.  3. Atrial fibrillation: remaining in NSR on amiodarone and coumadin.  4.  Bradycardia: Likely due to metabolic derangement at time of admission.  I will stop Toprol XL and use only low dose Coreg for now, 3.125 mg bid.  He is also on low dose amiodarone.   [name] 10/14/2012 8:58 AM

## 2012-10-14 NOTE — Progress Notes (Signed)
ANTICOAGULATION CONSULT NOTE - Follow-up Consult  Pharmacy Consult for coumadin Indication: atrial fibrillation  No Known Allergies  Labs:  Basename 10/14/12 0548 10/13/12 0555 10/12/12 1212  HGB -- 10.6* 11.9*  HCT -- 31.6* 34.6*  PLT -- 127* 146*  APTT -- -- 72*  LABPROT 29.5* 27.1* 23.2*  INR 2.99* 2.67* 2.16*  HEPARINUNFRC -- -- --  CREATININE 2.62* 3.94* 5.20*  CKTOTAL -- -- --  CKMB -- -- --  TROPONINI -- -- <0.30    Estimated Creatinine Clearance: 19.1 ml/min (by C-G formula based on Cr of 2.62).   Assessment: 78 yo with a hx afib who was admitted for hypotension and bradycardia.   INR continues to increase on home dose of Coumadin, no bleeding noted  PTA: 5mg  qday except 2.5mg  TTSat  Goal of Therapy:  INR 2-3 Monitor platelets by anticoagulation protocol: Yes   Plan:  1) Discontinue scheduled dose 2) Coumadin 2.5 mg po x 1 dose today 3) Continue daily INR  Thank you. Okey Regal, PharmD [phone]  10/14/2012 9:07 AM

## 2012-10-14 NOTE — Progress Notes (Signed)
Brief Nutrition Note:   RD was pulled to pt for MST (Malnutrition Screening Tool) score of 2, indicating weight loss. Comments state "new fluid pills."  Pt does not indicate a poor appetite. Weight loss from fluids is not appropriate for nutrition intervention.   Body mass index is 25.30 kg/(m^2). Pt is overweight per current BMI  Current diet is heart heathy, eating 85% of meal.   Chart reviewed, no nutrition interventions warranted at this time. Please consult as needed. '  Clarene Duke RD, LDN Pager [phone] After Hours pager [phone]

## 2012-10-15 ENCOUNTER — Other Ambulatory Visit: Payer: Medicare Other

## 2012-10-15 ENCOUNTER — Telehealth: Payer: Self-pay | Admitting: Cardiology

## 2012-10-15 DIAGNOSIS — E861 Hypovolemia: Secondary | ICD-10-CM

## 2012-10-15 DIAGNOSIS — E875 Hyperkalemia: Secondary | ICD-10-CM

## 2012-10-15 LAB — CBC
HCT: 33.2 % — ABNORMAL LOW (ref 39.0–52.0)
Hemoglobin: 11.1 g/dL — ABNORMAL LOW (ref 13.0–17.0)
MCH: 28 pg (ref 26.0–34.0)
MCHC: 33.4 g/dL (ref 30.0–36.0)
MCV: 83.6 fL (ref 78.0–100.0)

## 2012-10-15 LAB — BASIC METABOLIC PANEL
BUN: 37 mg/dL — ABNORMAL HIGH (ref 6–23)
CO2: 20 mEq/L (ref 19–32)
Chloride: 109 mEq/L (ref 96–112)
Glucose, Bld: 83 mg/dL (ref 70–99)
Potassium: 4.6 mEq/L (ref 3.5–5.1)

## 2012-10-15 MED ORDER — ISOSORBIDE DINITRATE 10 MG PO TABS: 10.0000 mg | ORAL_TABLET | Freq: Three times a day (TID) | ORAL | Status: DC

## 2012-10-15 MED ORDER — WARFARIN SODIUM 2.5 MG PO TABS
2.5000 mg | ORAL_TABLET | Freq: Once | ORAL | Status: DC
Start: 2012-10-15 — End: 2012-10-15
  Filled 2012-10-15: qty 1

## 2012-10-15 MED ORDER — CARVEDILOL 3.125 MG PO TABS: 3.1250 mg | ORAL_TABLET | Freq: Two times a day (BID) | ORAL | Status: DC

## 2012-10-15 MED ORDER — POTASSIUM CHLORIDE CRYS ER 20 MEQ PO TBCR: 20.0000 meq | EXTENDED_RELEASE_TABLET | Freq: Every day | ORAL | Status: DC

## 2012-10-15 MED ORDER — ISOSORBIDE MONONITRATE ER 30 MG PO TB24: 30.0000 mg | ORAL_TABLET | Freq: Every day | ORAL | Status: DC

## 2012-10-15 MED ORDER — HYDRALAZINE HCL 25 MG PO TABS: 12.5000 mg | ORAL_TABLET | Freq: Three times a day (TID) | ORAL | Status: DC

## 2012-10-15 MED ORDER — FUROSEMIDE 40 MG PO TABS: 40.0000 mg | ORAL_TABLET | Freq: Every day | ORAL | Status: DC

## 2012-10-15 NOTE — Discharge Summary (Signed)
Discharge Summary   Patient ID: [name] [name],  MRN: [id number], DOB/AGE: April 11, 1930 78 y.o.  Admit date: 10/12/2012 Discharge date: 10/15/2012  Primary Physician: Seymour Bars, DO Primary Cardiologist: [name], MD  Discharge Diagnoses Principal Problem:  *Acute on chronic renal failure  - secondary to ACEi up-titration and over-diuresis   - Hydrated, Lasix and BB held, ACEi and spiro d/ced  - Cr 5.20 > 3.94 > 2.62 > 1.87  - Lasix to be resumed shortly after d/c  - Hydralazine/Isordil added  - Toprol-XL replaced with Coreg Active Problems:  HYPERLIPIDEMIA  CAD  ULCERATIVE COLITIS, HX OF  ANEMIA, IRON DEFICIENCY  Mitral regurgitation  Chronic systolic CHF (congestive heart failure)  Hypotension  Bradycardia  Paroxysmal atrial fibrillation  Hypovolemia  Hyperkalemia  Allergies No Known Allergies  Diagnostic Studies/Procedures  PORTABLE CHEST X-RAY - 10/12/12  Comparison: 04/15/2012  Findings: Cardiomediastinal silhouette is stable. Status post  median sternotomy. No acute infiltrate or pleural effusion. No  pulmonary edema.  IMPRESSION:  No active disease.   History of Present Illness  [name] is a 78yo male admitted to [facility] hospital with the above problem list. He presented from [facility] c/o dizziness on standing. He denied true ischemic, arrhythmic or CHF-type symptoms. He was bradycardic (HR 30s) and hypotensive (SBP 60s). He was found to have A/CKD with initial Cr at 5.20. He was still making urine. He was hyperkalemic at 5.8. Trop-I returned WNL. EKG was without ischemic changes.    Hospital Course   He was hydrated. ACEi, spironolactone, Lasix and BB were held. Other outpatient meds/supplements were continued. His renal function improved with hydration (Cr trend above). He was initial hypovolemic, but neared euvolemia on discharge. TSH returned WNL. His symptoms improved. The thought was that his A/CKD was the result of recent ACEi increase (~1  week prior to admission) and overdiuresis. Hypotension was the result of over-diuresis, and bradycardia the result of hyperkalemia. Potassium down-trended to WNL. He remained in NSR on amiodarone. Coumadin was continued. Several medication adjustments were made including the discontinuation of ACEi and spironolactone in favor of adding hydralazine/isordil. Toprol-XL was replaced by Coreg once the patient's HR improved, with good tolerance. He was assessed by Dr. [name] and felt to be stable for discharge today. He will be discharged on the medication changes aforementioned, and resumed on previous meds listed below. Lasix + K-Dur will be resumed tomorrow. He will be seen in </= 7 days by Dr. [name] with follow-up BMET and Coumadin clinic visit as noted below. This information has been clearly outlined in the discharge AVS.   Discharge Vitals:  Blood pressure 119/64, pulse 54, temperature 98 F (36.7 C), temperature source Oral, resp. rate 18, height 5\' 4"  (1.626 m), weight 66.2 kg (145 lb 15.1 oz), SpO2 100.00%.   Weight change: -0.388 kg (-13.7 oz)  Labs: Recent Labs  [facility] 10/15/12 0540 10/13/12 0555   WBC 8.4 7.1   HGB 11.1* 10.6*   HCT 33.2* 31.6*   MCV 83.6 82.1   PLT 137* 127*    Lab 10/15/12 0540 10/14/12 0548 10/13/12 0555 10/12/12 1212  NA 138 137 138 --  K 4.6 4.8 5.6* --  CL 109 110 110 --  CO2 20 18* 18* --  BUN 37* 56* 73* --  CREATININE 1.87* 2.62* 3.94* --  CALCIUM 9.2 8.8 9.2 --  PROT -- -- -- 8.2  BILITOT -- -- -- 0.4  ALKPHOS -- -- -- 82  ALT -- -- -- 28  AST -- -- --  35  AMYLASE -- -- -- --  LIPASE -- -- -- --  GLUCOSE 83 77 65* --   Recent Labs  Basename 10/12/12 1212   CKTOTAL --   CKMB --   CKMBINDEX --   TROPONINI <0.30    Basename 10/13/12 0555  TSH 0.826  T4TOTAL --  T3FREE --  THYROIDAB --    Disposition:  Discharge Orders    Future Appointments: Provider: Department: Dept Phone: Center:   10/17/2012 8:00 AM [facility]  [facility] [phone] None   10/17/2012 8:30 AM [name], MD [facility] [phone] [facility]   11/05/2012 9:30 AM [facility] [phone] None   11/25/2012 11:00 AM [name], MD [facility] [phone] [facility]     Follow-up Information    Follow up with [facility]. On 10/17/2012. (At 8:00 AM for Coumadin clinic appointment. )    Contact information:   [address] [place] [address]       Follow up with [name], MD. On 10/17/2012. (At 8:30 AM for follow-up after this hospitalization. You will have labwork done at this time.  )    Contact information:   [address] [place] [address] [phone]          Discharge Medications:    Medication List     As of 10/15/2012 10:50 AM    START taking these medications         carvedilol 3.125 MG tablet   Commonly known as: COREG   Take 1 tablet (3.125 mg total) by mouth 2 (two) times daily with a meal.      hydrALAZINE 25 MG tablet   Commonly known as: APRESOLINE   Take 0.5 tablets (12.5 mg total) by mouth 3 (three) times daily.      isosorbide dinitrate 10 MG tablet   Commonly known as: ISORDIL   Take 1 tablet (10 mg total) by mouth 3 (three) times daily.      CHANGE how you take these medications         furosemide 40 MG tablet   Commonly known as: LASIX   Take 1 tablet (40 mg total) by mouth daily.   Start taking on: 10/16/2012   What changed: - how often to take the med - doctor's instructions      potassium chloride SA 20 MEQ tablet   Commonly known as: K-DUR,KLOR-CON   Take 1 tablet (20 mEq total) by mouth daily.   Start taking on: 10/16/2012   What changed: doctor's instructions      CONTINUE taking these medications         acetaminophen 650 MG CR tablet   Commonly known as: TYLENOL      amiodarone 200 MG tablet   Commonly known as: PACERONE       aspirin EC 81 MG tablet      atorvastatin 40 MG tablet   Commonly known as: LIPITOR      ferrous sulfate 325 (65 FE) MG tablet      fish oil-omega-3 fatty acids 1000 MG capsule      FLAXSEED (LINSEED) PO      folic acid 400 MCG tablet   Commonly known as: FOLVITE      ICAPS AREDS FORMULA PO      multivitamin with minerals Tabs      nitroGLYCERIN 0.4 MG SL tablet   Commonly known as:  NITROSTAT      Tamsulosin HCl 0.4 MG Caps   Commonly known as: FLOMAX      VITAMIN D-3 PO      warfarin 5 MG tablet   Commonly known as: COUMADIN      STOP taking these medications         aspirin 81 MG tablet      lisinopril 5 MG tablet   Commonly known as: PRINIVIL,ZESTRIL      metoprolol succinate 50 MG 24 hr tablet   Commonly known as: TOPROL-XL      spironolactone 25 MG tablet   Commonly known as: ALDACTONE          Where to get your medications    These are the prescriptions that you need to pick up. We sent them to a specific pharmacy, so you will need to go there to get them.   [id number] - [place], [place] - [address]    [address] [place] [address]    Phone: [phone]        carvedilol 3.125 MG tablet   furosemide 40 MG tablet   hydrALAZINE 25 MG tablet   isosorbide dinitrate 10 MG tablet   potassium chloride SA 20 MEQ tablet           Outstanding Labs/Studies: PT/INR/BMET on 10/17/12  Duration of Discharge Encounter: Greater than 30 minutes including physician time.  Signed, R. Hurman Horn, PA-C 10/15/2012, 10:50 AM

## 2012-10-15 NOTE — Telephone Encounter (Signed)
Per pt call the Isosorbide is too expensive and he needs something else called in

## 2012-10-15 NOTE — Progress Notes (Signed)
ANTICOAGULATION CONSULT NOTE - Follow-up Consult  Pharmacy Consult for coumadin Indication: atrial fibrillation  No Known Allergies  Labs:  Basename 10/15/12 0540 10/14/12 0548 10/13/12 0555 10/12/12 1212  HGB 11.1* -- 10.6* --  HCT 33.2* -- 31.6* 34.6*  PLT 137* -- 127* 146*  APTT -- -- -- 72*  LABPROT 29.2* 29.5* 27.1* --  INR 2.95* 2.99* 2.67* --  HEPARINUNFRC -- -- -- --  CREATININE 1.87* 2.62* 3.94* --  CKTOTAL -- -- -- --  CKMB -- -- -- --  TROPONINI -- -- -- <0.30    Estimated Creatinine Clearance: 26.8 ml/min (by C-G formula based on Cr of 1.87).   Assessment: 78 yo with a hx afib who was admitted for hypotension and bradycardia.   INR continues to increase on home dose of Coumadin, no bleeding noted  PTA: 5mg  qday except 2.5mg  TTSat  Goal of Therapy:  INR 2-3 Monitor platelets by anticoagulation protocol: Yes   Plan:  1) At discharge, resume home dose 2) Coumadin 2.5 mg po x 1 dose today 3) Continue daily INR  Thank you. Okey Regal, PharmD [phone]  10/15/2012 8:50 AM

## 2012-10-15 NOTE — Discharge Instructions (Signed)
PLEASE REMEMBER TO BRING ALL OF YOUR MEDICATIONS TO EACH OF YOUR FOLLOW-UP OFFICE VISITS.  PLEASE TAKE ALL NEW MEDICATIONS/MEDICATION CHANGES AS PRESCRIBED.   PLEASE ATTEND ALL SCHEDULED FOLLOW-UP APPOINTMENTS.

## 2012-10-15 NOTE — Progress Notes (Signed)
Patient ID: [name] [name], male   DOB: March 01, 1930, 78 y.o.   MRN: [id number]     SUBJECTIVE: Feels much better.  No dyspnea.  Walked halls without problems yesterday.  Creatinine down to 1.8. Off IV fluid.      [name] amiodarone  200 mg Oral Daily  . antiseptic oral rinse  15 mL Mouth Rinse BID  . aspirin  81 mg Oral Daily  . atorvastatin  40 mg Oral Daily  . carvedilol  3.125 mg Oral BID WC  . ferrous sulfate  325 mg Oral Q breakfast  . folic acid  500 mcg Oral Daily  . omega-3 acid ethyl esters  2 g Oral Daily  . sodium chloride  3 mL Intravenous Q12H  . Tamsulosin HCl  0.4 mg Oral Daily  . warfarin  2.5 mg Oral ONCE-1800  . Warfarin - Pharmacist Dosing Inpatient   Does not apply q1800  . DISCONTD: ciprofloxacin  250 mg Oral BID  . DISCONTD: warfarin  2.5 mg Oral Q T,Th,Sat-1800  . DISCONTD: warfarin  5 mg Oral Q M,W,F,Su-1800      Filed Vitals:   10/14/12 0952 10/14/12 1358 10/14/12 2013 10/15/12 0428  BP: 140/66 144/63 136/68 111/65  Pulse: 58 62 64 58  Temp:  97.4 F (36.3 C) 97.7 F (36.5 C) 98 F (36.7 C)  TempSrc:  Oral Oral Oral  Resp:  16 19 18   Height:      Weight:    145 lb 15.1 oz (66.2 kg)  SpO2: 100% 100% 100% 100%    Intake/Output Summary (Last 24 hours) at 10/15/12 0746 Last data filed at 10/14/12 1853  Gross per 24 hour  Intake 1170.5 ml  Output    325 ml  Net  845.5 ml    LABS: Basic Metabolic Panel:  Basename 10/15/12 0540 10/14/12 0548  NA 138 137  K 4.6 4.8  CL 109 110  CO2 20 18*  GLUCOSE 83 77  BUN 37* 56*  CREATININE 1.87* 2.62*  CALCIUM 9.2 8.8  MG -- --  PHOS -- --   Liver Function Tests:  [facility] 10/12/12 1212  AST 35  ALT 28  ALKPHOS 82  BILITOT 0.4  PROT 8.2  ALBUMIN 3.6   No results found for this basename: LIPASE:2,AMYLASE:2 in the last 72 hours CBC:  Basename 10/15/12 0540 10/13/12 0555 10/12/12 1212  WBC 8.4 7.1 --  NEUTROABS -- -- 6.4  HGB 11.1* 10.6* --  HCT 33.2* 31.6* --  MCV 83.6 82.1 --  PLT 137*  127* --   Cardiac Enzymes:  Basename 10/12/12 1212  CKTOTAL --  CKMB --  CKMBINDEX --  TROPONINI <0.30   BNP: No components found with this basename: POCBNP:3 D-Dimer: No results found for this basename: DDIMER:2 in the last 72 hours Hemoglobin A1C: No results found for this basename: HGBA1C in the last 72 hours Fasting Lipid Panel: No results found for this basename: CHOL,HDL,LDLCALC,TRIG,CHOLHDL,LDLDIRECT in the last 72 hours Thyroid Function Tests:  Basename 10/13/12 0555  TSH 0.826  T4TOTAL --  T3FREE --  THYROIDAB --   Anemia Panel: No results found for this basename: VITAMINB12,FOLATE,FERRITIN,TIBC,IRON,RETICCTPCT in the last 72 hours  RADIOLOGY: US Renal  10/12/2012  *RADIOLOGY REPORT*  Clinical Data:  Renal failure.  History of kidney stones.  RENAL/URINARY TRACT ULTRASOUND COMPLETE  Comparison:  None.  Findings:  Right Kidney:  Measures 10.7 cm.   Size and parenchymal echogenicity.  Multiple cyst noted within the upper pole the right kidney.  The largest measures 4.1 x 3.9 x 3.5 cm. Stone is identified within the inferior pole collecting system of the right kidney measuring 1.3 cm.  No evidence of mass or hydronephrosis.  Left Kidney:  Measures 12 cm.  Normal in size and parenchymal echogenicity.  No evidence of mass or hydronephrosis. Multiple cysts are identified.  Largest cyst is in the inferior pole and contains an eccentric area of mural calcification or echogenic nodule.  This measures 4.1 x 4.4 x 4.6 cm  Bladder:  Collapsed around a Foley catheter.  IMPRESSION:  1.  Bilateral renal cysts.  A complicated cyst within the inferior pole of the left kidney measures 4 cm.  Advise follow-up imaging with non emergent CT or MRI. 2.  No obstructive uropathy. 3.  Nonobstructing right renal stone.   Original Report Authenticated By: Rosealee Albee, M.D.    Dg Chest Port 1 View  10/12/2012  *RADIOLOGY REPORT*  Clinical Data: Weakness  , dizziness  PORTABLE CHEST - 1 VIEW   Comparison:  04/15/2012  Findings: Cardiomediastinal silhouette is stable.  Status post median sternotomy.  No acute infiltrate or pleural effusion.  No pulmonary edema.  IMPRESSION: No active disease.   Original Report Authenticated By: Natasha Mead, M.D.     PHYSICAL EXAM General: NAD Neck: No JVD, no thyromegaly or thyroid nodule.  Lungs: Clear to auscultation bilaterally with normal respiratory effort. CV: Nondisplaced PMI.  Heart regular S1/S2, no S3/S4, 2/6 HSM at apex.  No peripheral edema.  No carotid bruit.  Normal pedal pulses.  Abdomen: Soft, nontender, no hepatosplenomegaly, no distention.  Neurologic: Alert and oriented x 3.  Psych: Normal affect. Extremities: No clubbing or cyanosis.   TELEMETRY: Reviewed telemetry pt in NSR  ASSESSMENT AND PLAN:  78 yo with history of CAD s/p CABG, ischemic cardiomyopathy/systolic CHF, CKD, and infarction-related probably severe MR presented with dehydration/ARF/hyperkalemia/bradycardia likely due to ACEI and overdiuresis.  1. ARF: Acute on chronic kidney injury.  Creatinine back to baseline.  Will hold off on use of ACEI for now, had increased lisinopril to 5 mg daily a week or so before this event.  2. Chronic systolic CHF: EF 16-10% with probably severe infarction related MR.  Has history of CHF exacerbations.  Appears near-euvolemic now, was dehydrated at admission.  - Restart Lasix 40 mg daily tomorrow.   - Avoid ACEI for now.  Will start hydralazine 12.5 mg tid and isordil 10 mg tid in lieu of ACEI.   - Hold off on spironolactone for now.  - Continue Coreg 3.125 mg bid, titrate up as outpatient.   3. Atrial fibrillation: remaining in NSR on amiodarone and coumadin.  4. Bradycardia: Likely due to metabolic derangement at time of admission.  I will stopped Toprol XL and now am using Coreg 3.125 mg bid.  He is also on low dose amiodarone.  5. Disposition: May go home today.  He needs to see me in the office in 1 week (may overbook) with BMET  on the day of return.  Needs coumadin followup.  Cardiac medications: Lasix 40 mg daily, KCl 20 mEq daily, Coreg 3.125 mg bid, amiodarone 200 mg daily, hydralazine 12.5 mg tid, isordil 10 mg tid, atorvastatin 40 mg daily, ASA 81 mg daily, coumadin.  For now, stop lisinopril, hydralazine, and Toprol XL.   [name] 10/15/2012 7:46 AM

## 2012-10-15 NOTE — Progress Notes (Signed)
Patient's IV and telemetry has been discontinued, patient and wife understands discharge instructions and is currently being discharged.  Lorretta Harp RN

## 2012-10-15 NOTE — Telephone Encounter (Signed)
Reviewed with Dr [name]. Pt can take isosorbide mononitrate 30mg  daily instead of isosorbide dinitrate 10mg  tid. Pt advised of this change.

## 2012-10-17 ENCOUNTER — Ambulatory Visit (INDEPENDENT_AMBULATORY_CARE_PROVIDER_SITE_OTHER): Payer: Medicare Other | Admitting: Cardiology

## 2012-10-17 ENCOUNTER — Encounter: Payer: Self-pay | Admitting: Cardiology

## 2012-10-17 ENCOUNTER — Ambulatory Visit (INDEPENDENT_AMBULATORY_CARE_PROVIDER_SITE_OTHER): Payer: Medicare Other | Admitting: *Deleted

## 2012-10-17 VITALS — BP 104/64 | HR 64 | Ht 64.0 in | Wt 152.8 lb

## 2012-10-17 DIAGNOSIS — I340 Nonrheumatic mitral (valve) insufficiency: Secondary | ICD-10-CM

## 2012-10-17 DIAGNOSIS — E785 Hyperlipidemia, unspecified: Secondary | ICD-10-CM

## 2012-10-17 DIAGNOSIS — N189 Chronic kidney disease, unspecified: Secondary | ICD-10-CM

## 2012-10-17 DIAGNOSIS — I2510 Atherosclerotic heart disease of native coronary artery without angina pectoris: Secondary | ICD-10-CM

## 2012-10-17 DIAGNOSIS — I4891 Unspecified atrial fibrillation: Secondary | ICD-10-CM

## 2012-10-17 DIAGNOSIS — I5022 Chronic systolic (congestive) heart failure: Secondary | ICD-10-CM

## 2012-10-17 DIAGNOSIS — I480 Paroxysmal atrial fibrillation: Secondary | ICD-10-CM

## 2012-10-17 DIAGNOSIS — I509 Heart failure, unspecified: Secondary | ICD-10-CM

## 2012-10-17 DIAGNOSIS — I059 Rheumatic mitral valve disease, unspecified: Secondary | ICD-10-CM

## 2012-10-17 NOTE — Patient Instructions (Signed)
Take your furosemide 40mg  twice a day for 2 days.   If you gain 2 pounds in 1 day or 3 pounds in a week take your furosemide twice a day. Until your weight goes back down.  You will need to have lab work today:  BMP  Follow-up with Norma Fredrickson NP in our office in 2 weeks same day as your Coumadin appointment. You will need to have lab work that day: BMP  Follow-up with Dr. [name] in 6 weeks

## 2012-10-17 NOTE — Progress Notes (Signed)
Patient ID: [name] [name], male   DOB: [birth year], 78 y.o.   MRN: [id number] PCP: Dr. Linford Arnold  78 yo with history of CAD s/p CABG, ulcerative colitis s/p colectomy, and CHF presents for cardiology followup.  He had CABG in 10/11 complicated by post-operative atrial fibrillation.  He was started on amiodarone while in the hospital after CABG and this was continued due to risk of further atrial fibrillation in the setting of significantly enlarged left atrium.  He was admitted to the hospital in [place] in 7/12 with a diastolic CHF exacerbation and was diuresed.  I had him stop amiodarone in 2012 given no atrial fibrillation recurrence.    In 7/13, he began to note increased exertional dyspnea.  He went to the hospital in [place] in 7/13 with a diastolic CHF exacerbation.  I am not sure if he was in atrial fibrillation at that time as I have no records of that hospitalization.  It sounds like he was only in the hospital for 2-3 days.  He had an echo done there with EF 35-40%, severe MR, PA systolic pressure 50-60 mmHg.   At a prior appointment, Mr Evetts was in atrial fibrillation with RVR.  I started him on coumadin (NOACs not covered by his insurance) and Toprol XL.  I sent him for TEE-guided cardioversion on amiodarone (INR did not stay therapeutic throught the pre-DCCV period so needed TEE).  He converted to NSR.  I did admit him after cardioversion in 9/13 for diuresis.  TEE prior to DCCV confirmed EF 35-40% with severe, post-infarction MR in the setting of severe hypokinesis to akinesis of the inferior and posterior walls.   He did well initially after discharge, but after uptitration of lisinopril to 5 mg daily, he developed ARF with hyperkalemia likely due to a combination of overdiuresis and ACEI use.  He was admitted last week and rehydrated.  ACEI and spironolactone were both stopped.  He is now on hydralazine/Imdur combination.  Lasix was cut back to once a day.  He seems to be doing  well today.  No lightheadedness.  No chest pain.  He is walking for exercise without significant dyspnea.  He only gets mildly short of breath walking up a hill.  He remains in NSR.   Labs (9/12): LDL 81, HDL 54, K 4.2, creatinine 1.9, BNP 1065 Labs (11/12): K 4.1, creatinine 1.7, BNP 837 Labs (1/13): K 3.7, creatinine 1.6, HDL 52, LDL 100 Labs (5/13): K 4.4, creatinine 1.5, HDL 47, LDL 76 Labs (7/13): creatinine 2.0 Labs (8/13): K 3.9, creatinine 1.9, BNP 1066 Labs (9/13): K 3.5, creatinine 2.4=>2.3, BNP 971, LFTs normal, TSH normal Labs (10/13): K 4.6, creatinine 1.87  PMH: 1. Paroxysmal atrial fibrillation: Noted only post-op CABG in 10/11.  Was not put on coumadin.  Initially on amiodarone but this was stopped. Back in atrial fibrillation in 8/13, amiodarone restarted along with coumadin.  TEE-guided DCCV in 9/13.   2. Knee osteoarthritis 3. Anemia of uncertain etiology: had erythropoietin shots for a period of time.  4. CAD: MI at age 89.  Had CABG in 10/11 with LIMA-LAD, SVG-D, and SVG-RCA.  Lexiscan myoview (9/13) with EF 34%, large inferior infarct from base to apex with no ischemia.   5. Systolic CHF: Echo (1/12) with EF 50%, inferior and inferoseptal hypokinesis, mild LV hypertrophy, moderate MR, severe left atrial enlargement.  Echo [facility] hospital, 7/13): EF 35-40%, severe MR, moderate-severe LAE, PASP 50-60 mmHg.  TEE (9/13): EF 35-40%, posterior and inferior severe HK  to AK, severe post-infarction MR, mildly decreased RV systolic function.  6. Ulcerative colitis: s/p total colectomy with ileostomy.  7. HTN 8. Hyperlipidemia 9. Nephrolithiasis 10. CKD: H/o ARF/hyperkalemia in the setting of dehydration.  He is off ACEI and spironolactone.  11. MR: Severe on 7/13 echo and 9/13 TEE.  Suspect post-infarction MR in the setting of inferoposterior wall motion abnormality.   SH: Retired from [employer].  Married, lives in [place]. Stopped smoking in 1970.    FH: Strong history  of CAD.  Father with MI at 42.  All siblings (11) have now passed away, many with heart disease.   ROS: All systems reviewed and negative except as per HPI.   Current Outpatient Prescriptions  Medication Sig Dispense Refill  . acetaminophen (TYLENOL) 650 MG CR tablet Take 650 mg by mouth every 8 (eight) hours as needed. arthritis      . amiodarone (PACERONE) 200 MG tablet Take 200 mg by mouth daily.      [name] aspirin 81 MG tablet Take 81 mg by mouth daily.      [name] atorvastatin (LIPITOR) 40 MG tablet Take 40 mg by mouth daily.      . carvedilol (COREG) 3.125 MG tablet Take 1 tablet (3.125 mg total) by mouth 2 (two) times daily with a meal.  60 tablet  3  . Cholecalciferol (VITAMIN D-3 PO) Take 1,000 Units by mouth daily.       . ferrous sulfate 325 (65 FE) MG tablet Take 325 mg by mouth every evening.      . fish oil-omega-3 fatty acids 1000 MG capsule Take 2 g by mouth daily.       [name] FLAXSEED, LINSEED, PO Take 1 tablet by mouth every evening.      . folic acid (FOLVITE) 400 MCG tablet Take 400 mcg by mouth daily.       . furosemide (LASIX) 40 MG tablet Take 1 tablet (40 mg total) by mouth daily.  30 tablet  3  . hydrALAZINE (APRESOLINE) 25 MG tablet Take 0.5 tablets (12.5 mg total) by mouth 3 (three) times daily.  45 tablet  3  . isosorbide mononitrate (IMDUR) 30 MG 24 hr tablet Take 30 mg by mouth daily.      . Multiple Vitamin (MULITIVITAMIN WITH MINERALS) TABS Take 1 tablet by mouth daily.      . Multiple Vitamins-Minerals (ICAPS AREDS FORMULA PO) Take 1 capsule by mouth every evening.      . nitroGLYCERIN (NITROSTAT) 0.4 MG SL tablet Place 0.4 mg under the tongue every 5 (five) minutes as needed. Chest pain      . potassium chloride SA (K-DUR,KLOR-CON) 20 MEQ tablet Take 1 tablet (20 mEq total) by mouth daily.  30 tablet  3  . Tamsulosin HCl (FLOMAX) 0.4 MG CAPS Take 0.4 mg by mouth every evening.      . warfarin (COUMADIN) 5 MG tablet Take 2.5-5 mg by mouth daily. Takes half tablet on  Tuesday, Thursday and Saturday Takes whole tablet on Monday, Wednesday, Friday and Sunday      . DISCONTD: pantoprazole (PROTONIX) 40 MG tablet Take 40 mg by mouth daily.          BP 104/64  Pulse 64  Ht 5\' 4"  (1.626 m)  Wt 152 lb 12.8 oz (69.31 kg)  BMI 26.23 kg/m2 General: NAD Neck: JVP not elevated, no thyromegaly or thyroid nodule.  Lungs: Clear to auscultation bilaterally with normal respiratory effort. CV: Nondisplaced PMI.  Heart irregular S1/S2,  no S3/S4, 2/6 HSM at apex. 1+ ankle edema bilaterally.  No carotid bruit.  Abdomen: Soft, nontender, no hepatosplenomegaly, no distention.  Neurologic: Alert and oriented x 3.  Psych: Normal affect. Extremities: No clubbing or cyanosis.   Assessment/Plan  1. Atrial fibrillation  Patient has been cardioverted to NSR and remains in NSR today on amiodarone.   It is possible that his cardiomyopathy is partly tachycardia-mediated.  - Continue coumadin, Coreg, amiodarone.  - TSH and LFTs normal recently.  He will need yearly eye exam. He has had baseline PFTs with amiodarone use.   2. Chronic systolic CHF (congestive heart failure)  EF 35-40% on recent TEE.  Cannot rule out component of tachy-mediated cardiomyopathy but suspect ischemic cardiomyopathy plays major role. He denies chest pain.  NYHA class II.   - He is off spironolactone and ACEI after recent ARF with hyperkalemia.  - He is now taking hydralazine and Imdur.  - Toprol XL has been replaced by Coreg.  - Lasix has been decreased to 40 mg daily.  He will take this dose but if he gains 2 lbs in a day or 3 lbs in a week, he will take Lasix 40 mg bid x 3 days.  He weighs daily.    - I will repeat echo in 11/13 while back in NSR.  - BMET today.  - Followup with Norma Fredrickson in 2 wks with BMET, followup with me in 6 weeks. Would try to titrate Coreg to 6.25 mg bid if there is BP room at next appointment.  3. HYPERLIPIDEMIA  LDL near goal when recently checked (goal < 70).  4. Mitral  regurgitation  Severe on recent TEE.  This appeared to be post-infarction MR with inferoposterior severe hypokinesis to akinesis.  We talked at length today about surgery to correct the MR.  He would be a difficult candidate for MVR given prior sternotomy and significant CKD.   He does not want to consider redo heart surgery.  I think this is reasonable given the risk and will try to manage him as well as we can medically.  5. CAD  Status post CABG.  Lexiscan myoview showed dense inferior scar with no ischemia.  Continue medical management.  He is on ASA 81, statin, Coreg.  6. CKD (chronic kidney disease) BMET.  Will need to follow creatinine closely.    [name] 10/17/2012

## 2012-10-18 LAB — BASIC METABOLIC PANEL
Calcium: 9.7 mg/dL (ref 8.4–10.5)
GFR: 32.3 mL/min — ABNORMAL LOW (ref >60.00)
Sodium: 144 mEq/L (ref 135–145)

## 2012-10-21 ENCOUNTER — Telehealth: Payer: Self-pay | Admitting: Cardiology

## 2012-10-21 NOTE — Telephone Encounter (Signed)
New Problem:    Patient returned Lynn's call about the results of his lab work.  Please call back.

## 2012-10-21 NOTE — Telephone Encounter (Signed)
Spoke with pt about recent lab results 

## 2012-10-23 ENCOUNTER — Ambulatory Visit (INDEPENDENT_AMBULATORY_CARE_PROVIDER_SITE_OTHER): Payer: Medicare Other | Admitting: Family Medicine

## 2012-10-23 ENCOUNTER — Encounter: Payer: Self-pay | Admitting: Family Medicine

## 2012-10-23 VITALS — BP 124/62 | HR 62 | Ht 62.5 in | Wt 152.0 lb

## 2012-10-23 DIAGNOSIS — I5032 Chronic diastolic (congestive) heart failure: Secondary | ICD-10-CM

## 2012-10-23 DIAGNOSIS — I10 Essential (primary) hypertension: Secondary | ICD-10-CM

## 2012-10-23 DIAGNOSIS — N189 Chronic kidney disease, unspecified: Secondary | ICD-10-CM

## 2012-10-23 DIAGNOSIS — I5022 Chronic systolic (congestive) heart failure: Secondary | ICD-10-CM

## 2012-10-23 DIAGNOSIS — Z8719 Personal history of other diseases of the digestive system: Secondary | ICD-10-CM

## 2012-10-23 DIAGNOSIS — I509 Heart failure, unspecified: Secondary | ICD-10-CM

## 2012-10-23 NOTE — Patient Instructions (Signed)
We are happy to do your coumadin for you.

## 2012-10-23 NOTE — Progress Notes (Signed)
Subjective:    Patient ID: [name] [name], male    DOB: 1930-01-04, 78 y.o.   MRN: [id number]  HPI  CHF - Was on Lasix BID and spironolacton. Went to [facility] on 10/13/12 because got dehydrated and hypotensive.  Was admitted for 3 days.  He is now on lasix once a day. Told to take 2 if gains more than 2 lbs in weight overnight. No CP or SOB. Taking medications regularly. He has been walking his weight daily. Upon discharge a hospital his ideal weight was 145 pounds. At home on his scalp today he weighed 146 pounds. He does not feel too dry or dehydrated today. It sounds like spinal October was discontinued.  Hx of illeostomy and bag for some 30 years for UC.  He has not had any problems with his ileostomy.  Review of Systems  BP 124/62  Pulse 62  Ht 5' 2.5" (1.588 m)  Wt 152 lb (68.947 kg)  BMI 27.36 kg/m2    No Known Allergies  Past Medical History  Diagnosis Date  . Coronary artery disease   . MI, old     AGE 50  . Ulcerative colitis   . Hyperlipidemia   . Hypertension   . Acute renal failure   . History of nephrolithiasis   . PAF (paroxysmal atrial fibrillation)   . LV dysfunction   . Myocardial infarction   . Anemia   . Blood transfusion   . Arthritis   . CHF (congestive heart failure)     Past Surgical History  Procedure Date  . Cardiac catheterization 10/14/2010    EF 30%  . Coronary artery bypass graft 10/24/2010    LIMA TO THE LAD, A SAPHENOUS VEIN GRAFT TO THE DIAGONAL 1 AND 3, AND SAPHENOUS VEIN GRAFT TO THE DISTAL RIGHT CORONARY ARTERY  . Ileostomy   . Colectomy   . Cataract extraction   . Transthoracic echocardiogram 01/16/2011    EF 50%  . Cardiovascular stress test 10/07/2010    EF 33%  . Appendectomy   . Cystostomy 04/22/2012    Procedure: CYSTOSTOMY SUPRAPUBIC;  Surgeon: Garnett Farm, MD;  Location: [facility];  Service: Urology;  Laterality: N/A;  CYSTOTOMY WITH MIDLINE INCISION REMOVAL OF STONE  . Tee without cardioversion 08/28/2012    Procedure:  TRANSESOPHAGEAL ECHOCARDIOGRAM (TEE);  Surgeon: [name], MD;  Location: [facility] ENDOSCOPY;  Service: Cardiovascular;  Laterality: N/A;  . Cardioversion 08/28/2012    Procedure: CARDIOVERSION;  Surgeon: [name], MD;  Location: [facility] ENDOSCOPY;  Service: Cardiovascular;  Laterality: N/A;    History   Social History  . Marital Status: Married    Spouse Name: N/A    Number of Children: N/A  . Years of Education: N/A   Occupational History  . Not on file.   Social History Main Topics  . Smoking status: Former Smoker -- 1.0 packs/day for 15 years    Types: Cigarettes, Cigars    Quit date: 12/25/1968  . Smokeless tobacco: Not on file  . Alcohol Use: No     occasional glass of wine  . Drug Use: No  . Sexually Active: Not on file   Other Topics Concern  . Not on file   Social History Narrative  . No narrative on file    Family History  Problem Relation Age of Onset  . Cancer Mother     colon  . Heart attack Father   . Hypertension Father   . Heart disease Sister   .  Hypertension Sister   . Heart disease Brother   . Hypertension Brother   . Heart disease Sister   . Hypertension Sister   . Heart disease Sister   . Hypertension Sister   . Heart disease Brother   . Hypertension Brother   . Heart disease Brother   . Hypertension Brother   . Heart disease Brother   . Hypertension Brother   . Heart disease Brother   . Hypertension Brother   . Heart disease Brother   . Hypertension Brother   . Heart disease Brother   . Hypertension Brother   . Heart disease Brother   . Hypertension Brother     Outpatient Encounter Prescriptions as of 10/23/2012  Medication Sig Dispense Refill  . acetaminophen (TYLENOL) 650 MG CR tablet Take 650 mg by mouth every 8 (eight) hours as needed. arthritis      . amiodarone (PACERONE) 200 MG tablet Take 200 mg by mouth daily.      [name] aspirin 81 MG tablet Take 81 mg by mouth daily.      [name] atorvastatin (LIPITOR) 40 MG tablet Take 40 mg  by mouth daily.      . carvedilol (COREG) 3.125 MG tablet Take 1 tablet (3.125 mg total) by mouth 2 (two) times daily with a meal.  60 tablet  3  . Cholecalciferol (VITAMIN D-3 PO) Take 1,000 Units by mouth daily.       . ferrous sulfate 325 (65 FE) MG tablet Take 325 mg by mouth every evening.      . fish oil-omega-3 fatty acids 1000 MG capsule Take 2 g by mouth daily.       [name] FLAXSEED, LINSEED, PO Take 1 tablet by mouth every evening.      . folic acid (FOLVITE) 400 MCG tablet Take 400 mcg by mouth daily.       . furosemide (LASIX) 40 MG tablet Take 1 tablet (40 mg total) by mouth daily.  30 tablet  3  . hydrALAZINE (APRESOLINE) 25 MG tablet Take 0.5 tablets (12.5 mg total) by mouth 3 (three) times daily.  45 tablet  3  . isosorbide mononitrate (IMDUR) 30 MG 24 hr tablet Take 30 mg by mouth daily.      . Multiple Vitamin (MULITIVITAMIN WITH MINERALS) TABS Take 1 tablet by mouth daily.      . nitroGLYCERIN (NITROSTAT) 0.4 MG SL tablet Place 0.4 mg under the tongue every 5 (five) minutes as needed. Chest pain      . potassium chloride SA (K-DUR,KLOR-CON) 20 MEQ tablet Take 1 tablet (20 mEq total) by mouth daily.  30 tablet  3  . Tamsulosin HCl (FLOMAX) 0.4 MG CAPS Take 0.4 mg by mouth every evening.      . warfarin (COUMADIN) 5 MG tablet Take 2.5-5 mg by mouth daily. Takes half tablet on Tuesday, Thursday and Saturday Takes whole tablet on Monday, Wednesday, Friday and Sunday      . Multiple Vitamins-Minerals (ICAPS AREDS FORMULA PO) Take 1 capsule by mouth every evening.              Objective:   Physical Exam  Constitutional: He is oriented to person, place, and time. He appears well-developed and well-nourished.  HENT:  Head: Normocephalic and atraumatic.  Neck: Neck supple. No thyromegaly present.  Cardiovascular: Normal rate, regular rhythm and normal heart sounds.        No carotid bruits.    Pulmonary/Chest: Effort normal and breath sounds normal.  Abdominal:  Soft. Bowel sounds  are normal.       Ileostomy bag in the right lower quadrant. The bandage and bag are very clean.  Lymphadenopathy:    He has no cervical adenopathy.  Neurological: He is alert and oriented to person, place, and time.  Skin: Skin is warm and dry.  Psychiatric: He has a normal mood and affect. His behavior is normal.          Assessment & Plan:  CHF systolic/diastolic- goal weight of 145.  He is 146 today. Weight has been stable.  He does have a followup with his cardiologist next week. Continue to follow daily weights. He says his lisinopril was discontinued several weeks ago, I am assuming because of his renal function. He did have a BMP repeated last week.  HTN - Well controlled. Continue current regimen. Followup in 3 months.  A-fib- We will be happy to take over her coumadin at any time. He says he will get his coumadin clinic appt for next thurs adn then plan to f/u here afterwards. Explained how we do our coumadin clinic.    Chronic kidney disease-his creatinine was stable his last BMP. Per his report there repeating this next week.

## 2012-10-31 ENCOUNTER — Ambulatory Visit (INDEPENDENT_AMBULATORY_CARE_PROVIDER_SITE_OTHER): Payer: Medicare Other | Admitting: Nurse Practitioner

## 2012-10-31 ENCOUNTER — Other Ambulatory Visit (INDEPENDENT_AMBULATORY_CARE_PROVIDER_SITE_OTHER): Payer: Medicare Other

## 2012-10-31 ENCOUNTER — Ambulatory Visit (INDEPENDENT_AMBULATORY_CARE_PROVIDER_SITE_OTHER): Payer: Medicare Other

## 2012-10-31 ENCOUNTER — Encounter: Payer: Self-pay | Admitting: Nurse Practitioner

## 2012-10-31 VITALS — BP 132/70 | HR 64 | Ht 62.0 in | Wt 149.0 lb

## 2012-10-31 DIAGNOSIS — I5022 Chronic systolic (congestive) heart failure: Secondary | ICD-10-CM

## 2012-10-31 DIAGNOSIS — Z7901 Long term (current) use of anticoagulants: Secondary | ICD-10-CM

## 2012-10-31 DIAGNOSIS — I4891 Unspecified atrial fibrillation: Secondary | ICD-10-CM

## 2012-10-31 DIAGNOSIS — I2510 Atherosclerotic heart disease of native coronary artery without angina pectoris: Secondary | ICD-10-CM

## 2012-10-31 LAB — BASIC METABOLIC PANEL
Chloride: 106 mEq/L (ref 96–112)
Creatinine, Ser: 2.4 mg/dL — ABNORMAL HIGH (ref 0.4–1.5)
Potassium: 4.5 mEq/L (ref 3.5–5.1)

## 2012-10-31 LAB — POCT INR: INR: 2.8

## 2012-10-31 MED ORDER — CARVEDILOL 6.25 MG PO TABS: 6.2500 mg | ORAL_TABLET | Freq: Two times a day (BID) | ORAL | Status: DC

## 2012-10-31 NOTE — Progress Notes (Signed)
[name] [name] Date of Birth: 1930-02-21 Medical Record #[id number]  History of Present Illness: [name] [name] is seen back today for a 2 week check. He is seen for Dr. [name]. He has multiple medical issues which include CAD with prior CABG in 2011, ulcerative colitis with prior colectomy, and CHF. He has diastolic heart failure and atrial fib. He is back on amiodarone due to recurrent PAF. Last echo showed his EF of 35 to 40%, severe, MR, increased PA pressures with 50 to 60 mm HG. He has not tolerated ACE or Aldactone due to dehydration and hyperkalemia with ARF. Now on hydralazine/Imdur.   Dr. [name] started Coreg 2 weeks ago. Plan is for repeat echo in December. He has declined surgical intervention for his valve disease. He has opted for medical management.   He comes back today. He is here alone. Doing very well. Feels good. Not short of breath. Not dizzy or lightheaded. Weight is stable at home. No swelling. Feels good on his current regimen. Does note some bruising with his coumadin/aspirin. Notes that the potassium is costing more. Last K was 4.9. Staying active and [facility]  Current Outpatient Prescriptions on File Prior to Visit  Medication Sig Dispense Refill  . acetaminophen (TYLENOL) 650 MG CR tablet Take 650 mg by mouth every 8 (eight) hours as needed. arthritis      . amiodarone (PACERONE) 200 MG tablet Take 200 mg by mouth daily.      [name] aspirin 81 MG tablet Take 81 mg by mouth daily.      [name] atorvastatin (LIPITOR) 40 MG tablet Take 40 mg by mouth daily.      . Cholecalciferol (VITAMIN D-3 PO) Take 1,000 Units by mouth daily.       . ferrous sulfate 325 (65 FE) MG tablet Take 325 mg by mouth every evening.      . fish oil-omega-3 fatty acids 1000 MG capsule Take 2 g by mouth daily.       [name] FLAXSEED, LINSEED, PO Take 1 tablet by mouth every evening.      . folic acid (FOLVITE) 400 MCG tablet Take 400 mcg by mouth daily.       . hydrALAZINE (APRESOLINE)  25 MG tablet Take 0.5 tablets (12.5 mg total) by mouth 3 (three) times daily.  45 tablet  3  . isosorbide mononitrate (IMDUR) 30 MG 24 hr tablet Take 30 mg by mouth daily.      . Multiple Vitamin (MULITIVITAMIN WITH MINERALS) TABS Take 1 tablet by mouth daily.      . Multiple Vitamins-Minerals (ICAPS AREDS FORMULA PO) Take 1 capsule by mouth every evening.      . nitroGLYCERIN (NITROSTAT) 0.4 MG SL tablet Place 0.4 mg under the tongue every 5 (five) minutes as needed. Chest pain      . potassium chloride SA (K-DUR,KLOR-CON) 20 MEQ tablet Take 1 tablet (20 mEq total) by mouth daily.  30 tablet  3  . Tamsulosin HCl (FLOMAX) 0.4 MG CAPS Take 0.4 mg by mouth every evening.      . warfarin (COUMADIN) 5 MG tablet Take 2.5-5 mg by mouth daily. Takes half tablet on Tuesday, Thursday and Saturday Takes whole tablet on Monday, Wednesday, Friday and Sunday      . [DISCONTINUED] furosemide (LASIX) 40 MG tablet Take 1 tablet (40 mg total) by mouth daily.  30 tablet  3  . [DISCONTINUED] pantoprazole (PROTONIX) 40 MG tablet Take 40 mg by  mouth daily.          No Known Allergies  PMH:  1. Paroxysmal atrial fibrillation: Noted only post-op CABG in 10/11. Was not put on coumadin. Initially on amiodarone but this was stopped. Back in atrial fibrillation in 8/13, amiodarone restarted along with coumadin. TEE-guided DCCV in 9/13.  2. Knee osteoarthritis  3. Anemia of uncertain etiology: had erythropoietin shots for a period of time.  4. CAD: MI at age 80. Had CABG in 10/11 with LIMA-LAD, SVG-D, and SVG-RCA. Lexiscan myoview (9/13) with EF 34%, large inferior infarct from base to apex with no ischemia.  5. Systolic CHF: Echo (1/12) with EF 50%, inferior and inferoseptal hypokinesis, mild LV hypertrophy, moderate MR, severe left atrial enlargement. Echo [facility] hospital, 7/13): EF 35-40%, severe MR, moderate-severe LAE, PASP 50-60 mmHg. TEE (9/13): EF 35-40%, posterior and inferior severe HK to AK, severe  post-infarction MR, mildly decreased RV systolic function.  6. Ulcerative colitis: s/p total colectomy with ileostomy.  7. HTN  8. Hyperlipidemia  9. Nephrolithiasis  10. CKD: H/o ARF/hyperkalemia in the setting of dehydration. He is off ACEI and spironolactone.  11. MR: Severe on 7/13 echo and 9/13 TEE. Suspect post-infarction MR in the setting of inferoposterior wall motion abnormality.    Past Surgical History  Procedure Date  . Cardiac catheterization 10/14/2010    EF 30%  . Coronary artery bypass graft 10/24/2010    LIMA TO THE LAD, A SAPHENOUS VEIN GRAFT TO THE DIAGONAL 1 AND 3, AND SAPHENOUS VEIN GRAFT TO THE DISTAL RIGHT CORONARY ARTERY  . Ileostomy   . Colectomy   . Cataract extraction   . Transthoracic echocardiogram 01/16/2011    EF 50%  . Cardiovascular stress test 10/07/2010    EF 33%  . Appendectomy   . Cystostomy 04/22/2012    Procedure: CYSTOSTOMY SUPRAPUBIC;  Surgeon: Garnett Farm, MD;  Location: [facility];  Service: Urology;  Laterality: N/A;  CYSTOTOMY WITH MIDLINE INCISION REMOVAL OF STONE  . Tee without cardioversion 08/28/2012    Procedure: TRANSESOPHAGEAL ECHOCARDIOGRAM (TEE);  Surgeon: [name], MD;  Location: [facility] ENDOSCOPY;  Service: Cardiovascular;  Laterality: N/A;  . Cardioversion 08/28/2012    Procedure: CARDIOVERSION;  Surgeon: [name], MD;  Location: [facility] ENDOSCOPY;  Service: Cardiovascular;  Laterality: N/A;    History  Smoking status  . Former Smoker -- 1.0 packs/day for 15 years  . Types: Cigarettes, Cigars  . Quit date: 12/25/1968  Smokeless tobacco  . Not on file    History  Alcohol Use No    Comment: occasional glass of wine    Family History  Problem Relation Age of Onset  . Cancer Mother     colon  . Heart attack Father   . Hypertension Father   . Heart disease Sister   . Hypertension Sister   . Heart disease Brother   . Hypertension Brother   . Heart disease Sister   . Hypertension Sister   . Heart disease Sister     . Hypertension Sister   . Heart disease Brother   . Hypertension Brother   . Heart disease Brother   . Hypertension Brother   . Heart disease Brother   . Hypertension Brother   . Heart disease Brother   . Hypertension Brother   . Heart disease Brother   . Hypertension Brother   . Heart disease Brother   . Hypertension Brother   . Heart disease Brother   . Hypertension Brother  Review of Systems: The review of systems is per the HPI.  All other systems were reviewed and are negative.  Physical Exam: BP 132/70  Pulse 64  Ht 5\' 2"  (1.575 m)  Wt 149 lb (67.586 kg)  BMI 27.25 kg/m2 Patient is very pleasant and in no acute distress. He looks younger than his stated age. Skin is warm and dry. Color is normal.  HEENT is unremarkable. Normocephalic/atraumatic. PERRL. Sclera are nonicteric. Neck is supple. No masses. No JVD. Lungs are clear. Cardiac exam shows a regular rate and rhythm. Has a 2/6 murmur noted. Occasional ectopic noted. Abdomen is soft. Extremities are without edema today. Gait and ROM are intact. No gross neurologic deficits noted.  LABORATORY DATA: BMET is pending  Lab Results  Component Value Date   WBC 8.4 10/15/2012   HGB 11.1* 10/15/2012   HCT 33.2* 10/15/2012   PLT 137* 10/15/2012   GLUCOSE 81 10/17/2012   CHOL 137 05/14/2012   TRIG 69.0 05/14/2012   HDL 47.00 05/14/2012   LDLDIRECT  Value: 51 (NOTE) ATP III Classification (LDL):      < 100        mg/dL         Optimal     161 - 129     mg/dL         Near or Above Optimal     130 - 159     mg/dL         Borderline High     160 - 189     mg/dL         High      > 096        mg/dL         Very  High  0/45/4098   LDLCALC 76 05/14/2012   ALT 28 10/12/2012   AST 35 10/12/2012   NA 144 10/17/2012   K 4.9 10/17/2012   CL 118* 10/17/2012   CREATININE 2.1* 10/17/2012   BUN 33* 10/17/2012   CO2 19 10/17/2012   TSH 0.826 10/13/2012   INR 2.8 10/31/2012   HGBA1C  Value: 4.8 (NOTE)                                                                        According to the ADA Clinical Practice Recommendations for 2011, when HbA1c is used as a screening test:   >=6.5%   Diagnostic of Diabetes Mellitus           (if abnormal result  is confirmed)  5.7-6.4%   Increased risk of developing Diabetes Mellitus  References:Diagnosis and Classification of Diabetes Mellitus,Diabetes Care,2011,34(Suppl 1):S62-S69 and Standards of Medical Care in         Diabetes - 2011,Diabetes Care,2011,34  (Suppl 1):S11-S61. 10/19/2010   Assessment / Plan: 1. PAF - remains in sinus by physical exam today. Remains on amiodarone. On coumadin. For repeat echo next week. It was felt that his CM was partly tachycardia-mediated.  2. Chronic systolic/diastolic heart failure - he looks good. No evidence of volume overload. Tolerating his medicines. Check BMET today. Coreg is increased today. Has scheduled echo next week with follow up with Dr. [name] scheduled. Not able to take ACE/aldactone.   3. HLD  4. MR - he wishes to be managed medically.   5. CAD - prior CABG - no chest pain. On aspirin, statin and BB.   His Coreg is increased today. We will check labs today. Overall, he looks good. We will see him back as scheduled.   Patient is agreeable to this plan and will call if any problems develop in the interim.

## 2012-10-31 NOTE — Patient Instructions (Signed)
We need to check labs today  I want you to increase your Coreg to 6.25 mg two times a day. You can use two of your 3.125mg  tablets two times a day and use those up. The prescription for the 6.25 mg tablet is at the drug store.   Stay on your other medicines  We will see you back as planned.  Call the [facility] office at [phone] if you have any questions, problems or concerns.

## 2012-11-05 ENCOUNTER — Ambulatory Visit (HOSPITAL_COMMUNITY): Payer: Medicare Other | Attending: Cardiology | Admitting: Radiology

## 2012-11-05 DIAGNOSIS — I5022 Chronic systolic (congestive) heart failure: Secondary | ICD-10-CM

## 2012-11-05 DIAGNOSIS — I509 Heart failure, unspecified: Secondary | ICD-10-CM | POA: Insufficient documentation

## 2012-11-05 DIAGNOSIS — E785 Hyperlipidemia, unspecified: Secondary | ICD-10-CM | POA: Insufficient documentation

## 2012-11-05 DIAGNOSIS — I10 Essential (primary) hypertension: Secondary | ICD-10-CM | POA: Insufficient documentation

## 2012-11-05 DIAGNOSIS — I2510 Atherosclerotic heart disease of native coronary artery without angina pectoris: Secondary | ICD-10-CM | POA: Insufficient documentation

## 2012-11-05 DIAGNOSIS — I4891 Unspecified atrial fibrillation: Secondary | ICD-10-CM | POA: Insufficient documentation

## 2012-11-05 DIAGNOSIS — I517 Cardiomegaly: Secondary | ICD-10-CM | POA: Insufficient documentation

## 2012-11-05 DIAGNOSIS — I059 Rheumatic mitral valve disease, unspecified: Secondary | ICD-10-CM | POA: Insufficient documentation

## 2012-11-05 DIAGNOSIS — I25810 Atherosclerosis of coronary artery bypass graft(s) without angina pectoris: Secondary | ICD-10-CM

## 2012-11-05 NOTE — Progress Notes (Signed)
Echocardiogram performed.  

## 2012-11-08 ENCOUNTER — Telehealth: Payer: Self-pay | Admitting: Cardiology

## 2012-11-08 NOTE — Telephone Encounter (Signed)
Spoke with pt about recent echo results. 

## 2012-11-08 NOTE — Telephone Encounter (Signed)
Pt rtn call to anne, pls call [phone]

## 2012-11-11 ENCOUNTER — Telehealth: Payer: Self-pay | Admitting: Cardiology

## 2012-11-11 MED ORDER — POTASSIUM CHLORIDE CRYS ER 20 MEQ PO TBCR: 20.0000 meq | EXTENDED_RELEASE_TABLET | Freq: Every day | ORAL | Status: DC

## 2012-11-11 MED ORDER — AMIODARONE HCL 200 MG PO TABS: 200.0000 mg | ORAL_TABLET | Freq: Every day | ORAL | Status: DC

## 2012-11-11 NOTE — Telephone Encounter (Signed)
LMTCB

## 2012-11-11 NOTE — Telephone Encounter (Signed)
Pt's wife aware prescriptions are at front desk for pt to pick-up.

## 2012-11-11 NOTE — Telephone Encounter (Signed)
New problem:    Need a handwritten Rx  To take to [facility] hospital.  Please call patient to come by the office to pick up.     Amiodarone  200 mg  ,  Potassium chloride 20 meq.

## 2012-11-25 ENCOUNTER — Ambulatory Visit: Payer: Medicare Other | Admitting: Cardiology

## 2012-11-28 ENCOUNTER — Ambulatory Visit (INDEPENDENT_AMBULATORY_CARE_PROVIDER_SITE_OTHER): Payer: Medicare Other | Admitting: Pharmacist

## 2012-11-28 ENCOUNTER — Encounter: Payer: Self-pay | Admitting: Cardiology

## 2012-11-28 ENCOUNTER — Ambulatory Visit (INDEPENDENT_AMBULATORY_CARE_PROVIDER_SITE_OTHER): Payer: Medicare Other | Admitting: Cardiology

## 2012-11-28 VITALS — BP 127/71 | HR 52 | Ht 63.6 in | Wt 158.0 lb

## 2012-11-28 DIAGNOSIS — I4891 Unspecified atrial fibrillation: Secondary | ICD-10-CM

## 2012-11-28 DIAGNOSIS — N189 Chronic kidney disease, unspecified: Secondary | ICD-10-CM

## 2012-11-28 DIAGNOSIS — Z7901 Long term (current) use of anticoagulants: Secondary | ICD-10-CM

## 2012-11-28 DIAGNOSIS — I5022 Chronic systolic (congestive) heart failure: Secondary | ICD-10-CM

## 2012-11-28 DIAGNOSIS — I2510 Atherosclerotic heart disease of native coronary artery without angina pectoris: Secondary | ICD-10-CM

## 2012-11-28 DIAGNOSIS — I059 Rheumatic mitral valve disease, unspecified: Secondary | ICD-10-CM

## 2012-11-28 DIAGNOSIS — I509 Heart failure, unspecified: Secondary | ICD-10-CM

## 2012-11-28 DIAGNOSIS — I340 Nonrheumatic mitral (valve) insufficiency: Secondary | ICD-10-CM

## 2012-11-28 LAB — POCT INR: INR: 2.2

## 2012-11-28 MED ORDER — HYDRALAZINE HCL 25 MG PO TABS: 25.0000 mg | ORAL_TABLET | Freq: Three times a day (TID) | ORAL | Status: DC

## 2012-11-28 NOTE — Patient Instructions (Addendum)
Increase hydralazine to 25mg  three times a day.  Your physician recommends that you have lab work --BMET/BNP/Liver profile/TSH. I have given you an order for this. Please fax the results to [phone] Dr [name].   Your physician recommends that you schedule a follow-up appointment in: 3 months. This is scheduled for Wednesday March 12,2014 at 11am.

## 2012-11-28 NOTE — Progress Notes (Signed)
Patient ID: [name] [name], male   DOB: [birth year], 78 y.o.   MRN: [id number] PCP: Dr. Linford Arnold  78 yo with history of CAD s/p CABG, ulcerative colitis s/p colectomy, and CHF presents for cardiology followup.  He had CABG in 10/11 complicated by post-operative atrial fibrillation.  He was started on amiodarone while in the hospital after CABG and this was continued due to risk of further atrial fibrillation in the setting of significantly enlarged left atrium.  He was admitted to the hospital in [place] in 7/12 with a diastolic CHF exacerbation and was diuresed.  I had him stop amiodarone in 2012 given no atrial fibrillation recurrence.    In 7/13, he began to note increased exertional dyspnea.  He went to the hospital in [place] in 7/13 with a diastolic CHF exacerbation.  I am not sure if he was in atrial fibrillation at that time as I have no records of that hospitalization.  It sounds like he was only in the hospital for 2-3 days.  He had an echo done there with EF 35-40%, severe MR, PA systolic pressure 50-60 mmHg.   At a prior appointment, Mr Bisig was in atrial fibrillation with RVR.  I started him on coumadin (NOACs not covered by his insurance) and Toprol XL.  I sent him for TEE-guided cardioversion on amiodarone (INR did not stay therapeutic throught the pre-DCCV period so needed TEE).  He converted to NSR.  I did admit him after cardioversion in 9/13 for diuresis.  TEE prior to DCCV confirmed EF 35-40% with severe, post-infarction MR in the setting of severe hypokinesis to akinesis of the inferior and posterior walls. Repeat ECG in NSR in 11/13 showed EF 45-50% with severe MR.    Mr Pacholski seems to be doing well overall.  He is doing his 1.5 mile walk several times a week without exertional dyspnea.  No chest pain.  No orthopnea or PND.  Weight is stable.  He is in NSR today and has felt no tachypalpitations.   Labs (9/12): LDL 81, HDL 54, K 4.2, creatinine 1.9, BNP 1065 Labs (11/12):  K 4.1, creatinine 1.7, BNP 837 Labs (1/13): K 3.7, creatinine 1.6, HDL 52, LDL 100 Labs (5/13): K 4.4, creatinine 1.5, HDL 47, LDL 76 Labs (7/13): creatinine 2.0 Labs (8/13): K 3.9, creatinine 1.9, BNP 1066 Labs (9/13): K 3.5, creatinine 2.4=>2.3, BNP 971, LFTs normal, TSH normal Labs (10/13): K 4.6, creatinine 1.87 Labs (11/13): K 4.5, creatinine 2.4, LDL 73, HDL 50  PMH: 1. Paroxysmal atrial fibrillation: Noted only post-op CABG in 10/11.  Was not put on coumadin.  Initially on amiodarone but this was stopped. Back in atrial fibrillation in 8/13, amiodarone restarted along with coumadin.  TEE-guided DCCV in 9/13.   2. Knee osteoarthritis 3. Anemia of uncertain etiology: had erythropoietin shots for a period of time.  4. CAD: MI at age 89.  Had CABG in 10/11 with LIMA-LAD, SVG-D, and SVG-RCA.  Lexiscan myoview (9/13) with EF 34%, large inferior infarct from base to apex with no ischemia.   5. Systolic CHF: Echo (1/12) with EF 50%, inferior and inferoseptal hypokinesis, mild LV hypertrophy, moderate MR, severe left atrial enlargement.  Echo [facility], 7/13): EF 35-40%, severe MR, moderate-severe LAE, PASP 50-60 mmHg.  TEE (9/13): EF 35-40%, posterior and inferior severe HK to AK, severe post-infarction MR, mildly decreased RV systolic function. Echo (11/13) with EF 45-50%, LV dilation, severe MR, mild RV dilation with moderate RV dysfunction.  6. Ulcerative colitis: s/p total  colectomy with ileostomy.  7. HTN 8. Hyperlipidemia 9. Nephrolithiasis 10. CKD: H/o ARF/hyperkalemia in the setting of dehydration.  He is off ACEI and spironolactone.  11. MR: Severe on 7/13 echo and 9/13 TEE.  Suspect post-infarction MR in the setting of inferoposterior wall motion abnormality.   SH: Retired from [employer].  Married, lives in [place]. Stopped smoking in 1970.    FH: Strong history of CAD.  Father with MI at 30.  All siblings (11) have now passed away, many with heart disease.   ROS: All  systems reviewed and negative except as per HPI.   Current Outpatient Prescriptions  Medication Sig Dispense Refill  . acetaminophen (TYLENOL) 650 MG CR tablet Take 650 mg by mouth every 8 (eight) hours as needed. arthritis      . amiodarone (PACERONE) 200 MG tablet Take 1 tablet (200 mg total) by mouth daily.  90 tablet  3  . aspirin 81 MG tablet Take 81 mg by mouth daily.      [name] atorvastatin (LIPITOR) 40 MG tablet Take 40 mg by mouth daily.      . carvedilol (COREG) 6.25 MG tablet Take 1 tablet (6.25 mg total) by mouth 2 (two) times daily.  60 tablet  3  . Cholecalciferol (VITAMIN D-3 PO) Take 1,000 Units by mouth daily.       . ferrous sulfate 325 (65 FE) MG tablet Take 325 mg by mouth every evening.      . fish oil-omega-3 fatty acids 1000 MG capsule Take 2 g by mouth daily.       [name] FLAXSEED, LINSEED, PO Take 1 tablet by mouth every evening.      . folic acid (FOLVITE) 400 MCG tablet Take 400 mcg by mouth daily.       . furosemide (LASIX) 40 MG tablet Take 60 mg by mouth daily.      . hydrALAZINE (APRESOLINE) 25 MG tablet Take 0.5 tablets (12.5 mg total) by mouth 3 (three) times daily.  45 tablet  3  . isosorbide mononitrate (IMDUR) 30 MG 24 hr tablet Take 30 mg by mouth daily.      . Multiple Vitamin (MULITIVITAMIN WITH MINERALS) TABS Take 1 tablet by mouth daily.      . Multiple Vitamins-Minerals (ICAPS AREDS FORMULA PO) Take 1 capsule by mouth every evening.      . nitroGLYCERIN (NITROSTAT) 0.4 MG SL tablet Place 0.4 mg under the tongue every 5 (five) minutes as needed. Chest pain      . potassium chloride SA (K-DUR,KLOR-CON) 20 MEQ tablet Take 1 tablet (20 mEq total) by mouth daily.  90 tablet  3  . Tamsulosin HCl (FLOMAX) 0.4 MG CAPS Take 0.4 mg by mouth every evening.      . warfarin (COUMADIN) 5 MG tablet Take 2.5-5 mg by mouth daily. Takes half tablet on Tuesday, Thursday and Saturday Takes whole tablet on Monday, Wednesday, Friday and Sunday      . [DISCONTINUED] pantoprazole  (PROTONIX) 40 MG tablet Take 40 mg by mouth daily.          BP 127/71  Pulse 52  Ht 5' 3.6" (1.615 m)  Wt 158 lb (71.668 kg)  BMI 27.46 kg/m2 General: NAD Neck: JVP not elevated, no thyromegaly or thyroid nodule.  Lungs: Clear to auscultation bilaterally with normal respiratory effort. CV: Nondisplaced PMI.  Heart irregular S1/S2, no S3/S4, 2/6 HSM at apex. 1+ ankle edema bilaterally.  No carotid bruit.  Abdomen: Soft, nontender, no hepatosplenomegaly, no  distention.  Neurologic: Alert and oriented x 3.  Psych: Normal affect. Extremities: No clubbing or cyanosis.   Assessment/Plan  1. Atrial fibrillation  Patient has been cardioverted to NSR and remains in NSR today on amiodarone.   It is possible that his cardiomyopathy is partly tachycardia-mediated.  - Continue coumadin, Coreg, amiodarone.  - TSH and LFTs to be repeated in 1/14.  He will need yearly eye exam. He has had baseline PFTs with amiodarone use.   2. Chronic systolic CHF (congestive heart failure)  EF 45-50% on most recent echo while in NSR.  Cannot rule out component of tachy-mediated cardiomyopathy but suspect ischemic cardiomyopathy plays major role. He denies chest pain.  NYHA class II.   - He is off spironolactone and ACEI after recent ARF with hyperkalemia.  - He is now taking hydralazine and Imdur => increase hydralazine to 25 mg tid today for afterload reduction, will continue to try to titrate this.   - Continue Coreg.  - Lasix has been decreased to 40 mg daily.  He will take this dose but if he gains 2 lbs in a day or 3 lbs in a week, he will take Lasix 40 mg bid x 3 days.  He weighs daily.    - BMET in 1/14.  3. HYPERLIPIDEMIA  Continue atorvastatin 40 with known CAD.   4. Mitral regurgitation  Severe on recent TEE.  This appeared to be post-infarction MR with inferoposterior severe hypokinesis to akinesis.  We have talked at length about surgery to correct the MR.  He would be a difficult candidate for MVR  given prior sternotomy and significant CKD.   He does not want to consider redo heart surgery.  I think this is reasonable given the risk and will try to manage him as well as we can medically.  5. CAD  Status post CABG.  Lexiscan myoview showed dense inferior scar with no ischemia.  Continue medical management.  He can stop ASA as he is also taking warfarin.  6. CKD (chronic kidney disease) BMET.  Will need to follow creatinine closely.    [name] 11/28/2012

## 2012-12-02 ENCOUNTER — Other Ambulatory Visit: Payer: Self-pay | Admitting: *Deleted

## 2012-12-02 NOTE — Telephone Encounter (Signed)
Patient requested a written script for warfarin to take to [facility] in [place], [place], will mail to patient

## 2012-12-26 ENCOUNTER — Ambulatory Visit: Payer: Medicare Other

## 2012-12-30 ENCOUNTER — Telehealth: Payer: Self-pay | Admitting: Family Medicine

## 2012-12-30 NOTE — Telephone Encounter (Signed)
Patient came by and request to start having his coumadin and blood work done here at our office instead of at [facility]. I scheduled him for 01/03/13 at 8:30am. Thanks

## 2013-01-03 ENCOUNTER — Other Ambulatory Visit: Payer: Self-pay | Admitting: *Deleted

## 2013-01-03 ENCOUNTER — Ambulatory Visit (INDEPENDENT_AMBULATORY_CARE_PROVIDER_SITE_OTHER): Payer: Medicare Other | Admitting: Family Medicine

## 2013-01-03 VITALS — BP 70/40 | HR 40

## 2013-01-03 DIAGNOSIS — I4891 Unspecified atrial fibrillation: Secondary | ICD-10-CM

## 2013-01-03 DIAGNOSIS — Z7901 Long term (current) use of anticoagulants: Secondary | ICD-10-CM

## 2013-01-03 LAB — POCT INR: INR: 2.5

## 2013-01-03 MED ORDER — WARFARIN SODIUM 5 MG PO TABS: 2.5000 mg | ORAL_TABLET | Freq: Every day | ORAL | Status: DC

## 2013-01-03 NOTE — Progress Notes (Signed)
  Subjective:    Patient ID: [name] [name], male    DOB: [birth year], 79 y.o.   MRN: [id number] Pt denies any excessive bruising or bleeding. 5 mins spent with pt. Pt states no dizziness, took all meds this morning HPI    Review of Systems     Objective:   Physical Exam        Assessment & Plan:  Hypotension - Stop the hydralazine and lasix.  F/u on Monday to recheck BP. May sure drinking plenty of fluids over the weekend.  [name], MD \

## 2013-01-06 ENCOUNTER — Ambulatory Visit (INDEPENDENT_AMBULATORY_CARE_PROVIDER_SITE_OTHER): Payer: Medicare Other | Admitting: Family Medicine

## 2013-01-06 VITALS — BP 100/56 | HR 58

## 2013-01-06 DIAGNOSIS — I959 Hypotension, unspecified: Secondary | ICD-10-CM

## 2013-01-06 NOTE — Progress Notes (Signed)
  Subjective:    Patient ID: [name] [name], male    DOB: 1930/01/03, 79 y.o.   MRN: [id number] Pt denies CP, SOB, dizziness, or heart palpitations. taking meds as directed without problems. Denies med side effects. 5 min spent with pt.  Pt states today while in office that he is concerned that you stopped the fluid pill because he retains fluid- says his ankles swollen some today. Examined pt ankles and feet and no visual swelling seen in the lower extremities. States he has been in the hospital 2 times for retaining fluid HPI    Review of Systems     Objective:   Physical Exam        Assessment & Plan:  Hypotension-if he's very concerned about this I did have him restart his Lasix twice a week. I think every day is way too much. Then a followup appointment with me in 2-3 weeks. [name], MD

## 2013-01-06 NOTE — Progress Notes (Signed)
  Subjective:    Patient ID: [name] [name], male    DOB: 1930/03/07, 79 y.o.   MRN: [id number]  HPI    Review of Systems     Objective:   Physical Exam        Assessment & Plan:  Pt notified via VM and instructed to call back if any questions. Per pt was instructed to leave a message as his wifes brother passed away and he would not be at home. KG LPN

## 2013-01-23 ENCOUNTER — Ambulatory Visit: Payer: Self-pay | Admitting: Family Medicine

## 2013-01-23 ENCOUNTER — Ambulatory Visit (INDEPENDENT_AMBULATORY_CARE_PROVIDER_SITE_OTHER): Payer: Medicare Other | Admitting: Family Medicine

## 2013-01-23 ENCOUNTER — Encounter: Payer: Self-pay | Admitting: Family Medicine

## 2013-01-23 VITALS — BP 106/58 | HR 57 | Wt 164.0 lb

## 2013-01-23 DIAGNOSIS — R2231 Localized swelling, mass and lump, right upper limb: Secondary | ICD-10-CM

## 2013-01-23 DIAGNOSIS — I4891 Unspecified atrial fibrillation: Secondary | ICD-10-CM

## 2013-01-23 DIAGNOSIS — I10 Essential (primary) hypertension: Secondary | ICD-10-CM

## 2013-01-23 DIAGNOSIS — I5022 Chronic systolic (congestive) heart failure: Secondary | ICD-10-CM

## 2013-01-23 DIAGNOSIS — Z7901 Long term (current) use of anticoagulants: Secondary | ICD-10-CM

## 2013-01-23 DIAGNOSIS — L570 Actinic keratosis: Secondary | ICD-10-CM

## 2013-01-23 DIAGNOSIS — R229 Localized swelling, mass and lump, unspecified: Secondary | ICD-10-CM

## 2013-01-23 LAB — POCT INR: INR: 3.1

## 2013-01-23 NOTE — Progress Notes (Signed)
  Subjective:    Patient ID: [name] [name], male    DOB: [birth year], 79 y.o.   MRN: [id number]  HPI HTN-  Pt denies chest pain, SOB, dizziness, or heart palpitations.  Taking meds as directed w/o problems.  Denies medication side effects.  Says taking half tab lasix daily.  He was supposed to decrease to twice a week but didn't.  Recently had an episode of dehydration do to do much of his diuretic.  Complaint of not on his first finger on his right hand. He says his been there for couple years. It really doesn't bother him much but he feels it has been getting larger. He says it only hurts if the weather changes. Or if he hit it on something. No drainage. He does have a history of gout I did not initially his medical record.  He also has lesions on skin and head he wasn't to ask about today. Does have a history of sun exposure.  Review of Systems     Objective:   Physical Exam  Constitutional: He is oriented to person, place, and time. He appears well-developed and well-nourished.  HENT:  Head: Normocephalic and atraumatic.  Cardiovascular: Normal rate, regular rhythm and normal heart sounds.   Pulmonary/Chest: Effort normal and breath sounds normal.  Neurological: He is alert and oriented to person, place, and time.  Skin: Skin is warm and dry.       He has multiple large actinic keratoses on his scalp and face.  Psychiatric: He has a normal mood and affect. His behavior is normal.          Assessment & Plan:  HTN- Decrease lasix to hafl tab every other day.  F/U in 3 weeks.  Recheck BMP and fasting lipid panel today. He currently taking 20mg  daily (half of 40mg  tab). I explained to him there is a fine balance between him getting too much fluid overload especially with his history of congestive heart failure versus becoming dehydrated with too much Lasix.  Encouraged shingles vaccine. Given H.O to see if wants to get it.  Then did think about it.  CAD- Cards told him to stop ASA  since on coumadin. I will remove aspirin from his medication list.  Knot  on finger-it almost looks like a tophi which is possible with his history of gout. I would like to send him to a orthopedic hand surgeon for further evaluation.  Atrial fibrillation-see if regulation flowsheet for adjustments. He is supratherapeutic today.  Actinic keratoses-recommend followup for cryotherapy. I encouraged him to make a separate appointment for this and we can certainly treat his lesions. Consider biopsy of the one on his right cheek near his jaw line.

## 2013-02-03 ENCOUNTER — Encounter: Payer: Self-pay | Admitting: Family Medicine

## 2013-02-03 DIAGNOSIS — M1A9XX1 Chronic gout, unspecified, with tophus (tophi): Secondary | ICD-10-CM | POA: Insufficient documentation

## 2013-02-03 DIAGNOSIS — M109 Gout, unspecified: Secondary | ICD-10-CM | POA: Insufficient documentation

## 2013-02-06 ENCOUNTER — Ambulatory Visit: Payer: Medicare Other | Admitting: Family Medicine

## 2013-02-13 ENCOUNTER — Ambulatory Visit: Payer: Medicare Other | Admitting: Family Medicine

## 2013-02-20 ENCOUNTER — Encounter: Payer: Self-pay | Admitting: Family Medicine

## 2013-02-20 ENCOUNTER — Ambulatory Visit: Payer: Self-pay | Admitting: Family Medicine

## 2013-02-20 ENCOUNTER — Ambulatory Visit (INDEPENDENT_AMBULATORY_CARE_PROVIDER_SITE_OTHER): Payer: Medicare Other | Admitting: Family Medicine

## 2013-02-20 VITALS — BP 106/57 | HR 49 | Ht 63.0 in | Wt 168.0 lb

## 2013-02-20 DIAGNOSIS — I4891 Unspecified atrial fibrillation: Secondary | ICD-10-CM

## 2013-02-20 MED ORDER — COLCHICINE 0.6 MG PO TABS: 0.6000 mg | ORAL_TABLET | Freq: Every day | ORAL | Status: DC

## 2013-02-20 MED ORDER — DIPHENOXYLATE-ATROPINE 2.5-0.025 MG PO TABS: 1.0000 | ORAL_TABLET | Freq: Four times a day (QID) | ORAL | Status: DC

## 2013-02-20 MED ORDER — ALLOPURINOL 100 MG PO TABS: 100.0000 mg | ORAL_TABLET | Freq: Every day | ORAL | Status: DC

## 2013-02-20 NOTE — Progress Notes (Signed)
  Subjective:    Patient ID: [name] [name], male    DOB: 04-Nov-1930, 79 y.o.   MRN: [id number]  HPI Here for cryotherapy for his actinic keratoses on his face and scalp. Several of them have been there for years other start larger. He says that in the pill often come right back. He says he really tries not to pick at them but says he can't help himself. Next  Atrial fibrillation-he's doing well without any chest pain or shortness of breath. Due for Coumadin/INR check today.  Gout-he says he would like a refill on colchicine. We have no written a prescription for him before. He says he gets it at a pharmacy in [place]. He would also like a refill prescription sent for Lomotil for diarrhea. Again we have never written this prescription for him before but he was getting it from another provider. He has not been on, or is currently on, any prophylactic therapy for his gout.  Review of Systems     Objective:   Physical Exam  Constitutional: He appears well-developed and well-nourished.  HENT:  Head: Normocephalic and atraumatic.  Musculoskeletal:  He has a tophi on his first finger on his right hand.  Skin: Skin is warm and dry.  He has diffuse and scattered actinic keratoses on his face, cheeks, nasal bridge, scalp. He has mostly central hair loss.  Psychiatric: He has a normal mood and affect.          Assessment & Plan:  Actinic keratoses-cryotherapy performed. Patient tolerated well. Follow up wound care discussed. Return in one month for any lesions that return.  Atrial fibrillation-see endocrine relationship flowsheet for adjustments.  Gout-I. did refill the colchicine. Explained to him that he really needs to be on something prophylactic to prevent destruction of the joints over time. Recommend low-dose allopurinol because of his chronic kidney disease. 100 mg once a day. Prescription presented. Next  Diarrhea-prescription printed for Lomotil.  Cryotherapy Procedure  Note  Pre-operative Diagnosis: Actinic keratosis  Post-operative Diagnosis: Actinic keratosis  Locations: face, nasal bridge, cheeks, ears, scalp, dorsum of hands     Indications: Pre-cancerous skin lesions  Anesthesia: not required    Procedure Details  Patient informed of risks (permanent scarring, infection, light or dark discoloration, bleeding, infection, weakness, numbness and recurrence of the lesion) and benefits of the procedure and verbal informed consent obtained.  The areas are treated with liquid nitrogen therapy, frozen until ice ball extended 2-3 mm beyond lesion, allowed to thaw, and treated again. The patient tolerated procedure well.  The patient was instructed on post-op care, warned that there may be blister formation, redness and pain. Recommend OTC analgesia as needed for pain.  Condition: Stable  Complications: none.  Plan: 1. Instructed to keep the area dry and covered for 24-48h and clean thereafter. 2. Warning signs of infection were reviewed.   3. Recommended that the patient use OTC acetaminophen as needed for pain.  4. Return prn.

## 2013-02-20 NOTE — Patient Instructions (Addendum)
Can re-freeze any resistant areas in about a month.

## 2013-03-03 ENCOUNTER — Other Ambulatory Visit: Payer: Self-pay | Admitting: Nurse Practitioner

## 2013-03-05 ENCOUNTER — Ambulatory Visit (INDEPENDENT_AMBULATORY_CARE_PROVIDER_SITE_OTHER): Payer: Medicare Other | Admitting: Cardiology

## 2013-03-05 ENCOUNTER — Encounter: Payer: Self-pay | Admitting: Cardiology

## 2013-03-05 ENCOUNTER — Telehealth: Payer: Self-pay | Admitting: Cardiology

## 2013-03-05 VITALS — BP 126/66 | HR 47 | Ht 63.0 in | Wt 168.0 lb

## 2013-03-05 DIAGNOSIS — I4891 Unspecified atrial fibrillation: Secondary | ICD-10-CM

## 2013-03-05 DIAGNOSIS — I509 Heart failure, unspecified: Secondary | ICD-10-CM

## 2013-03-05 DIAGNOSIS — I5032 Chronic diastolic (congestive) heart failure: Secondary | ICD-10-CM

## 2013-03-05 LAB — BASIC METABOLIC PANEL
BUN: 47 mg/dL — ABNORMAL HIGH (ref 6–23)
Chloride: 105 mEq/L (ref 96–112)
Creatinine, Ser: 3.1 mg/dL — ABNORMAL HIGH (ref 0.4–1.5)

## 2013-03-05 LAB — HEPATIC FUNCTION PANEL
AST: 34 U/L (ref 0–37)
Albumin: 2.6 g/dL — ABNORMAL LOW (ref 3.5–5.2)
Alkaline Phosphatase: 62 U/L (ref 39–117)

## 2013-03-05 LAB — TSH: TSH: 1.39 u[IU]/mL (ref 0.35–5.50)

## 2013-03-05 MED ORDER — AMIODARONE HCL 200 MG PO TABS: ORAL_TABLET | ORAL | Status: DC

## 2013-03-05 MED ORDER — HYDRALAZINE HCL 25 MG PO TABS: ORAL_TABLET | ORAL | Status: DC

## 2013-03-05 MED ORDER — ISOSORBIDE MONONITRATE ER 60 MG PO TB24: 60.0000 mg | ORAL_TABLET | Freq: Every day | ORAL | Status: DC

## 2013-03-05 NOTE — Progress Notes (Signed)
Patient ID: [name] [name], male   DOB: [birth year], 79 y.o.   MRN: [id number] PCP: Dr. Linford Arnold  79 yo with history of CAD s/p CABG, ulcerative colitis s/p colectomy, and CHF presents for cardiology followup.  He had CABG in 10/11 complicated by post-operative atrial fibrillation.  He was started on amiodarone while in the hospital after CABG and this was continued due to risk of further atrial fibrillation in the setting of significantly enlarged left atrium.  He was admitted to the hospital in [place] in 7/12 with a diastolic CHF exacerbation and was diuresed.  I had him stop amiodarone in 2012 given no atrial fibrillation recurrence.    In 7/13, he began to note increased exertional dyspnea.  He went to the hospital in [place] in 7/13 with a diastolic CHF exacerbation.  I am not sure if he was in atrial fibrillation at that time as I have no records of that hospitalization.  It sounds like he was only in the hospital for 2-3 days.  He had an echo done there with EF 35-40%, severe MR, PA systolic pressure 50-60 mmHg.   At a prior appointment, [name] [name] was in atrial fibrillation with RVR.  I started him on coumadin (NOACs not covered by his insurance) and Toprol XL.  I sent him for TEE-guided cardioversion on amiodarone (INR did not stay therapeutic throught the pre-DCCV period so needed TEE).  He converted to NSR.  I did admit him after cardioversion in 9/13 for diuresis.  TEE prior to DCCV confirmed EF 35-40% with severe, post-infarction [name] in the setting of severe hypokinesis to akinesis of the inferior and posterior walls. Repeat ECG in NSR in 11/13 showed EF 45-50% with severe [name].    [name] [name] seems to be doing well overall.  He is doing his 1.5 mile walk several times a week without exertional dyspnea.  No chest pain.  No orthopnea or PND.  Weight is up but he reports eating more over the winter.  He is in NSR today and has felt no tachypalpitations and lightheadedness, but HR is in the 40s.   He says that his HR gets up to around 100 with exercise.  Labs (9/12): LDL 81, HDL 54, K 4.2, creatinine 1.9, BNP 1065 Labs (11/12): K 4.1, creatinine 1.7, BNP 837 Labs (1/13): K 3.7, creatinine 1.6, HDL 52, LDL 100 Labs (5/13): K 4.4, creatinine 1.5, HDL 47, LDL 76 Labs (7/13): creatinine 2.0 Labs (8/13): K 3.9, creatinine 1.9, BNP 1066 Labs (9/13): K 3.5, creatinine 2.4=>2.3, BNP 971, LFTs normal, TSH normal Labs (10/13): K 4.6, creatinine 1.87 Labs (11/13): K 4.5, creatinine 2.4, LDL 73, HDL 50  ECG: NSR at 47, RBBB, old inferior MI, QTc prolonged to 506.   PMH: 1. Paroxysmal atrial fibrillation: Noted only post-op CABG in 10/11.  Was not put on coumadin.  Initially on amiodarone but this was stopped. Back in atrial fibrillation in 8/13, amiodarone restarted along with coumadin.  TEE-guided DCCV in 9/13.   2. Knee osteoarthritis 3. Anemia of uncertain etiology: had erythropoietin shots for a period of time.  4. CAD: MI at age 37.  Had CABG in 10/11 with LIMA-LAD, SVG-D, and SVG-RCA.  Lexiscan myoview (9/13) with EF 34%, large inferior infarct from base to apex with no ischemia.   5. Systolic CHF: Echo (1/12) with EF 50%, inferior and inferoseptal hypokinesis, mild LV hypertrophy, moderate [name], severe left atrial enlargement.  Echo [facility], 7/13): EF 35-40%, severe [name], moderate-severe LAE, PASP 50-60 mmHg.  TEE (9/13): EF 35-40%, posterior and inferior severe HK to AK, severe post-infarction [name], mildly decreased RV systolic function. Echo (11/13) with EF 45-50%, LV dilation, severe [name], mild RV dilation with moderate RV dysfunction.  6. Ulcerative colitis: s/p total colectomy with ileostomy.  7. HTN 8. Hyperlipidemia 9. Nephrolithiasis 10. CKD: H/o ARF/hyperkalemia in the setting of dehydration.  He is off ACEI and spironolactone.  11. [name]: Severe on 7/13 echo and 9/13 TEE.  Suspect post-infarction [name] in the setting of inferoposterior wall motion abnormality.  12. Gout  SH:  Retired from [employer].  Married, lives in [place]. Stopped smoking in 1970.    FH: Strong history of CAD.  Father with MI at 74.  All siblings (11) have now passed away, many with heart disease.   ROS: All systems reviewed and negative except as per HPI.   Current Outpatient Prescriptions  Medication Sig Dispense Refill  . acetaminophen (TYLENOL) 650 MG CR tablet Take 650 mg by mouth every 8 (eight) hours as needed. arthritis      . allopurinol (ZYLOPRIM) 100 MG tablet Take 1 tablet (100 mg total) by mouth daily.  90 tablet  1  . amiodarone (PACERONE) 200 MG tablet Take 1/2 of a 200 mg tablet daily  90 tablet  3  . atorvastatin (LIPITOR) 40 MG tablet Take 40 mg by mouth daily.      . carvedilol (COREG) 6.25 MG tablet TAKE 1 TABLET BY MOUTH TWICE A DAY  60 tablet  3  . Cholecalciferol (VITAMIN D-3 PO) Take 1,000 Units by mouth daily.       . colchicine 0.6 MG tablet Take 1 tablet (0.6 mg total) by mouth daily.  90 tablet  1  . diphenoxylate-atropine (LOMOTIL) 2.5-0.025 MG per tablet Take 1 tablet by mouth 4 (four) times daily.  90 tablet  1  . ferrous sulfate 325 (65 FE) MG tablet Take 325 mg by mouth every evening.      . fish oil-omega-3 fatty acids 1000 MG capsule Take 2 g by mouth daily.       [name] FLAXSEED, LINSEED, PO Take 1 tablet by mouth every evening.      . folic acid (FOLVITE) 400 MCG tablet Take 400 mcg by mouth daily.       . furosemide (LASIX) 40 MG tablet Take 20 mg by mouth daily.       . hydrALAZINE (APRESOLINE) 25 MG tablet Take one and 1/2 tablets three times per day.  405 tablet  3  . Multiple Vitamin (MULITIVITAMIN WITH MINERALS) TABS Take 1 tablet by mouth daily.      . Multiple Vitamins-Minerals (ICAPS AREDS FORMULA PO) Take 1 capsule by mouth every evening.      . nitroGLYCERIN (NITROSTAT) 0.4 MG SL tablet Place 0.4 mg under the tongue every 5 (five) minutes as needed. Chest pain      . potassium chloride SA (K-DUR,KLOR-CON) 20 MEQ tablet Take 1 tablet (20 mEq total)  by mouth daily.  90 tablet  3  . Tamsulosin HCl (FLOMAX) 0.4 MG CAPS Take 0.4 mg by mouth every evening.      . warfarin (COUMADIN) 5 MG tablet Take 0.5-1 tablets (2.5-5 mg total) by mouth daily. Takes half tablet on Tuesday, Thursday and Saturday Takes whole tablet on Monday, Wednesday, Friday and Sunday  90 tablet  3  . isosorbide mononitrate (IMDUR) 60 MG 24 hr tablet Take 1 tablet (60 mg total) by mouth daily.  90 tablet  3  . [  DISCONTINUED] pantoprazole (PROTONIX) 40 MG tablet Take 40 mg by mouth daily.         No current facility-administered medications for this visit.    BP 126/66  Pulse 47  Ht 5\' 3"  (1.6 m)  Wt 168 lb (76.204 kg)  BMI 29.77 kg/m2 General: NAD Neck: JVP not elevated, no thyromegaly or thyroid nodule.  Lungs: Clear to auscultation bilaterally with normal respiratory effort. CV: Nondisplaced PMI.  Heart irregular S1/S2, no S3/S4, 2/6 HSM at apex. No edema.  No carotid bruit.  Abdomen: Soft, nontender, no hepatosplenomegaly, no distention.  Neurologic: Alert and oriented x 3.  Psych: Normal affect. Extremities: No clubbing or cyanosis.   Assessment/Plan  1. Atrial fibrillation  Patient has been cardioverted to NSR and remains in NSR today on amiodarone.   It is possible that his cardiomyopathy is partly tachycardia-mediated.  - Continue coumadin, Coreg, amiodarone.  Given sinus bradycardia in the 40s, in will decrease amiodarone to 100 mg daily.   - TSH and LFTs today.  He will need yearly eye exam. He has had baseline PFTs with amiodarone use.   2. Chronic systolic CHF (congestive heart failure)  EF 45-50% on most recent echo while in NSR.  Cannot rule out component of tachy-mediated cardiomyopathy but suspect ischemic cardiomyopathy plays major role. He denies chest pain.  NYHA class II.   - He is off spironolactone and ACEI after recent ARF with hyperkalemia and given baseline significantly CKD.  .  - He is now taking hydralazine and Imdur => increase  hydralazine to 37.5 mg tid today for afterload reduction, and increase Imdur to 60 mg daily.   - Continue Coreg.  - Continue current Lasix dosing.      - BMET today.  3. HYPERLIPIDEMIA  Continue atorvastatin 40 with known CAD.   4. Mitral regurgitation  Severe on recent TEE.  This appeared to be post-infarction [name] with inferoposterior severe hypokinesis to akinesis.  We have talked at length about surgery to correct the [name].  He would be a difficult candidate for MVR given prior sternotomy and significant CKD.   He does not want to consider redo heart surgery.  I think this is reasonable given the risk and will try to manage him as well as we can medically.  5. CAD  Status post CABG.  Lexiscan myoview showed dense inferior scar with no ischemia.  Continue medical management.  He can stop ASA as he is also taking warfarin.  6. CKD (chronic kidney disease) BMET.  Will need to follow creatinine closely.    [name] 03/05/2013

## 2013-03-05 NOTE — Telephone Encounter (Signed)
Pt would like lab results.  

## 2013-03-05 NOTE — Patient Instructions (Signed)
Your physician has recommended you make the following change in your medication: Increase you:  * Imdur to 60 mg daily (you can finish your current supply and take 2 30 mg tabs)  *Increase your Hydralazine to 37.5 mg three times per day (take 1 and 1/2 of your 25 mg tabs)  *Decrease your Amiodarone to 100 mg daily (take 1/2 of 200 mg tab)  Your physician recommends that you need lab work today for:  TSH, LFTs, BMET, BNP  Your physician recommends that you schedule a follow-up appointment in: 3 months with Dr. [name]

## 2013-03-05 NOTE — Telephone Encounter (Signed)
Spoke with pt and gave him lab results. He is aware Dr [name] has not reviewed lab results done today.

## 2013-03-06 ENCOUNTER — Ambulatory Visit (INDEPENDENT_AMBULATORY_CARE_PROVIDER_SITE_OTHER): Payer: Medicare Other | Admitting: Family Medicine

## 2013-03-06 VITALS — BP 113/59 | HR 54

## 2013-03-06 DIAGNOSIS — Z7901 Long term (current) use of anticoagulants: Secondary | ICD-10-CM

## 2013-03-06 DIAGNOSIS — I4891 Unspecified atrial fibrillation: Secondary | ICD-10-CM

## 2013-03-06 LAB — POCT INR: INR: 2.4

## 2013-03-06 NOTE — Progress Notes (Signed)
  Subjective:    Patient ID: [name] [name], male    DOB: 06-11-1930, 79 y.o.   MRN: [id number] Pt denies any excessive bruising or bleeding. 5 mins spent with pt.  HPI    Review of Systems     Objective:   Physical Exam        Assessment & Plan:

## 2013-03-07 ENCOUNTER — Telehealth: Payer: Self-pay | Admitting: Cardiology

## 2013-03-07 ENCOUNTER — Other Ambulatory Visit: Payer: Self-pay | Admitting: *Deleted

## 2013-03-07 NOTE — Telephone Encounter (Signed)
pt calling re a rx that was called in for isosorbide , he was taking 30 but 60 was called in, didn't remember dr [name] saying he was increasing it, pls call

## 2013-03-07 NOTE — Telephone Encounter (Signed)
Spoke with patient. He is aware Dr [name] increased Imdur at time of office visit 03/05/13.

## 2013-03-18 ENCOUNTER — Telehealth: Payer: Self-pay | Admitting: *Deleted

## 2013-03-18 NOTE — Telephone Encounter (Signed)
[name] [name] is scheduled to see Dr. Kathrene Bongo 04/07/13 @ 4pm. Patient is aware.

## 2013-03-27 ENCOUNTER — Ambulatory Visit (INDEPENDENT_AMBULATORY_CARE_PROVIDER_SITE_OTHER): Payer: Medicare Other | Admitting: Family Medicine

## 2013-03-27 VITALS — BP 106/58 | HR 63

## 2013-03-27 DIAGNOSIS — Z7901 Long term (current) use of anticoagulants: Secondary | ICD-10-CM

## 2013-03-27 DIAGNOSIS — I4891 Unspecified atrial fibrillation: Secondary | ICD-10-CM

## 2013-04-15 ENCOUNTER — Other Ambulatory Visit (HOSPITAL_COMMUNITY): Payer: Self-pay | Admitting: *Deleted

## 2013-04-16 ENCOUNTER — Encounter (HOSPITAL_COMMUNITY)
Admission: RE | Admit: 2013-04-16 | Discharge: 2013-04-16 | Disposition: A | Discharge status: Home / Self Care | Payer: Medicare Other | Source: Ambulatory Visit | Attending: Nephrology | Admitting: Nephrology

## 2013-04-16 DIAGNOSIS — I4891 Unspecified atrial fibrillation: Secondary | ICD-10-CM | POA: Insufficient documentation

## 2013-04-16 DIAGNOSIS — I059 Rheumatic mitral valve disease, unspecified: Secondary | ICD-10-CM | POA: Insufficient documentation

## 2013-04-16 DIAGNOSIS — Z7901 Long term (current) use of anticoagulants: Secondary | ICD-10-CM | POA: Insufficient documentation

## 2013-04-16 DIAGNOSIS — D649 Anemia, unspecified: Secondary | ICD-10-CM | POA: Insufficient documentation

## 2013-04-16 MED ORDER — SODIUM CHLORIDE 0.9 % IV SOLN
INTRAVENOUS | Status: DC
  Administered 2013-04-16: 250 mL via INTRAVENOUS

## 2013-04-16 MED ORDER — FERUMOXYTOL INJECTION 510 MG/17 ML: 510.0000 mg | INTRAVENOUS | Status: DC

## 2013-04-16 MED ORDER — FERUMOXYTOL INJECTION 510 MG/17 ML
INTRAVENOUS | Status: AC
  Administered 2013-04-16: 510 mg via INTRAVENOUS
  Filled 2013-04-16: qty 17

## 2013-04-24 ENCOUNTER — Ambulatory Visit (INDEPENDENT_AMBULATORY_CARE_PROVIDER_SITE_OTHER): Payer: Medicare Other | Admitting: Family Medicine

## 2013-04-24 VITALS — BP 105/63 | HR 60

## 2013-04-24 DIAGNOSIS — Z7901 Long term (current) use of anticoagulants: Secondary | ICD-10-CM

## 2013-04-24 DIAGNOSIS — I4891 Unspecified atrial fibrillation: Secondary | ICD-10-CM

## 2013-04-24 LAB — POCT INR: INR: 2.3

## 2013-04-25 ENCOUNTER — Telehealth: Payer: Self-pay | Admitting: *Deleted

## 2013-04-25 NOTE — Telephone Encounter (Signed)
Called pt lvm with Dr. Shelah Lewandowsky dosing for coumadin: Same dosage: 1 tablet Wednesday. Half tab all other days. Recheck in 4 weeks.[name] KitchenMarland KitchenLoralee Pacas Addison

## 2013-04-30 ENCOUNTER — Encounter (HOSPITAL_COMMUNITY)
Admission: RE | Admit: 2013-04-30 | Discharge: 2013-04-30 | Disposition: A | Discharge status: Home / Self Care | Payer: Medicare Other | Source: Ambulatory Visit | Attending: Nephrology | Admitting: Nephrology

## 2013-04-30 ENCOUNTER — Inpatient Hospital Stay (HOSPITAL_COMMUNITY)
Admission: EM | Admit: 2013-04-30 | Discharge: 2013-05-01 | DRG: 309 | Disposition: A | Discharge status: Home / Self Care | Payer: Medicare Other | Attending: Internal Medicine | Admitting: Internal Medicine

## 2013-04-30 ENCOUNTER — Emergency Department (HOSPITAL_COMMUNITY): Payer: Medicare Other

## 2013-04-30 ENCOUNTER — Other Ambulatory Visit: Payer: Self-pay

## 2013-04-30 ENCOUNTER — Encounter (HOSPITAL_COMMUNITY): Payer: Self-pay | Admitting: Emergency Medicine

## 2013-04-30 DIAGNOSIS — I129 Hypertensive chronic kidney disease with stage 1 through stage 4 chronic kidney disease, or unspecified chronic kidney disease: Secondary | ICD-10-CM | POA: Diagnosis present

## 2013-04-30 DIAGNOSIS — I739 Peripheral vascular disease, unspecified: Secondary | ICD-10-CM

## 2013-04-30 DIAGNOSIS — Z8719 Personal history of other diseases of the digestive system: Secondary | ICD-10-CM

## 2013-04-30 DIAGNOSIS — I5023 Acute on chronic systolic (congestive) heart failure: Secondary | ICD-10-CM

## 2013-04-30 DIAGNOSIS — N201 Calculus of ureter: Secondary | ICD-10-CM | POA: Diagnosis present

## 2013-04-30 DIAGNOSIS — N189 Chronic kidney disease, unspecified: Secondary | ICD-10-CM

## 2013-04-30 DIAGNOSIS — M109 Gout, unspecified: Secondary | ICD-10-CM

## 2013-04-30 DIAGNOSIS — I10 Essential (primary) hypertension: Secondary | ICD-10-CM | POA: Diagnosis present

## 2013-04-30 DIAGNOSIS — I2510 Atherosclerotic heart disease of native coronary artery without angina pectoris: Secondary | ICD-10-CM

## 2013-04-30 DIAGNOSIS — I059 Rheumatic mitral valve disease, unspecified: Secondary | ICD-10-CM | POA: Insufficient documentation

## 2013-04-30 DIAGNOSIS — M129 Arthropathy, unspecified: Secondary | ICD-10-CM | POA: Diagnosis present

## 2013-04-30 DIAGNOSIS — I509 Heart failure, unspecified: Secondary | ICD-10-CM

## 2013-04-30 DIAGNOSIS — T50905D Adverse effect of unspecified drugs, medicaments and biological substances, subsequent encounter: Secondary | ICD-10-CM

## 2013-04-30 DIAGNOSIS — D509 Iron deficiency anemia, unspecified: Secondary | ICD-10-CM

## 2013-04-30 DIAGNOSIS — N210 Calculus in bladder: Secondary | ICD-10-CM

## 2013-04-30 DIAGNOSIS — T454X5A Adverse effect of iron and its compounds, initial encounter: Secondary | ICD-10-CM | POA: Diagnosis present

## 2013-04-30 DIAGNOSIS — Z87448 Personal history of other diseases of urinary system: Secondary | ICD-10-CM

## 2013-04-30 DIAGNOSIS — E785 Hyperlipidemia, unspecified: Secondary | ICD-10-CM

## 2013-04-30 DIAGNOSIS — I340 Nonrheumatic mitral (valve) insufficiency: Secondary | ICD-10-CM

## 2013-04-30 DIAGNOSIS — Z951 Presence of aortocoronary bypass graft: Secondary | ICD-10-CM

## 2013-04-30 DIAGNOSIS — I480 Paroxysmal atrial fibrillation: Secondary | ICD-10-CM

## 2013-04-30 DIAGNOSIS — N289 Disorder of kidney and ureter, unspecified: Secondary | ICD-10-CM

## 2013-04-30 DIAGNOSIS — T50905A Adverse effect of unspecified drugs, medicaments and biological substances, initial encounter: Secondary | ICD-10-CM

## 2013-04-30 DIAGNOSIS — H3530 Unspecified macular degeneration: Secondary | ICD-10-CM

## 2013-04-30 DIAGNOSIS — N1330 Unspecified hydronephrosis: Secondary | ICD-10-CM

## 2013-04-30 DIAGNOSIS — Z7901 Long term (current) use of anticoagulants: Secondary | ICD-10-CM | POA: Insufficient documentation

## 2013-04-30 DIAGNOSIS — N183 Chronic kidney disease, stage 3 unspecified: Secondary | ICD-10-CM | POA: Diagnosis present

## 2013-04-30 DIAGNOSIS — M1A9XX1 Chronic gout, unspecified, with tophus (tophi): Secondary | ICD-10-CM

## 2013-04-30 DIAGNOSIS — I498 Other specified cardiac arrhythmias: Principal | ICD-10-CM | POA: Diagnosis present

## 2013-04-30 DIAGNOSIS — N179 Acute kidney failure, unspecified: Secondary | ICD-10-CM

## 2013-04-30 DIAGNOSIS — I4891 Unspecified atrial fibrillation: Secondary | ICD-10-CM | POA: Insufficient documentation

## 2013-04-30 DIAGNOSIS — E875 Hyperkalemia: Secondary | ICD-10-CM

## 2013-04-30 DIAGNOSIS — Z87891 Personal history of nicotine dependence: Secondary | ICD-10-CM

## 2013-04-30 DIAGNOSIS — R001 Bradycardia, unspecified: Secondary | ICD-10-CM

## 2013-04-30 DIAGNOSIS — D649 Anemia, unspecified: Secondary | ICD-10-CM

## 2013-04-30 DIAGNOSIS — R55 Syncope and collapse: Secondary | ICD-10-CM

## 2013-04-30 DIAGNOSIS — I5022 Chronic systolic (congestive) heart failure: Secondary | ICD-10-CM

## 2013-04-30 DIAGNOSIS — I2589 Other forms of chronic ischemic heart disease: Secondary | ICD-10-CM | POA: Diagnosis present

## 2013-04-30 DIAGNOSIS — I5032 Chronic diastolic (congestive) heart failure: Secondary | ICD-10-CM

## 2013-04-30 DIAGNOSIS — I252 Old myocardial infarction: Secondary | ICD-10-CM

## 2013-04-30 DIAGNOSIS — Z933 Colostomy status: Secondary | ICD-10-CM

## 2013-04-30 HISTORY — DX: Nonrheumatic mitral (valve) insufficiency: I34.0

## 2013-04-30 LAB — URINALYSIS, ROUTINE W REFLEX MICROSCOPIC
Hgb urine dipstick: NEGATIVE
Nitrite: NEGATIVE
Specific Gravity, Urine: 1.011 (ref 1.005–1.030)
Urobilinogen, UA: 0.2 mg/dL (ref 0.0–1.0)

## 2013-04-30 LAB — COMPREHENSIVE METABOLIC PANEL
ALT: 24 U/L (ref 0–53)
AST: 28 U/L (ref 0–37)
CO2: 23 mEq/L (ref 19–32)
Calcium: 9.6 mg/dL (ref 8.4–10.5)
Chloride: 102 mEq/L (ref 96–112)
GFR calc non Af Amer: 21 mL/min — ABNORMAL LOW (ref >90)
Sodium: 135 mEq/L (ref 135–145)
Total Bilirubin: 0.6 mg/dL (ref 0.3–1.2)

## 2013-04-30 LAB — PROTIME-INR: INR: 1.82 — ABNORMAL HIGH (ref 0.00–1.49)

## 2013-04-30 LAB — CBC WITH DIFFERENTIAL/PLATELET
Basophils Absolute: 0 10*3/uL (ref 0.0–0.1)
Lymphocytes Relative: 5 % — ABNORMAL LOW (ref 12–46)
Neutro Abs: 10.2 10*3/uL — ABNORMAL HIGH (ref 1.7–7.7)
Platelets: 187 10*3/uL (ref 150–400)
RDW: 19.2 % — ABNORMAL HIGH (ref 11.5–15.5)
WBC: 12 10*3/uL — ABNORMAL HIGH (ref 4.0–10.5)

## 2013-04-30 LAB — URINE MICROSCOPIC-ADD ON

## 2013-04-30 LAB — OCCULT BLOOD X 1 CARD TO LAB, STOOL: Fecal Occult Bld: NEGATIVE

## 2013-04-30 MED ORDER — HYDROCODONE-ACETAMINOPHEN 5-325 MG PO TABS: 1.0000 | ORAL_TABLET | ORAL | Status: DC | PRN

## 2013-04-30 MED ORDER — HYDROMORPHONE HCL PF 1 MG/ML IJ SOLN: 1.0000 mg | INTRAMUSCULAR | Status: DC | PRN

## 2013-04-30 MED ORDER — ATROPINE SULFATE 1 MG/ML IJ SOLN
INTRAMUSCULAR | Status: AC
  Filled 2013-04-30: qty 1

## 2013-04-30 MED ORDER — FERUMOXYTOL INJECTION 510 MG/17 ML: 510.0000 mg | INTRAVENOUS | Status: AC

## 2013-04-30 MED ORDER — ONDANSETRON HCL 4 MG PO TABS: 4.0000 mg | ORAL_TABLET | Freq: Four times a day (QID) | ORAL | Status: DC | PRN

## 2013-04-30 MED ORDER — FOLIC ACID 0.5 MG HALF TAB
0.5000 mg | ORAL_TABLET | Freq: Every day | ORAL | Status: DC
  Administered 2013-05-01: 0.5 mg via ORAL
  Filled 2013-04-30: qty 1

## 2013-04-30 MED ORDER — FERUMOXYTOL INJECTION 510 MG/17 ML
INTRAVENOUS | Status: AC
  Administered 2013-04-30: 510 mg via INTRAVENOUS
  Filled 2013-04-30: qty 17

## 2013-04-30 MED ORDER — SODIUM CHLORIDE 0.9 % IV SOLN
INTRAVENOUS | Status: AC
  Administered 2013-04-30: 250 mL via INTRAVENOUS

## 2013-04-30 MED ORDER — VITAMIN D3 25 MCG (1000 UNIT) PO TABS
1000.0000 [IU] | ORAL_TABLET | Freq: Every day | ORAL | Status: DC
  Administered 2013-05-01: 1000 [IU] via ORAL
  Filled 2013-04-30: qty 1

## 2013-04-30 MED ORDER — SODIUM CHLORIDE 0.9 % IJ SOLN
3.0000 mL | Freq: Two times a day (BID) | INTRAMUSCULAR | Status: DC
  Administered 2013-04-30: 3 mL via INTRAVENOUS

## 2013-04-30 MED ORDER — ADULT MULTIVITAMIN W/MINERALS CH
1.0000 | ORAL_TABLET | Freq: Every day | ORAL | Status: DC
  Administered 2013-05-01: 1 via ORAL
  Filled 2013-04-30: qty 1

## 2013-04-30 MED ORDER — TAMSULOSIN HCL 0.4 MG PO CAPS
0.4000 mg | ORAL_CAPSULE | Freq: Every evening | ORAL | Status: DC
  Administered 2013-04-30: 0.4 mg via ORAL
  Filled 2013-04-30 (×2): qty 1

## 2013-04-30 MED ORDER — ALLOPURINOL 100 MG PO TABS
100.0000 mg | ORAL_TABLET | Freq: Every day | ORAL | Status: DC
  Administered 2013-04-30 – 2013-05-01 (×2): 100 mg via ORAL
  Filled 2013-04-30 (×2): qty 1

## 2013-04-30 MED ORDER — DEXTROSE 5 % IV SOLN
1.0000 g | INTRAVENOUS | Status: DC
  Administered 2013-04-30: 1 g via INTRAVENOUS
  Filled 2013-04-30 (×2): qty 10

## 2013-04-30 MED ORDER — OMEGA-3 FATTY ACIDS 1000 MG PO CAPS: 1.0000 g | ORAL_CAPSULE | Freq: Two times a day (BID) | ORAL | Status: DC

## 2013-04-30 MED ORDER — OMEGA-3-ACID ETHYL ESTERS 1 G PO CAPS
1.0000 g | ORAL_CAPSULE | Freq: Every day | ORAL | Status: DC
  Administered 2013-05-01: 1 g via ORAL
  Filled 2013-04-30: qty 1

## 2013-04-30 MED ORDER — ACETAMINOPHEN 650 MG RE SUPP: 650.0000 mg | Freq: Four times a day (QID) | RECTAL | Status: DC | PRN

## 2013-04-30 MED ORDER — ACETAMINOPHEN 325 MG PO TABS: 650.0000 mg | ORAL_TABLET | Freq: Four times a day (QID) | ORAL | Status: DC | PRN

## 2013-04-30 MED ORDER — NITROGLYCERIN 0.4 MG SL SUBL: 0.4000 mg | SUBLINGUAL_TABLET | SUBLINGUAL | Status: DC | PRN

## 2013-04-30 MED ORDER — FOLIC ACID 400 MCG PO TABS: 400.0000 ug | ORAL_TABLET | Freq: Every day | ORAL | Status: DC

## 2013-04-30 MED ORDER — ONDANSETRON HCL 4 MG/2ML IJ SOLN: 4.0000 mg | Freq: Four times a day (QID) | INTRAMUSCULAR | Status: DC | PRN

## 2013-04-30 NOTE — ED Notes (Signed)
Pt states at start of infusion, back pain lasting a few minutes. Per wife pt became unresponsive for 7 seconds. Pt states he could hear the nurses but did not respond. Pt is alert and oriented, no signs of distress, denies pain or sob.

## 2013-04-30 NOTE — Consult Note (Signed)
Urology Consult  Requesting MD--Dr. Isidoro Donning CC:   HPI: 79 year old male with history of urolithiasis followed by Dr. [name] is admitted to the hospital following an unresponsive episode earlier today while receiving an iron infusion. He apparently experienced severe LBP following that. He was taken to the ER for further evaluation following this episode, where CT was performed revealing 2 large stones in his mid/distal right ureter as well as moderate/severe right hydro. The patient last saw Dr. [name] in Sept. 2-13 where he was found to have an asymptomatic right UPJ stone. Because of his medical history it was elected to follow this conservatively. He had been scheduled to see Dr. [name] recently but appt was cancelled as he was seeing Dr. Kathrene Bongo instead.  He denies any recent right flank pain or hematuria.    PMH: Past Medical History  Diagnosis Date  . Coronary artery disease   . MI, old     AGE 71  . Ulcerative colitis   . Hyperlipidemia   . Hypertension   . Acute renal failure   . History of nephrolithiasis   . PAF (paroxysmal atrial fibrillation)   . LV dysfunction   . Myocardial infarction   . Anemia   . Blood transfusion   . Arthritis   . CHF (congestive heart failure)   . Mitral regurgitation     PSH: Past Surgical History  Procedure Laterality Date  . Cardiac catheterization  10/14/2010    EF 30%  . Coronary artery bypass graft  10/24/2010    LIMA TO THE LAD, A SAPHENOUS VEIN GRAFT TO THE DIAGONAL 1 AND 3, AND SAPHENOUS VEIN GRAFT TO THE DISTAL RIGHT CORONARY ARTERY  . Ileostomy    . Colectomy    . Cataract extraction    . Transthoracic echocardiogram  01/16/2011    EF 50%  . Cardiovascular stress test  10/07/2010    EF 33%  . Appendectomy    . Cystostomy  04/22/2012    Procedure: CYSTOSTOMY SUPRAPUBIC;  Surgeon: Garnett Farm, MD;  Location: [facility] ORS;  Service: Urology;  Laterality: N/A;  CYSTOTOMY WITH MIDLINE INCISION REMOVAL OF STONE  . Tee without  cardioversion  08/28/2012    Procedure: TRANSESOPHAGEAL ECHOCARDIOGRAM (TEE);  Surgeon: [name], MD;  Location: [facility] ENDOSCOPY;  Service: Cardiovascular;  Laterality: N/A;  . Cardioversion  08/28/2012    Procedure: CARDIOVERSION;  Surgeon: [name], MD;  Location: [facility] ENDOSCOPY;  Service: Cardiovascular;  Laterality: N/A;    Allergies: No Known Allergies  Medications: Prescriptions prior to admission  Medication Sig Dispense Refill  . acetaminophen (TYLENOL) 650 MG CR tablet Take 650 mg by mouth 2 (two) times daily as needed for pain.       [name] allopurinol (ZYLOPRIM) 100 MG tablet Take 1 tablet (100 mg total) by mouth daily.  90 tablet  1  . amiodarone (PACERONE) 200 MG tablet Take 100 mg by mouth every evening.      . carvedilol (COREG) 6.25 MG tablet Take 6.25 mg by mouth 2 (two) times daily with a meal.      . cholecalciferol (VITAMIN D) 1000 UNITS tablet Take 1,000 Units by mouth daily.      . ferrous sulfate 325 (65 FE) MG tablet Take 325 mg by mouth every evening.      . fish oil-omega-3 fatty acids 1000 MG capsule Take 1 g by mouth 2 (two) times daily.       [name] FLAXSEED, LINSEED, PO Take 1 tablet by  mouth at bedtime.       . folic acid (FOLVITE) 400 MCG tablet Take 400 mcg by mouth daily.       . furosemide (LASIX) 40 MG tablet Take 20-40 mg by mouth 3 (three) times daily. 1 tab  In the am, 1/2 tab at lunch, and 1 tab at bedtime      . hydrALAZINE (APRESOLINE) 25 MG tablet Take 12.5 mg by mouth 3 (three) times daily.      . isosorbide mononitrate (IMDUR) 60 MG 24 hr tablet Take 1 tablet (60 mg total) by mouth daily.  90 tablet  3  . Multiple Vitamin (MULITIVITAMIN WITH MINERALS) TABS Take 1 tablet by mouth daily.      . Multiple Vitamins-Minerals (ICAPS AREDS FORMULA PO) Take 1 capsule by mouth every evening.      . nitroGLYCERIN (NITROSTAT) 0.4 MG SL tablet Place 0.4 mg under the tongue every 5 (five) minutes as needed for chest pain.       . potassium chloride SA  (K-DUR,KLOR-CON) 20 MEQ tablet Take 1 tablet (20 mEq total) by mouth daily.  90 tablet  3  . Tamsulosin HCl (FLOMAX) 0.4 MG CAPS Take 0.4 mg by mouth every evening.      . warfarin (COUMADIN) 5 MG tablet Take 2.5-5 mg by mouth at bedtime. Weds only-1 tab (5 mg); All other days 0.5 half tab (2.5 mg)         Social History: History   Social History  . Marital Status: Married    Spouse Name: N/A    Number of Children: N/A  . Years of Education: N/A   Occupational History  . Not on file.   Social History Main Topics  . Smoking status: Former Smoker -- 1.00 packs/day for 15 years    Types: Cigarettes, Cigars    Quit date: 12/25/1968  . Smokeless tobacco: Not on file  . Alcohol Use: No     Comment: occasional glass of wine  . Drug Use: No  . Sexually Active: Not Currently   Other Topics Concern  . Not on file   Social History Narrative  . No narrative on file    Family History: Family History  Problem Relation Age of Onset  . Colon cancer Mother     colon  . Heart attack Father   . Hypertension Father   . Heart disease Sister   . Hypertension Sister   . Heart disease Brother   . Hypertension Brother   . Heart disease Sister   . Hypertension Sister   . Heart disease Sister   . Hypertension Sister   . Heart disease Brother   . Hypertension Brother   . Heart disease Brother   . Hypertension Brother   . Heart disease Brother   . Hypertension Brother   . Heart disease Brother   . Hypertension Brother   . Heart disease Brother   . Hypertension Brother   . Heart disease Brother   . Hypertension Brother   . Heart disease Brother   . Hypertension Brother   . Stroke Brother     Review of Systems: Positive: Gout pain in right hand, recent LBP Negative:  A further 10 point review of systems was negative except what is listed in the HPI.  Physical Exam: @VITALS2 @ General: No acute distress.  Awake. Head:  Normocephalic.  Atraumatic. ENT:  EOMI.  Mucous  membranes moist Neck:  Supple.  No lymphadenopathy. CV:  S1 present. S2 present. Regular  rate. Pulmonary: Equal effort bilaterally.  Clear to auscultation bilaterally. Abdomen: Soft.  Non tender to palpation. No CVAT Skin:  Normal turgor.  No visible rash. Extremity: No gross deformity of bilateral upper extremities.  No gross deformity of bilateral lower extremities. Neurologic: Alert. Appropriate mood.  I reviewed the patient's CT scan, performed earlier today. He has 2 sizable stones in the mid and distal ureter on the right with resultant right hydronephrosis, severe in nature. There are left sided renal calculi, as well as some small right renal calculi. There are small, probable cystic structures in each kidney. There is a small bladder stone. There is no left-sided hydronephrosis.  Studies:  Recent Labs     04/30/13  1235  HGB  7.9*  WBC  12.0*  PLT  187    Recent Labs     04/30/13  1235  NA  135  K  3.7  CL  102  CO2  23  BUN  35*  CREATININE  2.67*  CALCIUM  9.6  GFRNONAA  21*  GFRAA  24*     Recent Labs     04/30/13  1235  INR  1.82*     No components found with this basename: ABG,     Assessment:  2 fairly large, irregularly-shaped right mid and distal ureteral calculi with resultant right hydronephrosis. Creatinine is 2.67. The patient is relatively asymptomatic from these ureteral calculi at the present time, and there is no significant urinary tract infection so I think treatment of the stones would involve a non-emergent stent placement, followed by eventual ureteroscopy versus ESWL.  The patient is followed by Dr. [name]. We will follow the patient during his hospitalization and arrange appropriate management of his obstruction and ureteral calculi.  CC: Dr. Isidoro Donning    [phone]

## 2013-04-30 NOTE — ED Notes (Signed)
Patient transported to CT 

## 2013-04-30 NOTE — ED Provider Notes (Signed)
History     CSN: [id number]  Arrival date & time 04/30/13  1117   First MD Initiated Contact with Patient 04/30/13 1126      Chief Complaint  Patient presents with  . Loss of Consciousness    (Consider location/radiation/quality/duration/timing/severity/associated sxs/prior treatment) Patient is a 79 y.o. male presenting with syncope. The history is provided by the patient and medical records.  Loss of Consciousness   He was in short stay getting intravenous iron when he was reported to become unresponsive with no palpable pulse or blood pressure. This lasted about 30 seconds before he regained consciousness and blood pressure was normal following this. At this point, he was complaining of severe pain in his lower back which she rated at 10/10. The pain has since resolved. He denies nausea, vomiting, chest pain, diaphoresis but he does not remember passing out. Nursing note 2 days states that he did have periods of bradycardia following that with heart rate getting as low as the 20s. He is followed by a nephrologist but is not on dialysis. He feels like he is back to his baseline.  Past Medical History  Diagnosis Date  . Coronary artery disease   . MI, old     AGE 71  . Ulcerative colitis   . Hyperlipidemia   . Hypertension   . Acute renal failure   . History of nephrolithiasis   . PAF (paroxysmal atrial fibrillation)   . LV dysfunction   . Myocardial infarction   . Anemia   . Blood transfusion   . Arthritis   . CHF (congestive heart failure)   . Mitral regurgitation     Past Surgical History  Procedure Laterality Date  . Cardiac catheterization  10/14/2010    EF 30%  . Coronary artery bypass graft  10/24/2010    LIMA TO THE LAD, A SAPHENOUS VEIN GRAFT TO THE DIAGONAL 1 AND 3, AND SAPHENOUS VEIN GRAFT TO THE DISTAL RIGHT CORONARY ARTERY  . Ileostomy    . Colectomy    . Cataract extraction    . Transthoracic echocardiogram  01/16/2011    EF 50%  . Cardiovascular stress  test  10/07/2010    EF 33%  . Appendectomy    . Cystostomy  04/22/2012    Procedure: CYSTOSTOMY SUPRAPUBIC;  Surgeon: Garnett Farm, MD;  Location: [facility];  Service: Urology;  Laterality: N/A;  CYSTOTOMY WITH MIDLINE INCISION REMOVAL OF STONE  . Tee without cardioversion  08/28/2012    Procedure: TRANSESOPHAGEAL ECHOCARDIOGRAM (TEE);  Surgeon: [name], MD;  Location: [facility] ENDOSCOPY;  Service: Cardiovascular;  Laterality: N/A;  . Cardioversion  08/28/2012    Procedure: CARDIOVERSION;  Surgeon: [name], MD;  Location: [facility] ENDOSCOPY;  Service: Cardiovascular;  Laterality: N/A;    Family History  Problem Relation Age of Onset  . Colon cancer Mother     colon  . Heart attack Father   . Hypertension Father   . Heart disease Sister   . Hypertension Sister   . Heart disease Brother   . Hypertension Brother   . Heart disease Sister   . Hypertension Sister   . Heart disease Sister   . Hypertension Sister   . Heart disease Brother   . Hypertension Brother   . Heart disease Brother   . Hypertension Brother   . Heart disease Brother   . Hypertension Brother   . Heart disease Brother   . Hypertension Brother   . Heart disease Brother   .  Hypertension Brother   . Heart disease Brother   . Hypertension Brother   . Heart disease Brother   . Hypertension Brother   . Stroke Brother     History  Substance Use Topics  . Smoking status: Former Smoker -- 1.00 packs/day for 15 years    Types: Cigarettes, Cigars    Quit date: 12/25/1968  . Smokeless tobacco: Not on file  . Alcohol Use: No     Comment: occasional glass of wine      Review of Systems  Cardiovascular: Positive for syncope.  All other systems reviewed and are negative.    Allergies  Review of patient's allergies indicates no known allergies.  Home Medications   Current Outpatient Rx  Name  Route  Sig  Dispense  Refill  . acetaminophen (TYLENOL) 650 MG CR tablet   Oral   Take 650 mg by mouth 2 (two)  times daily as needed for pain. arthritis         . allopurinol (ZYLOPRIM) 100 MG tablet   Oral   Take 1 tablet (100 mg total) by mouth daily.   90 tablet   1   . amiodarone (PACERONE) 200 MG tablet      Take 1/2 of a 200 mg tablet daily   90 tablet   3   . atorvastatin (LIPITOR) 40 MG tablet   Oral   Take 40 mg by mouth daily.         . carvedilol (COREG) 6.25 MG tablet      TAKE 1 TABLET BY MOUTH TWICE A DAY   60 tablet   3   . Cholecalciferol (VITAMIN D-3 PO)   Oral   Take 1,000 Units by mouth daily.          . colchicine 0.6 MG tablet   Oral   Take 1 tablet (0.6 mg total) by mouth daily.   90 tablet   1   . diphenoxylate-atropine (LOMOTIL) 2.5-0.025 MG per tablet   Oral   Take 1 tablet by mouth 4 (four) times daily.   90 tablet   1   . ferrous sulfate 325 (65 FE) MG tablet   Oral   Take 325 mg by mouth every evening.         . fish oil-omega-3 fatty acids 1000 MG capsule   Oral   Take 2 g by mouth daily.          [name] FLAXSEED, LINSEED, PO   Oral   Take 1 tablet by mouth every evening.         . folic acid (FOLVITE) 400 MCG tablet   Oral   Take 400 mcg by mouth daily.          . furosemide (LASIX) 40 MG tablet   Oral   Take 20 mg by mouth daily.          . hydrALAZINE (APRESOLINE) 25 MG tablet      Take one and 1/2 tablets three times per day.   405 tablet   3   . isosorbide mononitrate (IMDUR) 60 MG 24 hr tablet   Oral   Take 1 tablet (60 mg total) by mouth daily.   90 tablet   3   . Multiple Vitamin (MULITIVITAMIN WITH MINERALS) TABS   Oral   Take 1 tablet by mouth daily.         . Multiple Vitamins-Minerals (ICAPS AREDS FORMULA PO)   Oral   Take 1 capsule by  mouth every evening.         . nitroGLYCERIN (NITROSTAT) 0.4 MG SL tablet   Sublingual   Place 0.4 mg under the tongue every 5 (five) minutes as needed. Chest pain         . potassium chloride SA (K-DUR,KLOR-CON) 20 MEQ tablet   Oral   Take 1 tablet  (20 mEq total) by mouth daily.   90 tablet   3   . Tamsulosin HCl (FLOMAX) 0.4 MG CAPS   Oral   Take 0.4 mg by mouth every evening.         . warfarin (COUMADIN) 5 MG tablet   Oral   Take 0.5-1 tablets (2.5-5 mg total) by mouth daily. Takes half tablet on Tuesday, Thursday and Saturday Takes whole tablet on Monday, Wednesday, Friday and Sunday   90 tablet   3     BP 120/58  Pulse 62  Resp 16  SpO2 99%  Physical Exam  Nursing note and vitals reviewed.  79 year old male, resting comfortably and in no acute distress. Vital signs are normal. Oxygen saturation is 99%, which is normal. Head is normocephalic and atraumatic. PERRLA, EOMI. Oropharynx is clear. Neck is nontender and supple without adenopathy or JVD. Back is nontender and there is no CVA tenderness. Lungs are clear without rales, wheezes, or rhonchi. Chest is nontender. Heart has regular rate and rhythm without murmur. Abdomen is soft, flat, nontender without masses or hepatosplenomegaly and peristalsis is normoactive. Colostomy is present in the right lower abdomen. Extremities have no cyanosis or edema, full range of motion is present. Soft tissue mass is present over the right second finger PIP joint which is soft suggesting a lipoma. Skin is warm and dry without rash. Neurologic: Mental status is normal, cranial nerves are intact, there are no motor or sensory deficits.  ED Course  Procedures (including critical care time)  Results for orders placed during the hospital encounter of 04/30/13  CBC WITH DIFFERENTIAL      Result Value Range   WBC 12.0 (*) 4.0 - 10.5 K/uL   RBC 3.09 (*) 4.22 - 5.81 MIL/uL   Hemoglobin 7.9 (*) 13.0 - 17.0 g/dL   HCT 40.9 (*) 81.1 - 91.4 %   MCV 77.7 (*) 78.0 - 100.0 fL   MCH 25.6 (*) 26.0 - 34.0 pg   MCHC 32.9  30.0 - 36.0 g/dL   RDW 78.2 (*) 95.6 - 21.3 %   Platelets 187  150 - 400 K/uL   Neutrophils Relative 85 (*) 43 - 77 %   Neutro Abs 10.2 (*) 1.7 - 7.7 K/uL    Lymphocytes Relative 5 (*) 12 - 46 %   Lymphs Abs 0.6 (*) 0.7 - 4.0 K/uL   Monocytes Relative 9  3 - 12 %   Monocytes Absolute 1.1 (*) 0.1 - 1.0 K/uL   Eosinophils Relative 0  0 - 5 %   Eosinophils Absolute 0.1  0.0 - 0.7 K/uL   Basophils Relative 0  0 - 1 %   Basophils Absolute 0.0  0.0 - 0.1 K/uL  COMPREHENSIVE METABOLIC PANEL      Result Value Range   Sodium 135  135 - 145 mEq/L   Potassium 3.7  3.5 - 5.1 mEq/L   Chloride 102  96 - 112 mEq/L   CO2 23  19 - 32 mEq/L   Glucose, Bld 94  70 - 99 mg/dL   BUN 35 (*) 6 - 23 mg/dL   Creatinine,  Ser 2.67 (*) 0.50 - 1.35 mg/dL   Calcium 9.6  8.4 - 95.6 mg/dL   Total Protein 6.6  6.0 - 8.3 g/dL   Albumin 2.3 (*) 3.5 - 5.2 g/dL   AST 28  0 - 37 U/L   ALT 24  0 - 53 U/L   Alkaline Phosphatase 73  39 - 117 U/L   Total Bilirubin 0.6  0.3 - 1.2 mg/dL   GFR calc non Af Amer 21 (*) >90 mL/min   GFR calc Af Amer 24 (*) >90 mL/min  TROPONIN I      Result Value Range   Troponin I <0.30  <0.30 ng/mL  PROTIME-INR      Result Value Range   Prothrombin Time 20.4 (*) 11.6 - 15.2 seconds   INR 1.82 (*) 0.00 - 1.49   Ct Abdomen Pelvis Wo Contrast  04/30/2013  *RADIOLOGY REPORT*  Clinical Data: Bilateral flank pain.  History of bowel resection.  CT ABDOMEN AND PELVIS WITHOUT CONTRAST  Technique:  Multidetector CT imaging of the abdomen and pelvis was performed following the standard protocol without intravenous contrast.  Comparison: 08/06/2010  Findings: 60 mm well-defined low density lesion in the dome of the left liver is stable, compatible with a cyst.  Tiny adjacent 5 mm stable cyst is evident.  The spleen has normal uninfused features. The stomach, duodenum, pancreas, gallbladder, and right adrenal gland are unremarkable.  16 mm left adrenal nodule is unchanged, consistent with benign process, probably adrenal adenoma.  2.5 cm exophytic lesion from the upper pole of the right kidney has attenuation slightly higher than would be expected for a simple  cyst.  A 13 mm high density lesion is also seen in the upper pole of the right kidney.  Other exophytic lesions of varying attenuation and size are distributed throughout the right kidney. 5 mm stone is identified in the interpolar region.  The right ureter is dilated to its mid point where a 13 x 10 x 11 mm stone is identified.  A second 12 x 10 x 11 mm stone is identified in the distal right ureter, at the level of the iliac vessels.  6 mm stone is identified in the posterior bladder.  The left kidney has multiple lesions of varying size and attenuation, some of which are too dense to represent simple cyst.  No abdominal aortic aneurysm although there is dense atherosclerotic calcification in the abdominal aorta which measures up to 2.6 cm in diameter.  No free fluid or lymphadenopathy in the abdomen.  Imaging through the pelvis shows a right lower quadrant ileostomy with parastomal herniation of small bowel.  No evidence for incarceration or obstruction.  No pelvic sidewall lymphadenopathy.  Prostate gland is enlarged. There is slight asymmetric wall thickening in the left bladder. The patient is status post colectomy with presumed scarring in the presacral space.  Bone windows reveal no worrisome lytic or sclerotic osseous lesions.  IMPRESSION: Moderate to advanced right-sided hydronephrosis secondary to a pair of ureteral stone is identified at the level of the mid and mid - distal ureter.  There is also a stone identified in the bladder lumen.  Numerous bilateral renal lesions of varying attenuation in size. Some of these image compatibly with cysts but others have attenuation too high to represent simple cysts and soft tissue lesions could have the same imaging features.  MRI of the abdomen without and with contrast would be the study of choice to further evaluate.  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  No complicating features at this time.  Mild asymmetric and relatively  diffuse thickening of the left bladder wall, a finding of indeterminate etiology. This is likely chronic and is similar appearance was noted previously when the patient was noted to have a large bladder stone measuring up to 4 cm.   Original Report Authenticated By: Kennith Center, M.D.    Ct Head Wo Contrast  04/30/2013  *RADIOLOGY REPORT*  Clinical Data: Syncopal episode.  CT HEAD WITHOUT CONTRAST  Technique:  Contiguous axial images were obtained from the base of the skull through the vertex without contrast.  Comparison: None.  Findings: No intracranial hemorrhage.  No CT evidence of large acute infarct.  Global atrophy.  No intracranial mass lesion detected on this unenhanced exam.  Small vessel disease type changes.  IMPRESSION: No intracranial hemorrhage or CT evidence of large acute infarct.   Original Report Authenticated By: [name], M.D.       Date: 04/30/2013  Rate: 63  Rhythm: normal sinus rhythm  QRS Axis: normal  Intervals: normal  ST/T Wave abnormalities: normal  Conduction Disutrbances:right bundle branch block  Narrative Interpretation:  Right bundle-branch block. Compared with ECG of 03/06/1999 410, no significant changes are seen.  Old EKG Reviewed: unchanged    1. Syncope   2. Anemia   3. Bradycardia   4. Renal insufficiency       MDM  Syncopal episode with documented bradycardia. He did have severe back pain but that came after his episode of syncope. He has no other symptoms suggestive of vasovagal episode. He will need to be admitted for cardiac monitoring.  Workup is significant for anemia with hemoglobin 7.9. Last hemoglobin was 7 months ago and was 11. RBC indices are normal. Case is discussed with Dr.Rai of [facility] who agrees to admit the patient and requests he be admitted to step down unit. Cardiology consultation will be obtained. If he has additional episodes of bradycardia, he may need to be considered for a pacemaker.    Dione Booze,  MD 04/30/13 [phone]

## 2013-04-30 NOTE — ED Notes (Addendum)
MD at bedside. Cardiology 

## 2013-04-30 NOTE — ED Notes (Signed)
Pt was at short  Stay today  getting iron txs x 2 , no problem w/ first and when they pushed 2nd had a period of unresp w/ no bp and when he came around bp was 110 sys and heart in upper 20s then came up to 92s dr Annett Gula notified and pt brought to er

## 2013-04-30 NOTE — Progress Notes (Signed)
1020 patient became unresponsive and eyes deviated right lasting less than 60 seconds.  Placed on o2 and opened eyes and complained of low back throbbing pain.  Rapid response paged.   NSS running wide open.  Patient alert and oriented.

## 2013-04-30 NOTE — Consult Note (Signed)
CARDIOLOGY CONSULT NOTE  Patient ID: [name] [name] MRN: [id number], DOB/AGE: 77-04-1930   Admit date: 04/30/2013 Date of Consult: 04/30/2013  Primary Physician: [name], MD  Primary Cardiologist: [name], MD Reason for Consultation: Syncope, bradycardia  History of Present Illness [name] [name] is an 79 year old man with CAD, ischemic CM, chronic systolic HF, severe MR, anemia, CKD and PAF who was receiving an iron infusion today in short stay when he became unresponsive. I have spoken with the nurses caring for him in short stay at the time. His LOC episode occurred immediately after he had received Feraheme IV push. Apparently this is a known possible reaction to Feraheme. He experienced jerky movements followed by LOC then severe back pain upon regaining consciousness which are considered adverse reactions from administration of Feraheme. According to the nurse in short stay, he was unresponsive for about 30 seconds. Initially they were unable to measure his BP. Once he recovered his SBP was 100. Also while in short stay, his rate was found to be slow, 38 bpm. He was transported to the ED for further evaluation.   [name] [name] doesn't recall much of the episode except his severe back pain. He denies any warning or prodrome. He denies CP or SOB. He denies palpitations or dizziness. After regaining consciousness, he experienced severe back pain which has since resolved. He has never experienced syncope prior to this episode.   Of note, [name] [name] was last seen in our office on 03/05/2013. He was in sinus bradycardia in the 40s; therefore, Dr. [name] decreased his amiodarone dose to 100 mg once daily. His HF medications were adjusted as well, Imdur and hydralazine doses were increased. He was continued on Coreg and Lasix. His severe MR is being managed medically as he would be high risk for MVR given prior sternotomy and significant CKD.  Past Medical History Past Medical History  Diagnosis  Date  . Coronary artery disease   . MI, old     AGE 79  . Ulcerative colitis   . Hyperlipidemia   . Hypertension   . Acute renal failure   . History of nephrolithiasis   . PAF (paroxysmal atrial fibrillation)   . LV dysfunction   . Myocardial infarction   . Anemia   . Blood transfusion   . Arthritis   . CHF (congestive heart failure)   . Mitral regurgitation     Past Surgical History Past Surgical History  Procedure Laterality Date  . Cardiac catheterization  10/14/2010    EF 30%  . Coronary artery bypass graft  10/24/2010    LIMA TO THE LAD, A SAPHENOUS VEIN GRAFT TO THE DIAGONAL 1 AND 3, AND SAPHENOUS VEIN GRAFT TO THE DISTAL RIGHT CORONARY ARTERY  . Ileostomy    . Colectomy    . Cataract extraction    . Transthoracic echocardiogram  01/16/2011    EF 50%  . Cardiovascular stress test  10/07/2010    EF 33%  . Appendectomy    . Cystostomy  04/22/2012    Procedure: CYSTOSTOMY SUPRAPUBIC;  Surgeon: Garnett Farm, MD;  Location: [facility]S;  Service: Urology;  Laterality: N/A;  CYSTOTOMY WITH MIDLINE INCISION REMOVAL OF STONE  . Tee without cardioversion  08/28/2012    Procedure: TRANSESOPHAGEAL ECHOCARDIOGRAM (TEE);  Surgeon: [name], MD;  Location: [facility] ENDOSCOPY;  Service: Cardiovascular;  Laterality: N/A;  . Cardioversion  08/28/2012    Procedure: CARDIOVERSION;  Surgeon: [name], MD;  Location: [facility] ENDOSCOPY;  Service:  Cardiovascular;  Laterality: N/A;    Allergies/Intolerances No Known Allergies  Current Home Medications      acetaminophen 650 MG CR tablet  Commonly known as:  TYLENOL  Take 650 mg by mouth 2 (two) times daily as needed for pain.     allopurinol 100 MG tablet  Commonly known as:  ZYLOPRIM  Take 1 tablet (100 mg total) by mouth daily.     amiodarone 200 MG tablet  Commonly known as:  PACERONE  Take 100 mg by mouth every evening.     carvedilol 6.25 MG tablet  Commonly known as:  COREG  Take 6.25 mg by mouth 2 (two) times daily with a  meal.     cholecalciferol 1000 UNITS tablet  Commonly known as:  VITAMIN D  Take 1,000 Units by mouth daily.     ferrous sulfate 325 (65 FE) MG tablet  Take 325 mg by mouth every evening.     fish oil-omega-3 fatty acids 1000 MG capsule  Take 1 g by mouth 2 (two) times daily.     FLAXSEED (LINSEED) PO  Take 1 tablet by mouth at bedtime.     folic acid 400 MCG tablet  Commonly known as:  FOLVITE  Take 400 mcg by mouth daily.     furosemide 40 MG tablet  Commonly known as:  LASIX  Take 20-40 mg by mouth 3 (three) times daily. 1 tab  In the am, 1/2 tab at lunch, and 1 tab at bedtime     hydrALAZINE 25 MG tablet  Commonly known as:  APRESOLINE  Take 12.5 mg by mouth 3 (three) times daily.     ICAPS AREDS FORMULA PO  Take 1 capsule by mouth every evening.     isosorbide mononitrate 60 MG 24 hr tablet  Commonly known as:  IMDUR  Take 1 tablet (60 mg total) by mouth daily.     multivitamin with minerals Tabs  Take 1 tablet by mouth daily.     nitroGLYCERIN 0.4 MG SL tablet  Commonly known as:  NITROSTAT  Place 0.4 mg under the tongue every 5 (five) minutes as needed for chest pain.     potassium chloride SA 20 MEQ tablet  Commonly known as:  K-DUR,KLOR-CON  Take 1 tablet (20 mEq total) by mouth daily.     tamsulosin 0.4 MG Caps  Commonly known as:  FLOMAX  Take 0.4 mg by mouth every evening.     warfarin 5 MG tablet  Commonly known as:  COUMADIN  Take 2.5-5 mg by mouth at bedtime. Weds only-1 tab (5 mg); All other days 0.5 half tab (2.5 mg)     Family History Positive for premature CAD and HTN   Social History Social History  . Marital Status: Married   Social History Main Topics  . Smoking status: Former Smoker -- 1.00 packs/day for 15 years    Types: Cigarettes, Cigars    Quit date: 12/25/1968  . Smokeless tobacco: No  . Alcohol Use: No     Comment: occasional glass of wine  . Drug Use: No   Review of Systems General: No chills, fever, night sweats or  weight changes  Cardiovascular:  No chest pain, dyspnea on exertion, edema, orthopnea, palpitations, paroxysmal nocturnal dyspnea Dermatological: No rash, lesions or masses Respiratory: No cough, dyspnea Urologic: No hematuria, dysuria Abdominal: No nausea, vomiting, diarrhea, bright red blood per rectum, melena, or hematemesis Neurologic: No visual changes, weakness All other systems reviewed and are otherwise negative  except as noted above.  Physical Exam Blood pressure 116/59, pulse 66, resp. rate 16, SpO2 100.00%.  General: Well developed, pale 79 year old male in no acute distress. HEENT: Normocephalic, atraumatic. EOMs intact. Sclera nonicteric. Oropharynx clear.  Neck: Supple. No JVD. Lungs: Respirations regular and unlabored, CTA bilaterally. No wheezes, rales or rhonchi. Heart: RRR. S1, S2 present. Faint systolic murmur at apex. No rub, S3 or S4. Abdomen: Soft, non-tender, non-distended. BS present x 4 quadrants. No hepatosplenomegaly.  Extremities: No clubbing, cyanosis or edema. DP/PT/Radials 2+ and equal bilaterally. Psych: Normal affect. Neuro: Alert and oriented X 3. Moves all extremities spontaneously. Musculoskeletal: No kyphosis. Skin: Intact. Warm and dry. No rashes or petechiae in exposed areas. Multiple ecchymoses on forearms bilaterally.   Labs  Recent Labs  04/30/13 1235  TROPONINI <0.30   Lab Results  Component Value Date   WBC 12.0* 04/30/2013   HGB 7.9* 04/30/2013   HCT 24.0* 04/30/2013   MCV 77.7* 04/30/2013   PLT 187 04/30/2013    Recent Labs Lab 04/30/13 1235  NA 135  K 3.7  CL 102  CO2 23  BUN 35*  CREATININE 2.67*  CALCIUM 9.6  PROT 6.6  BILITOT 0.6  ALKPHOS 73  ALT 24  AST 28  GLUCOSE 94   No results found for this basename: TSH, T4TOTAL, FREET3, T3FREE, THYROIDAB,  in the last 72 hours   Recent Labs  04/30/13 1235  INR 1.82*    Radiology/Studies  Ct Abdomen Pelvis Wo Contrast   04/30/2013 IMPRESSION: Moderate to advanced  right-sided hydronephrosis secondary to a pair of ureteral stone is identified at the level of the mid and mid - distal ureter.  There is also a stone identified in the bladder lumen.  Numerous bilateral renal lesions of varying attenuation in size. Some of these image compatibly with cysts but others have attenuation too high to represent simple cysts and soft tissue lesions could have the same imaging features.  MRI of the abdomen without and with contrast would be the study of choice to further evaluate.  Right lower quadrant ileostomy with parastomal herniation of small bowel.  No complicating features at this time.  Mild asymmetric and relatively diffuse thickening of the left bladder wall, a finding of indeterminate etiology. This is likely chronic and is similar appearance was noted previously when the patient was noted to have a large bladder stone measuring up to 4 cm.     Ct Head Wo Contrast   04/30/2013   IMPRESSION: No intracranial hemorrhage or CT evidence of large acute infarct.      Echocardiogram November 2013 Study Conclusions - Left ventricle: The cavity size was moderately dilated. Wall thickness was normal. Systolic function was mildly reduced. The estimated ejection fraction was in the range of 45% to 50%. - Mitral valve: Severe regurgitation. - Left atrium: The atrium was mildly dilated. - Right ventricle: The cavity size was mildly dilated. Systolic function was moderately reduced. - Right atrium: The atrium was mildly dilated.   Rhythm strips from short stay reviewed - sinus bradycardia with competing junctional rhythm, 38 bpm 12-lead ECG done in short stay - SR with RBBB at 63 bpm Telemetry shows SR at 55 bpm   Assessment and Plan 1. Syncope - probable adverse reaction to Feraheme; spoke with [facility] - approximately 3% of patients will have adverse rxn, either anaphylaxis/anaphylactoid rxn or profound hypotension 2. Sinus bradycardia - known; Dr. [name] made  recent medication adjustments as outlined above 3. PAF -  stable; has maintained SR on low dose amiodarone 4. Ischemic CM, mild LV dysfunction, severe MR - stable; continue medical therapy 5. CAD s/p CABG - stable without anginal symptoms; continue medical therapy 6. Anemia - per primary team; with underlying CAD, would recommend goal Hgb >10 7. CKD - per primary team  Dr. Elease Hashimoto to see. Please see further recommendations below. Signed, Rick Duff, PA-C 04/30/2013, 2:59 PM  Attending Note:   The patient was seen and examined.  Agree with assessment and plan as noted above.  Changes made to the above note as needed.  I agree that this episode seems to be directly related to the Feraheme infusion.  I would not change any of his cardiac meds based on this reaction.   He can see Dr. [name] in the office at his regularly scheduled apt.   We will sign off.  Call for questions.   Vesta Mixer, Montez Hageman., MD, [facility] 04/30/2013, 5:09 PM

## 2013-04-30 NOTE — Progress Notes (Signed)
RRT spoke with Dr Kathrene Bongo and was instructed to take pt to the Er for further evaluation.

## 2013-04-30 NOTE — Progress Notes (Signed)
Asked by Dr Isidoro Donning to get baseline info.  Hg on 04/07/13 was 8.1 with iron sat of 7%, hence the iv iron.  He is not on EPO yet.  Last Scr 04/07/13 was 2.36.

## 2013-04-30 NOTE — ED Notes (Signed)
Rapid response had them do ekg in short stay before bringing him dow and pt got 250 bolus also

## 2013-04-30 NOTE — Progress Notes (Signed)
Pt came in for second dose of feraheme IV push.  Pt alert and oriented at that time.  Pushed the second dose of feraheme according to protocal and pt went unresponsive for approximately 30seconds and we were unable to obtain a BP. Placed oxygen on pt hooked pt up to monitor on code cart while the secretary called RRT.  When pt became arousable he complained of low back pain and heart rate was brady in the 50's with occasional pauses into the high 20's to lower 30's.  Obtained a 12 lead EKG.  Notified Arna Medici at [facility] and RRT paged Dr. Kathrene Bongo for further instructions.  Wife at patients bedside

## 2013-04-30 NOTE — H&P (Signed)
History and Physical       Hospital Admission Note Date: 04/30/2013  Patient name: [name] [name] Medical record number: [id number] Date of birth: [birth year] Age: 79 y.o. Gender: male PCP: METHENEY,CATHERINE, MD    Chief Complaint:  Syncopal episode during the IV iron transfusion, severe bradycardia  HPI: Patient is a 79 year old male with multiple medical problems including coronary artery disease, ulcerative colitis with colostomy, hypertension, hyperlipidemia, CAD, paroxysmal atrial fibrillation, chronic anemia, mitral regurgitation was at short stay today getting intravenous iron when he became unresponsive. History was obtained from the patient as well as review of the records of rapid response at short stay. After the second dose of IV iron, at 10:20 AM, patient became unresponsive for approximately 30 seconds and they were unable to obtain a BP. His heart rate was bradycardiac in high 20 to low 30s with pauses. When patient became arousable spontaneously, he complained of severe lower back/abdominal pain after the syncopal episode which has completely resolved now. He was brought to the ED,his heart rate has been in 50-60's and he has no complaints of chest pain, diaphoresis, nausea, vomiting. His hemoglobin today was 7.9. He denies any bleeding in the colostomy bag. Patient reports that he did have hematuria sometime ago and he was prescribed antibiotics and this has completely resolved. He sees urologist Dr. [name].   Review of Systems:  Constitutional: Denies fever, chills, diaphoresis, poor appetite and fatigue.  HEENT: Denies photophobia, eye pain, redness, hearing loss, ear pain, congestion, sore throat, rhinorrhea, sneezing, mouth sores, trouble swallowing, neck pain, neck stiffness and tinnitus.   Respiratory: Denies SOB, DOE, cough, chest tightness,  and wheezing.   Cardiovascular: Denies chest pain, palpitations and leg  swelling.  please see history of present illness Gastrointestinal: Denies nausea, vomiting, diarrhea, constipation, blood in stool and abdominal distention.  Genitourinary: Denies dysuria, urgency, frequency, hematuria, flank pain and difficulty urinating.  Musculoskeletal: Denies myalgias, joint swelling, arthralgias and gait problem.  severe low back/abdominal pain today now resolved Skin: Denies pallor, rash and wound.  Neurological: Denies dizziness, seizures,  and headaches.  syncope today Hematological: Denies adenopathy. Easy bruising, personal or family bleeding history  Psychiatric/Behavioral: Denies suicidal ideation, mood changes, confusion, nervousness, sleep disturbance and agitation  Past Medical History: Past Medical History  Diagnosis Date  . Coronary artery disease   . MI, old     AGE 61  . Ulcerative colitis   . Hyperlipidemia   . Hypertension   . Acute renal failure   . History of nephrolithiasis   . PAF (paroxysmal atrial fibrillation)   . LV dysfunction   . Myocardial infarction   . Anemia   . Blood transfusion   . Arthritis   . CHF (congestive heart failure)   . Mitral regurgitation    Past Surgical History  Procedure Laterality Date  . Cardiac catheterization  10/14/2010    EF 30%  . Coronary artery bypass graft  10/24/2010    LIMA TO THE LAD, A SAPHENOUS VEIN GRAFT TO THE DIAGONAL 1 AND 3, AND SAPHENOUS VEIN GRAFT TO THE DISTAL RIGHT CORONARY ARTERY  . Ileostomy    . Colectomy    . Cataract extraction    . Transthoracic echocardiogram  01/16/2011    EF 50%  . Cardiovascular stress test  10/07/2010    EF 33%  . Appendectomy    . Cystostomy  04/22/2012    Procedure: CYSTOSTOMY SUPRAPUBIC;  Surgeon: Garnett Farm, MD;  Location: [facility];  Service: Urology;  Laterality:  N/A;  CYSTOTOMY WITH MIDLINE INCISION REMOVAL OF STONE  . Tee without cardioversion  08/28/2012    Procedure: TRANSESOPHAGEAL ECHOCARDIOGRAM (TEE);  Surgeon: [name], MD;   Location[facility];  Service: Cardiovascular;  Laterality: N/A;  . Cardioversion  08/28/2012    Procedure: CARDIOVERSION;  Surgeon: [name], MD;  Location: [facility] ENDOSCOPY;  Service: Cardiovascular;  Laterality: N/A;    Medications: Prior to Admission medications   Medication Sig Start Date End Date Taking? Authorizing Provider  acetaminophen (TYLENOL) 650 MG CR tablet Take 650 mg by mouth 2 (two) times daily as needed for pain.    Yes Historical Provider, MD  allopurinol (ZYLOPRIM) 100 MG tablet Take 1 tablet (100 mg total) by mouth daily. 02/20/13  Yes Agapito Games, MD  amiodarone (PACERONE) 200 MG tablet Take 100 mg by mouth every evening. 03/05/13  Yes [name], MD  carvedilol (COREG) 6.25 MG tablet Take 6.25 mg by mouth 2 (two) times daily with a meal.   Yes Historical Provider, MD  cholecalciferol (VITAMIN D) 1000 UNITS tablet Take 1,000 Units by mouth daily.   Yes Historical Provider, MD  ferrous sulfate 325 (65 FE) MG tablet Take 325 mg by mouth every evening.   Yes Historical Provider, MD  fish oil-omega-3 fatty acids 1000 MG capsule Take 1 g by mouth 2 (two) times daily.    Yes Historical Provider, MD  FLAXSEED, LINSEED, PO Take 1 tablet by mouth at bedtime.    Yes Historical Provider, MD  folic acid (FOLVITE) 400 MCG tablet Take 400 mcg by mouth daily.    Yes Historical Provider, MD  furosemide (LASIX) 40 MG tablet Take 20-40 mg by mouth 3 (three) times daily. 1 tab  In the am, 1/2 tab at lunch, and 1 tab at bedtime 10/16/12  Yes Roger A Arguello, PA-C  hydrALAZINE (APRESOLINE) 25 MG tablet Take 12.5 mg by mouth 3 (three) times daily. 03/05/13  Yes [name], MD  isosorbide mononitrate (IMDUR) 60 MG 24 hr tablet Take 1 tablet (60 mg total) by mouth daily. 03/05/13  Yes [name], MD  Multiple Vitamin (MULITIVITAMIN WITH MINERALS) TABS Take 1 tablet by mouth daily.   Yes Historical Provider, MD  Multiple Vitamins-Minerals (ICAPS AREDS FORMULA PO) Take 1  capsule by mouth every evening.   Yes Historical Provider, MD  nitroGLYCERIN (NITROSTAT) 0.4 MG SL tablet Place 0.4 mg under the tongue every 5 (five) minutes as needed for chest pain.    Yes Historical Provider, MD  potassium chloride SA (K-DUR,KLOR-CON) 20 MEQ tablet Take 1 tablet (20 mEq total) by mouth daily. 11/11/12  Yes [name], MD  Tamsulosin HCl (FLOMAX) 0.4 MG CAPS Take 0.4 mg by mouth every evening.   Yes Historical Provider, MD  warfarin (COUMADIN) 5 MG tablet Take 2.5-5 mg by mouth at bedtime. Weds only-1 tab (5 mg); All other days 0.5 half tab (2.5 mg) 01/03/13  Yes Agapito Games, MD    Allergies:  No Known Allergies  Social History:  reports that he quit smoking about 44 years ago. His smoking use included Cigarettes and Cigars. He has a 15 pack-year smoking history. He does not have any smokeless tobacco history on file. He reports that he does not drink alcohol or use illicit drugs.  Family History: Family History  Problem Relation Age of Onset  . Colon cancer Mother     colon  . Heart attack Father   . Hypertension Father   .  Heart disease Sister   . Hypertension Sister   . Heart disease Brother   . Hypertension Brother   . Heart disease Sister   . Hypertension Sister   . Heart disease Sister   . Hypertension Sister   . Heart disease Brother   . Hypertension Brother   . Heart disease Brother   . Hypertension Brother   . Heart disease Brother   . Hypertension Brother   . Heart disease Brother   . Hypertension Brother   . Heart disease Brother   . Hypertension Brother   . Heart disease Brother   . Hypertension Brother   . Heart disease Brother   . Hypertension Brother   . Stroke Brother     Physical Exam: Blood pressure 116/59, pulse 66, resp. rate 16, SpO2 100.00%. General: Alert, awake, oriented x3, in no acute distress. HEENT: normocephalic, atraumatic, anicteric sclera, pink conjunctiva, PERLA, oropharynx clear Neck: supple, no masses  or lymphadenopathy, no goiter, no bruits  Heart: Regular rate and rhythm, without murmurs, rubs or gallops. Lungs: Clear to auscultation bilaterally, no wheezing, rales or rhonchi. Abdomen: Soft, nontender, nondistended, positive bowel sounds, no masses. Right lower quadrant ileostomy Extremities: No clubbing, cyanosis or edema with positive pedal pulses. Neuro: Grossly intact, no focal neurological deficits, strength 5/5 upper and lower extremities bilaterally Psych: alert and oriented x 3, normal mood and affect Skin: no rashes or lesions, warm and dry   LABS on Admission:  Basic Metabolic Panel:  Recent Labs Lab 04/30/13 1235  NA 135  K 3.7  CL 102  CO2 23  GLUCOSE 94  BUN 35*  CREATININE 2.67*  CALCIUM 9.6   Liver Function Tests:  Recent Labs Lab 04/30/13 1235  AST 28  ALT 24  ALKPHOS 73  BILITOT 0.6  PROT 6.6  ALBUMIN 2.3*   No results found for this basename: LIPASE, AMYLASE,  in the last 168 hours No results found for this basename: AMMONIA,  in the last 168 hours CBC:  Recent Labs Lab 04/30/13 1235  WBC 12.0*  NEUTROABS 10.2*  HGB 7.9*  HCT 24.0*  MCV 77.7*  PLT 187   Cardiac Enzymes:  Recent Labs Lab 04/30/13 1235  TROPONINI <0.30   BNP: No components found with this basename: POCBNP,  CBG: No results found for this basename: GLUCAP,  in the last 168 hours   Radiological Exams on Admission: Ct Abdomen Pelvis Wo Contrast  04/30/2013  *RADIOLOGY REPORT*  Clinical Data: Bilateral flank pain.  History of bowel resection.  CT ABDOMEN AND PELVIS WITHOUT CONTRAST  Technique:  Multidetector CT imaging of the abdomen and pelvis was performed following the standard protocol without intravenous contrast.  Comparison: 08/06/2010  Findings: 60 mm well-defined low density lesion in the dome of the left liver is stable, compatible with a cyst.  Tiny adjacent 5 mm stable cyst is evident.  The spleen has normal uninfused features. The stomach, duodenum,  pancreas, gallbladder, and right adrenal gland are unremarkable.  16 mm left adrenal nodule is unchanged, consistent with benign process, probably adrenal adenoma.  2.5 cm exophytic lesion from the upper pole of the right kidney has attenuation slightly higher than would be expected for a simple cyst.  A 13 mm high density lesion is also seen in the upper pole of the right kidney.  Other exophytic lesions of varying attenuation and size are distributed throughout the right kidney. 5 mm stone is identified in the interpolar region.  The right ureter is dilated to  its mid point where a 13 x 10 x 11 mm stone is identified.  A second 12 x 10 x 11 mm stone is identified in the distal right ureter, at the level of the iliac vessels.  6 mm stone is identified in the posterior bladder.  The left kidney has multiple lesions of varying size and attenuation, some of which are too dense to represent simple cyst.  No abdominal aortic aneurysm although there is dense atherosclerotic calcification in the abdominal aorta which measures up to 2.6 cm in diameter.  No free fluid or lymphadenopathy in the abdomen.  Imaging through the pelvis shows a right lower quadrant ileostomy with parastomal herniation of small bowel.  No evidence for incarceration or obstruction.  No pelvic sidewall lymphadenopathy.  Prostate gland is enlarged. There is slight asymmetric wall thickening in the left bladder. The patient is status post colectomy with presumed scarring in the presacral space.  Bone windows reveal no worrisome lytic or sclerotic osseous lesions.  IMPRESSION: Moderate to advanced right-sided hydronephrosis secondary to a pair of ureteral stone is identified at the level of the mid and mid - distal ureter.  There is also a stone identified in the bladder lumen.  Numerous bilateral renal lesions of varying attenuation in size. Some of these image compatibly with cysts but others have attenuation too high to represent simple cysts and  soft tissue lesions could have the same imaging features.  MRI of the abdomen without and with contrast would be the study of choice to further evaluate.  Right lower quadrant ileostomy with parastomal herniation of small bowel.  No complicating features at this time.  Mild asymmetric and relatively diffuse thickening of the left bladder wall, a finding of indeterminate etiology. This is likely chronic and is similar appearance was noted previously when the patient was noted to have a large bladder stone measuring up to 4 cm.   Original Report Authenticated By: Kennith Center, M.D.    Ct Head Wo Contrast  04/30/2013  *RADIOLOGY REPORT*  Clinical Data: Syncopal episode.  CT HEAD WITHOUT CONTRAST  Technique:  Contiguous axial images were obtained from the base of the skull through the vertex without contrast.  Comparison: None.  Findings: No intracranial hemorrhage.  No CT evidence of large acute infarct.  Global atrophy.  No intracranial mass lesion detected on this unenhanced exam.  Small vessel disease type changes.  IMPRESSION: No intracranial hemorrhage or CT evidence of large acute infarct.   Original Report Authenticated By: [name], M.D.     Assessment/Plan Principal Problem:   Bradycardia, severe sinus with syncopal episode with history of atrial fibrillation - Will admit to step down for closer monitoring, obtain serial troponins, TSH - Hold amiodarone and beta blocker, 2-D echo, will likely need EP study   - Cardiology consultation called, patient follows Dr. [name] - Patient is on Coumadin, will continue, if FOBT is negative, severe anemia, r/o bleeding  Active Problems:   Essential hypertension, benign with history of coronary disease/mitral regurgitation: currently BP soft and borderline  - Hold beta blocker, Imdur     ULCERATIVE COLITIS, HX OF: With history of right lower quadrant ileostomy - No bleeding noted in ileostomy    ANEMIA, IRON DEFICIENCY: - Hemoglobin down to 7.9  today, transfuse 2 units of packed rbc's, anemia panel, stool occult test    Chronic systolic CHF (congestive heart failure)   Paroxysmal atrial fibrillation    Hydronephrosis, right with nephrolithiasis and large bladder stone: - Discussed  with urology, Dr. Lenn Sink, will order a stat UA and urine culture to rule out any UTI - Dr. Lenn Sink will see the patient for further recommendations   CKD Stage III: - Renal service is following outpatient (Dr. [name]), currently creatinine is stable at 2.6, may need epo, he's not on dialysis. I have paged renal service, awaiting response.    DVT prophylaxis: On warfarin  CODE STATUS: FULL CODE STATUS  Further plan will depend as patient's clinical course evolves and further radiologic and laboratory data become available.   Time Spent on Admission: 1 Hour   RAI,RIPUDEEP M.D. [facility] 04/30/2013, 2:55 PM Pager: [phone]  If 7PM-7AM, please contact night-coverage [email or web address] Password [email or web address]

## 2013-05-01 ENCOUNTER — Telehealth: Payer: Self-pay | Admitting: Cardiology

## 2013-05-01 DIAGNOSIS — Z5189 Encounter for other specified aftercare: Secondary | ICD-10-CM

## 2013-05-01 DIAGNOSIS — I059 Rheumatic mitral valve disease, unspecified: Secondary | ICD-10-CM

## 2013-05-01 DIAGNOSIS — T50905A Adverse effect of unspecified drugs, medicaments and biological substances, initial encounter: Secondary | ICD-10-CM

## 2013-05-01 LAB — TYPE AND SCREEN: Antibody Screen: NEGATIVE

## 2013-05-01 LAB — CBC
Hemoglobin: 9.1 g/dL — ABNORMAL LOW (ref 13.0–17.0)
MCV: 77.5 fL — ABNORMAL LOW (ref 78.0–100.0)
Platelets: 189 10*3/uL (ref 150–400)
RBC: 3.47 MIL/uL — ABNORMAL LOW (ref 4.22–5.81)
WBC: 9.9 10*3/uL (ref 4.0–10.5)

## 2013-05-01 LAB — BASIC METABOLIC PANEL
CO2: 21 mEq/L (ref 19–32)
Calcium: 9 mg/dL (ref 8.4–10.5)
Potassium: 3.5 mEq/L (ref 3.5–5.1)
Sodium: 136 mEq/L (ref 135–145)

## 2013-05-01 LAB — URINE CULTURE: Culture: NO GROWTH

## 2013-05-01 LAB — IRON AND TIBC

## 2013-05-01 LAB — VITAMIN B12: Vitamin B-12: 778 pg/mL (ref 211–911)

## 2013-05-01 LAB — FOLATE: Folate: >20 ng/mL

## 2013-05-01 LAB — RETICULOCYTES: Retic Ct Pct: 1.8 % (ref 0.4–3.1)

## 2013-05-01 MED ORDER — OMEGA-3-ACID ETHYL ESTERS 1 G PO CAPS: 1.0000 g | ORAL_CAPSULE | Freq: Every day | ORAL | Status: DC

## 2013-05-01 NOTE — Discharge Summary (Signed)
Physician Discharge Summary  [name] [name]:[id number] DOB: Feb 24, 1930 DOA: 04/30/2013  PCP: [name] Gasser, MD  Admit date: 04/30/2013 Discharge date: 05/01/2013  Time spent: >45 minutes  Discharge Diagnoses:  Principal Problem:   Bradycardia, severe sinus/Syncope and collapse Active Problems:    Adverse drug reaction- to [facility]   Essential hypertension, benign   CAD   ULCERATIVE COLITIS, HX OF   ANEMIA, IRON DEFICIENCY   Mitral regurgitation   Chronic systolic CHF (congestive heart failure)   Paroxysmal atrial fibrillation   Hydronephrosis, right    Discharge Condition: stable  Diet recommendation: heart healthy  Filed Weights   04/30/13 1559  Weight: 74.1 kg (163 lb 5.8 oz)    History of present illness:  Patient is a 79 year old male with multiple medical problems including coronary artery disease, ulcerative colitis with colostomy, hypertension, hyperlipidemia, CAD, paroxysmal atrial fibrillation, chronic anemia, mitral regurgitation was at short stay today getting intravenous iron when he became unresponsive. History was obtained from the patient as well as review of the records of rapid response at short stay. After the second dose of IV iron, at 10:20 AM, patient became unresponsive for approximately 30 seconds and they were unable to obtain a BP. His heart rate was bradycardiac in high 20 to low 30s with pauses. When patient became arousable spontaneously, he complained of severe lower back/abdominal pain after the syncopal episode which has completely resolved now. He was brought to the ED,his heart rate has been in 50-60's and he has no complaints of chest pain, diaphoresis, nausea, vomiting. His hemoglobin today was 7.9. He denies any bleeding in the colostomy bag. Patient reports that he did have hematuria sometime ago and he was prescribed antibiotics and this has completely resolved. He sees urologist Dr. Vernie [name].    Hospital Course:  Bradycardia, severe  sinus with syncopal episode with history of atrial fibrillation  - Patient was admitted to the step down unit for close monitoring - Hold amiodarone and beta blocker-in addition due to hypotension hydralazine and Imdur were held as well - 2-D echo, is unchanged from previous echo and reveals EF of 40-45% - Cardiology consultation called, patient follows Dr. Shirlee [name]    Active Problems:  Essential hypertension, benign with history of coronary disease/mitral regurgitation: currently BP soft and borderline  - -Will not resume beta blocker hydralazine or Imdur at discharge due to hypotension -Have asked him to check his blood pressure daily and resume 1 antihypertensive daily in the following fashion: Hydralazine first, Coreg second, Imdur third  ULCERATIVE COLITIS, HX OF: With history of right lower quadrant ileostomy  - No bleeding noted in ileostomy   ANEMIA, IRON DEFICIENCY:  - Hemoglobin down to 7.9 today, transfuse 2 units of packed rbc's, anemia panel, stool occult test   Chronic systolic CHF (congestive heart failure)   Paroxysmal atrial fibrillation   Hydronephrosis, right with nephrolithiasis and large bladder stone:  - Discussed with urology-and evaluated by Dr [name] [name] -Outpatient followup recommended with possible placement of double-J stent followed by lithotripsy -Urology does not feel at this time that the patient has a urinary tract infection and therefore antibiotics were discontinued  CKD Stage III:  - Renal service is following outpatient (Dr. Lacy [name]), currently creatinine is stable at 2.6,he's not on dialysis and as mentioned by Dr [name] [name] he is not yet on Epo.   Consultations:  Urology  Nephrology  Cardiology  Discharge Exam: Filed Vitals:   04/30/13 2345 05/01/13 0407 05/01/13 0855 05/01/13 0900  BP: 145/67 139/61 113/45  Pulse: 72 73 73   Temp: 99.5 F (37.5 C) 99.6 F (37.6 C)  98.5 F (36.9 C)  TempSrc: Oral Oral  Oral  Resp: 23 14 16     Height:      Weight:      SpO2: 99% 99% 100%     General: AAO x3, no acute distress Cardiovascular: RRR, no murmurs Respiratory: CTA b/l   Discharge Instructions      Discharge Orders   Future Appointments Provider Department Dept Phone   07/17/2013 2:30 PM [name] Morale, MD [facility] Main [facility] [place]) [phone]   Future Orders Complete By Expires     Diet - low sodium heart healthy  As directed     Diet - low sodium heart healthy  As directed     Discharge instructions  As directed     Comments:      Check BP daily. Resume BP meds one at a time (one daily) starting with Hydralazine followed by Coreg and then Imdur as long as BP does not drop below 120/80- if you have any questions, contact your primary care physician or cardiologist.    Increase activity slowly  As directed         Medication List    STOP taking these medications       carvedilol 6.25 MG tablet  Commonly known as:  COREG     hydrALAZINE 25 MG tablet  Commonly known as:  APRESOLINE     isosorbide mononitrate 60 MG 24 hr tablet  Commonly known as:  IMDUR      TAKE these medications       acetaminophen 650 MG CR tablet  Commonly known as:  TYLENOL  Take 650 mg by mouth 2 (two) times daily as needed for pain.     allopurinol 100 MG tablet  Commonly known as:  ZYLOPRIM  Take 1 tablet (100 mg total) by mouth daily.     amiodarone 200 MG tablet  Commonly known as:  PACERONE  Take 100 mg by mouth every evening.     cholecalciferol 1000 UNITS tablet  Commonly known as:  VITAMIN D  Take 1,000 Units by mouth daily.     ferrous sulfate 325 (65 FE) MG tablet  Take 325 mg by mouth every evening.     fish oil-omega-3 fatty acids 1000 MG capsule  Take 1 g by mouth 2 (two) times daily.     FLAXSEED (LINSEED) PO  Take 1 tablet by mouth at bedtime.     folic acid 400 MCG tablet  Commonly known as:  FOLVITE  Take 400 mcg by mouth daily.     furosemide 40 MG tablet  Commonly known  as:  LASIX  Take 20-40 mg by mouth 3 (three) times daily. 1 tab  In the am, 1/2 tab at lunch, and 1 tab at bedtime     ICAPS AREDS FORMULA PO  Take 1 capsule by mouth every evening.     multivitamin with minerals Tabs  Take 1 tablet by mouth daily.     nitroGLYCERIN 0.4 MG SL tablet  Commonly known as:  NITROSTAT  Place 0.4 mg under the tongue every 5 (five) minutes as needed for chest pain.     omega-3 acid ethyl esters 1 G capsule  Commonly known as:  LOVAZA  Take 1 capsule (1 g total) by mouth daily.     potassium chloride SA 20 MEQ tablet  Commonly known as:  K-DUR,KLOR-CON  Take 1  tablet (20 mEq total) by mouth daily.     tamsulosin 0.4 MG Caps  Commonly known as:  FLOMAX  Take 0.4 mg by mouth every evening.     warfarin 5 MG tablet  Commonly known as:  COUMADIN  Take 2.5-5 mg by mouth at bedtime. Weds only-1 tab (5 mg); All other days 0.5 half tab (2.5 mg)       No Known Allergies Follow-up Information   Follow up with Garnett Farm, MD. (Call to schedule your stent placement surgery.)    Contact information:   [address] [place] [address] [phone]       Follow up with [name], MD In 3 days.   Contact information:   [address] [place] [address] [phone]        The results of significant diagnostics from this hospitalization (including imaging, microbiology, ancillary and laboratory) are listed below for reference.    Significant Diagnostic Studies: Ct Abdomen Pelvis Wo Contrast  04/30/2013  *RADIOLOGY REPORT*  Clinical Data: Bilateral flank pain.  History of bowel resection.  CT ABDOMEN AND PELVIS WITHOUT CONTRAST  Technique:  Multidetector CT imaging of the abdomen and pelvis was performed following the standard protocol without intravenous contrast.  Comparison: 08/06/2010  Findings: 60 mm well-defined low density lesion in the dome of the left liver is stable, compatible with a  cyst.  Tiny adjacent 5 mm stable cyst is evident.  The spleen has normal uninfused features. The stomach, duodenum, pancreas, gallbladder, and right adrenal gland are unremarkable.  16 mm left adrenal nodule is unchanged, consistent with benign process, probably adrenal adenoma.  2.5 cm exophytic lesion from the upper pole of the right kidney has attenuation slightly higher than would be expected for a simple cyst.  A 13 mm high density lesion is also seen in the upper pole of the right kidney.  Other exophytic lesions of varying attenuation and size are distributed throughout the right kidney. 5 mm stone is identified in the interpolar region.  The right ureter is dilated to its mid point where a 13 x 10 x 11 mm stone is identified.  A second 12 x 10 x 11 mm stone is identified in the distal right ureter, at the level of the iliac vessels.  6 mm stone is identified in the posterior bladder.  The left kidney has multiple lesions of varying size and attenuation, some of which are too dense to represent simple cyst.  No abdominal aortic aneurysm although there is dense atherosclerotic calcification in the abdominal aorta which measures up to 2.6 cm in diameter.  No free fluid or lymphadenopathy in the abdomen.  Imaging through the pelvis shows a right lower quadrant ileostomy with parastomal herniation of small bowel.  No evidence for incarceration or obstruction.  No pelvic sidewall lymphadenopathy.  Prostate gland is enlarged. There is slight asymmetric wall thickening in the left bladder. The patient is status post colectomy with presumed scarring in the presacral space.  Bone windows reveal no worrisome lytic or sclerotic osseous lesions.  IMPRESSION: Moderate to advanced right-sided hydronephrosis secondary to a pair of ureteral stone is identified at the level of the mid and mid - distal ureter.  There is also a stone identified in the bladder lumen.  Numerous bilateral renal lesions of varying attenuation in  size. Some of these image compatibly with cysts but others have attenuation too high to represent simple cysts and soft tissue  lesions could have the same imaging features.  MRI of the abdomen without and with contrast would be the study of choice to further evaluate.  Right lower quadrant ileostomy with parastomal herniation of small bowel.  No complicating features at this time.  Mild asymmetric and relatively diffuse thickening of the left bladder wall, a finding of indeterminate etiology. This is likely chronic and is similar appearance was noted previously when the patient was noted to have a large bladder stone measuring up to 4 cm.   Original Report Authenticated By: Kennith Center, M.D.    Ct Head Wo Contrast  04/30/2013  *RADIOLOGY REPORT*  Clinical Data: Syncopal episode.  CT HEAD WITHOUT CONTRAST  Technique:  Contiguous axial images were obtained from the base of the skull through the vertex without contrast.  Comparison: None.  Findings: No intracranial hemorrhage.  No CT evidence of large acute infarct.  Global atrophy.  No intracranial mass lesion detected on this unenhanced exam.  Small vessel disease type changes.  IMPRESSION: No intracranial hemorrhage or CT evidence of large acute infarct.   Original Report Authenticated By: [name] [name], M.D.     Microbiology: Recent Results (from the past 240 hour(s))  MRSA PCR SCREENING     Status: None   Collection Time    04/30/13  4:15 PM      Result Value Range Status   MRSA by PCR NEGATIVE  NEGATIVE Final   Comment:            The GeneXpert MRSA Assay (FDA     approved for NASAL specimens     only), is one component of a     comprehensive MRSA colonization     surveillance program. It is not     intended to diagnose MRSA     infection nor to guide or     monitor treatment for     MRSA infections.     Labs: Basic Metabolic Panel:  Recent Labs Lab 04/30/13 1235 05/01/13 0337  NA 135 136  K 3.7 3.5  CL 102 103  CO2 23 21   GLUCOSE 94 87  BUN 35* 38*  CREATININE 2.67* 2.49*  CALCIUM 9.6 9.0   Liver Function Tests:  Recent Labs Lab 04/30/13 1235  AST 28  ALT 24  ALKPHOS 73  BILITOT 0.6  PROT 6.6  ALBUMIN 2.3*   No results found for this basename: LIPASE, AMYLASE,  in the last 168 hours No results found for this basename: AMMONIA,  in the last 168 hours CBC:  Recent Labs Lab 04/30/13 1235 05/01/13 0337  WBC 12.0* 9.9  NEUTROABS 10.2*  --   HGB 7.9* 9.1*  HCT 24.0* 26.9*  MCV 77.7* 77.5*  PLT 187 189   Cardiac Enzymes:  Recent Labs Lab 04/30/13 1235 05/01/13 0337  TROPONINI <0.30 <0.30   BNP: BNP (last 3 results)  Recent Labs  10/03/12 0903 10/12/12 1212 03/05/13 1208  PROBNP 187.0* 5468.0* 557.0*   CBG: No results found for this basename: GLUCAP,  in the last 168 hours     Signed:  Legrand Lasser  [facility] 05/01/2013, 4:49 PM

## 2013-05-01 NOTE — Consult Note (Signed)
Subjective: Patient reports some low back pain but no localizing right flank pain or new voiding complaints.  Objective: Vital signs in last 24 hours: Temp:  [97.5 F (36.4 C)-99.6 F (37.6 C)] 99.6 F (37.6 C) (05/08 0407) Pulse Rate:  [50-73] 73 (05/08 0407) Resp:  [11-23] 14 (05/08 0407) BP: (79-147)/(44-108) 139/61 mmHg (05/08 0407) SpO2:  [95 %-100 %] 99 % (05/08 0407) Weight:  [74.1 kg (163 lb 5.8 oz)] 74.1 kg (163 lb 5.8 oz) (05/07 1559)  Intake/Output from previous day: 05/07 0701 - 05/08 0700 In: 922.5 [P.O.:360; I.V.:250; Blood:262.5; IV Piggyback:50] Out: 650 [Urine:650] Intake/Output this shift  Lab Results:  Recent Labs  04/30/13 1235 05/01/13 0337  HGB 7.9* 9.1*  HCT 24.0* 26.9*   BMET  Recent Labs  04/30/13 1235 05/01/13 0337  NA 135 136  K 3.7 3.5  CL 102 103  CO2 23 21  GLUCOSE 94 87  BUN 35* 38*  CREATININE 2.67* 2.49*  CALCIUM 9.6 9.0    Recent Labs  04/30/13 1235  INR 1.82*   No results found for this basename: LABURIN,  in the last 72 hours Results for orders placed during the hospital encounter of 04/30/13  MRSA PCR SCREENING     Status: None   Collection Time    04/30/13  4:15 PM      Result Value Range Status   MRSA by PCR NEGATIVE  NEGATIVE Final   Comment:            The GeneXpert MRSA Assay (FDA     approved for NASAL specimens     only), is one component of a     comprehensive MRSA colonization     surveillance program. It is not     intended to diagnose MRSA     infection nor to guide or     monitor treatment for     MRSA infections.    Studies/Results: Ct Abdomen Pelvis Wo Contrast  04/30/2013  *RADIOLOGY REPORT*  Clinical Data: Bilateral flank pain.  History of bowel resection.  CT ABDOMEN AND PELVIS WITHOUT CONTRAST  Technique:  Multidetector CT imaging of the abdomen and pelvis was performed following the standard protocol without intravenous contrast.  Comparison: 08/06/2010  Findings: 60 mm well-defined low  density lesion in the dome of the left liver is stable, compatible with a cyst.  Tiny adjacent 5 mm stable cyst is evident.  The spleen has normal uninfused features. The stomach, duodenum, pancreas, gallbladder, and right adrenal gland are unremarkable.  16 mm left adrenal nodule is unchanged, consistent with benign process, probably adrenal adenoma.  2.5 cm exophytic lesion from the upper pole of the right kidney has attenuation slightly higher than would be expected for a simple cyst.  A 13 mm high density lesion is also seen in the upper pole of the right kidney.  Other exophytic lesions of varying attenuation and size are distributed throughout the right kidney. 5 mm stone is identified in the interpolar region.  The right ureter is dilated to its mid point where a 13 x 10 x 11 mm stone is identified.  A second 12 x 10 x 11 mm stone is identified in the distal right ureter, at the level of the iliac vessels.  6 mm stone is identified in the posterior bladder.  The left kidney has multiple lesions of varying size and attenuation, some of which are too dense to represent simple cyst.  No abdominal aortic aneurysm although there is dense atherosclerotic  calcification in the abdominal aorta which measures up to 2.6 cm in diameter.  No free fluid or lymphadenopathy in the abdomen.  Imaging through the pelvis shows a right lower quadrant ileostomy with parastomal herniation of small bowel.  No evidence for incarceration or obstruction.  No pelvic sidewall lymphadenopathy.  Prostate gland is enlarged. There is slight asymmetric wall thickening in the left bladder. The patient is status post colectomy with presumed scarring in the presacral space.  Bone windows reveal no worrisome lytic or sclerotic osseous lesions.  IMPRESSION: Moderate to advanced right-sided hydronephrosis secondary to a pair of ureteral stone is identified at the level of the mid and mid - distal ureter.  There is also a stone identified in the  bladder lumen.  Numerous bilateral renal lesions of varying attenuation in size. Some of these image compatibly with cysts but others have attenuation too high to represent simple cysts and soft tissue lesions could have the same imaging features.  MRI of the abdomen without and with contrast would be the study of choice to further evaluate.  Right lower quadrant ileostomy with parastomal herniation of small bowel.  No complicating features at this time.  Mild asymmetric and relatively diffuse thickening of the left bladder wall, a finding of indeterminate etiology. This is likely chronic and is similar appearance was noted previously when the patient was noted to have a large bladder stone measuring up to 4 cm.   Original Report Authenticated By: Kennith Center, M.D.    Ct Head Wo Contrast  04/30/2013  *RADIOLOGY REPORT*  Clinical Data: Syncopal episode.  CT HEAD WITHOUT CONTRAST  Technique:  Contiguous axial images were obtained from the base of the skull through the vertex without contrast.  Comparison: None.  Findings: No intracranial hemorrhage.  No CT evidence of large acute infarct.  Global atrophy.  No intracranial mass lesion detected on this unenhanced exam.  Small vessel disease type changes.  IMPRESSION: No intracranial hemorrhage or CT evidence of large acute infarct.   Original Report Authenticated By: [name], M.D.     Assessment/Plan: He has 2 large stones in his right ureter with some associated hydronephrosis. He has been having some low back pain which does not sound like renal colic. He describes a dull aching low back pain in the midline that is worsened by working out in the chart. This is almost certainly musculoskeletal in origin. His creatinine has changed very little from his baseline despite his right ureteral calculi and hydronephrosis on that side indicating that the small amount of renal parenchyma left on that side is not contributing a great deal to his overall renal  function. That being said he does have renal insufficiency and maintaining as much functioning renal tissue as possible is important.  I discussed with the patient the options which would include ureteroscopy with laser lithotripsy versus extracorporeal shockwave lithotripsy. I gone over each of the procedure with him in detail including the pluses and minus, risks and complications and probability of success. I think in this situation, because of the large size of his stones that he would be best served by placement of a double-J stent followed by lithotripsy. He understands the possible need for a repeat procedure in order to treat both of the stones.   It is my understanding he is nearing discharge and therefore I will have him followup with me as an outpatient to schedule stent placement and lithotripsy.    LOS: 1 day   Sheela Mcculley C 05/01/2013,  7:25 AM

## 2013-05-01 NOTE — Progress Notes (Signed)
UR complete.  Braylan Faul RN, MSN 

## 2013-05-01 NOTE — Telephone Encounter (Signed)
New problem    Per pt was seen in the ER and was told to call & f/u w/dr mclean-pt calling because he says he needs to be seen soon

## 2013-05-01 NOTE — Telephone Encounter (Signed)
I called the pt's number and it states that voice mail box cannot take messages and this time and to call back.  The pt was evaluated by Dr Elease Hashimoto 04/30/13 in the hospital and per his consult note the pt can keep regular scheduled follow-up with Dr [name].

## 2013-05-02 NOTE — Care Management Note (Signed)
    Page 1 of 1   05/02/2013     8:54:16 AM   CARE MANAGEMENT NOTE 05/02/2013  Patient:  [name] [name],[name] [name]   Account Number:  [id number]  Date Initiated:  05/01/2013  Documentation initiated by:  Elmer Bales  Subjective/Objective Assessment:   Pt admitted for severe bradycardia, anemia noted while recieving iron transfusion.     Action/Plan:   Pt lives at home with wife, anticipate return home.   Anticipated DC Date:  05/02/2013   Anticipated DC Plan:  HOME/SELF CARE      DC Planning Services  CM consult      Choice offered to / List presented to:             Status of service:  Completed, signed off Medicare Important Message given?   (If response is "NO", the following Medicare IM given date fields will be blank) Date Medicare IM given:   Date Additional Medicare IM given:    Discharge Disposition:  HOME/SELF CARE  Per UR Regulation:  Reviewed for med. necessity/level of care/duration of stay  If discussed at Long Length of Stay Meetings, dates discussed:    Comments:

## 2013-05-02 NOTE — Telephone Encounter (Signed)
He may hold medications as requested prior to cystoscopy.

## 2013-05-02 NOTE — Telephone Encounter (Signed)
New Prob     Pt has a kidney stone and procedure (cisto right stent placement & right lipitotripsy) calling to see if coumadin can be held 5 days and aspirin 3 days. Please call.

## 2013-05-02 NOTE — Telephone Encounter (Signed)
Pam from Dr. Margrett Rud office Would like to know if Pt can stop taken Coumadin for 5 days and Asprin for 3 days prior Cysto right stent placement and right Lithotripsy. Pt will be scheduled procedure pending clearance from cardiologist.

## 2013-05-05 ENCOUNTER — Emergency Department (HOSPITAL_BASED_OUTPATIENT_CLINIC_OR_DEPARTMENT_OTHER)
Admission: EM | Admit: 2013-05-05 | Discharge: 2013-05-05 | Disposition: A | Discharge status: Home / Self Care | Payer: Medicare Other | Attending: Emergency Medicine | Admitting: Emergency Medicine

## 2013-05-05 ENCOUNTER — Emergency Department (HOSPITAL_BASED_OUTPATIENT_CLINIC_OR_DEPARTMENT_OTHER): Payer: Medicare Other

## 2013-05-05 ENCOUNTER — Encounter (HOSPITAL_BASED_OUTPATIENT_CLINIC_OR_DEPARTMENT_OTHER): Payer: Self-pay | Admitting: Family Medicine

## 2013-05-05 DIAGNOSIS — Z7901 Long term (current) use of anticoagulants: Secondary | ICD-10-CM | POA: Insufficient documentation

## 2013-05-05 DIAGNOSIS — Z951 Presence of aortocoronary bypass graft: Secondary | ICD-10-CM | POA: Insufficient documentation

## 2013-05-05 DIAGNOSIS — M109 Gout, unspecified: Secondary | ICD-10-CM

## 2013-05-05 DIAGNOSIS — Z87891 Personal history of nicotine dependence: Secondary | ICD-10-CM | POA: Insufficient documentation

## 2013-05-05 DIAGNOSIS — Z79899 Other long term (current) drug therapy: Secondary | ICD-10-CM | POA: Insufficient documentation

## 2013-05-05 DIAGNOSIS — I10 Essential (primary) hypertension: Secondary | ICD-10-CM | POA: Insufficient documentation

## 2013-05-05 DIAGNOSIS — Z87448 Personal history of other diseases of urinary system: Secondary | ICD-10-CM | POA: Insufficient documentation

## 2013-05-05 DIAGNOSIS — I509 Heart failure, unspecified: Secondary | ICD-10-CM | POA: Insufficient documentation

## 2013-05-05 DIAGNOSIS — I2510 Atherosclerotic heart disease of native coronary artery without angina pectoris: Secondary | ICD-10-CM | POA: Insufficient documentation

## 2013-05-05 DIAGNOSIS — E785 Hyperlipidemia, unspecified: Secondary | ICD-10-CM | POA: Insufficient documentation

## 2013-05-05 DIAGNOSIS — Z8679 Personal history of other diseases of the circulatory system: Secondary | ICD-10-CM | POA: Insufficient documentation

## 2013-05-05 DIAGNOSIS — IMO0001 Reserved for inherently not codable concepts without codable children: Secondary | ICD-10-CM | POA: Insufficient documentation

## 2013-05-05 DIAGNOSIS — I252 Old myocardial infarction: Secondary | ICD-10-CM | POA: Insufficient documentation

## 2013-05-05 DIAGNOSIS — D649 Anemia, unspecified: Secondary | ICD-10-CM | POA: Insufficient documentation

## 2013-05-05 MED ORDER — HYDROCODONE-ACETAMINOPHEN 5-325 MG PO TABS: 1.0000 | ORAL_TABLET | Freq: Four times a day (QID) | ORAL | Status: DC | PRN

## 2013-05-05 MED ORDER — HYDROCODONE-ACETAMINOPHEN 5-325 MG PO TABS
1.0000 | ORAL_TABLET | Freq: Once | ORAL | Status: AC
  Administered 2013-05-05: 1 via ORAL
  Filled 2013-05-05: qty 1

## 2013-05-05 MED ORDER — PREDNISONE 10 MG PO TABS: 20.0000 mg | ORAL_TABLET | Freq: Two times a day (BID) | ORAL | Status: DC

## 2013-05-05 MED ORDER — ONDANSETRON 8 MG PO TBDP
8.0000 mg | ORAL_TABLET | Freq: Once | ORAL | Status: AC
  Administered 2013-05-05: 8 mg via ORAL
  Filled 2013-05-05: qty 1

## 2013-05-05 NOTE — ED Provider Notes (Signed)
History     CSN: [id number]  Arrival date & time 05/05/13  1478   First MD Initiated Contact with Patient 05/05/13 1009      Chief Complaint  Patient presents with  . Gout    (Consider location/radiation/quality/duration/timing/severity/associated sxs/prior treatment) HPI Comments: Patient is an 79 year old male who presents today with an acute gout flare. He states he has had gout in the past and this feels like his typical gout flares. This is worse than his normal gout flares he is in severe pain without radiation. The flares in his right index finger. It is red, swollen, hot. He states his range of motion is intact, but this does cause an increase in pain. He has previously gotten out in his big toe. He is taking allopurinol, but states this is not working. He is taking colchicine in the past which has successfully alleviated his gout. He denies fevers, chills, nausea, vomiting, abdominal pain, shortness of breath, chest pain, paresthesias, weakness.  The history is provided by the patient. No language interpreter was used.    Past Medical History  Diagnosis Date  . Coronary artery disease   . MI, old     AGE 89  . Ulcerative colitis   . Hyperlipidemia   . Hypertension   . Acute renal failure   . History of nephrolithiasis   . PAF (paroxysmal atrial fibrillation)   . LV dysfunction   . Myocardial infarction   . Anemia   . Blood transfusion   . Arthritis   . CHF (congestive heart failure)   . Mitral regurgitation     Past Surgical History  Procedure Laterality Date  . Cardiac catheterization  10/14/2010    EF 30%  . Coronary artery bypass graft  10/24/2010    LIMA TO THE LAD, A SAPHENOUS VEIN GRAFT TO THE DIAGONAL 1 AND 3, AND SAPHENOUS VEIN GRAFT TO THE DISTAL RIGHT CORONARY ARTERY  . Ileostomy    . Colectomy    . Cataract extraction    . Transthoracic echocardiogram  01/16/2011    EF 50%  . Cardiovascular stress test  10/07/2010    EF 33%  . Appendectomy    .  Cystostomy  04/22/2012    Procedure: CYSTOSTOMY SUPRAPUBIC;  Surgeon: Garnett Farm, MD;  Location: [facility];  Service: Urology;  Laterality: N/A;  CYSTOTOMY WITH MIDLINE INCISION REMOVAL OF STONE  . Tee without cardioversion  08/28/2012    Procedure: TRANSESOPHAGEAL ECHOCARDIOGRAM (TEE);  Surgeon: [name], MD;  Location: [facility] ENDOSCOPY;  Service: Cardiovascular;  Laterality: N/A;  . Cardioversion  08/28/2012    Procedure: CARDIOVERSION;  Surgeon: [name], MD;  Location: [facility] ENDOSCOPY;  Service: Cardiovascular;  Laterality: N/A;    Family History  Problem Relation Age of Onset  . Colon cancer Mother     colon  . Heart attack Father   . Hypertension Father   . Heart disease Sister   . Hypertension Sister   . Heart disease Brother   . Hypertension Brother   . Heart disease Sister   . Hypertension Sister   . Heart disease Sister   . Hypertension Sister   . Heart disease Brother   . Hypertension Brother   . Heart disease Brother   . Hypertension Brother   . Heart disease Brother   . Hypertension Brother   . Heart disease Brother   . Hypertension Brother   . Heart disease Brother   . Hypertension Brother   . Heart disease  Brother   . Hypertension Brother   . Heart disease Brother   . Hypertension Brother   . Stroke Brother     History  Substance Use Topics  . Smoking status: Former Smoker -- 1.00 packs/day for 15 years    Types: Cigarettes, Cigars    Quit date: 12/25/1968  . Smokeless tobacco: Not on file  . Alcohol Use: No     Comment: occasional glass of wine      Review of Systems  Constitutional: Negative for fever and chills.  Respiratory: Negative for shortness of breath.   Cardiovascular: Negative for chest pain.  Gastrointestinal: Negative for nausea, vomiting and abdominal pain.  Musculoskeletal: Positive for myalgias, joint swelling and arthralgias. Negative for gait problem.  All other systems reviewed and are negative.    Allergies  Review  of patient's allergies indicates no known allergies.  Home Medications   Current Outpatient Rx  Name  Route  Sig  Dispense  Refill  . acetaminophen (TYLENOL) 650 MG CR tablet   Oral   Take 650 mg by mouth 2 (two) times daily as needed for pain.          [name] allopurinol (ZYLOPRIM) 100 MG tablet   Oral   Take 1 tablet (100 mg total) by mouth daily.   90 tablet   1   . amiodarone (PACERONE) 200 MG tablet   Oral   Take 100 mg by mouth every evening.         . cholecalciferol (VITAMIN D) 1000 UNITS tablet   Oral   Take 1,000 Units by mouth daily.         . ferrous sulfate 325 (65 FE) MG tablet   Oral   Take 325 mg by mouth every evening.         . fish oil-omega-3 fatty acids 1000 MG capsule   Oral   Take 1 g by mouth 2 (two) times daily.          [name] FLAXSEED, LINSEED, PO   Oral   Take 1 tablet by mouth at bedtime.          . folic acid (FOLVITE) 400 MCG tablet   Oral   Take 400 mcg by mouth daily.          . furosemide (LASIX) 40 MG tablet   Oral   Take 20-40 mg by mouth 3 (three) times daily. 1 tab  In the am, 1/2 tab at lunch, and 1 tab at bedtime         . Multiple Vitamin (MULITIVITAMIN WITH MINERALS) TABS   Oral   Take 1 tablet by mouth daily.         . Multiple Vitamins-Minerals (ICAPS AREDS FORMULA PO)   Oral   Take 1 capsule by mouth every evening.         . nitroGLYCERIN (NITROSTAT) 0.4 MG SL tablet   Sublingual   Place 0.4 mg under the tongue every 5 (five) minutes as needed for chest pain.          [name] omega-3 acid ethyl esters (LOVAZA) 1 G capsule   Oral   Take 1 capsule (1 g total) by mouth daily.   30 capsule   0   . potassium chloride SA (K-DUR,KLOR-CON) 20 MEQ tablet   Oral   Take 1 tablet (20 mEq total) by mouth daily.   90 tablet   3   . Tamsulosin HCl (FLOMAX) 0.4 MG CAPS   Oral  Take 0.4 mg by mouth every evening.         . warfarin (COUMADIN) 5 MG tablet   Oral   Take 2.5-5 mg by mouth at bedtime. Weds  only-1 tab (5 mg); All other days 0.5 half tab (2.5 mg)           BP 142/82  Pulse 78  Resp 20  Ht 5' 4.75" (1.645 m)  Wt 161 lb (73.029 kg)  BMI 26.99 kg/m2  SpO2 97%  Physical Exam  Nursing note and vitals reviewed. Constitutional: He is oriented to person, place, and time. He appears well-developed and well-nourished. No distress.  HENT:  Head: Normocephalic and atraumatic.  Right Ear: External ear normal.  Left Ear: External ear normal.  Nose: Nose normal.  Eyes: Conjunctivae are normal.  Neck: Normal range of motion. No tracheal deviation present.  Cardiovascular: Normal rate, regular rhythm and normal heart sounds.   Pulmonary/Chest: Effort normal and breath sounds normal. No stridor.  Abdominal: Soft. He exhibits no distension. There is no tenderness.  Musculoskeletal: Normal range of motion.       Hands: Right index finger swelling, erythema, warmth, decreased ROM  neurovascularly intact  Neurological: He is alert and oriented to person, place, and time.  Skin: Skin is warm and dry. He is not diaphoretic.  Psychiatric: He has a normal mood and affect. His behavior is normal.    ED Course  Procedures (including critical care time)  Labs Reviewed - No data to display Dg Hand Complete Right  05/05/2013  *RADIOLOGY REPORT*  Clinical Data: Redness and swelling of the right index finger. History of gout.  RIGHT HAND - COMPLETE 3+ VIEW  Comparison: None.  Findings: Study is degraded by positioning of the index finger. There is no acute fracture evident.  Healed fracture of the fifth metacarpal noted.  In the index finger, there is some degenerative change in the DIP joints but punched out lesions are seen at the PIP joints and at the MCP joints.  IMPRESSION: Changes in the PIP joint of the index finger with overlying soft tissue swelling suggests gout.   Original Report Authenticated By: Kennith Center, M.D.      1. Gout attack       MDM  Patient is an 79 year old  male with history of gout. He presents today in an acute gout attack. It is in his right index finger. X-ray consistent with gout. No concern for septic joint at this time. He is given prednisone and Norco. He has a followup appointment with a hand specialist in one week. Return instructions given. I doesn't fit for discharge. Patient / Family / Caregiver informed of clinical course, understand medical decision-making process, and agree with plan.        Mora Bellman, PA-C 05/05/13 1130

## 2013-05-05 NOTE — ED Notes (Signed)
Pt c/o gout "flare up" to right index finger. Pt reports h/o same.

## 2013-05-05 NOTE — ED Provider Notes (Signed)
Medical screening examination/treatment/procedure(s) were performed by non-physician practitioner and as supervising physician I was immediately available for consultation/collaboration.  Tanisia Yokley, MD 05/05/13 1431 

## 2013-05-06 ENCOUNTER — Other Ambulatory Visit: Payer: Self-pay | Admitting: Urology

## 2013-05-06 NOTE — Telephone Encounter (Signed)
Pam Aware of MD's recommendations; message faxed to [facility], to fax # [phone].

## 2013-05-06 NOTE — Telephone Encounter (Signed)
Left pam a message with Dr. Alford Highland recommendations regarding pt's surgical clearance.

## 2013-05-08 ENCOUNTER — Encounter (HOSPITAL_COMMUNITY): Payer: Self-pay | Admitting: Pharmacy Technician

## 2013-05-08 ENCOUNTER — Encounter (HOSPITAL_COMMUNITY)
Admission: RE | Admit: 2013-05-08 | Discharge: 2013-05-08 | Disposition: A | Discharge status: Home / Self Care | Payer: Medicare Other | Source: Ambulatory Visit | Attending: Urology | Admitting: Urology

## 2013-05-08 ENCOUNTER — Encounter (HOSPITAL_COMMUNITY): Payer: Self-pay

## 2013-05-08 ENCOUNTER — Encounter (HOSPITAL_COMMUNITY): Payer: Self-pay | Admitting: *Deleted

## 2013-05-08 DIAGNOSIS — Z932 Ileostomy status: Secondary | ICD-10-CM

## 2013-05-08 DIAGNOSIS — R609 Edema, unspecified: Secondary | ICD-10-CM

## 2013-05-08 HISTORY — DX: Ileostomy status: Z93.2

## 2013-05-08 HISTORY — DX: Edema, unspecified: R60.9

## 2013-05-08 HISTORY — DX: Gout, unspecified: M10.9

## 2013-05-08 LAB — BASIC METABOLIC PANEL
BUN: 38 mg/dL — ABNORMAL HIGH (ref 6–23)
CO2: 26 mEq/L (ref 19–32)
Calcium: 10 mg/dL (ref 8.4–10.5)
Chloride: 104 mEq/L (ref 96–112)
Creatinine, Ser: 2.13 mg/dL — ABNORMAL HIGH (ref 0.50–1.35)
GFR calc Af Amer: 32 mL/min — ABNORMAL LOW (ref >90)
GFR calc non Af Amer: 28 mL/min — ABNORMAL LOW (ref >90)
Glucose, Bld: 82 mg/dL (ref 70–99)
Potassium: 4 mEq/L (ref 3.5–5.1)
Sodium: 140 mEq/L (ref 135–145)

## 2013-05-08 LAB — SURGICAL PCR SCREEN
MRSA, PCR: NEGATIVE
Staphylococcus aureus: NEGATIVE

## 2013-05-08 LAB — CBC
HCT: 32.5 % — ABNORMAL LOW (ref 39.0–52.0)
Hemoglobin: 10.3 g/dL — ABNORMAL LOW (ref 13.0–17.0)
MCH: 26.5 pg (ref 26.0–34.0)
MCHC: 31.7 g/dL (ref 30.0–36.0)
MCV: 83.5 fL (ref 78.0–100.0)
Platelets: 242 10*3/uL (ref 150–400)
RBC: 3.89 MIL/uL — ABNORMAL LOW (ref 4.22–5.81)
RDW: 20.2 % — ABNORMAL HIGH (ref 11.5–15.5)
WBC: 15.6 10*3/uL — ABNORMAL HIGH (ref 4.0–10.5)

## 2013-05-08 LAB — APTT: aPTT: 73 seconds — ABNORMAL HIGH (ref 24–37)

## 2013-05-08 NOTE — Patient Instructions (Addendum)
22 [name] [name]  05/08/2013   Your procedure is scheduled on: 5-19  -2014  Report to [facility] at   0630     AM. Reminder:You are also scheduled for Lithotripsy procedure  at 11:15 AM after your Stent has been placed.  Call this number if you have problems the morning of surgery: [phone]  Or Presurgical Testing [phone]([name])   Remember: Follow any bowel prep instructions per MD office.(Due to Ileostomy no bowel prep or laxative  needed)    Do not eat food:After Midnight.    Take these medicines the morning of surgery with A SIP OF WATER: Stop Coumadin as directed, and over the counter herbal and antiinflammatory meds.   Take Allopurinol. Amiodarone. Carvedilol. Isosorbide(if feeling weak, dizzy- take only Carvedilol and have nurses instruct you on AM of surgery once here whether others are needed).   Do not wear jewelry, make-up or nail polish.  Do not wear lotions, powders, or perfumes. You may wear deodorant.  Do not shave 12 hours prior to first CHG shower(legs and under arms).(face and neck okay.)  Do not bring valuables to the hospital.  Contacts, dentures or bridgework,body piercing,  may not be worn into surgery.  Leave suitcase in the car. After surgery it may be brought to your room.  For patients admitted to the hospital, checkout time is 11:00 AM the day of discharge.   Patients discharged the day of surgery will not be allowed to drive home. Must have responsible person with you x 24 hours once discharged.  Name and phone number of your driver:Nancy-spouse [phone]/ [phone] cell  Special Instructions: CHG(Chlorhedine 4%-"Hibiclens","Betasept","Aplicare") Shower Use Special Wash: see special instructions.(avoid face and genitals)   Please read over the following fact sheets that you were given: MRSA Information..    Failure to follow these instructions may result in Cancellation of your surgery.   Patient  signature_______________________________________________________

## 2013-05-08 NOTE — Progress Notes (Signed)
05-08-13 1620 labs viewable in Epic. Note. Coumadin stop 05-07-13-will recheck PT/INR AM of preop.

## 2013-05-08 NOTE — Pre-Procedure Instructions (Addendum)
05-08-13 EKG 5'14-report with chart. CXR 11'13 -Epic. 05-08-13 Dr. Acey Lav notified of right neck swelling base of neck"recently appeared" denies dysphagia or pain, or dyspnea. Instructed to alert PCP MD if any symptoms occur or if swelling worsens 05-08-13 Pt. Instructed on Lithotripsy procedure during PST visit-reminded to fill out all forms in blue folder and bring. Has stopped Coumadin .Has ileostomy-no laxative required. 05-08-13 1620 Labs viewable in Epic- faxed to Dr. [name].

## 2013-05-11 NOTE — H&P (Signed)
History of Present Illness      Bilateral nephrolithiasis: He has a long history of nephrolithiasis. Bilateral, renal calculi were noted on multiple CT scans over the years.      Hx of bladder calculus: He was noted to have a 3.5 cm stone within the bladder on CT scan done in 9/07. On a CT scan done 3/11 the stone appeared to be virtually unchanged measuring 3.6 x 3.6 cm. Surprisingly the patient is virtually asymptomatic from this with no irritative symptoms or gross hematuria. It has caused persistent pyuria however. By KUB in 9/12 it had increased in size to 5 x 5 cm. He finally underwent an open cystotomy and extraction of his bladder calculus on 04/22/12.      Bilateral renal cysts: These are all simple cysts and of no clinical significance.      BPH with bladder outlet obstruction: He has mild to moderate obstructive voiding symptoms with nocturia and some hesitancy especially at night but voids with a good stream throughout the day. He has no rectum and therefore his prostate is not palpable however he reported that his PSA was checked at the [place] in 2/11 and he indicates it runs about 1.    Interval history: He was recently seen in the hospital in consultation for low back pain and was found to have 2 large stones in his right ureter.   Past Medical History Problems  1. History of  Acute Myocardial Infarction V12.59 2. History of  Arthritis V13.4 3. History of  Bladder Calculus V13.01 4. History of  Chronic Renal Insufficiency 585.9 5. History of  Edema Of The Penis 607.83 6. History of  Hypercholesterolemia 272.0 7. History of  Hypertension 401.9 8. History of  Renal Insufficiency 593.9 9. History of  Scrotum Edema 608.86 10. History of  Skin Cancer V10.83  Surgical History Problems  1. History of  Bladder Cystotomy With Direct Removal Of Calculus 2. History of  Cataract Surgery 3. History of  Coronary Artery Triple Arterial Bypass Graft 4. History of  Ileostomy Revision  Complex  Current Meds 1. Aspirin 81 MG Oral Tablet; Therapy: (Recorded:14Mar2012) to 2. Atorvastatin Calcium TABS; Therapy: (Recorded:04Jun2013) to 3. Centrum Silver Oral Tablet; Therapy: (Recorded:14Sep2011) to 4. Fish Oil CAPS; Therapy: (Recorded:14Sep2011) to 5. Flaxseed Oil CAPS; Therapy: (Recorded:14Sep2011) to 6. Folic Acid TABS; Therapy: (Recorded:14Sep2011) to 7. Furosemide 40 MG Oral Tablet; Therapy: (Recorded:14Mar2012) to 8. ICaps CAPS; Therapy: (Recorded:14Sep2011) to 9. Klor-Con 20 MEQ Oral Packet; Therapy: (Recorded:14Mar2012) to 10. Metoprolol Tartrate 25 MG Oral Tablet; Therapy: (Recorded:14Mar2012) to 11. Tamsulosin HCl 0.4 MG Oral Capsule; Therapy: (Recorded:14Mar2012) to  Allergies Medication  1. No Known Drug Allergies  Family History Problems  1. Paternal history of  Acute Myocardial Infarction V17.3 2. Maternal history of  Colon Cancer V16.0  Social History Problems  1. Caffeine Use 2. Former Smoker V15.82 3 ppd 3. Marital History - Currently Married  Review of Systems Genitourinary, constitutional, skin, eye, otolaryngeal, hematologic/lymphatic, cardiovascular, pulmonary, endocrine, musculoskeletal, gastrointestinal, neurological and psychiatric system(s) were reviewed and pertinent findings if present are noted.    Vitals Vital Signs Blood Pressure: 122 / 84 Temperature: 97.8 F Heart Rate: 82  Physical Exam Constitutional: Well nourished and well developed . No acute distress.  ENT:. The ears and nose are normal in appearance.  Neck: The appearance of the neck is normal and no neck mass is present.  Pulmonary: No respiratory distress and normal respiratory rhythm and effort.  Cardiovascular:. No peripheral edema.  Abdomen:  The abdomen is not distended. The abdomen is soft and nontender. No CVA tenderness.  Skin: Normal skin turgor, no visible rash and no visible skin lesions.  Neuro/Psych:. Mood and affect are appropriate.      Assessment/Plan:  He has 2 large stones in his right ureter with some associated hydronephrosis. He has been having some low back pain which does not sound like renal colic. He describes a dull aching low back pain in the midline that is worsened by working out in the chart. This is almost certainly musculoskeletal in origin. His creatinine has changed very little from his baseline despite his right ureteral calculi and hydronephrosis on that side indicating that the small amount of renal parenchyma left on that side is not contributing a great deal to his overall renal function. That being said he does have renal insufficiency and maintaining as much functioning renal tissue as possible is important.  I discussed with the patient the options which would include ureteroscopy with laser lithotripsy versus extracorporeal shockwave lithotripsy. I gone over each of the procedure with him in detail including the pluses and minus, risks and complications and probability of success. I think in this situation, because of the large size of his stones that he would be best served by placement of a double-J stent followed by lithotripsy. He understands the possible need for a repeat procedure in order to treat both of the stones.  He will undergo cystoscopy with right Double-J stent placement followed by lithotripsy

## 2013-05-12 ENCOUNTER — Ambulatory Visit (HOSPITAL_COMMUNITY): Payer: Medicare Other

## 2013-05-12 ENCOUNTER — Encounter (HOSPITAL_COMMUNITY): Payer: Self-pay | Admitting: *Deleted

## 2013-05-12 ENCOUNTER — Encounter (HOSPITAL_COMMUNITY): Payer: Self-pay | Admitting: Anesthesiology

## 2013-05-12 ENCOUNTER — Ambulatory Visit (HOSPITAL_COMMUNITY): Payer: Medicare Other | Admitting: Anesthesiology

## 2013-05-12 ENCOUNTER — Encounter (HOSPITAL_COMMUNITY)
Admission: RE | Disposition: A | Discharge status: Home / Self Care | Payer: Self-pay | Source: Ambulatory Visit | Attending: Urology

## 2013-05-12 ENCOUNTER — Ambulatory Visit (HOSPITAL_COMMUNITY)
Admission: RE | Admit: 2013-05-12 | Discharge: 2013-05-12 | Disposition: A | Discharge status: Home / Self Care | Payer: Medicare Other | Source: Ambulatory Visit | Attending: Urology | Admitting: Urology

## 2013-05-12 DIAGNOSIS — N201 Calculus of ureter: Secondary | ICD-10-CM | POA: Insufficient documentation

## 2013-05-12 DIAGNOSIS — M129 Arthropathy, unspecified: Secondary | ICD-10-CM | POA: Insufficient documentation

## 2013-05-12 DIAGNOSIS — Z85828 Personal history of other malignant neoplasm of skin: Secondary | ICD-10-CM | POA: Insufficient documentation

## 2013-05-12 DIAGNOSIS — N138 Other obstructive and reflux uropathy: Secondary | ICD-10-CM | POA: Insufficient documentation

## 2013-05-12 DIAGNOSIS — N281 Cyst of kidney, acquired: Secondary | ICD-10-CM | POA: Insufficient documentation

## 2013-05-12 DIAGNOSIS — N320 Bladder-neck obstruction: Secondary | ICD-10-CM | POA: Insufficient documentation

## 2013-05-12 DIAGNOSIS — N401 Enlarged prostate with lower urinary tract symptoms: Secondary | ICD-10-CM | POA: Insufficient documentation

## 2013-05-12 DIAGNOSIS — E7800 Pure hypercholesterolemia, unspecified: Secondary | ICD-10-CM | POA: Insufficient documentation

## 2013-05-12 DIAGNOSIS — I252 Old myocardial infarction: Secondary | ICD-10-CM | POA: Insufficient documentation

## 2013-05-12 DIAGNOSIS — Z7982 Long term (current) use of aspirin: Secondary | ICD-10-CM | POA: Insufficient documentation

## 2013-05-12 DIAGNOSIS — Z79899 Other long term (current) drug therapy: Secondary | ICD-10-CM | POA: Insufficient documentation

## 2013-05-12 DIAGNOSIS — Z87442 Personal history of urinary calculi: Secondary | ICD-10-CM | POA: Insufficient documentation

## 2013-05-12 DIAGNOSIS — N210 Calculus in bladder: Secondary | ICD-10-CM | POA: Insufficient documentation

## 2013-05-12 DIAGNOSIS — N189 Chronic kidney disease, unspecified: Secondary | ICD-10-CM | POA: Insufficient documentation

## 2013-05-12 DIAGNOSIS — Z87891 Personal history of nicotine dependence: Secondary | ICD-10-CM | POA: Insufficient documentation

## 2013-05-12 DIAGNOSIS — I129 Hypertensive chronic kidney disease with stage 1 through stage 4 chronic kidney disease, or unspecified chronic kidney disease: Secondary | ICD-10-CM | POA: Insufficient documentation

## 2013-05-12 HISTORY — DX: Peripheral vascular disease, unspecified: I73.9

## 2013-05-12 HISTORY — DX: Pneumonia, unspecified organism: J18.9

## 2013-05-12 HISTORY — PX: CYSTOSCOPY W/ URETERAL STENT PLACEMENT: SHX1429

## 2013-05-12 LAB — PROTIME-INR
INR: 1.35 (ref 0.00–1.49)
Prothrombin Time: 16.4 seconds — ABNORMAL HIGH (ref 11.6–15.2)

## 2013-05-12 SURGERY — CYSTOSCOPY, WITH RETROGRADE PYELOGRAM AND URETERAL STENT INSERTION
Anesthesia: General | Laterality: Right | Wound class: Clean Contaminated

## 2013-05-12 SURGERY — LITHOTRIPSY, ESWL
Anesthesia: LOCAL | Laterality: Right

## 2013-05-12 MED ORDER — PHENAZOPYRIDINE HCL 200 MG PO TABS: 200.0000 mg | ORAL_TABLET | Freq: Three times a day (TID) | ORAL | Status: DC | PRN

## 2013-05-12 MED ORDER — HYDROCODONE-ACETAMINOPHEN 10-325 MG PO TABS: 1.0000 | ORAL_TABLET | Freq: Four times a day (QID) | ORAL | Status: DC | PRN

## 2013-05-12 MED ORDER — CIPROFLOXACIN IN D5W 200 MG/100ML IV SOLN
200.0000 mg | INTRAVENOUS | Status: AC
  Administered 2013-05-12: 200 mg via INTRAVENOUS
  Filled 2013-05-12: qty 100

## 2013-05-12 MED ORDER — FENTANYL CITRATE 0.05 MG/ML IJ SOLN
INTRAMUSCULAR | Status: DC | PRN
  Administered 2013-05-12 (×2): 25 ug via INTRAVENOUS

## 2013-05-12 MED ORDER — FENTANYL CITRATE 0.05 MG/ML IJ SOLN: 25.0000 ug | INTRAMUSCULAR | Status: DC | PRN

## 2013-05-12 MED ORDER — SODIUM CHLORIDE 0.9 % IR SOLN
Status: DC | PRN
  Administered 2013-05-12: 3000 mL

## 2013-05-12 MED ORDER — ONDANSETRON HCL 4 MG/2ML IJ SOLN
INTRAMUSCULAR | Status: DC | PRN
  Administered 2013-05-12: 4 mg via INTRAVENOUS

## 2013-05-12 MED ORDER — DIAZEPAM 5 MG PO TABS: 10.0000 mg | ORAL_TABLET | ORAL | Status: DC

## 2013-05-12 MED ORDER — PROPOFOL 10 MG/ML IV BOLUS
INTRAVENOUS | Status: DC | PRN
  Administered 2013-05-12: 90 mg via INTRAVENOUS

## 2013-05-12 MED ORDER — EPHEDRINE SULFATE 50 MG/ML IJ SOLN
INTRAMUSCULAR | Status: DC | PRN
  Administered 2013-05-12: 5 mg via INTRAVENOUS

## 2013-05-12 MED ORDER — LACTATED RINGERS IV SOLN
INTRAVENOUS | Status: DC | PRN
  Administered 2013-05-12: 09:00:00 via INTRAVENOUS

## 2013-05-12 MED ORDER — IOHEXOL 300 MG/ML  SOLN
INTRAMUSCULAR | Status: AC
  Filled 2013-05-12: qty 1

## 2013-05-12 MED ORDER — LACTATED RINGERS IV SOLN: INTRAVENOUS | Status: DC

## 2013-05-12 MED ORDER — 0.9 % SODIUM CHLORIDE (POUR BTL) OPTIME
TOPICAL | Status: DC | PRN
  Administered 2013-05-12: 1000 mL

## 2013-05-12 MED ORDER — LIDOCAINE HCL (CARDIAC) 20 MG/ML IV SOLN
INTRAVENOUS | Status: DC | PRN
  Administered 2013-05-12: 50 mg via INTRAVENOUS

## 2013-05-12 MED ORDER — CIPROFLOXACIN IN D5W 400 MG/200ML IV SOLN: 400.0000 mg | INTRAVENOUS | Status: DC

## 2013-05-12 MED ORDER — DIPHENHYDRAMINE HCL 25 MG PO CAPS: 25.0000 mg | ORAL_CAPSULE | ORAL | Status: DC

## 2013-05-12 MED ORDER — IOHEXOL 300 MG/ML  SOLN
INTRAMUSCULAR | Status: DC | PRN
  Administered 2013-05-12: 10 mL via INTRAVENOUS

## 2013-05-12 SURGICAL SUPPLY — 14 items
ADAPTER CATH URET PLST 4-6FR (CATHETERS) ×2 IMPLANT
BAG URO CATCHER STRL LF (DRAPE) ×2 IMPLANT
BASKET LASER NITINOL 1.9FR (BASKET) ×2 IMPLANT
CATH INTERMIT  6FR 70CM (CATHETERS) ×2 IMPLANT
CLOTH BEACON ORANGE TIMEOUT ST (SAFETY) ×2 IMPLANT
DRAPE CAMERA CLOSED 9X96 (DRAPES) ×2 IMPLANT
GLOVE BIOGEL M 8.0 STRL (GLOVE) ×2 IMPLANT
GOWN PREVENTION PLUS XLARGE (GOWN DISPOSABLE) ×2 IMPLANT
GOWN STRL REIN XL XLG (GOWN DISPOSABLE) ×2 IMPLANT
GUIDEWIRE STR DUAL SENSOR (WIRE) ×2 IMPLANT
MANIFOLD NEPTUNE II (INSTRUMENTS) ×2 IMPLANT
PACK CYSTO (CUSTOM PROCEDURE TRAY) ×2 IMPLANT
STENT CONTOUR 6FRX24X.038 (STENTS) ×2 IMPLANT
TUBING CONNECTING 10 (TUBING) ×2 IMPLANT

## 2013-05-12 NOTE — Op Note (Signed)
PATIENT:  [name] [name]  PRE-OPERATIVE DIAGNOSIS: 1. Right ureteral calculi.  POST-OPERATIVE DIAGNOSIS: 1. Right ureteral calculi 2. Bladder calculi  PROCEDURE: 1. Cystoscopy with right retrograde pyelogram including interpretation. 2. Right double-J stent placement 3. Bladder stone extraction (1.5 cm)  SURGEON:  Garnett Farm  INDICATION: [name] [name] is a 79 year old male with a history of nephrolithiasis and bladder calculi. He was recently seen in the hospital for right ureteral calculi. There were 2 stones in the right ureter and we discussed the treatment options. He elected to proceed with lithotripsy and we discussed the need for stent placement prior to that.  ANESTHESIA:  General  EBL:  Minimal  DRAINS: 6 French, 24 cm double-J stent in the right ureter (no string)  LOCAL MEDICATIONS USED:  None  SPECIMEN:  Bladder calculi were given to patient.  Description of procedure: After informed consent the patient was brought to the major or, placed on the table and administered general anesthesia. He was then moved to the dorsal lithotomy position and his genitalia sterilely prepped and draped. He received preoperative antibiotics. An official timeout was then performed.  Cystoscopy was performed initially with a 22 French rigid cystoscope and 12 lens. Ureter was noted be normal down to the sphincter which appeared intact. The prostatic urethra revealed some elongation with trilobar hypertrophy. No prostatic lesions were identified. The bladder was then entered and noted to have 3+ trabeculation. Both ureteral orifices were of normal configuration and position. The bladder stones were identified on the floor of the bladder and photographed. No lesions or tumors were identified within the bladder.  Right retrograde pyelography: Through the cystoscope a 6 French open-ended ureteral catheter was then passed and the right ureteral orifice was intubated. Full-strength contrast was then  injected under direct fluoroscopic control and this revealed what appeared to be of normal distal ureter. At about the level of the mid sacrum there appeared to be a filling defect with a change in caliber of the ureter. In fact it appeared to be 2 separate filling defects within further contrast was injected. The remainder of the ureter appeared normal without significant dilatation in the intrarenal collecting system revealed mild dilatation but no intrarenal collecting system abnormalities were identified.  A 0.038 inch floppy-tipped guidewire was then passed through the open ended catheter and into the area of the right renal pelvis under fluoroscopic control. This was left in place and the open-ended catheter removed. A double-J stent was then passed over the guidewire into the area the renal pelvis. As the guidewire was removed good curl was noted in the renal pelvis and in the bladder.  I passed a Nitinol basket through the cystoscope and engaged each of the bladder calculi separately and extracted those without difficulty. I then drained the bladder, removed the cystoscope and the patient was awakened and taken to the recovery room in stable and satisfactory condition. He tolerated these well no intraoperative complications. He'll undergo KUB in preparation for lithotripsy later today.   PLAN OF CARE: Discharge to home after PACU  PATIENT DISPOSITION:  PACU - hemodynamically stable.

## 2013-05-12 NOTE — Op Note (Signed)
See [facility] OP note scanned into chart. 

## 2013-05-12 NOTE — Interval H&P Note (Signed)
History and Physical Interval Note:  05/12/2013 8:28 AM  [name] [name]  has presented today for surgery, with the diagnosis of RIGHT URTERAL CALCULUS   The various methods of treatment have been discussed with the patient and family. After consideration of risks, benefits and other options for treatment, the patient has consented to  Procedure(s): CYSTOSCOPY WITH RIGHT RETROGRADE PYELOGRAM/ RIGHT URETERAL STENT PLACEMENT (Right) as a surgical intervention .  The patient's history has been reviewed, patient examined, no change in status, stable for surgery.  I have reviewed the patient's chart and labs.  Questions were answered to the patient's satisfaction.     Garnett Farm

## 2013-05-12 NOTE — Progress Notes (Signed)
KUB done in PACU

## 2013-05-12 NOTE — Interval H&P Note (Signed)
History and Physical Interval Note:  05/12/2013 11:47 AM  [name] [name]  has presented today for surgery, with the diagnosis of RIGHT URTERAL CALCULUS   The various methods of treatment have been discussed with the patient and family. After consideration of risks, benefits and other options for treatment, the patient has consented to  Procedure(s): RIGHT EXTRACORPOREAL SHOCK WAVE LITHOTRIPSY (ESWL) (Right) as a surgical intervention .  The patient's history has been reviewed, patient examined, no change in status, stable for surgery.  I have reviewed the patient's chart and labs.  Questions were answered to the patient's satisfaction.     Garnett Farm

## 2013-05-12 NOTE — Transfer of Care (Signed)
Immediate Anesthesia Transfer of Care Note  Patient: [name] [name]  Procedure(s) Performed: Procedure(s) (LRB): CYSTOSCOPY WITH RIGHT RETROGRADE PYELOGRAM/ RIGHT URETERAL STENT PLACEMENT (Right)  Patient Location: PACU  Anesthesia Type: General  Level of Consciousness: sedated, patient cooperative and responds to stimulaton  Airway & Oxygen Therapy: Patient Spontanous Breathing and Patient connected to face mask oxgen  Post-op Assessment: Report given to PACU RN and Post -op Vital signs reviewed and stable  Post vital signs: Reviewed and stable  Complications: No apparent anesthesia complications

## 2013-05-12 NOTE — Anesthesia Preprocedure Evaluation (Addendum)
Anesthesia Evaluation  Patient identified by MRN, date of birth, ID band Patient awake  General Assessment Comment:1. History of  Acute Myocardial Infarction V12.59 2. History of  Arthritis V13.4 3. History of  Bladder Calculus V13.01 4. History of  Chronic Renal Insufficiency 585.9 5. History of  Edema Of The Penis 607.83 6. History of  Hypercholesterolemia 272.0 7. History of  Hypertension 401.9 8. History of  Renal Insufficiency 593.9 9. History of  Scrotum Edema 608.86   Reviewed: Allergy & Precautions, H&P , NPO status , Patient's Chart, lab work & pertinent test results  Airway Mallampati: II TM Distance: >3 FB Neck ROM: Full    Dental  (+) Poor Dentition, Loose and Dental Advisory Given States lower teeth are loose.:   Pulmonary pneumonia -, resolved,  Denies SOB. breath sounds clear to auscultation  Pulmonary exam normal       Cardiovascular Exercise Tolerance: Poor hypertension, Pt. on medications and Pt. on home beta blockers + CAD, + Past MI, + Peripheral Vascular Disease and +CHF + dysrhythmias Atrial Fibrillation Rhythm:Regular Rate:Normal  EF 45-50% on recent echo 5-14   Neuro/Psych Recent syncope 05-01-13 with IV iron infusion. negative psych ROS   GI/Hepatic negative GI ROS, Neg liver ROS, PUD,   Endo/Other  negative endocrine ROS  Renal/GU Renal InsufficiencyRenal diseaseCr 2.13  negative genitourinary   Musculoskeletal negative musculoskeletal ROS (+)   Abdominal   Peds negative pediatric ROS (+)  Hematology negative hematology ROS (+)   Anesthesia Other Findings   Reproductive/Obstetrics negative OB ROS                         Anesthesia Physical Anesthesia Plan  ASA: III  Anesthesia Plan: General   Post-op Pain Management:    Induction: Intravenous  Airway Management Planned: LMA  Additional Equipment:   Intra-op Plan:   Post-operative Plan: Extubation in  OR  Informed Consent: I have reviewed the patients History and Physical, chart, labs and discussed the procedure including the risks, benefits and alternatives for the proposed anesthesia with the patient or authorized representative who has indicated his/her understanding and acceptance.   Dental advisory given  Plan Discussed with: CRNA  Anesthesia Plan Comments:         Anesthesia Quick Evaluation

## 2013-05-12 NOTE — Anesthesia Postprocedure Evaluation (Signed)
  Anesthesia Post-op Note  Patient: [name] [name]  Procedure(s) Performed: Procedure(s) (LRB): CYSTOSCOPY WITH RIGHT RETROGRADE PYELOGRAM/ RIGHT URETERAL STENT PLACEMENT (Right)  Patient Location: PACU  Anesthesia Type: General  Level of Consciousness: awake and alert   Airway and Oxygen Therapy: Patient Spontanous Breathing  Post-op Pain: mild  Post-op Assessment: Post-op Vital signs reviewed, Patient's Cardiovascular Status Stable, Respiratory Function Stable, Patent Airway and No signs of Nausea or vomiting  Last Vitals:  Filed Vitals:   05/12/13 0945  BP: 130/76  Pulse: 62  Temp: 36.5 C  Resp: 12    Post-op Vital Signs: stable   Complications: No apparent anesthesia complications

## 2013-05-13 ENCOUNTER — Encounter (HOSPITAL_COMMUNITY): Payer: Self-pay | Admitting: Urology

## 2013-05-20 ENCOUNTER — Ambulatory Visit (INDEPENDENT_AMBULATORY_CARE_PROVIDER_SITE_OTHER): Payer: Medicare Other | Admitting: Family Medicine

## 2013-05-20 ENCOUNTER — Encounter: Payer: Self-pay | Admitting: Cardiology

## 2013-05-20 ENCOUNTER — Encounter: Payer: Self-pay | Admitting: Family Medicine

## 2013-05-20 VITALS — BP 110/64 | HR 62 | Temp 98.3°F | Wt 168.0 lb

## 2013-05-20 DIAGNOSIS — R221 Localized swelling, mass and lump, neck: Secondary | ICD-10-CM

## 2013-05-20 DIAGNOSIS — D509 Iron deficiency anemia, unspecified: Secondary | ICD-10-CM

## 2013-05-20 LAB — CBC WITH DIFFERENTIAL/PLATELET
Basophils Absolute: 0 10*3/uL (ref 0.0–0.1)
Basophils Relative: 0 % (ref 0–1)
Hemoglobin: 10.6 g/dL — ABNORMAL LOW (ref 13.0–17.0)
Lymphocytes Relative: 9 % — ABNORMAL LOW (ref 12–46)
MCHC: 31.7 g/dL (ref 30.0–36.0)
Monocytes Relative: 7 % (ref 3–12)
Neutro Abs: 10.2 10*3/uL — ABNORMAL HIGH (ref 1.7–7.7)
Neutrophils Relative %: 83 % — ABNORMAL HIGH (ref 43–77)
WBC: 12.4 10*3/uL — ABNORMAL HIGH (ref 4.0–10.5)

## 2013-05-20 NOTE — Progress Notes (Signed)
Subjective:    Patient ID: [name] [name], male    DOB: 10-19-1930, 79 y.o.   MRN: [id number]  HPI Noticed a knot on the right side of his neck.  NO pain or tenderness or rash. Had a IV infusion of iron aobut 2 weeks ago and during the infusion had severe back pain. They told him he passed out while there.  Thye decided to admit him overnight and he was given a blood trausion.   Has had some chills this week.  Woke and felt dizzy this AM when urinated aand this hasn't happened before. BP 147/78.  Later BP was 114/68. Still feels a little unsteady.  Was dx with kidney stones and had them "blasted". Had a stent placed as well.   Put on an antibiotic as well.  Has f/u tomorrow with Dr. Warner Mccreedy.  He is temporarily off his Coumadin and has not restarted it yet. No chest pain or shortness of breath. No lower extremity swelling. No other masses. No dysphagia or ST or ear pain. No prior history of cancer. He has recently cut back on his diuretics and wondered if the mass could be fluid accumulation.  Review of Systems BP 110/64  Pulse 62  Temp(Src) 98.3 F (36.8 C) (Oral)  Wt 168 lb (76.204 kg)  BMI 27.96 kg/m2    No Known Allergies  Past Medical History  Diagnosis Date  . Coronary artery disease   . MI, old     AGE 56  . Ulcerative colitis   . Hyperlipidemia   . Hypertension   . History of nephrolithiasis   . PAF (paroxysmal atrial fibrillation)   . LV dysfunction   . Myocardial infarction   . Anemia   . Blood transfusion   . Arthritis   . CHF (congestive heart failure)   . Mitral regurgitation   . Pneumonia     years ago  . Peripheral vascular disease   . Ileostomy in place 05-08-13    right abdomen  . Acute renal failure     "related ? Kidney stone blockage"  . Gout flare     right index finger-tx. prednisone, allopurinol  . Swelling 05-08-13    rt. base of neck-something recent.    Past Surgical History  Procedure Laterality Date  . Cardiac catheterization  10/14/2010    EF  30%  . Coronary artery bypass graft  10/24/2010    LIMA TO THE LAD, A SAPHENOUS VEIN GRAFT TO THE DIAGONAL 1 AND 3, AND SAPHENOUS VEIN GRAFT TO THE DISTAL RIGHT CORONARY ARTERY  . Ileostomy    . Colectomy    . Cataract extraction    . Transthoracic echocardiogram  01/16/2011    EF 50%  . Cardiovascular stress test  10/07/2010    EF 33%  . Appendectomy    . Cystostomy  04/22/2012    Procedure: CYSTOSTOMY SUPRAPUBIC;  Surgeon: Garnett Farm, MD;  Location: [facility]S;  Service: Urology;  Laterality: N/A;  CYSTOTOMY WITH MIDLINE INCISION REMOVAL OF STONE  . Tee without cardioversion  08/28/2012    Procedure: TRANSESOPHAGEAL ECHOCARDIOGRAM (TEE);  Surgeon: [name], MD;  Location: [facility] ENDOSCOPY;  Service: Cardiovascular;  Laterality: N/A;  . Cardioversion  08/28/2012    Procedure: CARDIOVERSION;  Surgeon: [name], MD;  Location: [facility] ENDOSCOPY;  Service: Cardiovascular;  Laterality: N/A;  . Cystoscopy w/ ureteral stent placement Right 05/12/2013    Procedure: CYSTOSCOPY WITH RIGHT RETROGRADE PYELOGRAM/ RIGHT URETERAL STENT PLACEMENT;  Surgeon: Garnett Farm,  MD;  Location: [facility];  Service: Urology;  Laterality: Right;    History   Social History  . Marital Status: Married    Spouse Name: N/A    Number of Children: N/A  . Years of Education: N/A   Occupational History  . Not on file.   Social History Main Topics  . Smoking status: Former Smoker -- 1.00 packs/day for 15 years    Types: Cigarettes, Cigars    Quit date: 12/25/1968  . Smokeless tobacco: Not on file  . Alcohol Use: No     Comment: occasional glass of wine  . Drug Use: No  . Sexually Active: Not Currently   Other Topics Concern  . Not on file   Social History Narrative  . No narrative on file    Family History  Problem Relation Age of Onset  . Colon cancer Mother     colon  . Heart attack Father   . Hypertension Father   . Heart disease Sister   . Hypertension Sister   . Heart disease Brother   .  Hypertension Brother   . Heart disease Sister   . Hypertension Sister   . Heart disease Sister   . Hypertension Sister   . Heart disease Brother   . Hypertension Brother   . Heart disease Brother   . Hypertension Brother   . Heart disease Brother   . Hypertension Brother   . Heart disease Brother   . Hypertension Brother   . Heart disease Brother   . Hypertension Brother   . Heart disease Brother   . Hypertension Brother   . Heart disease Brother   . Hypertension Brother   . Stroke Brother     Outpatient Encounter Prescriptions as of 05/20/2013  Medication Sig Dispense Refill  . acetaminophen (TYLENOL) 650 MG CR tablet Take 650 mg by mouth 2 (two) times daily as needed for pain.       [name] allopurinol (ZYLOPRIM) 100 MG tablet Take 100 mg by mouth every morning.      [name] amiodarone (PACERONE) 200 MG tablet Take 100 mg by mouth every evening.      . carvedilol (COREG) 25 MG tablet Take 25 mg by mouth 2 (two) times daily with a meal.      . cholecalciferol (VITAMIN D) 1000 UNITS tablet Take 1,000 Units by mouth daily.      . ferrous sulfate 325 (65 FE) MG tablet Take 325 mg by mouth every evening.      . fish oil-omega-3 fatty acids 1000 MG capsule Take 1 g by mouth 2 (two) times daily.       [name] FLAXSEED, LINSEED, PO Take 1 tablet by mouth at bedtime.       . folic acid (FOLVITE) 400 MCG tablet Take 400 mcg by mouth daily.       . furosemide (LASIX) 40 MG tablet Take 20-40 mg by mouth 3 (three) times daily. 1 tab  In the am, 1/2 tab at lunch, and 1 tab at bedtime      . hydrALAZINE (APRESOLINE) 25 MG tablet Take 25 mg by mouth 3 (three) times daily.      . isosorbide mononitrate (IMDUR) 60 MG 24 hr tablet Take 60 mg by mouth daily.      . Multiple Vitamin (MULITIVITAMIN WITH MINERALS) TABS Take 1 tablet by mouth daily.      . Multiple Vitamins-Minerals (ICAPS AREDS FORMULA PO) Take 1 capsule by mouth every evening.      [name]  nitroGLYCERIN (NITROSTAT) 0.4 MG SL tablet Place 0.4 mg under the  tongue every 5 (five) minutes as needed for chest pain.       [name] omega-3 acid ethyl esters (LOVAZA) 1 G capsule Take 1 capsule (1 g total) by mouth daily.  30 capsule  0  . potassium chloride SA (K-DUR,KLOR-CON) 20 MEQ tablet Take 1 tablet (20 mEq total) by mouth daily.  90 tablet  3  . Tamsulosin HCl (FLOMAX) 0.4 MG CAPS Take 0.4 mg by mouth every evening.      . warfarin (COUMADIN) 5 MG tablet Take 2.5-5 mg by mouth at bedtime. Take 5mg  on Wednesday and 2.5mg  on all other days      . [DISCONTINUED] HYDROcodone-acetaminophen (NORCO) 10-325 MG per tablet Take 1 tablet by mouth every 6 (six) hours as needed for pain.  30 tablet  0  . [DISCONTINUED] HYDROcodone-acetaminophen (NORCO/VICODIN) 5-325 MG per tablet Take 1 tablet by mouth every 6 (six) hours as needed for pain.  6 tablet  0  . [DISCONTINUED] phenazopyridine (PYRIDIUM) 200 MG tablet Take 1 tablet (200 mg total) by mouth 3 (three) times daily as needed for pain.  30 tablet  0  . [DISCONTINUED] predniSONE (DELTASONE) 10 MG tablet Take 2 tablets (20 mg total) by mouth 2 (two) times daily.  15 tablet  0   No facility-administered encounter medications on file as of 05/20/2013.          Objective:   Physical Exam  Constitutional: He is oriented to person, place, and time. He appears well-developed and well-nourished.  HENT:  Head: Normocephalic and atraumatic.  Right Ear: External ear normal.  Left Ear: External ear normal.  Nose: Nose normal.  Mouth/Throat: Oropharynx is clear and moist.  TMs and canals are clear. The right side of his lower jaw looks a little bit swollen. No erythema or rash. No masses.  Eyes: Conjunctivae and EOM are normal. Pupils are equal, round, and reactive to light.  Neck: Neck supple. No tracheal deviation present. No thyromegaly present.  He has a very large approximately 5-6 cm firm mass on the right side of the neck. It does not feel to be attached to the thyroid gland. The thyroid gland appears normal. It  is nontender. I'm unable to palpate or hear any pulse in the lesion. No other swollen cervical lymph nodes.  Cardiovascular: Normal rate and normal heart sounds.   Pulmonary/Chest: Effort normal and breath sounds normal. No stridor.  Lymphadenopathy:    He has no cervical adenopathy.  Neurological: He is alert and oriented to person, place, and time.  Skin: Skin is warm and dry.  Psychiatric: He has a normal mood and affect. His behavior is normal.          Assessment & Plan:  Neck Mass - will order CT of neck.  I am very concerned because the mass feels solid. It does not feel fluctuant like fluid. It is not pulsatile and I am unable to hear any type of pulse. Fortunately he has not had any other constitutional symptoms such as fever etc. He is otherwise actually felt fairly well.

## 2013-05-21 ENCOUNTER — Encounter (HOSPITAL_COMMUNITY): Payer: Self-pay

## 2013-05-21 ENCOUNTER — Ambulatory Visit (HOSPITAL_COMMUNITY)
Admission: RE | Admit: 2013-05-21 | Discharge: 2013-05-21 | Disposition: A | Discharge status: Home / Self Care | Payer: Medicare Other | Source: Ambulatory Visit | Attending: Family Medicine | Admitting: Family Medicine

## 2013-05-21 ENCOUNTER — Other Ambulatory Visit: Payer: Self-pay | Admitting: Family Medicine

## 2013-05-21 DIAGNOSIS — M47812 Spondylosis without myelopathy or radiculopathy, cervical region: Secondary | ICD-10-CM | POA: Insufficient documentation

## 2013-05-21 DIAGNOSIS — E041 Nontoxic single thyroid nodule: Secondary | ICD-10-CM | POA: Insufficient documentation

## 2013-05-21 DIAGNOSIS — R220 Localized swelling, mass and lump, head: Secondary | ICD-10-CM | POA: Insufficient documentation

## 2013-05-21 DIAGNOSIS — R221 Localized swelling, mass and lump, neck: Secondary | ICD-10-CM

## 2013-05-21 DIAGNOSIS — M4310 Spondylolisthesis, site unspecified: Secondary | ICD-10-CM | POA: Insufficient documentation

## 2013-05-21 DIAGNOSIS — N189 Chronic kidney disease, unspecified: Secondary | ICD-10-CM | POA: Insufficient documentation

## 2013-05-21 DIAGNOSIS — I6529 Occlusion and stenosis of unspecified carotid artery: Secondary | ICD-10-CM | POA: Insufficient documentation

## 2013-05-21 LAB — COMPLETE METABOLIC PANEL WITH GFR
AST: 26 U/L (ref 0–37)
Albumin: 2.9 g/dL — ABNORMAL LOW (ref 3.5–5.2)
Alkaline Phosphatase: 83 U/L (ref 39–117)
Chloride: 106 mEq/L (ref 96–112)
Glucose, Bld: 87 mg/dL (ref 70–99)
Potassium: 4.3 mEq/L (ref 3.5–5.3)
Sodium: 138 mEq/L (ref 135–145)
Total Protein: 6.2 g/dL (ref 6.0–8.3)

## 2013-05-21 MED ORDER — IOHEXOL 300 MG/ML  SOLN: 50.0000 mL | Freq: Once | INTRAMUSCULAR | Status: AC | PRN

## 2013-05-22 ENCOUNTER — Other Ambulatory Visit: Payer: Self-pay | Admitting: Family Medicine

## 2013-05-22 DIAGNOSIS — D492 Neoplasm of unspecified behavior of bone, soft tissue, and skin: Secondary | ICD-10-CM

## 2013-05-22 DIAGNOSIS — E041 Nontoxic single thyroid nodule: Secondary | ICD-10-CM

## 2013-05-22 MED ORDER — CEFAZOLIN SODIUM-DEXTROSE 2-3 GM-% IV SOLR
INTRAVENOUS | Status: AC
  Filled 2013-05-22: qty 50

## 2013-05-23 ENCOUNTER — Other Ambulatory Visit: Payer: Self-pay | Admitting: Family Medicine

## 2013-05-23 ENCOUNTER — Telehealth: Payer: Self-pay | Admitting: Hematology & Oncology

## 2013-05-23 MED ORDER — COLCRYS 0.6 MG PO TABS: 0.6000 mg | ORAL_TABLET | Freq: Every day | ORAL | Status: DC

## 2013-05-23 MED ORDER — COLCHICINE 0.6 MG PO TABS: 0.6000 mg | ORAL_TABLET | Freq: Every day | ORAL | Status: DC

## 2013-05-23 NOTE — Telephone Encounter (Signed)
MD has chart for review

## 2013-05-26 ENCOUNTER — Telehealth: Payer: Self-pay | Admitting: Hematology & Oncology

## 2013-05-26 NOTE — Telephone Encounter (Signed)
Pt aware of 6-3 appointment °

## 2013-05-27 ENCOUNTER — Telehealth: Payer: Self-pay | Admitting: Hematology & Oncology

## 2013-05-27 ENCOUNTER — Other Ambulatory Visit: Payer: Self-pay | Admitting: *Deleted

## 2013-05-27 ENCOUNTER — Ambulatory Visit (HOSPITAL_BASED_OUTPATIENT_CLINIC_OR_DEPARTMENT_OTHER): Payer: Medicare Other | Admitting: Hematology & Oncology

## 2013-05-27 ENCOUNTER — Ambulatory Visit (INDEPENDENT_AMBULATORY_CARE_PROVIDER_SITE_OTHER): Payer: Medicare Other

## 2013-05-27 ENCOUNTER — Ambulatory Visit: Payer: Medicare Other

## 2013-05-27 ENCOUNTER — Other Ambulatory Visit: Payer: Medicare Other | Admitting: Lab

## 2013-05-27 VITALS — BP 144/61 | HR 61 | Temp 98.2°F | Resp 18 | Ht 62.0 in | Wt 161.0 lb

## 2013-05-27 DIAGNOSIS — R221 Localized swelling, mass and lump, neck: Secondary | ICD-10-CM

## 2013-05-27 DIAGNOSIS — R222 Localized swelling, mass and lump, trunk: Secondary | ICD-10-CM

## 2013-05-27 DIAGNOSIS — N289 Disorder of kidney and ureter, unspecified: Secondary | ICD-10-CM

## 2013-05-27 DIAGNOSIS — D492 Neoplasm of unspecified behavior of bone, soft tissue, and skin: Secondary | ICD-10-CM

## 2013-05-27 DIAGNOSIS — R9389 Abnormal findings on diagnostic imaging of other specified body structures: Secondary | ICD-10-CM

## 2013-05-27 NOTE — Telephone Encounter (Signed)
Central Scheduling is aware of biopsy and per Clarise Cruz is working on it.

## 2013-05-27 NOTE — Addendum Note (Signed)
Addended by: Deno Etienne on: 05/27/2013 09:26 AM   Modules accepted: Orders

## 2013-05-27 NOTE — Progress Notes (Signed)
This office note has been dictated.

## 2013-05-28 ENCOUNTER — Telehealth: Payer: Self-pay | Admitting: *Deleted

## 2013-05-28 ENCOUNTER — Ambulatory Visit (HOSPITAL_COMMUNITY)
Admission: RE | Admit: 2013-05-28 | Discharge: 2013-05-28 | Disposition: A | Discharge status: Home / Self Care | Payer: Medicare Other | Source: Ambulatory Visit | Attending: Family Medicine | Admitting: Family Medicine

## 2013-05-28 ENCOUNTER — Other Ambulatory Visit: Payer: Self-pay | Admitting: Radiology

## 2013-05-28 DIAGNOSIS — E041 Nontoxic single thyroid nodule: Secondary | ICD-10-CM | POA: Insufficient documentation

## 2013-05-28 DIAGNOSIS — R221 Localized swelling, mass and lump, neck: Secondary | ICD-10-CM

## 2013-05-28 NOTE — Progress Notes (Signed)
CC:   [name], M.D. Cecille Aver, M.D. Veverly Fells. [name], M.D.  DIAGNOSIS:  Right supraclavicular mass.  HISTORY OF PRESENT ILLNESS:  [name] is a very nice 79 year old white gentleman.  He is another one of the "true American heroes."  He fought in the [place] War.  Again, he is part of the greatest generation.  He actually grew up in an orphanage.  He has 8 brothers and 3 sisters. His mother died when she was young of colon cancer.  His father died of a heart attack when he was quite young.  There is history of cancer in the family.  He is not sure what type his siblings have had.  He is followed by Dr. Linford Arnold.  He noted over the past month or so increasing swelling in the right neck.  This was not painful.  There is no pain in his right arm.  There is no weakness in his right arm.  He is not a smoker.  He stopped smoking back in 1970.  He does not drink.  There has been no dysphasia or odynophagia.  He has had no hoarseness. He has had no pain in his ears.  Dr. Linford Arnold went ahead and got a CT scan of his neck.  This was done on 05/21/2013.  This showed a 5.5 x 5 x 6.6 cm right supraclavicular mass. It was displacing the sternocleidomastoid muscle.  It also was compressing the right internal jugular vein.  The carotid artery was okay.  There was no significant lymphadenopathy.  There was a lower lymph node just posterior to the right lobe of the thyroid measuring 2.3 x 2 x 2.4 cm.  Also noted was a low-density lesion in the right thyroid lobe measuring 2.3 x 1.3 x 2 cm.  He does have a high right paratracheal lymph node measuring 2.2 x 2.2 cm.  Of note, he recently had kidney stones dealt with.  I think he said he had a stent placed.  Dr. [name] did this of Urology.  Mr. Almquist does have a colostomy.  This was done about 20 years or so ago.  It was not for "colon cancer."  It sounds like he may have had diverticulitis.  He has not lost weight.   He has had no fever.  He has had no sweats.  He has not noticed any rashes.  There has been no change in bowel or bladder habits.  Again, he has a colostomy which he says has been working well.  There has been no chest wall pain.  He has had no increase in fatigue. He has had no hemoptysis.  Overall, his performance status is ECOG 1.  PAST MEDICAL HISTORY: 1. Coronary artery disease, status post myocardial infarction.  He has     had a CABG. 2. Ulcerative colitis. 3. Hyperlipidemia. 4. Hypertension. 5. Paroxysmal atrial fibrillation.  ALLERGIES:  None.  MEDICATIONS:  Allopurinol 100 mg p.o. daily, amiodarone 100 mg p.o. daily, Coreg 25 mg p.o. b.i.d., colchicine 0.6 mg p.o. daily, iron sulfate 325 mg p.o. daily, Lasix 40 mg p.o. t.i.d., hydralazine 25 mg p.o. t.i.d., Imdur 60 mg p.o. daily, potassium 20 mEq p.o. daily, Flomax 0.4 mg p.o. daily, Coumadin 5 mg on Wednesday and 2.5 mg the other days of the week.  SOCIAL HISTORY:  Remarkable for past tobacco use.  He stopped back in 1970.  He probably has about a 20-30 pack year history of tobacco use. There is no significant alcohol use.  FAMILY HISTORY:  Remarkable for coronary artery disease and malignancy.  REVIEW OF SYSTEMS:  As stated in the history of present illness.  No additional findings noted on a 12-system review.  PHYSICAL EXAMINATION:  General:  This is a well-developed, well- nourished, elderly gentleman in no obvious distress.  Vital signs: Temperature of 98.2, pulse 61, respiratory rate 18, blood pressure 144/61.  Weight is 161.  Head and neck:  Normocephalic, atraumatic skull.  He does have a large firm, somewhat non-mobile right supraclavicular mass.  This is nontender to touch.  It is quite firm. No other lymphadenopathy is noted in the neck.  There are no intraoral lesions.  He has good extraocular muscle movement.  Pupils react appropriately.  Lungs:  Clear bilaterally.  There are no rales, wheezes, or  rhonchi.  Cardiac:  Regular rate and rhythm with a normal S1 and S2. He has a 1/6 systolic ejection murmur.  Axillary:  No bilateral axillary adenopathy.  Abdomen:  Soft with good bowel sounds.  There is no palpable abdominal mass.  There is no fluid wave.  There is no palpable hepatosplenomegaly.  Inguinal exam shows no inguinal adenopathy.  Back: No tenderness over the spine, ribs, or hips.  Extremities:  No clubbing, cyanosis, or edema.  He has good range of motion of his joints.  He does have a tophaceous gouty deposit on the index finger of his right hand. Neurological:  No focal neurological deficits.  LABORATORY STUDIES:  (05/20/2013)  White cell count 12.4, hemoglobin 10.06, hematocrit 33.4, platelet count 188.  His BUN is 28, creatinine 1.95.  Albumin 2.9 with a total protein of 6.2.  IMPRESSION:  Mr. Blakeman is an 79 year old white gentleman with a right supraclavicular mass.  One would have to suspect that this is malignant.  I have already spoken with Radiology.  We will set him up with an ultrasound-guided biopsy of this mass this week.  I am going to hold off on further lab work or x-ray studies until I see what we have with our biopsy.  One would have to think that this is a lymphoma or Hodgkin disease.  He is not the right category for Hodgkin lymphoma, but this is still a possibility.  Thyroid cancer is always a possibility.  I would not think this would be head and neck cancer.  He really does not have any risk factors for head and neck cancer.  Melanoma is certainly a possibility, although he has never had any kind of moles removed.  Again, we will have to await the biopsy results.  Once we get the biopsy results back, we will get him in and figure out how we can help with this.  He does have renal insufficiency.  He does have some cardiomyopathy.  An echocardiogram done I think recently showed an ejection fraction of 30%.  I think if we are going to treat  him with chemotherapy, we will have to be cautious because of his other health conditions.  Again, I will have Mr. Atkerson come back to see [place] once the biopsy results are in.    ______________________________ Josph Macho, M.D. PRE/MEDQ  D:  05/27/2013  T:  05/28/2013  Job:  [id number]

## 2013-05-28 NOTE — Telephone Encounter (Signed)
Pt called wanting rx for colcrys and colchicine to to be printed out and left up front for p/u. This was done on 5.30.2013.Loralee Pacas [facility]

## 2013-05-29 ENCOUNTER — Encounter (HOSPITAL_COMMUNITY): Payer: Self-pay

## 2013-05-29 ENCOUNTER — Ambulatory Visit (HOSPITAL_COMMUNITY)
Admission: RE | Admit: 2013-05-29 | Discharge: 2013-05-29 | Disposition: A | Discharge status: Home / Self Care | Payer: Medicare Other | Source: Ambulatory Visit | Attending: Hematology & Oncology | Admitting: Hematology & Oncology

## 2013-05-29 DIAGNOSIS — Z7901 Long term (current) use of anticoagulants: Secondary | ICD-10-CM | POA: Insufficient documentation

## 2013-05-29 DIAGNOSIS — C490 Malignant neoplasm of connective and soft tissue of head, face and neck: Secondary | ICD-10-CM | POA: Insufficient documentation

## 2013-05-29 DIAGNOSIS — I2510 Atherosclerotic heart disease of native coronary artery without angina pectoris: Secondary | ICD-10-CM | POA: Insufficient documentation

## 2013-05-29 DIAGNOSIS — I509 Heart failure, unspecified: Secondary | ICD-10-CM | POA: Insufficient documentation

## 2013-05-29 DIAGNOSIS — I10 Essential (primary) hypertension: Secondary | ICD-10-CM | POA: Insufficient documentation

## 2013-05-29 DIAGNOSIS — C799 Secondary malignant neoplasm of unspecified site: Secondary | ICD-10-CM

## 2013-05-29 DIAGNOSIS — Z951 Presence of aortocoronary bypass graft: Secondary | ICD-10-CM | POA: Insufficient documentation

## 2013-05-29 DIAGNOSIS — R222 Localized swelling, mass and lump, trunk: Secondary | ICD-10-CM

## 2013-05-29 DIAGNOSIS — I252 Old myocardial infarction: Secondary | ICD-10-CM | POA: Insufficient documentation

## 2013-05-29 DIAGNOSIS — C801 Malignant (primary) neoplasm, unspecified: Secondary | ICD-10-CM

## 2013-05-29 DIAGNOSIS — I4891 Unspecified atrial fibrillation: Secondary | ICD-10-CM | POA: Insufficient documentation

## 2013-05-29 DIAGNOSIS — Z79899 Other long term (current) drug therapy: Secondary | ICD-10-CM | POA: Insufficient documentation

## 2013-05-29 DIAGNOSIS — R221 Localized swelling, mass and lump, neck: Secondary | ICD-10-CM

## 2013-05-29 DIAGNOSIS — E785 Hyperlipidemia, unspecified: Secondary | ICD-10-CM | POA: Insufficient documentation

## 2013-05-29 DIAGNOSIS — E041 Nontoxic single thyroid nodule: Secondary | ICD-10-CM | POA: Insufficient documentation

## 2013-05-29 HISTORY — DX: Malignant (primary) neoplasm, unspecified: C80.1

## 2013-05-29 HISTORY — DX: Secondary malignant neoplasm of unspecified site: C79.9

## 2013-05-29 LAB — CBC
MCV: 83.7 fL (ref 78.0–100.0)
Platelets: 187 10*3/uL (ref 150–400)
RDW: 19.4 % — ABNORMAL HIGH (ref 11.5–15.5)
WBC: 9.1 10*3/uL (ref 4.0–10.5)

## 2013-05-29 LAB — APTT: aPTT: 60 seconds — ABNORMAL HIGH (ref 24–37)

## 2013-05-29 LAB — PROTIME-INR: INR: 1.14 (ref 0.00–1.49)

## 2013-05-29 MED ORDER — MIDAZOLAM HCL 2 MG/2ML IJ SOLN
INTRAMUSCULAR | Status: AC
  Filled 2013-05-29: qty 4

## 2013-05-29 MED ORDER — MIDAZOLAM HCL 2 MG/2ML IJ SOLN
INTRAMUSCULAR | Status: AC | PRN
  Administered 2013-05-29: 1 mg via INTRAVENOUS

## 2013-05-29 MED ORDER — FENTANYL CITRATE 0.05 MG/ML IJ SOLN
INTRAMUSCULAR | Status: AC
  Filled 2013-05-29: qty 4

## 2013-05-29 MED ORDER — FENTANYL CITRATE 0.05 MG/ML IJ SOLN
INTRAMUSCULAR | Status: AC | PRN
  Administered 2013-05-29: 50 ug via INTRAVENOUS

## 2013-05-29 MED ORDER — SODIUM CHLORIDE 0.9 % IV SOLN
INTRAVENOUS | Status: DC
  Administered 2013-05-29: 11:00:00 via INTRAVENOUS

## 2013-05-29 NOTE — Procedures (Signed)
R Thyroid FNA R neck mass FNA and 18 g core No comp

## 2013-05-29 NOTE — H&P (Signed)
Chief Complaint: "I'm here for a biopsy" Referring Physician:Ennever HPI: [name] [name] is an 79 y.o. male with findings of a large rt supraclavicular mass but also a right thyroid nodule. He is scheduled for US guided biopsy of both of these lesions. PMHx and meds reviewed. Pt has been off Coumadin for the past few weeks and has not been on any Lovenox/heparin  Past Medical History:  Past Medical History  Diagnosis Date  . Coronary artery disease   . MI, old     AGE 49  . Ulcerative colitis   . Hyperlipidemia   . Hypertension   . History of nephrolithiasis   . PAF (paroxysmal atrial fibrillation)   . LV dysfunction   . Myocardial infarction   . Anemia   . Blood transfusion   . Arthritis   . CHF (congestive heart failure)   . Mitral regurgitation   . Pneumonia     years ago  . Peripheral vascular disease   . Ileostomy in place 05-08-13    right abdomen  . Acute renal failure     "related ? Kidney stone blockage"  . Gout flare     right index finger-tx. prednisone, allopurinol  . Swelling 05-08-13    rt. base of neck-something recent.    Past Surgical History:  Past Surgical History  Procedure Laterality Date  . Cardiac catheterization  10/14/2010    EF 30%  . Coronary artery bypass graft  10/24/2010    LIMA TO THE LAD, A SAPHENOUS VEIN GRAFT TO THE DIAGONAL 1 AND 3, AND SAPHENOUS VEIN GRAFT TO THE DISTAL RIGHT CORONARY ARTERY  . Ileostomy    . Colectomy    . Cataract extraction    . Transthoracic echocardiogram  01/16/2011    EF 50%  . Cardiovascular stress test  10/07/2010    EF 33%  . Appendectomy    . Cystostomy  04/22/2012    Procedure: CYSTOSTOMY SUPRAPUBIC;  Surgeon: Garnett Farm, MD;  Location: [facility];  Service: Urology;  Laterality: N/A;  CYSTOTOMY WITH MIDLINE INCISION REMOVAL OF STONE  . Tee without cardioversion  08/28/2012    Procedure: TRANSESOPHAGEAL ECHOCARDIOGRAM (TEE);  Surgeon: [name], MD;  Location: [facility] END[facility]COPY;  Service:  Cardiovascular;  Laterality: N/A;  . Cardioversion  08/28/2012    Procedure: CARDIOVERSION;  Surgeon: [name], MD;  Location: [facility] ENDOSCOPY;  Service: Cardiovascular;  Laterality: N/A;  . Cystoscopy w/ ureteral stent placement Right 05/12/2013    Procedure: CYSTOSCOPY WITH RIGHT RETROGRADE PYELOGRAM/ RIGHT URETERAL STENT PLACEMENT;  Surgeon: Garnett Farm, MD;  Location: [facility];  Service: Urology;  Laterality: Right;    Family History:  Family History  Problem Relation Age of Onset  . Colon cancer Mother     colon  . Heart attack Father   . Hypertension Father   . Heart disease Sister   . Hypertension Sister   . Heart disease Brother   . Hypertension Brother     Social History:  reports that he quit smoking about 44 years ago. His smoking use included Cigarettes and Cigars. He has a 15 pack-year smoking history. He does not have any smokeless tobacco history on file. He reports that he does not drink alcohol or use illicit drugs.  Allergies: No Known Allergies  Medications: Allopurinol 100 mg p.o. daily, amiodarone 100 mg p.o.  daily, Coreg 25 mg p.o. b.i.d., colchicine 0.6 mg p.o. daily, iron  sulfate 325 mg p.o. daily, Lasix 40 mg p.o. t.i.d., hydralazine  25 mg  p.o. t.i.d., Imdur 60 mg p.o. daily, potassium 20 mEq p.o. daily, Flomax  0.4 mg p.o. daily, Coumadin 5 mg on Wednesday and 2.5 mg the other days, currently on hold   Please HPI for pertinent positives, otherwise complete 10 system ROS negative.  Physical Exam: BP 114/65  Pulse 51  Temp(Src) 97.2 F (36.2 C) (Oral)  Resp 16  SpO2 98% There is no weight on file to calculate BMI.   General Appearance:  Alert, cooperative, no distress, appears stated age  Head:  Normocephalic, without obvious abnormality, atraumatic  ENT: Unremarkable  Neck: Large palpable NT mass rt supraclavicular mass  Lungs:   Clear to auscultation bilaterally, no w/r/r, respirations unlabored without use of accessory muscles.  Chest  Wall:  No tenderness or deformity  Heart:  Regular rate and rhythm, S1, S2 normal, no murmur, rub or gallop.  Neurologic: Normal affect, no gross deficits.   Results for orders placed during the hospital encounter of 05/29/13 (from the past 48 hour(s))  APTT     Status: Abnormal   Collection Time    05/29/13 11:00 AM      Result Value Range   aPTT 60 (*) 24 - 37 seconds   Comment:            IF BASELINE aPTT IS ELEVATED,     SUGGEST PATIENT RISK ASSESSMENT     BE USED TO DETERMINE APPROPRIATE     ANTICOAGULANT THERAPY.  CBC     Status: Abnormal   Collection Time    05/29/13 11:00 AM      Result Value Range   WBC 9.1  4.0 - 10.5 K/uL   RBC 3.68 (*) 4.22 - 5.81 MIL/uL   Hemoglobin 10.0 (*) 13.0 - 17.0 g/dL   HCT 16.1 (*) 09.6 - 04.5 %   MCV 83.7  78.0 - 100.0 fL   MCH 27.2  26.0 - 34.0 pg   MCHC 32.5  30.0 - 36.0 g/dL   RDW 40.9 (*) 81.1 - 91.4 %   Platelets 187  150 - 400 K/uL  PROTIME-INR     Status: None   Collection Time    05/29/13 11:00 AM      Result Value Range   Prothrombin Time 14.4  11.6 - 15.2 seconds   INR 1.14  0.00 - 1.49   Ct Chest Wo Contrast  05/27/2013   *RADIOLOGY REPORT*  Clinical Data: Follow-up tumor in neck seen on prior CT neck  CT CHEST WITHOUT CONTRAST  Technique:  Multidetector CT imaging of the chest was performed following the standard protocol without IV contrast.  Comparison: Correlation with CT neck dated 05/21/2013  Findings: Mild subpleural reticulation/atelectasis in the bilateral lower lobes.  No suspicious pulmonary nodules.  No pleural effusion or pneumothorax.  18 x 13 mm right thyroid nodule (series 2/image 11).  The heart is normal in size.  No pericardial effusion.  Coronary atherosclerosis. Postsurgical changes related to prior CABG. Atherosclerotic calcifications of the aortic arch.  6.4 x 4.7 cm right supraclavicular mass (series 2/image 3), incompletely visualized.  Associated 2.0 cm short axis node at the right thoracic inlet (series  2/image 12), unchanged.  No suspicious mediastinal, hilar, or axillary lymphadenopathy.  Visualized upper abdomen is notable for a 1.8 cm probable cyst at the hepatic dome (series 2/image 51) and multiple right upper pole renal cysts of varying sizes and complexity, incompletely visualized.  Degenerative changes of the visualized thoracolumbar spine.  IMPRESSION: 6.4 x 4.7  cm right supraclavicular mass, incompletely visualized, worrisome for primary or metastatic malignancy.  Biopsy is suggested.  2.0 cm short axis node at the right thoracic inlet, unchanged, suspicious for nodal metastasis.  1.8 cm right thyroid nodule.  Consider thyroid ultrasound and possibly biopsy as clinically warranted.   Original Report Authenticated By: Charline Bills, M.D.   US Soft Tissue Head/neck  05/28/2013   *RADIOLOGY REPORT*  Clinical Data: Neck mass  THYROID ULTRASOUND  Technique: Ultrasound examination of the thyroid gland and adjacent soft tissues was performed.  Comparison:  None.  Findings:  Right thyroid lobe:  6.4 x 2.5 x 1.9 cm. Left thyroid lobe:  5.5 x 1.8 x 1.7 cm. Isthmus:  3 mm.  Focal nodules:  Multiple bilateral thyroid nodules are visualized. The dominant nodule is in the lower pole of the right lobe measuring 2.2 x 1.8 x 1.6 cm.  This corresponds to the abnormality noted on CT. There is a complex cyst in the upper pole of the left lobe measuring 1.5 x 0.9 x 1.0 cm. Other smaller nodules are scattered.  Lymphadenopathy:  None visualized.  Additional findings:  There is a large heterogeneous mass lateral to the right lobe of the thyroid gland measuring 6.3 x 5.1 x 6.1 cm.  IMPRESSION: Dominant right lower pole nodule.  Fine needle aspiration biopsy is recommended.  Large right neck mass worrisome for malignancy.   Original Report Authenticated By: Jolaine Click, M.D.    Assessment/Plan Rt neck mass and rt thyroid nodule. For US guided biopsy of these lesions. Discussed procedure, risks, complications, use of  sedation. Labs reviewed. Consent signed in chart  Brayton El PA-C 05/29/2013, 12:05 PM

## 2013-05-30 ENCOUNTER — Other Ambulatory Visit: Payer: Self-pay | Admitting: *Deleted

## 2013-05-30 MED ORDER — FUROSEMIDE 40 MG PO TABS: 20.0000 mg | ORAL_TABLET | Freq: Every day | ORAL | Status: DC

## 2013-06-02 NOTE — Progress Notes (Signed)
I spoke to [name] today by phone. I got the pathology results back from his right neck lymph node biopsy. The pathologist called me and said this was carcinoma and likely adenocarcinoma from an upper GI source.  He had scans done back in May. Everything looked okay in the upper GI tract. He's not having any dysphagia. There's no abdominal pain. He's had no bleeding.  I think that we are going to have to send his cancer for genetic testing to see if we cannot come up with a source as the primary.  We will send his specimen to [facility] for genetic testing to see if we cannot come up with a primary site.  This will take about a week or so.  I will call him once I get the results back and that we can plan on some kind of therapeutic intervention.  Cindee Lame

## 2013-06-03 ENCOUNTER — Ambulatory Visit (INDEPENDENT_AMBULATORY_CARE_PROVIDER_SITE_OTHER): Payer: Medicare Other | Admitting: Family Medicine

## 2013-06-03 ENCOUNTER — Encounter: Payer: Self-pay | Admitting: Family Medicine

## 2013-06-03 VITALS — BP 104/66 | HR 63 | Wt 162.0 lb

## 2013-06-03 DIAGNOSIS — M109 Gout, unspecified: Secondary | ICD-10-CM

## 2013-06-03 DIAGNOSIS — R221 Localized swelling, mass and lump, neck: Secondary | ICD-10-CM

## 2013-06-03 DIAGNOSIS — I959 Hypotension, unspecified: Secondary | ICD-10-CM

## 2013-06-03 DIAGNOSIS — R220 Localized swelling, mass and lump, head: Secondary | ICD-10-CM

## 2013-06-03 MED ORDER — NITROGLYCERIN 0.4 MG SL SUBL: 0.4000 mg | SUBLINGUAL_TABLET | SUBLINGUAL | Status: DC | PRN

## 2013-06-03 NOTE — Progress Notes (Signed)
Subjective:    Patient ID: [name] [name], male    DOB: 08/15/1930, 79 y.o.   MRN: [id number]  HPI Here to followup for the mass on his neck. We were able to get him in with hematology oncology. We also arranged for him to get biopsies done. He did hear back yesterday about the reports. It evidently is not coming from his thyroid. It appears that the mass is consistent with some type of metastatic carcinoma probably pancreatic or biliary or possibly upper GI. They have sent off to pathology 2 additional lab for further evaluation he is awaiting the results. Overall he says he feels well. He denies any pain over the mass. No problems swallowing. He did note that his weight is down about 4-5 pounds on his home scale. He does report that his appetite is decreased. There he denies feeling nauseated or having any abdominal discomfort. He has been passing some kidney stones with the family after having lithotripsy performed there he says it really hasn't been that bothersome or painful. He did go to the [place] since I last saw him and they did some blood work and a checkup. No other problems were then applied. They unfortunately did not have a Colcrys is available for his gout but he is taking his allopurinol. 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 He does have a followup appointment with cardiology at the end of July and is seeing his nephrologist Dr. Ballard Russell soon. He does report feeling a little dizzy and off-balance yesterday but feels better today. He says he also felt weak yesterday.  He also had a thyroid biopsy which was fortunately benign. Review of Systems     BP 104/66  Pulse 63  Wt 162 lb (73.483 kg)  BMI 29.62 kg/m2    No Known Allergies  Past Medical History  Diagnosis Date  . Coronary artery disease   . MI, old     AGE 12  . Ulcerative colitis   . Hyperlipidemia   . Hypertension   . History of nephrolithiasis    . PAF (paroxysmal atrial fibrillation)   . LV dysfunction   . Myocardial infarction   . Anemia   . Blood transfusion   . Arthritis   . CHF (congestive heart failure)   . Mitral regurgitation   . Pneumonia     years ago  . Peripheral vascular disease   . Ileostomy in place 05-08-13    right abdomen  . Acute renal failure     "related ? Kidney stone blockage"  . Gout flare     right index finger-tx. prednisone, allopurinol  . Swelling 05-08-13    rt. base of neck-something recent.    Past Surgical History  Procedure Laterality Date  . Cardiac catheterization  10/14/2010    EF 30%  . Coronary artery bypass graft  10/24/2010    LIMA TO THE LAD, A SAPHENOUS VEIN GRAFT TO THE DIAGONAL 1 AND 3, AND SAPHENOUS VEIN GRAFT TO THE DISTAL RIGHT CORONARY ARTERY  . Ileostomy    . Colectomy    . Cataract extraction    . Transthoracic echocardiogram  01/16/2011    EF 50%  . Cardiovascular stress test  10/07/2010    EF 33%  . Appendectomy    . Cystostomy  04/22/2012    Procedure: CYSTOSTOMY SUPRAPUBIC;  Surgeon: Garnett Farm, MD;  Location: [facility];  Service: Urology;  Laterality: N/A;  CYSTOTOMY WITH MIDLINE INCISION REMOVAL OF STONE  . Tee without cardioversion  08/28/2012    Procedure: TRANSESOPHAGEAL ECHOCARDIOGRAM (TEE);  Surgeon: [name], MD;  Location: [facility] ENDOSCOPY;  Service: Cardiovascular;  Laterality: N/A;  . Cardioversion  08/28/2012    Procedure: CARDIOVERSION;  Surgeon: [name], MD;  Location: [facility] ENDOSCOPY;  Service: Cardiovascular;  Laterality: N/A;  . Cystoscopy w/ ureteral stent placement Right 05/12/2013    Procedure: CYSTOSCOPY WITH RIGHT RETROGRADE PYELOGRAM/ RIGHT URETERAL STENT PLACEMENT;  Surgeon: Garnett Farm, MD;  Location: [facility];  Service: Urology;  Laterality: Right;    History   Social History  . Marital Status: Married    Spouse Name: N/A    Number of Children: N/A  . Years of Education: N/A   Occupational History  . Not on file.    Social History Main Topics  . Smoking status: Former Smoker -- 1.00 packs/day for 15 years    Types: Cigarettes, Cigars    Quit date: 12/25/1968  . Smokeless tobacco: Not on file  . Alcohol Use: No     Comment: occasional glass of wine  . Drug Use: No  . Sexually Active: Not Currently   Other Topics Concern  . Not on file   Social History Narrative  . No narrative on file    Family History  Problem Relation Age of Onset  . Colon cancer Mother     colon  . Heart attack Father   . Hypertension Father   . Heart disease Sister   . Hypertension Sister   . Heart disease Brother   . Hypertension Brother   . Heart disease Sister   . Hypertension Sister   . Heart disease Sister   . Hypertension Sister   . Heart disease Brother   . Hypertension Brother   . Heart disease Brother   . Hypertension Brother   . Heart disease Brother   . Hypertension Brother   . Heart disease Brother   . Hypertension Brother   . Heart disease Brother   . Hypertension Brother   . Heart disease Brother   . Hypertension Brother   . Heart disease Brother   . Hypertension Brother   . Stroke Brother     Outpatient Encounter Prescriptions as of 06/03/2013  Medication Sig Dispense Refill  . acetaminophen (TYLENOL) 650 MG CR tablet Take 650 mg by mouth 2 (two) times daily as needed for pain.       [name] allopurinol (ZYLOPRIM) 100 MG tablet Take 100 mg by mouth every morning.      [name] amiodarone (PACERONE) 200 MG tablet Take 100 mg by mouth every evening.      . carvedilol (COREG) 25 MG tablet Take 25 mg by mouth 2 (two) times daily with a meal.      . furosemide (LASIX) 40 MG tablet Take 0.5 tablets (20 mg total) by mouth daily.  30 tablet  3  . hydrALAZINE (APRESOLINE) 25 MG tablet Take 25 mg by mouth 3 (three) times daily.      . isosorbide mononitrate (IMDUR) 60 MG 24 hr tablet Take 30 mg by mouth daily.       . nitroGLYCERIN (NITROSTAT) 0.4 MG SL tablet Place 1 tablet (0.4 mg total) under the tongue  every 5 (five) minutes as needed for chest pain.  30 tablet  0  . Tamsulosin HCl (FLOMAX) 0.4 MG CAPS Take 0.4 mg by mouth every evening.      . [DISCONTINUED] nitroGLYCERIN (  NITROSTAT) 0.4 MG SL tablet Place 0.4 mg under the tongue every 5 (five) minutes as needed for chest pain.       . cholecalciferol (VITAMIN D) 1000 UNITS tablet Take 1,000 Units by mouth daily.      . ferrous sulfate 325 (65 FE) MG tablet Take 325 mg by mouth every evening.      . fish oil-omega-3 fatty acids 1000 MG capsule Take 1 g by mouth 2 (two) times daily.       [name] FLAXSEED, LINSEED, PO Take 1 tablet by mouth at bedtime.       . folic acid (FOLVITE) 400 MCG tablet Take 400 mcg by mouth daily.       . Multiple Vitamin (MULITIVITAMIN WITH MINERALS) TABS Take 1 tablet by mouth daily.      . Multiple Vitamins-Minerals (ICAPS AREDS FORMULA PO) Take 1 capsule by mouth every evening.      [name] omega-3 acid ethyl esters (LOVAZA) 1 G capsule Take 1 capsule (1 g total) by mouth daily.  30 capsule  0  . potassium chloride SA (K-DUR,KLOR-CON) 20 MEQ tablet Take 1 tablet (20 mEq total) by mouth daily.  90 tablet  3  . warfarin (COUMADIN) 5 MG tablet Take 2.5-5 mg by mouth at bedtime. Take 5mg  on Wednesday and 2.5mg  on all other days      . [DISCONTINUED] colchicine 0.6 MG tablet Take 1 tablet (0.6 mg total) by mouth daily.  90 tablet  3  . [DISCONTINUED] COLCRYS 0.6 MG tablet Take 1 tablet (0.6 mg total) by mouth daily.  90 tablet  2   No facility-administered encounter medications on file as of 06/03/2013.       Objective:   Physical Exam  Constitutional: He appears well-developed and well-nourished.  Neck:  Large firm mass on the right side of the mass, supraclavicular. Nontender. No surrounding erythema or rashes.  Cardiovascular: Normal rate and normal heart sounds.   Irregular rhythm  Pulmonary/Chest: Effort normal and breath sounds normal.  Skin: Skin is warm and dry.  Psychiatric: He has a normal mood and affect. His  behavior is normal.          Assessment & Plan:  Neck mass-specimen was sent to additional labs for further pathology workup. It seems like it is most likely consistent with a metastatic lesion. Interestingly he Re: had a CT of the abdomen a few weeks ago when he was having kidney stone problems. We also ordered a CT of the chest thinking that the primary could be for long and it was not. M fortunately he has not experienced any discomfort pain or problems swallowing in the neck. He has had some decreased appetite and weight loss.  Hypertension-his blood pressure is actually low today. He did feel poorly yesterday and I wonder if it was because of blood pressure was low. I would like him to decrease his Imdur to 30 mg. I did refill his prescription for his chair and nitroglycerin. He says he wasn't sure how old his pulse actually are. For now continue the low-dose carvedilol, hydralazine, amiodarone, and Flomax. He does have a followup with his cardiologist soon. If his blood pressures continue to stay persistently low then we may need to consider discontinuing hydralazine at least for a short period of time. He does have a blood pressure cuff at home and says he will start to check his blood pressure couple of times a week and let me know if it's staying low or  if he still continues to 4 feel poorly. Otherwise followup in one month.  Gout-he was unable to get the colchicine through the [place]. Encouraged him to continue his allopurinol since he has noticed improvement in the tophi on his hand. We might be able to increase his dose if needed but we'll need to adjust it based on his renal function. Due to check uric acid when we repeat his lab work.  Time spent 40 minutes, greater than 50% the time spent counseling about his neck mass, blood pressure and his gout.

## 2013-06-04 ENCOUNTER — Encounter: Payer: Self-pay | Admitting: *Deleted

## 2013-06-04 NOTE — Progress Notes (Signed)
Faxed CancerTYPE ID request form to [facility] [phone]. It also contained the pt's insurance card and pathology report from 05/29/13.

## 2013-06-13 ENCOUNTER — Other Ambulatory Visit (HOSPITAL_COMMUNITY)
Admission: RE | Admit: 2013-06-13 | Discharge: 2013-06-13 | Disposition: A | Discharge status: Home / Self Care | Payer: Medicare Other | Source: Ambulatory Visit | Attending: Hematology & Oncology | Admitting: Hematology & Oncology

## 2013-06-13 DIAGNOSIS — C801 Malignant (primary) neoplasm, unspecified: Secondary | ICD-10-CM | POA: Insufficient documentation

## 2013-06-19 ENCOUNTER — Telehealth: Payer: Self-pay

## 2013-06-19 ENCOUNTER — Other Ambulatory Visit: Payer: Self-pay | Admitting: *Deleted

## 2013-06-19 MED ORDER — FUROSEMIDE 40 MG PO TABS: 20.0000 mg | ORAL_TABLET | Freq: Every day | ORAL | Status: DC

## 2013-06-19 NOTE — Telephone Encounter (Signed)
Pt called wanting office to fill medication with [facility] for 3 months. Pharmacy changed pt medication to 3 months supply. Pt is aware.

## 2013-06-19 NOTE — Telephone Encounter (Signed)
Received a call from patient he stated he recently had kidney stone removed and a stent placed.Stated Dr.Ottelin removed stent 06/05/13.Stated he was never told when to restart coumadin.Stated bleeding has stopped.Advised to restart coumadin and call pcp who follows coumadin to schedule INR appointment.

## 2013-06-20 ENCOUNTER — Telehealth: Payer: Self-pay | Admitting: Hematology & Oncology

## 2013-06-20 ENCOUNTER — Encounter: Payer: Self-pay | Admitting: Hematology & Oncology

## 2013-06-20 NOTE — Telephone Encounter (Signed)
I spoke with Mr. Baria today. We find got back the results from the genetic profiling assay of his right supraclavicular mass. It came back 90% confident for lung adenocarcinoma.  I told Mr. Pagliarulo that we now need to do genetic mutation studies to see if we can use a pill that targets the specific mutation that his tumor may have.  I called pathology and they will run the genetic mutation studies for [place]. This will take another week or so.  He is not symptomatic. He's had no pain. There is no dysphasia. He's had no cough.  If he is negative for a mutation, then we may consider chemotherapy with radiation therapy as he does has locally advanced disease as far as I can tell.  I will get back with him as soon as I have the genetic studies reported to me.  Cindee Lame

## 2013-06-30 ENCOUNTER — Telehealth: Payer: Self-pay | Admitting: Hematology & Oncology

## 2013-06-30 NOTE — Telephone Encounter (Signed)
[name] [name] pt called looking for results, she will let MD know. Pt aware

## 2013-07-02 ENCOUNTER — Other Ambulatory Visit: Payer: Self-pay | Admitting: Hematology & Oncology

## 2013-07-03 ENCOUNTER — Encounter: Payer: Self-pay | Admitting: Hematology & Oncology

## 2013-07-03 ENCOUNTER — Ambulatory Visit (HOSPITAL_BASED_OUTPATIENT_CLINIC_OR_DEPARTMENT_OTHER): Payer: Medicare Other | Admitting: Hematology & Oncology

## 2013-07-03 VITALS — BP 128/60 | HR 66 | Temp 98.1°F | Resp 18 | Ht 62.0 in | Wt 160.0 lb

## 2013-07-03 DIAGNOSIS — C3490 Malignant neoplasm of unspecified part of unspecified bronchus or lung: Secondary | ICD-10-CM

## 2013-07-03 DIAGNOSIS — C801 Malignant (primary) neoplasm, unspecified: Secondary | ICD-10-CM

## 2013-07-03 DIAGNOSIS — C3491 Malignant neoplasm of unspecified part of right bronchus or lung: Secondary | ICD-10-CM

## 2013-07-03 NOTE — Progress Notes (Signed)
This office note has been dictated.

## 2013-07-04 ENCOUNTER — Other Ambulatory Visit: Payer: Self-pay | Admitting: *Deleted

## 2013-07-04 ENCOUNTER — Encounter: Payer: Self-pay | Admitting: *Deleted

## 2013-07-04 ENCOUNTER — Ambulatory Visit (INDEPENDENT_AMBULATORY_CARE_PROVIDER_SITE_OTHER): Payer: Medicare Other | Admitting: Family Medicine

## 2013-07-04 VITALS — BP 95/63 | HR 85 | Wt 162.0 lb

## 2013-07-04 DIAGNOSIS — C3491 Malignant neoplasm of unspecified part of right bronchus or lung: Secondary | ICD-10-CM

## 2013-07-04 DIAGNOSIS — I4891 Unspecified atrial fibrillation: Secondary | ICD-10-CM

## 2013-07-04 DIAGNOSIS — Z7901 Long term (current) use of anticoagulants: Secondary | ICD-10-CM

## 2013-07-04 LAB — POCT INR: INR: 2.5

## 2013-07-04 NOTE — Progress Notes (Signed)
  Subjective:    Patient ID: [name] [name], male    DOB: October 03, 1930, 79 y.o.   MRN: [id number]  HPI    Review of Systems     Objective:   Physical Exam        Assessment & Plan:  Tried to call pt- no answer and voicemail not set up. Barry Dienes, LPN @ 6:21HY

## 2013-07-04 NOTE — Progress Notes (Signed)
CC:   [name], M.D. Cecille Aver, M.D. Veverly Fells. [name], M.D.  DIAGNOSIS:  Metastatic adenocarcinoma of the lung, likely occult primary.  CURRENT THERAPY:  Observation.  INTERIM HISTORY:  [name] comes in for followup.  We have done all of our studies so far.  We did get a biopsy on his right supraclavicular mass.  The pathology report ([id number]) showed metastatic carcinoma. It was felt to be an adenocarcinoma.  It was thought that an upper GI primary was likely.  I then sent the tumor off for genetic profiling.  We had the tumor sent to [facility].  The report came back 90% probable for lung adenocarcinoma.  Again, all of his staging studies do not show any obvious lung primary site.  He has not had any problems pain wise.  There is no weakness in his right arm.  He has had no swelling.  He has had no dysphagia or odynophagia.  PHYSICAL EXAMINATION:  General:  This is an elderly but fairly well- nourished, white gentleman in no obvious distress.  Vital signs: Temperature of 98.1, pulse 66, respiratory rate 18, blood pressure 128/60.  Weight is 160.  Head and neck:  Normocephalic, atraumatic skull.  There are no ocular or oral lesions.  There are no palpable cervical lymph nodes.  Does have the stable right supraclavicular mass. It is firm.  It is nonmobile.  It is nontender.  Lungs:  Clear bilaterally.  Cardiac:  Regular rate and rhythm with a normal S1, S2. There are no murmurs, rubs or bruits.  Abdomen:  Soft with good bowel sounds.  There is no palpable abdominal mass.  There is no fluid wave. There is no palpable hepatosplenomegaly.  Extremities:  No clubbing, cyanosis or edema.  He has good strength in his arms.  Neurological:  No focal neurological deficits.  LABORATORY STUDIES:  Not done this visit.  IMPRESSION:  [name] is a very nice 79 year old gentleman with what appears to be an adenocarcinoma of the lung.  This was identified  by genetic profiling.  I am also including mutation assay since this is likely adenocarcinoma of the lung.  I do not think he is a smoker.  If he is, he has not smoked for about 40 years.  I spoke with Radiation Oncology.  I believe that radiation therapy would be the best way to go right now to try to prevent problems with this tumor.  We will get a PET scan on him.  Maybe the PET scan could show [place] a primary site.  I do not see that we need to do anything else with [name].  I do not think we need an MRI or CT of the brain as he is asymptomatic.  I want to see [name] back in about a month or so.  By then, he should be close to finishing his radiation treatments.    ______________________________ [name] [name], M.D. PRE/MEDQ  D:  07/03/2013  T:  07/04/2013  Job:  [id number]

## 2013-07-04 NOTE — Progress Notes (Signed)
INR was 2.5. Rhonda [name],CMA

## 2013-07-04 NOTE — Progress Notes (Signed)
  Subjective:    Patient ID: [name] [name], male    DOB: 1930/07/26, 79 y.o.   MRN: [id number]  HPI    Review of Systems     Objective:   Physical Exam        Assessment & Plan:  LMOM of Coumadin instructions. Barry Dienes, LPN

## 2013-07-05 ENCOUNTER — Other Ambulatory Visit: Payer: Self-pay | Admitting: Cardiology

## 2013-07-07 ENCOUNTER — Other Ambulatory Visit: Payer: Self-pay | Admitting: *Deleted

## 2013-07-07 MED ORDER — CARVEDILOL 6.25 MG PO TABS: 6.2500 mg | ORAL_TABLET | Freq: Two times a day (BID) | ORAL | Status: DC

## 2013-07-10 ENCOUNTER — Encounter (HOSPITAL_COMMUNITY)
Admission: RE | Admit: 2013-07-10 | Discharge: 2013-07-10 | Disposition: A | Discharge status: Home / Self Care | Payer: Medicare Other | Source: Ambulatory Visit | Attending: Hematology & Oncology | Admitting: Hematology & Oncology

## 2013-07-10 ENCOUNTER — Encounter (HOSPITAL_COMMUNITY): Payer: Self-pay

## 2013-07-10 DIAGNOSIS — C3490 Malignant neoplasm of unspecified part of unspecified bronchus or lung: Secondary | ICD-10-CM | POA: Insufficient documentation

## 2013-07-10 DIAGNOSIS — C801 Malignant (primary) neoplasm, unspecified: Secondary | ICD-10-CM | POA: Insufficient documentation

## 2013-07-10 DIAGNOSIS — C3491 Malignant neoplasm of unspecified part of right bronchus or lung: Secondary | ICD-10-CM

## 2013-07-10 LAB — GLUCOSE, CAPILLARY: Glucose-Capillary: 78 mg/dL (ref 70–99)

## 2013-07-10 MED ORDER — FLUDEOXYGLUCOSE F - 18 (FDG) INJECTION
14.5000 | Freq: Once | INTRAVENOUS | Status: AC | PRN
  Administered 2013-07-10: 14.5 via INTRAVENOUS

## 2013-07-11 ENCOUNTER — Encounter: Payer: Self-pay | Admitting: Radiation Oncology

## 2013-07-11 DIAGNOSIS — C799 Secondary malignant neoplasm of unspecified site: Secondary | ICD-10-CM | POA: Insufficient documentation

## 2013-07-11 NOTE — Progress Notes (Signed)
Thoracic Location of Tumor / Histology: possible lung primary by genetic profiling  Patient presented 2 months ago with symptoms of: knot in right side of neck x 2 months  Biopsies of right neck mass (if applicable) revealed:  Soft Tissue Needle Core Biopsy, right neck mass - METASTATIC CARCINOMA, SEE COMMENT.  Diagnosis NECK, FINE NEEDLE ASPIRATION, RIGHT: MALIGNANT CELLS PRESENT SEE COMMENT COMMENT: PLEASE SEE THE CORRESPONDING SURGICAL PATHOLOGY REPORT FOR FURTHER CHARACTERIZATION OF THE MALIGNANT CELLS.  Tobacco/Marijuana/Snuff/ETOH use: former smoker, quit 12/25/68, smoked cigars/cigarettes 1 ppd x 15 years, occasional wine  Past/Anticipated interventions by cardiothoracic surgery, if any: none  Past/Anticipated interventions by medical oncology, if any: none  Signs/Symptoms  Weight changes, if any: none  Respiratory complaints, if any: no  Hemoptysis, if any: no  Pain issues, if any:  Yes, chronic arthritic pain in knees, occasional painful gout in right index finger, back pain w/minimal exertion or lengthy standing  SAFETY ISSUES:  Prior radiation? no  Pacemaker/ICD? no  Possible current pregnancy? na  Is the patient on methotrexate? no  Current Complaints / other details:  Married, fought in [place] War. Pt denies pain in right neck area, denies fatigue, loss of appetite, difficulty eating/ swallowing, breathing, coughing, or SOB.

## 2013-07-14 ENCOUNTER — Telehealth: Payer: Self-pay | Admitting: Hematology & Oncology

## 2013-07-14 ENCOUNTER — Ambulatory Visit
Admission: RE | Admit: 2013-07-14 | Discharge: 2013-07-14 | Disposition: A | Discharge status: Home / Self Care | Payer: Medicare Other | Source: Ambulatory Visit | Attending: Radiation Oncology | Admitting: Radiation Oncology

## 2013-07-14 ENCOUNTER — Encounter: Payer: Self-pay | Admitting: Radiation Oncology

## 2013-07-14 VITALS — BP 99/58 | HR 53 | Temp 98.7°F | Resp 20 | Wt 162.4 lb

## 2013-07-14 DIAGNOSIS — C801 Malignant (primary) neoplasm, unspecified: Secondary | ICD-10-CM | POA: Insufficient documentation

## 2013-07-14 DIAGNOSIS — C799 Secondary malignant neoplasm of unspecified site: Secondary | ICD-10-CM

## 2013-07-14 HISTORY — DX: Malignant (primary) neoplasm, unspecified: C80.1

## 2013-07-14 HISTORY — DX: Secondary malignant neoplasm of unspecified site: C79.9

## 2013-07-14 NOTE — Progress Notes (Signed)
Please see the Nurse Progress Note in the MD Initial Consult Encounter for this patient. 

## 2013-07-14 NOTE — Telephone Encounter (Signed)
Pt came here for Rad Onc appointment. Clydie Braun aware pt is on the way over there.

## 2013-07-14 NOTE — Progress Notes (Signed)
Radiation Oncology         [phone] ________________________________  Initial outpatient Consultation  Name: [name] [name] MRN: [id number]  Date: 07/14/2013  DOB: 1930/05/25  WJ:[id number],FAOZHYQMV, MD  [name] Hidalgo Rose Phi, MD   REFERRING PHYSICIAN: Josph Macho, MD  DIAGNOSIS: Probable stage IV adenocarcinoma of the lung  HISTORY OF PRESENT ILLNESS::[name] [name] is a 79 y.o. male who is seen out of the courtesy of Dr. Arlan Organ for an opinion concerning radiation therapy as part of management of patient's recently diagnosed what appears to be stage IV lung cancer. The patient presented with a large right supraclavicular mass which was noticed by the patient's wife. the patient did not experience any pain in this area or other problems. Patient did undergo needle core biopsy of this right neck mass revealing metastatic carcinoma. molecular evaluation revealed 90% confidence the the primary site was lung. Patient recently completed a PET scan which showed activity in the right supraclavicular mass upper mediastinum and bilateral adrenal area. In light of the size of the right supraclavicular mass with anticipated problems in the near future radiation therapy is been consulted for consideration for palliative treatment.[name]  PREVIOUS RADIATION THERAPY: No  PAST MEDICAL HISTORY:  has a past medical history of Coronary artery disease; MI, old; Ulcerative colitis; Hyperlipidemia; Hypertension; History of nephrolithiasis; PAF (paroxysmal atrial fibrillation); LV dysfunction; Myocardial infarction; Anemia; Blood transfusion; Arthritis; CHF (congestive heart failure); Mitral regurgitation; Pneumonia; Peripheral vascular disease; Ileostomy in place (05-08-13); Acute renal failure; Gout flare; Swelling (05-08-13); Cancer (05/29/13); and Metastatic adenocarcinoma (05/29/13).    PAST SURGICAL HISTORY: Past Surgical History  Procedure Laterality Date  . Cardiac catheterization  10/14/2010    EF 30%  .  Coronary artery bypass graft  10/24/2010    LIMA TO THE LAD, A SAPHENOUS VEIN GRAFT TO THE DIAGONAL 1 AND 3, AND SAPHENOUS VEIN GRAFT TO THE DISTAL RIGHT CORONARY ARTERY  . Ileostomy    . Colectomy    . Cataract extraction    . Transthoracic echocardiogram  01/16/2011    EF 50%  . Cardiovascular stress test  10/07/2010    EF 33%  . Appendectomy    . Cystostomy  04/22/2012    Procedure: CYSTOSTOMY SUPRAPUBIC;  Surgeon: Garnett Farm, MD;  Location: [facility];  Service: Urology;  Laterality: N/A;  CYSTOTOMY WITH MIDLINE INCISION REMOVAL OF STONE  . Tee without cardioversion  08/28/2012    Procedure: TRANSESOPHAGEAL ECHOCARDIOGRAM (TEE);  Surgeon: [name], MD;  Location: [facility] ENDOSCOPY;  Service: Cardiovascular;  Laterality: N/A;  . Cardioversion  08/28/2012    Procedure: CARDIOVERSION;  Surgeon: [name], MD;  Location: [facility] ENDOSCOPY;  Service: Cardiovascular;  Laterality: N/A;  . Cystoscopy w/ ureteral stent placement Right 05/12/2013    Procedure: CYSTOSCOPY WITH RIGHT RETROGRADE PYELOGRAM/ RIGHT URETERAL STENT PLACEMENT;  Surgeon: Garnett Farm, MD;  Location: [facility];  Service: Urology;  Laterality: Right;    FAMILY HISTORY: family history includes Colon cancer in his mother; Heart attack in his father; Heart disease in his brothers and sisters; Hypertension in his brothers, father, and sisters; and Stroke in his brother.  SOCIAL HISTORY:  reports that he quit smoking about 44 years ago. His smoking use included Cigarettes and Cigars. He has a 15 pack-year smoking history. He does not have any smokeless tobacco history on file. He reports that he does not drink alcohol or use illicit drugs.  ALLERGIES: Review of patient's allergies indicates no known allergies.  MEDICATIONS:  Current Outpatient  Prescriptions  Medication Sig Dispense Refill  . acetaminophen (TYLENOL) 650 MG CR tablet Take 650 mg by mouth 2 (two) times daily as needed for pain.       [name] allopurinol (ZYLOPRIM) 100 MG  tablet Take 100 mg by mouth every morning.      [name] amiodarone (PACERONE) 200 MG tablet Take 100 mg by mouth every evening.      . carvedilol (COREG) 6.25 MG tablet Take 1 tablet (6.25 mg total) by mouth 2 (two) times daily with a meal.  180 tablet  3  . cholecalciferol (VITAMIN D) 1000 UNITS tablet Take 1,000 Units by mouth daily.      . ferrous sulfate 325 (65 FE) MG tablet Take 325 mg by mouth every evening.      . fish oil-omega-3 fatty acids 1000 MG capsule Take 1 g by mouth 2 (two) times daily.       [name] FLAXSEED, LINSEED, PO Take 1 tablet by mouth at bedtime.       . folic acid (FOLVITE) 400 MCG tablet Take 400 mcg by mouth daily.       . furosemide (LASIX) 40 MG tablet Take 0.5 tablets (20 mg total) by mouth daily.  45 tablet  3  . hydrALAZINE (APRESOLINE) 25 MG tablet Take 25 mg by mouth 3 (three) times daily.      . isosorbide mononitrate (IMDUR) 60 MG 24 hr tablet Take 30 mg by mouth daily.       . Multiple Vitamin (MULITIVITAMIN WITH MINERALS) TABS Take 1 tablet by mouth daily.      . Multiple Vitamins-Minerals (ICAPS AREDS FORMULA PO) Take 1 capsule by mouth every evening.      . nitroGLYCERIN (NITROSTAT) 0.4 MG SL tablet Place 1 tablet (0.4 mg total) under the tongue every 5 (five) minutes as needed for chest pain.  30 tablet  0  . omega-3 acid ethyl esters (LOVAZA) 1 G capsule Take 1 capsule (1 g total) by mouth daily.  30 capsule  0  . potassium chloride SA (K-DUR,KLOR-CON) 20 MEQ tablet Take 1 tablet (20 mEq total) by mouth daily.  90 tablet  3  . Tamsulosin HCl (FLOMAX) 0.4 MG CAPS Take 0.4 mg by mouth every evening.      . warfarin (COUMADIN) 5 MG tablet Take 2.5-5 mg by mouth at bedtime. Take 5mg  on Wednesday and 2.5mg  on all other days      . [DISCONTINUED] pantoprazole (PROTONIX) 40 MG tablet Take 40 mg by mouth daily.         No current facility-administered medications for this encounter.    REVIEW OF SYSTEMS:  A 15 point review of systems is documented in the electronic  medical record. This was obtained by the nursing staff. However, I reviewed this with the patient to discuss relevant findings and make appropriate changes.  He denies any pain in the neck or chest region. Patient denies any new bony pain, headaches dizziness or blurred vision. He does have chronic pain in his knee areas. He has noticed fatigue recently and has subsequently stopped walking for exercise. he denies any shortness of breath or hemoptysis.   PHYSICAL EXAM:  weight is 162 lb 6.4 oz (73.664 kg). His oral temperature is 98.7 F (37.1 C). His blood pressure is 99/58 and his pulse is 53. His respiration is 20 and oxygen saturation is 100%.   BP 99/58  Pulse 53  Temp(Src) 98.7 F (37.1 C) (Oral)  Resp 20  Wt 162  lb 6.4 oz (73.664 kg)  BMI 29.7 kg/m2  SpO2 100%  General Appearance:    Alert, cooperative, no distress, appears stated age, accompanied by his wife   Head:    Normocephalic, without obvious abnormality, atraumatic  Eyes:   the pupils are equal round and reactive to light. The extraocular eye movements are intact      Nose:   Nares normal, septum midline, mucosa normal, no drainage    or sinus tenderness  Throat:   Lips, mucosa, and tongue normal; gums normal, dentures in place, no mucosal lesions   Neck:   Supple, symmetrical, trachea midline, large right supraclavicular mass measuring approximately 9 x 10 cm in size. This is non-tender with palpation. No skin erosion             Back:     Symmetric, no curvature, ROM normal, no CVA tenderness  Lungs:     Clear to auscultation bilaterally, respirations unlabored  Chest wall:    No tenderness or deformity  Heart:    Regular rate and rhythm,     Abdomen:     Soft, non-tender, bowel sounds active all four quadrants,   Colostomy in place in the right lower quadrant         Extremities:   Extremities normal, atraumatic, some edema in the ankle and foot areas   Pulses:   2+ and symmetric all extremities  Skin:   Skin color,  texture, turgor normal, no rashes or lesions  Lymph nodes:    axillary nodes normal  Neurologic:   CNII-XII intact. Normal strength, sensation and reflexes      throughout    LABORATORY DATA:  Lab Results  Component Value Date   WBC 9.1 05/29/2013   HGB 10.0* 05/29/2013   HCT 30.8* 05/29/2013   MCV 83.7 05/29/2013   PLT 187 05/29/2013   Lab Results  Component Value Date   NA 138 05/20/2013   K 4.3 05/20/2013   CL 106 05/20/2013   CO2 22 05/20/2013   Lab Results  Component Value Date   ALT 21 05/20/2013   AST 26 05/20/2013   ALKPHOS 83 05/20/2013   BILITOT 0.9 05/20/2013     RADIOGRAPHY: Nm Pet Image Initial (pi) Skull Base To Thigh  07/10/2013   *RADIOLOGY REPORT*  Clinical Data: Initial treatment strategy for lung cancer.  NUCLEAR MEDICINE PET SKULL BASE TO THIGH  Fasting Blood Glucose:  78  Technique:  14.5 mCi F-18 FDG was injected intravenously. CT data was obtained and used for attenuation correction and anatomic localization only.  (This was not acquired as a diagnostic CT examination.) Additional exam technical data entered on technologist worksheet.  Comparison:  Chest CT 05/27/2013  Findings:  Neck: Large bulky lower right neck and supraclavicular mass which appears to be continuous with the right thoracic inlet/upper mediastinal nodal mass. Marked FDG uptake with SUV max of 29.1. No left-sided neck mass or adenopathy.  Chest:  Large right-sided upper mediastinal / thoracic inlet nodal mass contiguous with the supraclavicular mass demonstrating neoplastic range FDG uptake with SUV max of 29.1.  There is also a large upper mediastinal nodal mass with SUV max of 26.4.  No lower mediastinal disease or hilar disease.  Reviewing the chest CT I do not see any obvious metastatic pulmonary nodules.  Abdomen/Pelvis:  Low-level increased uptake in both adrenal glands suspicious for metastatic disease.  There are multiple bilateral renal cysts, some which are hyperdense/hemorrhagic.  No worrisome renal  lesions.  No findings for hepatic metastatic disease.  The simple appearing hepatic cyst is noted.  No abdominal/pelvic lymphadenopathy.  There is asymmetric left-sided bladder wall thickening but no obvious abnormal FDG uptake to suggest tumor.  A bladder calculus is noted.  Prostate gland enlargement is present.  There is a right lower quadrant ileostomy and surgical changes from a complete colectomy.  Skeleton:  No focal hypermetabolic activity to suggest skeletal metastasis.  IMPRESSION:  1. Bulky supraclavicular nodal mass with neoplastic FDG uptake. This is contiguous with right upper mediastinal / thoracic inlet adenopathy and right-sided mediastinal adenopathy.  No discrete parenchymal lung mass. 2.  Bilateral adrenal gland lesions showing low level FDG uptake and worrisome for metastatic disease. 3.  No other significant abdominal/pelvic findings.  Incidental findings as discussed above.   Original Report Authenticated By: Rudie Meyer, M.D.      IMPRESSION: Probable stage IV adenocarcinoma of the lung, presenting with a large right supraclavicular mass. I would recommend palliative radiation therapy for this large mass. Anticipate this would cause  symptoms for the patient in the near future. The patient's radiation fields would encompass this mass as well as the upper mediastinal disease light of its close proximity to the right supraclavicular mass. I anticipate between 3 and 5 weeks of radiation therapy.  PLAN: Simulation and planning tomorrow.  I spent 60 minutes minutes face to face with the patient and more than 50% of that time was spent in counseling and/or coordination of care.   ------------------------------------------------ -----------------------------------  Billie Lade, PhD, MD

## 2013-07-15 ENCOUNTER — Ambulatory Visit
Admission: RE | Admit: 2013-07-15 | Discharge: 2013-07-15 | Disposition: A | Discharge status: Home / Self Care | Payer: Medicare Other | Source: Ambulatory Visit | Attending: Radiation Oncology | Admitting: Radiation Oncology

## 2013-07-15 DIAGNOSIS — Z510 Encounter for antineoplastic radiation therapy: Secondary | ICD-10-CM | POA: Insufficient documentation

## 2013-07-15 DIAGNOSIS — Z7901 Long term (current) use of anticoagulants: Secondary | ICD-10-CM | POA: Insufficient documentation

## 2013-07-15 DIAGNOSIS — C801 Malignant (primary) neoplasm, unspecified: Secondary | ICD-10-CM | POA: Insufficient documentation

## 2013-07-15 DIAGNOSIS — Z79899 Other long term (current) drug therapy: Secondary | ICD-10-CM | POA: Insufficient documentation

## 2013-07-15 DIAGNOSIS — C799 Secondary malignant neoplasm of unspecified site: Secondary | ICD-10-CM

## 2013-07-15 DIAGNOSIS — C3490 Malignant neoplasm of unspecified part of unspecified bronchus or lung: Secondary | ICD-10-CM | POA: Insufficient documentation

## 2013-07-15 DIAGNOSIS — R220 Localized swelling, mass and lump, head: Secondary | ICD-10-CM | POA: Insufficient documentation

## 2013-07-15 NOTE — Progress Notes (Signed)
  Radiation Oncology         [phone] ________________________________  Name: [name] [name] MRN: [id number]  Date: 07/15/2013  DOB: 10/01/1930  SIMULATION AND TREATMENT PLANNING NOTE  DIAGNOSIS:  Probable stage IV adenocarcinoma of the lung  NARRATIVE:  The patient was brought to the CT Simulation planning suite.  Identity was confirmed.  All relevant records and images related to the planned course of therapy were reviewed.  The patient freely provided informed written consent to proceed with treatment after reviewing the details related to the planned course of therapy. The consent form was witnessed and verified by the simulation staff.  Then, the patient was set-up in a stable reproducible  supine position for radiation therapy.  CT images were obtained.  Surface markings were placed.  The CT images were loaded into the planning software.  Then the target and avoidance structures were contoured.  Treatment planning then occurred.  The radiation prescription was entered and confirmed.  Then, I designed and supervised the construction of a total of 3 medically necessary complex treatment devices.  I have requested : Isodose Plan.  I have ordered:dose calc.  PLAN:  The patient will receive 35 Gy in 14 fractions.  ________________________________ -----------------------------------  Billie Lade, PhD, MD

## 2013-07-17 ENCOUNTER — Ambulatory Visit: Payer: Medicare Other | Admitting: Cardiology

## 2013-07-18 ENCOUNTER — Ambulatory Visit
Admission: RE | Admit: 2013-07-18 | Discharge: 2013-07-18 | Disposition: A | Discharge status: Home / Self Care | Payer: Medicare Other | Source: Ambulatory Visit | Attending: Radiation Oncology | Admitting: Radiation Oncology

## 2013-07-18 DIAGNOSIS — C801 Malignant (primary) neoplasm, unspecified: Secondary | ICD-10-CM

## 2013-07-18 DIAGNOSIS — C799 Secondary malignant neoplasm of unspecified site: Secondary | ICD-10-CM

## 2013-07-18 NOTE — Progress Notes (Signed)
  Radiation Oncology         [phone] ________________________________  Name: [name] MRN: [id number]  Date: 07/18/2013  DOB: 09-10-1930  Simulation Verification Note   NARRATIVE: The patient was brought to the treatment unit and placed in the planned treatment position. The clinical setup was verified. Then port films and CT scan were obtained and uploaded to the radiation oncology medical record software.  The treatment beams were carefully compared against the planned radiation fields. The position, location, and shape of the radiation fields was reviewed. The targeted volume of tissue appears to be appropriately covered by the radiation beams. Based on my personal review, I approved the simulation verification. The patient's treatment will proceed as planned.  ________________________________   Radene Gunning, MD, PhD

## 2013-07-21 ENCOUNTER — Encounter: Payer: Self-pay | Admitting: Cardiology

## 2013-07-21 ENCOUNTER — Telehealth: Payer: Self-pay | Admitting: *Deleted

## 2013-07-21 ENCOUNTER — Ambulatory Visit
Admission: RE | Admit: 2013-07-21 | Discharge: 2013-07-21 | Disposition: A | Discharge status: Home / Self Care | Payer: Medicare Other | Source: Ambulatory Visit | Attending: Radiation Oncology | Admitting: Radiation Oncology

## 2013-07-21 ENCOUNTER — Ambulatory Visit (INDEPENDENT_AMBULATORY_CARE_PROVIDER_SITE_OTHER): Payer: Medicare Other | Admitting: Cardiology

## 2013-07-21 VITALS — BP 112/70 | HR 65 | Ht 62.0 in | Wt 160.4 lb

## 2013-07-21 DIAGNOSIS — I340 Nonrheumatic mitral (valve) insufficiency: Secondary | ICD-10-CM

## 2013-07-21 DIAGNOSIS — I2510 Atherosclerotic heart disease of native coronary artery without angina pectoris: Secondary | ICD-10-CM

## 2013-07-21 DIAGNOSIS — I4891 Unspecified atrial fibrillation: Secondary | ICD-10-CM

## 2013-07-21 DIAGNOSIS — E785 Hyperlipidemia, unspecified: Secondary | ICD-10-CM

## 2013-07-21 DIAGNOSIS — I25810 Atherosclerosis of coronary artery bypass graft(s) without angina pectoris: Secondary | ICD-10-CM

## 2013-07-21 DIAGNOSIS — I5032 Chronic diastolic (congestive) heart failure: Secondary | ICD-10-CM

## 2013-07-21 DIAGNOSIS — I059 Rheumatic mitral valve disease, unspecified: Secondary | ICD-10-CM

## 2013-07-21 DIAGNOSIS — I509 Heart failure, unspecified: Secondary | ICD-10-CM

## 2013-07-21 DIAGNOSIS — N189 Chronic kidney disease, unspecified: Secondary | ICD-10-CM

## 2013-07-21 LAB — BASIC METABOLIC PANEL
CO2: 24 mEq/L (ref 19–32)
Calcium: 9.2 mg/dL (ref 8.4–10.5)
Chloride: 106 mEq/L (ref 96–112)
Glucose, Bld: 98 mg/dL (ref 70–99)
Sodium: 135 mEq/L (ref 135–145)

## 2013-07-21 LAB — LIPID PANEL
HDL: 26.6 mg/dL — ABNORMAL LOW (ref >39.00)
Total CHOL/HDL Ratio: 3

## 2013-07-21 LAB — HEPATIC FUNCTION PANEL
ALT: 32 U/L (ref 0–53)
AST: 38 U/L — ABNORMAL HIGH (ref 0–37)
Alkaline Phosphatase: 84 U/L (ref 39–117)
Bilirubin, Direct: 0.1 mg/dL (ref 0.0–0.3)
Total Bilirubin: 0.4 mg/dL (ref 0.3–1.2)

## 2013-07-21 LAB — TSH: TSH: 1.06 u[IU]/mL (ref 0.35–5.50)

## 2013-07-21 NOTE — Patient Instructions (Addendum)
Your physician recommends that you have a lipid profile /BMET/Magnesium/Liver profile/TSH.   Your physician recommends that you schedule a follow-up appointment in: 4 months with Dr [name].

## 2013-07-21 NOTE — Telephone Encounter (Signed)
Attempted to return the pt's call regarding his additional pending results from [facility] but was unable to leave a message on his home # but did leave one on his wife cell phone listed in the EMR. Asked him to call the office to and leave a good call back # to call back if necessary. Did let him know that the [employer] Sales Representative is now involved in his case.

## 2013-07-22 ENCOUNTER — Ambulatory Visit
Admission: RE | Admit: 2013-07-22 | Discharge: 2013-07-22 | Disposition: A | Discharge status: Home / Self Care | Payer: Medicare Other | Source: Ambulatory Visit | Attending: Radiation Oncology | Admitting: Radiation Oncology

## 2013-07-22 VITALS — BP 106/47 | HR 58 | Temp 97.5°F | Resp 20 | Wt 160.6 lb

## 2013-07-22 DIAGNOSIS — C799 Secondary malignant neoplasm of unspecified site: Secondary | ICD-10-CM

## 2013-07-22 NOTE — Progress Notes (Signed)
Patient ID: [name] [name], male   DOB: 1930-05-15, 79 y.o.   MRN: [id number] PCP: Dr. Linford Arnold  79 yo with history of CAD s/p CABG, ulcerative colitis s/p colectomy, and CHF presents for cardiology followup.  He had CABG in 10/11 complicated by post-operative atrial fibrillation.  He was started on amiodarone while in the hospital after CABG and this was continued due to risk of further atrial fibrillation in the setting of significantly enlarged left atrium.  He was admitted to the hospital in [place] [address] in 7/12 with a diastolic CHF exacerbation and was diuresed.  I had him stop amiodarone in 2012 given no atrial fibrillation recurrence.    In 7/13, he began to note increased exertional dyspnea.  He went to the hospital in [place] in 7/13 with a diastolic CHF exacerbation.  I am not sure if he was in atrial fibrillation at that time as I have no records of that hospitalization.  It sounds like he was only in the hospital for 2-3 days.  He had an echo done there with EF 35-40%, severe MR, PA systolic pressure 50-60 mmHg.   At a prior appointment, [name] was in atrial fibrillation with RVR.  I started him on coumadin (NOACs not covered by his insurance) and Toprol XL.  In 9/13, I sent him for TEE-guided cardioversion on amiodarone (INR did not stay therapeutic throught the pre-DCCV period so needed TEE).  He converted to NSR.  I did admit him after cardioversion for diuresis.  TEE prior to DCCV confirmed EF 35-40% with severe, post-infarction MR in the setting of severe hypokinesis to akinesis of the inferior and posterior walls. Repeat echo in NSR in 11/13 showed EF 45-50% with severe MR.    Since I last saw him, [name] developed a right supraclavicular solid neck mass that may be lung adenocarcinoma primary, though no obvious primary lung site was noted on imaging.  He is not undergoing radiation treatment (palliative) to the mass.  Main complaint currently is back pain and knee pain.  It is  worse with walking.  He is not short of breath with exertion currently and denies chest pain.  Weight is down 8 lbs since last appointment.    Labs (9/12): LDL 81, HDL 54, K 4.2, creatinine 1.9, BNP 1065 Labs (11/12): K 4.1, creatinine 1.7, BNP 837 Labs (1/13): K 3.7, creatinine 1.6, HDL 52, LDL 100 Labs (5/13): K 4.4, creatinine 1.5, HDL 47, LDL 76 Labs (7/13): creatinine 2.0 Labs (8/13): K 3.9, creatinine 1.9, BNP 1066 Labs (9/13): K 3.5, creatinine 2.4=>2.3, BNP 971, LFTs normal, TSH normal Labs (10/13): K 4.6, creatinine 1.87 Labs (11/13): K 4.5, creatinine 2.4, LDL 73, HDL 50 Labs (5/14): K 4.3, creatinine 1.95, LFTs normal, HCT 33.4  ECG: NSR, IVCD, prolonged QT, old inferior MI  PMH: 1. Paroxysmal atrial fibrillation: Noted only post-op CABG in 10/11.  Was not put on coumadin.  Initially on amiodarone but this was stopped. Back in atrial fibrillation in 8/13, amiodarone restarted along with coumadin.  TEE-guided DCCV in 9/13.   2. Knee osteoarthritis 3. Anemia of uncertain etiology: had erythropoietin shots for a period of time.  4. CAD: MI at age 78.  Had CABG in 10/11 with LIMA-LAD, SVG-D, and SVG-RCA.  Lexiscan myoview (9/13) with EF 34%, large inferior infarct from base to apex with no ischemia.   5. Systolic CHF: Echo (1/12) with EF 50%, inferior and inferoseptal hypokinesis, mild LV hypertrophy, moderate MR, severe left atrial enlargement.  [name] 7[facility] EF 35-40%, severe MR, moderate-severe LAE, PASP 50-60 mmHg.  TEE (9/13): EF 35-40%, posterior and inferior severe HK to AK, severe post-infarction MR, mildly decreased RV systolic function. Echo (11/13) with EF 45-50%, LV dilation, severe MR, mild RV dilation with moderate RV dysfunction.  6. Ulcerative colitis: s/p total colectomy with ileostomy.  7. HTN 8. Hyperlipidemia 9. Nephrolithiasis 10. CKD: H/o ARF/hyperkalemia in the setting of dehydration.  He is off ACEI and spironolactone.  11. MR: Severe on  7/13 echo and 9/13 TEE.  Suspect post-infarction MR in the setting of inferoposterior wall motion abnormality.  12. Gout 13. Right supraclavicular mass: Possible lung adenoCA primary but imaging has not identified an obvious primary source.  He is undergoing palliative radiation.  SH: Retired from [employer].  Married, lives in [place]. Stopped smoking in 1970.    FH: Strong history of CAD.  Father with MI at 62.  All siblings (11) have now passed away, many with heart disease.   ROS: All systems reviewed and negative except as per HPI.   Current Outpatient Prescriptions  Medication Sig Dispense Refill  . acetaminophen (TYLENOL) 650 MG CR tablet Take 650 mg by mouth 2 (two) times daily as needed for pain.       [name] allopurinol (ZYLOPRIM) 100 MG tablet Take 100 mg by mouth every morning.      [name] amiodarone (PACERONE) 200 MG tablet Take 100 mg by mouth every evening.      . carvedilol (COREG) 6.25 MG tablet Take 1 tablet (6.25 mg total) by mouth 2 (two) times daily with a meal.  180 tablet  3  . cholecalciferol (VITAMIN D) 1000 UNITS tablet Take 1,000 Units by mouth daily.      . ferrous sulfate 325 (65 FE) MG tablet Take 325 mg by mouth every evening.      . fish oil-omega-3 fatty acids 1000 MG capsule Take 1 g by mouth 2 (two) times daily.       [name] FLAXSEED, LINSEED, PO Take 1 tablet by mouth at bedtime.       . folic acid (FOLVITE) 400 MCG tablet Take 400 mcg by mouth daily.       . furosemide (LASIX) 40 MG tablet Take 0.5 tablets (20 mg total) by mouth daily.  45 tablet  3  . hydrALAZINE (APRESOLINE) 25 MG tablet Take 25 mg by mouth 3 (three) times daily.      . isosorbide mononitrate (IMDUR) 60 MG 24 hr tablet Take 30 mg by mouth daily.       . Multiple Vitamin (MULITIVITAMIN WITH MINERALS) TABS Take 1 tablet by mouth daily.      . Multiple Vitamins-Minerals (ICAPS AREDS FORMULA PO) Take 1 capsule by mouth every evening.      . nitroGLYCERIN (NITROSTAT) 0.4 MG SL tablet Place 1 tablet (0.4  mg total) under the tongue every 5 (five) minutes as needed for chest pain.  30 tablet  0  . omega-3 acid ethyl esters (LOVAZA) 1 G capsule Take 1 capsule (1 g total) by mouth daily.  30 capsule  0  . potassium chloride SA (K-DUR,KLOR-CON) 20 MEQ tablet Take 1 tablet (20 mEq total) by mouth daily.  90 tablet  3  . Tamsulosin HCl (FLOMAX) 0.4 MG CAPS Take 0.4 mg by mouth every evening.      . warfarin (COUMADIN) 5 MG tablet Take 2.5-5 mg by mouth at bedtime. Take 5mg  on Wednesday and 2.5mg  on all other days      .  atorvastatin (LIPITOR) 40 MG tablet 1/2 tablet daily      . [DISCONTINUED] pantoprazole (PROTONIX) 40 MG tablet Take 40 mg by mouth daily.         No current facility-administered medications for this visit.    BP 112/70  Pulse 65  Ht 5\' 2"  (1.575 m)  Wt 72.757 kg (160 lb 6.4 oz)  BMI 29.33 kg/m2  SpO2 98% General: NAD Neck: JVP not elevated, no thyromegaly or thyroid nodule.  Lungs: Clear to auscultation bilaterally with normal respiratory effort. CV: Nondisplaced PMI.  Heart irregular S1/S2, no S3/S4, 2/6 HSM at apex. 1+ ankle edema.  No carotid bruit.  Abdomen: Soft, nontender, no hepatosplenomegaly, no distention.  Neurologic: Alert and oriented x 3.  Psych: Normal affect. Extremities: No clubbing or cyanosis.  Skin: Solid mass at right supraclavicular area  Assessment/Plan  1. Atrial fibrillation  Patient has been cardioverted to NSR and remains in NSR today on amiodarone.   It is possible that his cardiomyopathy was partly tachycardia-mediated.  - Continue coumadin, Coreg, amiodarone.   - TSH and LFTs today.  He will need yearly eye exam. He has had baseline PFTs with amiodarone use.   2. Chronic systolic CHF  EF 16-10% on most recent echo while in NSR.  Cannot rule out component of tachy-mediated cardiomyopathy but suspect ischemic cardiomyopathy plays major role. He denies chest pain.  NYHA class II.   - He is off spironolactone and ACEI after ARF with hyperkalemia  and given baseline significantly CKD.   - He is now taking hydralazine and Imdur which I will continue at current dose.    - Continue Coreg.  - Continue current Lasix dosing.      - BMET today.  3. HYPERLIPIDEMIA  Continue atorvastatin 40 with known CAD.  Need to check lipids.  4. Mitral regurgitation  Severe on last TEE.  This appeared to be post-infarction MR with inferoposterior severe hypokinesis to akinesis.  We have talked at length about surgery to correct the MR.  He would be a difficult candidate for MVR given prior sternotomy and significant CKD.   He does not want to consider redo heart surgery.  I think this is reasonable given the risk and will try to manage him as well as we can medically.  5. CAD  Status post CABG.  Lexiscan myoview showed dense inferior scar with no ischemia.  Continue medical management.  No ASA with stable CAD on warfarin.  6. CKD (chronic kidney disease) BMET.  Will need to follow creatinine closely.  It has been stable.   [name] 07/22/2013

## 2013-07-22 NOTE — Progress Notes (Signed)
[facility]    Radiation Oncology [address] [place]     Maryln Gottron, M.D. [place], [place] [address]               Billie Lade, M.D., Ph.D. Phone: [phone]      Molli Hazard A. Kathrynn Running, M.D. Fax: [phone]      Radene Gunning, M.D., Ph.D.         Lurline Hare, M.D.         Grayland Jack, M.D Weekly Treatment Management Note  Name: [name] [name]     MRN: [id number]        CSN: [id number] Date: 07/22/2013      DOB: 04-28-1930  CC: [name] Gasser, MD         [place]    Status: Outpatient  Diagnosis: The encounter diagnosis was Metastatic adenocarcinoma.  Current Dose: 5 Gy   Current Fraction: 2  Planned Dose: 37.5 Gy  Narrative: [name] [name] was seen today for weekly treatment management. The chart was checked and cone beam CT's  were reviewed.  He is tolerating the treatments well at this time without any side effects.  Review of patient's allergies indicates no known allergies. Current Outpatient Prescriptions  Medication Sig Dispense Refill  . acetaminophen (TYLENOL) 650 MG CR tablet Take 650 mg by mouth 2 (two) times daily as needed for pain.       [name] allopurinol (ZYLOPRIM) 100 MG tablet Take 100 mg by mouth every morning.      [name] amiodarone (PACERONE) 200 MG tablet Take 100 mg by mouth every evening.      [name] atorvastatin (LIPITOR) 40 MG tablet 1/2 tablet daily      . carvedilol (COREG) 6.25 MG tablet Take 1 tablet (6.25 mg total) by mouth 2 (two) times daily with a meal.  180 tablet  3  . cholecalciferol (VITAMIN D) 1000 UNITS tablet Take 1,000 Units by mouth daily.      . ferrous sulfate 325 (65 FE) MG tablet Take 325 mg by mouth every evening.      . fish oil-omega-3 fatty acids 1000 MG capsule Take 1 g by mouth 2 (two) times daily.       [name] FLAXSEED, LINSEED, PO Take 1 tablet by mouth at bedtime.       . folic acid (FOLVITE) 400 MCG tablet Take 400 mcg by mouth daily.       . furosemide (LASIX) 40 MG tablet Take 0.5 tablets (20 mg total) by  mouth daily.  45 tablet  3  . hydrALAZINE (APRESOLINE) 25 MG tablet Take 25 mg by mouth 3 (three) times daily.      . isosorbide mononitrate (IMDUR) 60 MG 24 hr tablet Take 30 mg by mouth daily.       . Multiple Vitamin (MULITIVITAMIN WITH MINERALS) TABS Take 1 tablet by mouth daily.      . Multiple Vitamins-Minerals (ICAPS AREDS FORMULA PO) Take 1 capsule by mouth every evening.      . nitroGLYCERIN (NITROSTAT) 0.4 MG SL tablet Place 1 tablet (0.4 mg total) under the tongue every 5 (five) minutes as needed for chest pain.  30 tablet  0  . omega-3 acid ethyl esters (LOVAZA) 1 G capsule Take 1 capsule (1 g total) by mouth daily.  30 capsule  0  . potassium chloride SA (K-DUR,KLOR-CON) 20 MEQ tablet Take 1 tablet (20 mEq total) by mouth daily.  90 tablet  3  . Tamsulosin HCl (FLOMAX) 0.4 MG  CAPS Take 0.4 mg by mouth every evening.      . warfarin (COUMADIN) 5 MG tablet Take 2.5-5 mg by mouth at bedtime. Take 5mg  on Wednesday and 2.5mg  on all other days      . [DISCONTINUED] pantoprazole (PROTONIX) 40 MG tablet Take 40 mg by mouth daily.         No current facility-administered medications for this encounter.   Labs:  Lab Results  Component Value Date   WBC 9.1 05/29/2013   HGB 10.0* 05/29/2013   HCT 30.8* 05/29/2013   MCV 83.7 05/29/2013   PLT 187 05/29/2013   Lab Results  Component Value Date   CREATININE 1.8* 07/21/2013   BUN 24* 07/21/2013   NA 135 07/21/2013   K 4.6 07/21/2013   CL 106 07/21/2013   CO2 24 07/21/2013   Lab Results  Component Value Date   ALT 32 07/21/2013   AST 38* 07/21/2013   PHOS 3.9 03/20/2010   BILITOT 0.4 07/21/2013    Physical Examination:  weight is 160 lb 9.6 oz (72.848 kg). His oral temperature is 97.5 F (36.4 C). His blood pressure is 106/47 and his pulse is 58. His respiration is 20 and oxygen saturation is 100%.    Wt Readings from Last 3 Encounters:  07/22/13 160 lb 9.6 oz (72.848 kg)  07/21/13 160 lb 6.4 oz (72.757 kg)  07/04/13 162 lb (73.483 kg)    The  large right supraclavicular mass is unchanged on exam today. Lungs - Normal respiratory effort, chest expands symmetrically. Lungs are clear to auscultation, no crackles or wheezes.  Heart has regular rhythm and rate  Abdomen is soft and non tender with normal bowel sounds  Assessment:  Patient tolerating treatments well  Plan: Continue treatment per original radiation prescription

## 2013-07-22 NOTE — Progress Notes (Signed)
Pt states he has chronic arthritic knee pain. He denies fatigue, loss of appetite.  Post sim ed completed w/pt. Gave pt "Radiation and You" booklet w/all pertinent information marked and discussed, re: fatigue, skin irritation/care, throat irritation/care, nutrition, pain.  All questions answered.

## 2013-07-23 ENCOUNTER — Telehealth: Payer: Self-pay | Admitting: Family Medicine

## 2013-07-23 ENCOUNTER — Ambulatory Visit
Admission: RE | Admit: 2013-07-23 | Discharge: 2013-07-23 | Disposition: A | Discharge status: Home / Self Care | Payer: Medicare Other | Source: Ambulatory Visit | Attending: Radiation Oncology | Admitting: Radiation Oncology

## 2013-07-23 MED ORDER — WARFARIN SODIUM 5 MG PO TABS: 2.5000 mg | ORAL_TABLET | Freq: Every day | ORAL | Status: DC

## 2013-07-23 NOTE — Telephone Encounter (Signed)
Patient walk-in request to know if he can have a hand written prescription for coumadin and if you can call him and he will pick it up. Thanks

## 2013-07-23 NOTE — Telephone Encounter (Signed)
rx printed.Heath Gold Informed pt's wife about rx.[name], [name]

## 2013-07-24 ENCOUNTER — Ambulatory Visit
Admission: RE | Admit: 2013-07-24 | Discharge: 2013-07-24 | Disposition: A | Discharge status: Home / Self Care | Payer: Medicare Other | Source: Ambulatory Visit | Attending: Radiation Oncology | Admitting: Radiation Oncology

## 2013-07-25 ENCOUNTER — Ambulatory Visit
Admission: RE | Admit: 2013-07-25 | Discharge: 2013-07-25 | Disposition: A | Discharge status: Home / Self Care | Payer: Medicare Other | Source: Ambulatory Visit | Attending: Radiation Oncology | Admitting: Radiation Oncology

## 2013-07-28 ENCOUNTER — Ambulatory Visit
Admission: RE | Admit: 2013-07-28 | Discharge: 2013-07-28 | Disposition: A | Discharge status: Home / Self Care | Payer: Medicare Other | Source: Ambulatory Visit | Attending: Radiation Oncology | Admitting: Radiation Oncology

## 2013-07-29 ENCOUNTER — Ambulatory Visit
Admission: RE | Admit: 2013-07-29 | Discharge: 2013-07-29 | Disposition: A | Discharge status: Home / Self Care | Payer: Medicare Other | Source: Ambulatory Visit | Attending: Radiation Oncology | Admitting: Radiation Oncology

## 2013-07-29 ENCOUNTER — Encounter: Payer: Self-pay | Admitting: *Deleted

## 2013-07-29 ENCOUNTER — Ambulatory Visit (INDEPENDENT_AMBULATORY_CARE_PROVIDER_SITE_OTHER): Payer: Medicare Other | Admitting: Family Medicine

## 2013-07-29 ENCOUNTER — Encounter: Payer: Self-pay | Admitting: Radiation Oncology

## 2013-07-29 VITALS — BP 125/78 | HR 71 | Temp 98.1°F | Resp 20 | Wt 156.8 lb

## 2013-07-29 VITALS — BP 115/70 | HR 70 | Wt 157.0 lb

## 2013-07-29 DIAGNOSIS — Z7901 Long term (current) use of anticoagulants: Secondary | ICD-10-CM

## 2013-07-29 DIAGNOSIS — I4891 Unspecified atrial fibrillation: Secondary | ICD-10-CM

## 2013-07-29 DIAGNOSIS — C801 Malignant (primary) neoplasm, unspecified: Secondary | ICD-10-CM

## 2013-07-29 LAB — POCT INR: INR: 1.9

## 2013-07-29 NOTE — Progress Notes (Addendum)
Weekly rad txs, Rt subclavian  7/15 txs lung completed , no coughing, no sob, 97% room air, vitals wnl, no c/o pain, right neck swollen, slight erythema, no c/o itching, fair appetite, no difficulty swallowing or chewing,patient requesting MD go over his last Pet scan again,printed results 11:22 AM

## 2013-07-29 NOTE — Progress Notes (Signed)
Weekly Management Note Current Dose:17.5 Gy  Projected Dose:37.5 Gy   Narrative:  The patient presents for routine under treatment assessment.  CBCT/MVCT images/Port film x-rays were reviewed.  The chart was checked. Doing well. No dysphagia. Has not noticed a change in size of his mass. No skin changes. REquested to see his PET scan again. Per pt was EGFR negative so will not be receiving pills but standard chemo. Has appt on 8/11 with med onc.   Physical Findings:  Unchanged. Large right supraclavicular mass.   Vitals:  Filed Vitals:   07/29/13 1123  BP: 125/78  Pulse: 71  Temp: 98.1 F (36.7 C)  Resp: 20   Weight:  Wt Readings from Last 3 Encounters:  07/29/13 156 lb 12.8 oz (71.124 kg)  07/22/13 160 lb 9.6 oz (72.848 kg)  07/21/13 160 lb 6.4 oz (72.757 kg)   Lab Results  Component Value Date   WBC 9.1 05/29/2013   HGB 10.0* 05/29/2013   HCT 30.8* 05/29/2013   MCV 83.7 05/29/2013   PLT 187 05/29/2013   Lab Results  Component Value Date   CREATININE 1.8* 07/21/2013   BUN 24* 07/21/2013   NA 135 07/21/2013   K 4.6 07/21/2013   CL 106 07/21/2013   CO2 24 07/21/2013   PET scan reviewed with patient and wife  Impression:  The patient is tolerating radiation.  Plan:  Continue treatment as planned. Gave copy of PET scan report.

## 2013-07-29 NOTE — Progress Notes (Signed)
  Subjective:    Patient ID: [name] [name], male    DOB: February 06, 1930, 79 y.o.   MRN: [id number] Pt notified of new dosage instructions & to recheck in 2 weeks. Donne Anon, CMA HPI    Review of Systems     Objective:   Physical Exam        Assessment & Plan:

## 2013-07-30 ENCOUNTER — Ambulatory Visit
Admission: RE | Admit: 2013-07-30 | Discharge: 2013-07-30 | Disposition: A | Discharge status: Home / Self Care | Payer: Medicare Other | Source: Ambulatory Visit | Attending: Radiation Oncology | Admitting: Radiation Oncology

## 2013-07-31 ENCOUNTER — Ambulatory Visit
Admission: RE | Admit: 2013-07-31 | Discharge: 2013-07-31 | Disposition: A | Discharge status: Home / Self Care | Payer: Medicare Other | Source: Ambulatory Visit | Attending: Radiation Oncology | Admitting: Radiation Oncology

## 2013-08-01 ENCOUNTER — Ambulatory Visit
Admission: RE | Admit: 2013-08-01 | Discharge: 2013-08-01 | Disposition: A | Discharge status: Home / Self Care | Payer: Medicare Other | Source: Ambulatory Visit | Attending: Radiation Oncology | Admitting: Radiation Oncology

## 2013-08-04 ENCOUNTER — Other Ambulatory Visit (HOSPITAL_BASED_OUTPATIENT_CLINIC_OR_DEPARTMENT_OTHER): Payer: Medicare Other | Admitting: Lab

## 2013-08-04 ENCOUNTER — Telehealth: Payer: Self-pay | Admitting: Hematology & Oncology

## 2013-08-04 ENCOUNTER — Ambulatory Visit
Admission: RE | Admit: 2013-08-04 | Discharge: 2013-08-04 | Disposition: A | Discharge status: Home / Self Care | Payer: Medicare Other | Source: Ambulatory Visit | Attending: Radiation Oncology | Admitting: Radiation Oncology

## 2013-08-04 ENCOUNTER — Ambulatory Visit (HOSPITAL_BASED_OUTPATIENT_CLINIC_OR_DEPARTMENT_OTHER): Payer: Medicare Other | Admitting: Hematology & Oncology

## 2013-08-04 VITALS — BP 100/54 | HR 64 | Temp 97.7°F | Resp 16 | Ht 62.0 in | Wt 156.0 lb

## 2013-08-04 DIAGNOSIS — C3491 Malignant neoplasm of unspecified part of right bronchus or lung: Secondary | ICD-10-CM

## 2013-08-04 DIAGNOSIS — C3490 Malignant neoplasm of unspecified part of unspecified bronchus or lung: Secondary | ICD-10-CM

## 2013-08-04 DIAGNOSIS — D630 Anemia in neoplastic disease: Secondary | ICD-10-CM

## 2013-08-04 DIAGNOSIS — C799 Secondary malignant neoplasm of unspecified site: Secondary | ICD-10-CM

## 2013-08-04 DIAGNOSIS — C50919 Malignant neoplasm of unspecified site of unspecified female breast: Secondary | ICD-10-CM

## 2013-08-04 LAB — CBC WITH DIFFERENTIAL (CANCER CENTER ONLY)
BASO#: 0 10*3/uL (ref 0.0–0.2)
BASO%: 0.4 % (ref 0.0–2.0)
EOS%: 1.2 % (ref 0.0–7.0)
HCT: 27.8 % — ABNORMAL LOW (ref 38.7–49.9)
HGB: 8.7 g/dL — ABNORMAL LOW (ref 13.0–17.1)
LYMPH#: 0.5 10*3/uL — ABNORMAL LOW (ref 0.9–3.3)
LYMPH%: 5.8 % — ABNORMAL LOW (ref 14.0–48.0)
MCH: 28.2 pg (ref 28.0–33.4)
MCHC: 31.3 g/dL — ABNORMAL LOW (ref 32.0–35.9)
MCV: 90 fL (ref 82–98)
MONO%: 11.4 % (ref 0.0–13.0)
NEUT%: 81.2 % — ABNORMAL HIGH (ref 40.0–80.0)
RDW: 15.4 % (ref 11.1–15.7)

## 2013-08-04 LAB — CMP (CANCER CENTER ONLY)
ALT(SGPT): 24 U/L (ref 10–47)
AST: 31 U/L (ref 11–38)
BUN, Bld: 26 mg/dL — ABNORMAL HIGH (ref 7–22)
Calcium: 9.6 mg/dL (ref 8.0–10.3)
Chloride: 105 mEq/L (ref 98–108)
Creat: 2 mg/dl — ABNORMAL HIGH (ref 0.6–1.2)
Total Bilirubin: 0.7 mg/dl (ref 0.20–1.60)

## 2013-08-04 NOTE — Progress Notes (Signed)
This office note has been dictated.

## 2013-08-05 ENCOUNTER — Ambulatory Visit
Admission: RE | Admit: 2013-08-05 | Discharge: 2013-08-05 | Disposition: A | Discharge status: Home / Self Care | Payer: Medicare Other | Source: Ambulatory Visit | Attending: Radiation Oncology | Admitting: Radiation Oncology

## 2013-08-05 ENCOUNTER — Encounter: Payer: Self-pay | Admitting: Radiation Oncology

## 2013-08-05 VITALS — BP 97/51 | HR 63 | Temp 97.9°F | Resp 20 | Wt 157.1 lb

## 2013-08-05 DIAGNOSIS — C799 Secondary malignant neoplasm of unspecified site: Secondary | ICD-10-CM

## 2013-08-05 MED ORDER — RADIAPLEXRX EX GEL
Freq: Once | CUTANEOUS | Status: AC
  Administered 2013-08-05: 12:00:00 via TOPICAL

## 2013-08-05 NOTE — Progress Notes (Signed)
CC:   [name] [name], M.D.  DIAGNOSIS:  Metastatic adenocarcinoma of the lung.  CURRENT THERAPY:  Patient receiving palliative radiation therapy to the right supraclavicular region.  INTERIM HISTORY:  [name] [name] comes in for followup.  He is doing okay with the radiation.  He has felt that he has noticed some shrinkage of the tumor in his right neck.  I spoke with Dr. Roselind Messier. Dr. Roselind Messier I think has another week or so more planned for his treatment.  [name] [name] does have metastatic adenocarcinoma of the lung.  Again, we found this with genetic profiling.  His tumor has a 90% probability for lung cancer.  We have been trying to get [employer] __________ to run the genetic markers.  Today, they have all been negative.  I notice that [name] [name] is a little bit more pale.  I am not sure as to why this would be.  He does have some chronic renal insufficiency so it is possible that he may be erythropoietin deficient.  He has had no obvious bleeding.  There has been no change in bowel or bladder habits.  His appetite has been okay.  Overall, his performance status is ECOG 1.  PHYSICAL EXAMINATION:  General:  This is an elderly white gentleman in no obvious distress.  Vital signs:  Temperature of 97.7, pulse 64, respiratory rate 18, blood pressure 100/54.  Weight is 156.  Head and neck:  Normocephalic, atraumatic skull.  He does have the right supraclavicular mass.  It appears to be not as extensive.  There is a little mobility to this.  It is nontender to palpation.  Lungs:  Clear bilaterally.  Cardiac:  Regular rate and rhythm with a normal S1, S2. There are no murmurs, rubs or bruits.  Abdomen:  Soft.  He has good bowel sounds.  There is no fluid wave.  There is no palpable hepatosplenomegaly.  Extremities:  Show no clubbing, cyanosis or edema. Neurological:  Shows no focal neurological deficits.  LABORATORY STUDIES:  White cell count is 8.2, hemoglobin 8.7, hematocrit 27.8, platelet  count is 169. BUN 26, creatinine 2.0.  Albumin 2.2 with total protein 6.9.  Calcium is 9.6.  IMPRESSION:  [name] [name] is a nice 79 year old gentleman with metastatic adenocarcinoma of the lung.  So far, we have not found any mutated genes.  I will continue to check.  I will have to see if the pathologist here can run some of the biopsies.  I think that chemotherapy would likely be our best option for him.  I realize that he is 79 years old.  We are going to have to be careful if we do treat him with chemotherapy.  He probably could tolerate combination chemotherapy, but we may have to dose reduce a little bit.  I am not sure as to why he is anemic.  We are going to have to watch out for this.  I would not think that he has marrow involvement, at least by his PET scan that he had done.  He is complaining of low back issues. Again, the PET scan does not show any evidence of metastasis there.  His daughter came in with him today.  His wife always comes with him.  I answered all of their questions.  We need to get him through radiation first.  I want to make sure that we focus on palliation of symptoms, and radiation I thought was the best way to go to help with this.  I think if  we do go with systemic chemotherapy, Alimta will be in the "mix."  Whether or not we can actually give carboplatin with this might be another story.  We will again, see [name] [name] back in another month.    ______________________________ Josph Macho, M.D. PRE/MEDQ  D:  08/04/2013  T:  08/05/2013  Job:  [id number]

## 2013-08-05 NOTE — Progress Notes (Signed)
Pt denies pain, cough, SOB. He is fatigued, has loss of appetite.p  Pt states he has drank Boost in past.  Encouraged pt to drink nutritional supplement for extra calories, protein. Pt reports slight sore throat w/swallowing. Pt requests Dr Roselind Messier show him PET scan images today, also states he is to start chemo 2 weeks after completing radiation treatments and is unsure if he completes this Friday or will have 5 more radiation treatments. Pt will discuss w/Dr Roselind Messier today.  Gave pt 1 month FU card, informed him he may need to see dr on last day of treatment, to be determined by Dr Roselind Messier.

## 2013-08-05 NOTE — Progress Notes (Signed)
[facility]    Radiation Oncology [address] [place]     Maryln Gottron, M.D. [place], [place] [address]               Billie Lade, M.D., Ph.D. Phone: [phone]      Molli Hazard A. Kathrynn Running, M.D. Fax: [phone]      Radene Gunning, M.D., Ph.D.         Lurline Hare, M.D.         Grayland Jack, M.D Weekly Treatment Management Note  Name: [name] [name]     MRN: [id number]        CSN: [id number] Date: 08/05/2013      DOB: 1930-05-30  CC: [name], MD         [place]    Status: Outpatient  Diagnosis: The encounter diagnosis was Metastatic adenocarcinoma.  Current Dose: 30 Gy  Current Fraction: 12 of 15 planned  Planned Dose: 37.5 Gy  Narrative: [name] [name] was seen today for weekly treatment management. The chart was checked and CBCT  were reviewed. He is tolerating his treatments recently well. He has noticed some fatigue. He has noticed slight soreness with swallowing but does not wish to start on any new medications at this time. The patient will begin chemotherapy in approximately 2 weeks.  Review of patient's allergies indicates no known allergies.  Current Outpatient Prescriptions  Medication Sig Dispense Refill  . acetaminophen (TYLENOL) 650 MG CR tablet Take 650 mg by mouth as needed for pain.       [name] allopurinol (ZYLOPRIM) 100 MG tablet Take 100 mg by mouth every morning.      [name] amiodarone (PACERONE) 200 MG tablet Take 100 mg by mouth every evening.      [name] atorvastatin (LIPITOR) 40 MG tablet 1/2 tablet daily      . carvedilol (COREG) 6.25 MG tablet Take 1 tablet (6.25 mg total) by mouth 2 (two) times daily with a meal.  180 tablet  3  . cholecalciferol (VITAMIN D) 1000 UNITS tablet Take 1,000 Units by mouth daily.      . ferrous sulfate 325 (65 FE) MG tablet Take 325 mg by mouth every evening.      . fish oil-omega-3 fatty acids 1000 MG capsule Take 1 g by mouth 2 (two) times daily.       [name] FLAXSEED, LINSEED, PO Take 1 tablet by mouth  at bedtime.       . folic acid (FOLVITE) 400 MCG tablet Take 400 mcg by mouth daily.       . furosemide (LASIX) 40 MG tablet Take 20 mg by mouth 2 (two) times daily.      . isosorbide mononitrate (IMDUR) 60 MG 24 hr tablet Take 30 mg by mouth daily.       . Multiple Vitamin (MULITIVITAMIN WITH MINERALS) TABS Take 1 tablet by mouth daily.      . Multiple Vitamins-Minerals (ICAPS AREDS FORMULA PO) Take 1 capsule by mouth every evening.      . nitroGLYCERIN (NITROSTAT) 0.4 MG SL tablet Place 1 tablet (0.4 mg total) under the tongue every 5 (five) minutes as needed for chest pain.  30 tablet  0  . omega-3 acid ethyl esters (LOVAZA) 1 G capsule Take 1 capsule (1 g total) by mouth daily.  30 capsule  0  . potassium chloride SA (K-DUR,KLOR-CON) 20 MEQ tablet Take 1 tablet (20 mEq total) by mouth daily.  90 tablet  3  . Tamsulosin HCl (  FLOMAX) 0.4 MG CAPS Take 0.4 mg by mouth every evening.      . warfarin (COUMADIN) 5 MG tablet Take 0.5-1 tablets (2.5-5 mg total) by mouth at bedtime. Take 5mg  on Wednesday and 2.5mg  on all other days  30 tablet  1  . hydrALAZINE (APRESOLINE) 25 MG tablet Take 25 mg by mouth 3 (three) times daily.      . [DISCONTINUED] pantoprazole (PROTONIX) 40 MG tablet Take 40 mg by mouth daily.         No current facility-administered medications for this encounter.   Labs:  Lab Results  Component Value Date   WBC 8.2 08/04/2013   HGB 8.7* 08/04/2013   HCT 27.8* 08/04/2013   MCV 90 08/04/2013   PLT 169 08/04/2013   Lab Results  Component Value Date   CREATININE 2.0* 08/04/2013   BUN 26* 08/04/2013   NA 135 08/04/2013   K 3.6 08/04/2013   CL 105 08/04/2013   CO2 26 08/04/2013   Lab Results  Component Value Date   ALT 24 08/04/2013   AST 31 08/04/2013   PHOS 3.9 03/20/2010   BILITOT 0.70 08/04/2013    Physical Examination:  weight is 157 lb 1.6 oz (71.26 kg). His temperature is 97.9 F (36.6 C). His blood pressure is 97/51 and his pulse is 63. His respiration is 20 and oxygen  saturation is 100%.    Wt Readings from Last 3 Encounters:  08/05/13 157 lb 1.6 oz (71.26 kg)  08/04/13 156 lb (70.761 kg)  07/29/13 157 lb (71.215 kg)    The right supraclavicular mass appears softer and smaller on exam today. There is some erythema in the treatment area.   the oral cavity is moist without secondary infection                                                                                            . Lungs are clear to auscultation, no crackles or wheezes.  Heart has regular rhythm and rate  Abdomen is soft and non tender with normal bowel sounds  Assessment:  Patient tolerating treatments well  Plan: Continue treatment per original radiation prescription.  He will be given radiaPlex to place on his skin.

## 2013-08-06 ENCOUNTER — Ambulatory Visit
Admission: RE | Admit: 2013-08-06 | Discharge: 2013-08-06 | Disposition: A | Discharge status: Home / Self Care | Payer: Medicare Other | Source: Ambulatory Visit | Attending: Radiation Oncology | Admitting: Radiation Oncology

## 2013-08-07 ENCOUNTER — Telehealth: Payer: Self-pay | Admitting: Cardiology

## 2013-08-07 ENCOUNTER — Ambulatory Visit
Admission: RE | Admit: 2013-08-07 | Discharge: 2013-08-07 | Disposition: A | Discharge status: Home / Self Care | Payer: Medicare Other | Source: Ambulatory Visit | Attending: Radiation Oncology | Admitting: Radiation Oncology

## 2013-08-07 NOTE — Telephone Encounter (Signed)
New prob  Pt would like a copy of his records from his recent visit and lab work. He did not want to speak with anyone in medical records.

## 2013-08-07 NOTE — Telephone Encounter (Signed)
Spoke with pt, copy of office note and labs placed at the front desk for pt to pick up

## 2013-08-08 ENCOUNTER — Ambulatory Visit
Admission: RE | Admit: 2013-08-08 | Discharge: 2013-08-08 | Disposition: A | Discharge status: Home / Self Care | Payer: Medicare Other | Source: Ambulatory Visit | Attending: Radiation Oncology | Admitting: Radiation Oncology

## 2013-08-11 ENCOUNTER — Telehealth: Payer: Self-pay | Admitting: Hematology & Oncology

## 2013-08-11 NOTE — Telephone Encounter (Signed)
Pt aware of 8-29 appointment

## 2013-08-14 ENCOUNTER — Ambulatory Visit: Payer: Medicare Other | Admitting: *Deleted

## 2013-08-18 ENCOUNTER — Encounter: Payer: Self-pay | Admitting: *Deleted

## 2013-08-18 ENCOUNTER — Ambulatory Visit (INDEPENDENT_AMBULATORY_CARE_PROVIDER_SITE_OTHER): Payer: Medicare Other | Admitting: Family Medicine

## 2013-08-18 VITALS — BP 91/60 | HR 73

## 2013-08-18 DIAGNOSIS — I4891 Unspecified atrial fibrillation: Secondary | ICD-10-CM

## 2013-08-18 DIAGNOSIS — Z7901 Long term (current) use of anticoagulants: Secondary | ICD-10-CM

## 2013-08-19 ENCOUNTER — Telehealth: Payer: Self-pay | Admitting: *Deleted

## 2013-08-19 NOTE — Telephone Encounter (Signed)
Decrease dosage: 1 tablet on Sat. Half tab all other days. Recheck in 2 weeks. Pt informed.Loralee [place]

## 2013-08-22 ENCOUNTER — Other Ambulatory Visit (HOSPITAL_BASED_OUTPATIENT_CLINIC_OR_DEPARTMENT_OTHER): Payer: Medicare Other | Admitting: Lab

## 2013-08-22 ENCOUNTER — Ambulatory Visit (HOSPITAL_BASED_OUTPATIENT_CLINIC_OR_DEPARTMENT_OTHER): Payer: Medicare Other

## 2013-08-22 ENCOUNTER — Other Ambulatory Visit: Payer: Self-pay | Admitting: *Deleted

## 2013-08-22 ENCOUNTER — Ambulatory Visit (HOSPITAL_BASED_OUTPATIENT_CLINIC_OR_DEPARTMENT_OTHER): Payer: Medicare Other | Admitting: Hematology & Oncology

## 2013-08-22 ENCOUNTER — Encounter: Payer: Self-pay | Admitting: *Deleted

## 2013-08-22 VITALS — BP 105/56 | HR 58 | Temp 97.9°F | Resp 18 | Ht 62.0 in | Wt 156.0 lb

## 2013-08-22 DIAGNOSIS — D630 Anemia in neoplastic disease: Secondary | ICD-10-CM

## 2013-08-22 DIAGNOSIS — I5022 Chronic systolic (congestive) heart failure: Secondary | ICD-10-CM

## 2013-08-22 DIAGNOSIS — I4891 Unspecified atrial fibrillation: Secondary | ICD-10-CM

## 2013-08-22 DIAGNOSIS — C801 Malignant (primary) neoplasm, unspecified: Secondary | ICD-10-CM

## 2013-08-22 DIAGNOSIS — Z7901 Long term (current) use of anticoagulants: Secondary | ICD-10-CM

## 2013-08-22 DIAGNOSIS — D649 Anemia, unspecified: Secondary | ICD-10-CM

## 2013-08-22 DIAGNOSIS — C7970 Secondary malignant neoplasm of unspecified adrenal gland: Secondary | ICD-10-CM

## 2013-08-22 DIAGNOSIS — D631 Anemia in chronic kidney disease: Secondary | ICD-10-CM

## 2013-08-22 DIAGNOSIS — C799 Secondary malignant neoplasm of unspecified site: Secondary | ICD-10-CM

## 2013-08-22 LAB — CBC WITH DIFFERENTIAL (CANCER CENTER ONLY)
BASO#: 0 10*3/uL (ref 0.0–0.2)
Eosinophils Absolute: 0.1 10*3/uL (ref 0.0–0.5)
HCT: 28.1 % — ABNORMAL LOW (ref 38.7–49.9)
HGB: 8.8 g/dL — ABNORMAL LOW (ref 13.0–17.1)
LYMPH#: 0.6 10*3/uL — ABNORMAL LOW (ref 0.9–3.3)
MCH: 28.2 pg (ref 28.0–33.4)
MCHC: 31.3 g/dL — ABNORMAL LOW (ref 32.0–35.9)
MONO%: 9.3 % (ref 0.0–13.0)
NEUT#: 5 10*3/uL (ref 1.5–6.5)
NEUT%: 79.6 % (ref 40.0–80.0)
RBC: 3.12 10*6/uL — ABNORMAL LOW (ref 4.20–5.70)

## 2013-08-22 LAB — FERRITIN CHCC: Ferritin: 893 ng/ml — ABNORMAL HIGH (ref 22–316)

## 2013-08-22 LAB — IRON AND TIBC CHCC: Iron: 28 ug/dL — ABNORMAL LOW (ref 42–163)

## 2013-08-22 MED ORDER — ONDANSETRON HCL 8 MG PO TABS: 8.0000 mg | ORAL_TABLET | Freq: Two times a day (BID) | ORAL | Status: DC

## 2013-08-22 MED ORDER — FOLIC ACID 1 MG PO TABS: 1.0000 mg | ORAL_TABLET | Freq: Every day | ORAL | Status: DC

## 2013-08-22 MED ORDER — DEXAMETHASONE 4 MG PO TABS: ORAL_TABLET | ORAL | Status: DC

## 2013-08-22 MED ORDER — PROCHLORPERAZINE MALEATE 10 MG PO TABS: 10.0000 mg | ORAL_TABLET | Freq: Four times a day (QID) | ORAL | Status: DC | PRN

## 2013-08-22 MED ORDER — CYANOCOBALAMIN 1000 MCG/ML IJ SOLN
1000.0000 ug | INTRAMUSCULAR | Status: DC
  Administered 2013-08-22: 1000 ug via INTRAMUSCULAR

## 2013-08-22 NOTE — Telephone Encounter (Signed)
Pt aware IR will call pt with appointment for 9-5. Pt has to stop blood thinners for 4-5 days but has heart condition, so they need clearance from cardiologist, pt aware and RN aware and will contact cardiologist

## 2013-08-22 NOTE — Progress Notes (Signed)
This office note has been dictated.

## 2013-08-23 NOTE — Progress Notes (Signed)
CC:   [name] [name], M.D.  DIAGNOSIS:  Metastatic adenocarcinoma, lung primary most likely.  CURRENT THERAPY:  Patient status post palliative radiation therapy to the right supraclavicular area.  INTERIM HISTORY:  [name] [name] comes in for followup.  He got through his radiation okay.  He has a little bit of erythema on the skin where the radiation was given.  He otherwise tolerated the treatments pretty well. I think he finished a couple of weeks ago.  It.  He received a total of 3750 rads.  He tolerated this well.  There has been some slight shrinkage.  His performance status still is pretty good.  He wants to pursue chemotherapy.  He knows that chemotherapy is not going to cure this by any means.  We are a little bit "hindered" by the fact that we are not totally conclusive about the primary.  I sent off a genetic profile on the tumor.  It came back 90% positive for adenocarcinoma of the lung.  When the pathologist ran the specimen, there was some suggestion that this might be an upper GI tumor.  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  No other abnormalities are appreciated on the PET scan.  Given that this is adenocarcinoma, we will go ahead and treat this with systemic chemotherapy.  We are trying to run the ALK and EGFR.  So far, this has been nondiagnostic.  He is eating okay.  He is having no dysphagia or odynophagia.  He has had no cough or shortness of breath.  He has had no leg swelling.  He has had no problems with bowels or bladder.  Overall, his performance status is ECOG 0.  PHYSICAL EXAMINATION:  General:  This is a well-developed, well- nourished white gentleman in no obvious distress.  Vital signs: Temperature of 97.9, pulse 50, respiratory rate 18, blood pressure 105/56.  Weight is 156.  Head and neck:  Normocephalic, atraumatic skull.  There are no ocular or oral lesions.  He does have the palpable right supraclavicular mass.  This is a little bit less  prominent.  There is erythema about the supraclavicular region on the right secondary to radiation.  It is nontender.  There are no left cervical or supraclavicular lymph nodes.  Lungs:  Clear bilaterally.  Cardiac: Regular rate and rhythm with a normal S1 and S2.  There are no murmurs, rubs or bruits.  Abdomen:  Soft.  He has good bowel sounds.  There is no fluid wave.  There is no palpable hepatosplenomegaly.  Back:  No tenderness over the spine, ribs, or hips.  Extremities:  No clubbing, cyanosis or edema.  Skin:  Some actinic keratoses and seborrheic keratoses.  LABORATORY STUDIES:  White cell count is 6.2, hemoglobin 8.8, hematocrit 28.1, platelet count 135.  IMPRESSION:  [name] [name] is an 79 year old gentleman with metastatic adenocarcinoma.  Again, this is a probable lung primary.  We have not really identified a primary site with any of our scans.  However, genetic profiling seemed to suggest lung cancer.  We will see about carboplatin and Alimta.  I am not too sure we really need to go with Avastin with this.  I just think that if we can try a combination chemo, he should do okay with this.  I will have to make some dosage reductions so that we can account for his renal issues and his age.  There is some moderate anemia.  One has to wonder if this, again, is not from renal insufficiency.  I will try to send off an erythropoietin level on him.  He is on Coumadin.  We will have to watch his INR.  We will need to have a Port-A-Cath put into him.  This will be set up for next week.  I will try to get treatment started on him the week of September 8th.  Again, he knows that what we are going to do will not cure him but if we can see a response, then we will certainly help his quality of life.  I told him that the chance of responding to treatment probably will be about 25%.  I will plan to see him back about a week after his chemo so that we can see how is doing and  monitor his blood counts.  I spent about 45 minutes with him and his wife.    ______________________________ [name] [name], M.D. PRE/MEDQ  D:  08/22/2013  T:  08/23/2013  Job:  [id number]

## 2013-08-26 ENCOUNTER — Encounter: Payer: Self-pay | Admitting: Radiation Oncology

## 2013-08-26 ENCOUNTER — Telehealth: Payer: Self-pay | Admitting: Hematology & Oncology

## 2013-08-26 LAB — COMPREHENSIVE METABOLIC PANEL
BUN: 25 mg/dL — ABNORMAL HIGH (ref 6–23)
CO2: 23 mEq/L (ref 19–32)
Calcium: 8.7 mg/dL (ref 8.4–10.5)
Chloride: 107 mEq/L (ref 96–112)
Creatinine, Ser: 1.85 mg/dL — ABNORMAL HIGH (ref 0.50–1.35)
Glucose, Bld: 88 mg/dL (ref 70–99)
Total Bilirubin: 0.4 mg/dL (ref 0.3–1.2)

## 2013-08-26 LAB — RETICULOCYTES (CHCC)
ABS Retic: 48.8 10*3/uL (ref 19.0–186.0)
Retic Ct Pct: 1.5 % (ref 0.4–2.3)

## 2013-08-26 LAB — LACTATE DEHYDROGENASE: LDH: 219 U/L (ref 94–250)

## 2013-08-26 LAB — ERYTHROPOIETIN: Erythropoietin: 19.3 m[IU]/mL — ABNORMAL HIGH (ref 2.6–18.5)

## 2013-08-26 NOTE — Telephone Encounter (Signed)
Jennifer from IR called to see if pt has be cleared for port. I transferred message to Amy. Amy has not talked to cardiologist. Alvino Chapel RN aware

## 2013-08-26 NOTE — Progress Notes (Signed)
  Radiation Oncology         [phone] ________________________________  Name: Dev Dhondt MRN: [id number]  Date: 08/26/2013  DOB: 11-17-1930  End of Treatment Note  Diagnosis:    Probable stage IV adenocarcinoma of the lung     Indication for treatment:  Large mass in the right supraclavicular region       Radiation treatment dates:   July 28 through August 15  Site/dose:   Right supraclavicular mass and upper mediastinum 37.5 gray in 15 fractions  Beams/energy:   Right anterior oblique/left posterior oblique, 6 and 15 MV photons  Narrative: The patient tolerated radiation treatment relatively well.   He had some mild skin irritation and mild fatigue with his treatments. His tumor mass decreased somewhat during his therapy.  Plan: The patient has completed radiation treatment. The patient will return to radiation oncology clinic for routine followup in one month. I advised them to call or return sooner if they have any questions or concerns related to their recovery or treatment.  -----------------------------------  Billie Lade, PhD, MD

## 2013-08-26 NOTE — Telephone Encounter (Signed)
Pt called today to have a copy of his blood work sent via mail to his home.

## 2013-08-26 NOTE — Patient Instructions (Signed)
NPO after midnight Last dose of coumadin 08-25-13 Have a driver available Arrive at [facility] for INR check at 10:30 Check in at Radiology at 11:30

## 2013-08-27 ENCOUNTER — Encounter: Payer: Self-pay | Admitting: *Deleted

## 2013-08-27 ENCOUNTER — Other Ambulatory Visit: Payer: Self-pay | Admitting: Radiology

## 2013-08-27 ENCOUNTER — Telehealth: Payer: Self-pay | Admitting: Hematology & Oncology

## 2013-08-27 ENCOUNTER — Encounter (HOSPITAL_COMMUNITY): Payer: Self-pay | Admitting: Pharmacy Technician

## 2013-08-27 DIAGNOSIS — Z7901 Long term (current) use of anticoagulants: Secondary | ICD-10-CM

## 2013-08-27 DIAGNOSIS — C799 Secondary malignant neoplasm of unspecified site: Secondary | ICD-10-CM

## 2013-08-27 DIAGNOSIS — I4891 Unspecified atrial fibrillation: Secondary | ICD-10-CM

## 2013-08-27 NOTE — Progress Notes (Signed)
Pt due for port placement on 08/29/13. Is on long-term anticoagulation for a-fib. Dr Myna Hidalgo gave instructions for the pt to hold his Coumadin starting yesterday 08/26/13 (which he did). Spoke multiple times with Victorino Dike, RN in IR regarding the potential of not being able to place his port if his INR is >1.5 on Friday. Dr Fredia Sorrow is concerned because the last INR checked on 8/25 was 4. The [facility] (managed through Dr Linford Arnold) adjusted his dose to accommodate for this. Will have the pt scheduled to have his INR checked at [facility] on Friday at 1030 prior his planned port placement. All parties, pt included, understand that if his INR is above 1.5 he will not be able to have his port placed and will need to be done on Mon or Tues instead.

## 2013-08-27 NOTE — Telephone Encounter (Signed)
Received in basket from RN with 9-5 lab order, I left her message to let me know if I need to call pt I couldn't schedule 1030 am, instead it is at 1045 am

## 2013-08-28 ENCOUNTER — Encounter: Payer: Self-pay | Admitting: *Deleted

## 2013-08-28 ENCOUNTER — Other Ambulatory Visit: Payer: Medicare Other

## 2013-08-29 ENCOUNTER — Other Ambulatory Visit: Payer: Self-pay | Admitting: Hematology & Oncology

## 2013-08-29 ENCOUNTER — Ambulatory Visit (HOSPITAL_COMMUNITY)
Admission: RE | Admit: 2013-08-29 | Discharge: 2013-08-29 | Disposition: A | Discharge status: Home / Self Care | Payer: Medicare Other | Source: Ambulatory Visit | Attending: Hematology & Oncology | Admitting: Hematology & Oncology

## 2013-08-29 ENCOUNTER — Encounter (HOSPITAL_COMMUNITY): Payer: Self-pay

## 2013-08-29 ENCOUNTER — Other Ambulatory Visit (HOSPITAL_BASED_OUTPATIENT_CLINIC_OR_DEPARTMENT_OTHER): Payer: Medicare Other

## 2013-08-29 ENCOUNTER — Inpatient Hospital Stay (HOSPITAL_COMMUNITY): Admission: RE | Admit: 2013-08-29 | Payer: Medicare Other | Source: Ambulatory Visit

## 2013-08-29 DIAGNOSIS — Z7901 Long term (current) use of anticoagulants: Secondary | ICD-10-CM

## 2013-08-29 DIAGNOSIS — Z923 Personal history of irradiation: Secondary | ICD-10-CM | POA: Insufficient documentation

## 2013-08-29 DIAGNOSIS — C801 Malignant (primary) neoplasm, unspecified: Secondary | ICD-10-CM | POA: Insufficient documentation

## 2013-08-29 DIAGNOSIS — I4891 Unspecified atrial fibrillation: Secondary | ICD-10-CM

## 2013-08-29 DIAGNOSIS — C799 Secondary malignant neoplasm of unspecified site: Secondary | ICD-10-CM

## 2013-08-29 LAB — CBC
HCT: 28.8 % — ABNORMAL LOW (ref 39.0–52.0)
Hemoglobin: 9.1 g/dL — ABNORMAL LOW (ref 13.0–17.0)
MCH: 27.8 pg (ref 26.0–34.0)
MCV: 88.1 fL (ref 78.0–100.0)
RBC: 3.27 MIL/uL — ABNORMAL LOW (ref 4.22–5.81)

## 2013-08-29 LAB — PROTIME-INR

## 2013-08-29 MED ORDER — FENTANYL CITRATE 0.05 MG/ML IJ SOLN
INTRAMUSCULAR | Status: AC | PRN
  Administered 2013-08-29 (×2): 50 ug via INTRAVENOUS

## 2013-08-29 MED ORDER — LIDOCAINE HCL 1 % IJ SOLN
INTRAMUSCULAR | Status: AC
  Filled 2013-08-29: qty 20

## 2013-08-29 MED ORDER — FENTANYL CITRATE 0.05 MG/ML IJ SOLN
INTRAMUSCULAR | Status: AC
  Filled 2013-08-29: qty 2

## 2013-08-29 MED ORDER — MIDAZOLAM HCL 2 MG/2ML IJ SOLN
INTRAMUSCULAR | Status: AC
  Filled 2013-08-29: qty 2

## 2013-08-29 MED ORDER — SODIUM CHLORIDE 0.9 % IV SOLN
INTRAVENOUS | Status: DC
  Administered 2013-08-29: 12:00:00 via INTRAVENOUS

## 2013-08-29 MED ORDER — MIDAZOLAM HCL 2 MG/2ML IJ SOLN
INTRAMUSCULAR | Status: AC | PRN
  Administered 2013-08-29 (×2): 1 mg via INTRAVENOUS

## 2013-08-29 MED ORDER — CEFAZOLIN SODIUM-DEXTROSE 2-3 GM-% IV SOLR
2.0000 g | INTRAVENOUS | Status: AC
  Administered 2013-08-29: 2 g via INTRAVENOUS

## 2013-08-29 NOTE — Procedures (Signed)
Procedure:  Porta-cath Access:  Left IJ vein Findings:  Catheter tip at cavoatrial junction.  No PTX.  OK to use.

## 2013-08-29 NOTE — H&P (Signed)
Agree 

## 2013-08-29 NOTE — H&P (Signed)
Chief Complaint: "I am here for a port catheter placement." Referring Physician: Dr. Myna Hidalgo HPI: [name] [name] is an 79 y.o. male who has seen Dr. Myna Hidalgo in the office 08/23/13 for metastatic adenocarcinoma. The patient has had recent palliative radiation to the right supraclavicular region, he denies any known upcoming radiation treatment. He is scheduled to undergo chemotherapy soon and is here today for a port-a-catheter placement. He is on chronic coumadin for PAF, which has been held. INR today 1.39 he denies any blood in his stool or urine. He denies any recent fever or chills. He denies any chest pain or shortness of breath.   Past Medical History:  Past Medical History  Diagnosis Date  . Coronary artery disease   . MI, old     AGE 35  . Ulcerative colitis   . Hyperlipidemia   . Hypertension   . History of nephrolithiasis   . PAF (paroxysmal atrial fibrillation)   . LV dysfunction   . Myocardial infarction   . Anemia   . Blood transfusion   . Arthritis   . CHF (congestive heart failure)   . Mitral regurgitation   . Pneumonia     years ago  . Peripheral vascular disease   . 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 Acute renal failure     "related ? Kidney stone blockage"  . Gout flare     right index finger-tx. prednisone, allopurinol  . Swelling 05-08-13    rt. base of neck-something recent.  . Cancer 05/29/13    metastatic adenocarcinoma of right neck mass  . Metastatic adenocarcinoma 05/29/13    right supraclavicular area    Past Surgical History:  Past Surgical History  Procedure Laterality Date  . Cardiac catheterization  10/14/2010    EF 30%  . Coronary artery bypass graft  10/24/2010    LIMA TO THE LAD, A SAPHENOUS VEIN GRAFT TO THE DIAGONAL 1 AND 3, AND SAPHENOUS VEIN GRAFT TO THE DISTAL RIGHT CORONARY ARTERY  . Ileostomy  04/23/73  . Colectomy    . Cataract extraction    . Transthoracic echocardiogram  01/16/2011    EF 50%  . Cardiovascular stress  test  10/07/2010    EF 33%  . Appendectomy    . Cystostomy  04/22/2012    Procedure: CYSTOSTOMY SUPRAPUBIC;  Surgeon: Garnett Farm, MD;  Location: [facility] ORS;  Service: Urology;  Laterality: N/A;  CYSTOTOMY WITH MIDLINE INCISION   . Tee without cardioversion  08/28/2012    Procedure: TRANSESOPHAGEAL ECHOCARDIOGRAM (TEE);  Surgeon: [name], MD;  Location: [facility] ENDOSCOPY;  Service: Cardiovascular;  Laterality: N/A;  . Cardioversion  08/28/2012    Procedure: CARDIOVERSION;  Surgeon: [name], MD;  Location: [facility] ENDOSCOPY;  Service: Cardiovascular;  Laterality: N/A;  . Cystoscopy w/ ureteral stent placement Right 05/12/2013    Procedure: CYSTOSCOPY WITH RIGHT RETROGRADE PYELOGRAM/ RIGHT URETERAL STENT PLACEMENT;  Surgeon: Garnett Farm, MD;  Location: [facility] ORS;  Service: Urology;  Laterality: Right;    Family History:  Family History  Problem Relation Age of Onset  . Colon cancer Mother     colon  . Heart attack Father   . Hypertension Father   . Heart disease Sister   . Hypertension Sister   . Heart disease Brother   . Hypertension Brother   . Heart disease Sister   . Hypertension Sister   . Heart disease Sister   . Hypertension Sister   . Heart  disease Brother   . Hypertension Brother   . Heart disease Brother   . Hypertension Brother   . Heart disease Brother   . Hypertension Brother   . Heart disease Brother   . Hypertension Brother   . Heart disease Brother   . Hypertension Brother   . Heart disease Brother   . Hypertension Brother   . Heart disease Brother   . Hypertension Brother   . Stroke Brother     Social History:  reports that he quit smoking about 44 years ago. His smoking use included Cigarettes and Cigars. He has a 15 pack-year smoking history. He does not have any smokeless tobacco history on file. He reports that he does not drink alcohol or use illicit drugs.  Allergies: No Known Allergies    Medication List    ASK your doctor about these  medications       allopurinol 100 MG tablet  Commonly known as:  ZYLOPRIM  Take 100 mg by mouth every evening.     amiodarone 200 MG tablet  Commonly known as:  PACERONE  Take 100 mg by mouth every evening.     atorvastatin 40 MG tablet  Commonly known as:  LIPITOR  Take 20 mg by mouth every evening. 1/2 tablet daily     carvedilol 6.25 MG tablet  Commonly known as:  COREG  Take 3.125 mg by mouth 2 (two) times daily with a meal.     cholecalciferol 1000 UNITS tablet  Commonly known as:  VITAMIN D  Take 1,000 Units by mouth daily.     ferrous sulfate 325 (65 FE) MG tablet  Take 325 mg by mouth every evening.     fish oil-omega-3 fatty acids 1000 MG capsule  Take 1 g by mouth 2 (two) times daily.     FLAXSEED (LINSEED) PO  Take 1 tablet by mouth at bedtime.     folic acid 400 MCG tablet  Commonly known as:  FOLVITE  Take 400 mcg by mouth daily.     furosemide 40 MG tablet  Commonly known as:  LASIX  Take 20 mg by mouth 2 (two) times daily.     hydrALAZINE 25 MG tablet  Commonly known as:  APRESOLINE  Take 12.5 mg by mouth 2 (two) times daily.     ICAPS AREDS FORMULA PO  Take 1 capsule by mouth every evening.     isosorbide mononitrate 60 MG 24 hr tablet  Commonly known as:  IMDUR  Take 30 mg by mouth every evening.     nitroGLYCERIN 0.4 MG SL tablet  Commonly known as:  NITROSTAT  Place 1 tablet (0.4 mg total) under the tongue every 5 (five) minutes as needed for chest pain.     potassium chloride SA 20 MEQ tablet  Commonly known as:  K-DUR,KLOR-CON  Take 10 mEq by mouth daily.     tamsulosin 0.4 MG Caps capsule  Commonly known as:  FLOMAX  Take 0.4 mg by mouth every other day.     warfarin 5 MG tablet  Commonly known as:  COUMADIN  Take 2.5-5 mg by mouth at bedtime. Takes 1/2 tablet everyday except Saturday he takes a whole tablet       Please HPI for pertinent positives, otherwise complete 10 system ROS negative.  Physical Exam: Temp: 97, HR:  64bpm, BP: 113/78mmHg, Resp:16rpm There were no vitals taken for this visit. There is no weight on file to calculate BMI.   General Appearance:  Alert, cooperative, no distress, appears stated age  Head:  Normocephalic, without obvious abnormality, atraumatic  Neck: Right supraclavicular region erythematous and palpable mass felt, area nontender, trachea midline  Lungs:   Clear to auscultation bilaterally, no w/r/r, respirations unlabored without use of accessory muscles.  Chest Wall:  No tenderness or deformity  Heart:  Regular rate and rhythm, murmur appreciated over mitral area.  Abdomen:   Soft, non-tender, non distended.  Extremities: Extremities normal, atraumatic, no cyanosis or edema  Pulses: 1+ and symmetric  Neurologic: Normal affect, no gross deficits.   Results for orders placed during the hospital encounter of 08/29/13 (from the past 48 hour(s))  APTT     Status: Abnormal   Collection Time    08/29/13 11:30 AM      Result Value Range   aPTT 76 (*) 24 - 37 seconds   Comment:            IF BASELINE aPTT IS ELEVATED,     SUGGEST PATIENT RISK ASSESSMENT     BE USED TO DETERMINE APPROPRIATE     ANTICOAGULANT THERAPY.  CBC     Status: Abnormal   Collection Time    08/29/13 11:30 AM      Result Value Range   WBC 6.6  4.0 - 10.5 K/uL   RBC 3.27 (*) 4.22 - 5.81 MIL/uL   Hemoglobin 9.1 (*) 13.0 - 17.0 g/dL   HCT 16.1 (*) 09.6 - 04.5 %   MCV 88.1  78.0 - 100.0 fL   MCH 27.8  26.0 - 34.0 pg   MCHC 31.6  30.0 - 36.0 g/dL   RDW 40.9 (*) 81.1 - 91.4 %   Platelets 146 (*) 150 - 400 K/uL  PROTIME-INR     Status: Abnormal   Collection Time    08/29/13 11:30 AM      Result Value Range   Prothrombin Time 16.7 (*) 11.6 - 15.2 seconds   INR 1.39  0.00 - 1.49   No results found.  Assessment/Plan Metastatic adenocarcinoma s/p palliative right supraclavicular radiation.  Request for port-a-catheter placement by Dr. Myna Hidalgo for chemotherapy. Left IJ will be utilized given right  sided mass and recent radiation.  Labs reviewed, patient has been NPO. Ancef has been ordered pre-procedure. Risks and Benefits discussed with the patient. All of the patient's questions were answered, patient is agreeable to proceed. Consent signed and in chart.   Pattricia Boss D PA-C 08/29/2013, 1:16 PM

## 2013-08-29 NOTE — Progress Notes (Signed)
Ambulated to BR with minimal assist and tolerated this well. Slight bruising at r neck PAC insertion site . Ice pack applied. Dressing remains CDI

## 2013-09-01 ENCOUNTER — Other Ambulatory Visit: Payer: Self-pay | Admitting: Cardiology

## 2013-09-02 ENCOUNTER — Ambulatory Visit (INDEPENDENT_AMBULATORY_CARE_PROVIDER_SITE_OTHER): Payer: Medicare Other | Admitting: Family Medicine

## 2013-09-02 ENCOUNTER — Encounter: Payer: Self-pay | Admitting: *Deleted

## 2013-09-02 VITALS — BP 114/78 | HR 79

## 2013-09-02 DIAGNOSIS — I4891 Unspecified atrial fibrillation: Secondary | ICD-10-CM

## 2013-09-02 DIAGNOSIS — Z7901 Long term (current) use of anticoagulants: Secondary | ICD-10-CM

## 2013-09-02 NOTE — Progress Notes (Signed)
  Subjective:    Patient ID: [name] [name], male    DOB: 30-Aug-1930, 79 y.o.   MRN: [id number] Pt notified of new dosing instructions.  HPI    Review of Systems     Objective:   Physical Exam        Assessment & Plan:

## 2013-09-03 ENCOUNTER — Ambulatory Visit: Payer: Medicare Other

## 2013-09-03 ENCOUNTER — Ambulatory Visit (HOSPITAL_BASED_OUTPATIENT_CLINIC_OR_DEPARTMENT_OTHER): Payer: Medicare Other

## 2013-09-03 ENCOUNTER — Other Ambulatory Visit (HOSPITAL_BASED_OUTPATIENT_CLINIC_OR_DEPARTMENT_OTHER): Payer: Medicare Other | Admitting: Lab

## 2013-09-03 VITALS — BP 123/68 | HR 64 | Temp 97.7°F | Resp 20

## 2013-09-03 DIAGNOSIS — C799 Secondary malignant neoplasm of unspecified site: Secondary | ICD-10-CM

## 2013-09-03 DIAGNOSIS — C801 Malignant (primary) neoplasm, unspecified: Secondary | ICD-10-CM

## 2013-09-03 DIAGNOSIS — I4891 Unspecified atrial fibrillation: Secondary | ICD-10-CM

## 2013-09-03 DIAGNOSIS — Z5111 Encounter for antineoplastic chemotherapy: Secondary | ICD-10-CM

## 2013-09-03 LAB — CMP (CANCER CENTER ONLY)
ALT(SGPT): 15 U/L (ref 10–47)
AST: 28 U/L (ref 11–38)
CO2: 28 mEq/L (ref 18–33)
Chloride: 102 mEq/L (ref 98–108)
Sodium: 139 mEq/L (ref 128–145)
Total Bilirubin: 0.6 mg/dl (ref 0.20–1.60)
Total Protein: 6.5 g/dL (ref 6.4–8.1)

## 2013-09-03 LAB — CBC WITH DIFFERENTIAL (CANCER CENTER ONLY)
Eosinophils Absolute: 0 10*3/uL (ref 0.0–0.5)
HCT: 29.2 % — ABNORMAL LOW (ref 38.7–49.9)
LYMPH#: 0.4 10*3/uL — ABNORMAL LOW (ref 0.9–3.3)
LYMPH%: 5.9 % — ABNORMAL LOW (ref 14.0–48.0)
MCV: 90 fL (ref 82–98)
MONO#: 0.3 10*3/uL (ref 0.1–0.9)
NEUT%: 89.4 % — ABNORMAL HIGH (ref 40.0–80.0)
RBC: 3.23 10*6/uL — ABNORMAL LOW (ref 4.20–5.70)
WBC: 6 10*3/uL (ref 4.0–10.0)

## 2013-09-03 LAB — PROTIME-INR (CHCC SATELLITE): INR: 1.3 — ABNORMAL LOW (ref 2.0–3.5)

## 2013-09-03 MED ORDER — SODIUM CHLORIDE 0.9 % IJ SOLN
10.0000 mL | INTRAMUSCULAR | Status: DC | PRN
  Administered 2013-09-03: 10 mL
  Filled 2013-09-03: qty 10

## 2013-09-03 MED ORDER — HEPARIN SOD (PORK) LOCK FLUSH 100 UNIT/ML IV SOLN
500.0000 [IU] | Freq: Once | INTRAVENOUS | Status: AC | PRN
  Administered 2013-09-03: 500 [IU]
  Filled 2013-09-03: qty 5

## 2013-09-03 MED ORDER — ONDANSETRON 16 MG/50ML IVPB (CHCC)
16.0000 mg | Freq: Once | INTRAVENOUS | Status: AC
  Administered 2013-09-03: 16 mg via INTRAVENOUS

## 2013-09-03 MED ORDER — SODIUM CHLORIDE 0.9 % IV SOLN
400.0000 mg/m2 | Freq: Once | INTRAVENOUS | Status: AC
  Administered 2013-09-03: 700 mg via INTRAVENOUS
  Filled 2013-09-03: qty 28

## 2013-09-03 MED ORDER — SODIUM CHLORIDE 0.9 % IV SOLN
Freq: Once | INTRAVENOUS | Status: AC
  Administered 2013-09-03: 11:00:00 via INTRAVENOUS

## 2013-09-03 MED ORDER — SODIUM CHLORIDE 0.9 % IV SOLN
272.5000 mg | Freq: Once | INTRAVENOUS | Status: AC
  Administered 2013-09-03: 270 mg via INTRAVENOUS
  Filled 2013-09-03: qty 27

## 2013-09-03 MED ORDER — DEXAMETHASONE SODIUM PHOSPHATE 20 MG/5ML IJ SOLN
20.0000 mg | Freq: Once | INTRAMUSCULAR | Status: AC
  Administered 2013-09-03: 20 mg via INTRAVENOUS
  Filled 2013-09-03: qty 5

## 2013-09-03 NOTE — Patient Instructions (Signed)
[facility] Discharge Instructions for Patients Receiving Chemotherapy  Today you received chemotherapy agents. Alimta and Carboplatin  To help prevent nausea and vomiting after your treatment, we encourage you to take your nausea medication as prescribed. If you develop nausea and vomiting that is not controlled by your nausea medication, call the clinic. If it is after clinic hours your family physician or the after hours number for the clinic or go to the Emergency Department.   BELOW ARE SYMPTOMS THAT SHOULD BE REPORTED IMMEDIATELY:  *FEVER GREATER THAN 100.5 F  *CHILLS WITH OR WITHOUT FEVER  NAUSEA AND VOMITING THAT IS NOT CONTROLLED WITH YOUR NAUSEA MEDICATION  *UNUSUAL SHORTNESS OF BREATH  *UNUSUAL BRUISING OR BLEEDING  TENDERNESS IN MOUTH AND THROAT WITH OR WITHOUT PRESENCE OF ULCERS  *URINARY PROBLEMS  *BOWEL PROBLEMS  UNUSUAL RASH Items with * indicate a potential emergency and should be followed up as soon as possible.   Please let the nurse know about any problems that you may have experienced. Feel free to call the clinic you have any questions or concerns. The clinic phone number is [phone].   I have been informed and understand all the instructions given to me. I know to contact the clinic, my physician, or go to the Emergency Department if any problems should occur. I do not have any questions at this time, but understand that I may call the clinic during office hours   should I have any questions or need assistance in obtaining follow up care.    __________________________________________  _____________  __________ Signature of Patient or Authorized Representative            Date                   Time    __________________________________________ Nurse's Signature   Pemetrexed injection What is this medicine? PEMETREXED (PEM e TREX ed) is a chemotherapy drug. This medicine affects cells that are rapidly growing, such as cancer  cells and cells in your mouth and stomach. It is usually used to treat lung cancers like non-small cell lung cancer and mesothelioma. It may also be used to treat other cancers. This medicine may be used for other purposes; ask your health care provider or pharmacist if you have questions. What should I tell my health care provider before I take this medicine? They need to know if you have any of these conditions: -if you frequently drink alcohol containing beverages -infection (especially a virus infection such as chickenpox, cold sores, or herpes) -kidney disease -liver disease -low blood counts, like low platelets, red bloods, or white blood cells -an unusual or allergic reaction to pemetrexed, mannitol, other medicines, foods, dyes, or preservatives -pregnant or trying to get pregnant -breast-feeding How should I use this medicine? This drug is given as an infusion into a vein. It is administered in a hospital or clinic by a specially trained health care professional. Talk to your pediatrician regarding the use of this medicine in children. Special care may be needed. Overdosage: If you think you have taken too much of this medicine contact a poison control center or emergency room at once. NOTE: This medicine is only for you. Do not share this medicine with others. What if I miss a dose? It is important not to miss your dose. Call your doctor or health care professional if you are unable to keep an appointment. What may interact with this medicine? -aspirin and aspirin-like medicines -medicines to increase blood  counts like filgrastim, pegfilgrastim, sargramostim -methotrexate -NSAIDS, medicines for pain and inflammation, like ibuprofen or naproxen -probenecid -pyrimethamine -vaccines Talk to your doctor or health care professional before taking any of these medicines: -acetaminophen -aspirin -ibuprofen -ketoprofen -naproxen This list may not describe all possible interactions.  Give your health care provider a list of all the medicines, herbs, non-prescription drugs, or dietary supplements you use. Also tell them if you smoke, drink alcohol, or use illegal drugs. Some items may interact with your medicine. What should I watch for while using this medicine? Visit your doctor for checks on your progress. This drug may make you feel generally unwell. This is not uncommon, as chemotherapy can affect healthy cells as well as cancer cells. Report any side effects. Continue your course of treatment even though you feel ill unless your doctor tells you to stop. In some cases, you may be given additional medicines to help with side effects. Follow all directions for their use. Call your doctor or health care professional for advice if you get a fever, chills or sore throat, or other symptoms of a cold or flu. Do not treat yourself. This drug decreases your body's ability to fight infections. Try to avoid being around people who are sick. This medicine may increase your risk to bruise or bleed. Call your doctor or health care professional if you notice any unusual bleeding. Be careful brushing and flossing your teeth or using a toothpick because you may get an infection or bleed more easily. If you have any dental work done, tell your dentist you are receiving this medicine. Avoid taking products that contain aspirin, acetaminophen, ibuprofen, naproxen, or ketoprofen unless instructed by your doctor. These medicines may hide a fever. Call your doctor or health care professional if you get diarrhea or mouth sores. Do not treat yourself. To protect your kidneys, drink water or other fluids as directed while you are taking this medicine. Men and women must use effective birth control while taking this medicine. You may also need to continue using effective birth control for a time after stopping this medicine. Do not become pregnant while taking this medicine. Tell your doctor right away if  you think that you or your partner might be pregnant. There is a potential for serious side effects to an unborn child. Talk to your health care professional or pharmacist for more information. Do not breast-feed an infant while taking this medicine. This medicine may lower sperm counts. What side effects may I notice from receiving this medicine? Side effects that you should report to your doctor or health care professional as soon as possible: -allergic reactions like skin rash, itching or hives, swelling of the face, lips, or tongue -low blood counts - this medicine may decrease the number of white blood cells, red blood cells and platelets. You may be at increased risk for infections and bleeding. -signs of infection - fever or chills, cough, sore throat, pain or difficulty passing urine -signs of decreased platelets or bleeding - bruising, pinpoint red spots on the skin, black, tarry stools, blood in the urine -signs of decreased red blood cells - unusually weak or tired, fainting spells, lightheadedness -breathing problems, like a dry cough -changes in emotions or moods -chest pain -confusion -diarrhea -high blood pressure -mouth or throat sores or ulcers -pain, swelling, warmth in the leg -pain on swallowing -swelling of the ankles, feet, hands -trouble passing urine or change in the amount of urine -vomiting -yellowing of the eyes or skin Side  effects that usually do not require medical attention (report to your doctor or health care professional if they continue or are bothersome): -hair loss -loss of appetite -nausea -stomach upset This list may not describe all possible side effects. Call your doctor for medical advice about side effects. You may report side effects to FDA at 1-800-FDA-1088. Where should I keep my medicine? This drug is given in a hospital or clinic and will not be stored at home. NOTE: This sheet is a summary. It may not cover all possible information. If you  have questions about this medicine, talk to your doctor, pharmacist, or health care provider.  2013, [employer] Standard. (07/14/2008 1:24:03 PM) Carboplatin injection What is this medicine? CARBOPLATIN (KAR boe pla tin) is a chemotherapy drug. It targets fast dividing cells, like cancer cells, and causes these cells to die. This medicine is used to treat ovarian cancer and many other cancers. This medicine may be used for other purposes; ask your health care provider or pharmacist if you have questions. What should I tell my health care provider before I take this medicine? They need to know if you have any of these conditions: -blood disorders -hearing problems -kidney disease -recent or ongoing radiation therapy -an unusual or allergic reaction to carboplatin, cisplatin, other chemotherapy, other medicines, foods, dyes, or preservatives -pregnant or trying to get pregnant -breast-feeding How should I use this medicine? This drug is usually given as an infusion into a vein. It is administered in a hospital or clinic by a specially trained health care professional. Talk to your pediatrician regarding the use of this medicine in children. Special care may be needed. Overdosage: If you think you have taken too much of this medicine contact a poison control center or emergency room at once. NOTE: This medicine is only for you. Do not share this medicine with others. What if I miss a dose? It is important not to miss a dose. Call your doctor or health care professional if you are unable to keep an appointment. What may interact with this medicine? -medicines for seizures -medicines to increase blood counts like filgrastim, pegfilgrastim, sargramostim -some antibiotics like amikacin, gentamicin, neomycin, streptomycin, tobramycin -vaccines Talk to your doctor or health care professional before taking any of these medicines: -acetaminophen -aspirin -ibuprofen -ketoprofen -naproxen This  list may not describe all possible interactions. Give your health care provider a list of all the medicines, herbs, non-prescription drugs, or dietary supplements you use. Also tell them if you smoke, drink alcohol, or use illegal drugs. Some items may interact with your medicine. What should I watch for while using this medicine? Your condition will be monitored carefully while you are receiving this medicine. You will need important blood work done while you are taking this medicine. This drug may make you feel generally unwell. This is not uncommon, as chemotherapy can affect healthy cells as well as cancer cells. Report any side effects. Continue your course of treatment even though you feel ill unless your doctor tells you to stop. In some cases, you may be given additional medicines to help with side effects. Follow all directions for their use. Call your doctor or health care professional for advice if you get a fever, chills or sore throat, or other symptoms of a cold or flu. Do not treat yourself. This drug decreases your body's ability to fight infections. Try to avoid being around people who are sick. This medicine may increase your risk to bruise or bleed. Call your doctor  or health care professional if you notice any unusual bleeding. Be careful brushing and flossing your teeth or using a toothpick because you may get an infection or bleed more easily. If you have any dental work done, tell your dentist you are receiving this medicine. Avoid taking products that contain aspirin, acetaminophen, ibuprofen, naproxen, or ketoprofen unless instructed by your doctor. These medicines may hide a fever. Do not become pregnant while taking this medicine. Women should inform their doctor if they wish to become pregnant or think they might be pregnant. There is a potential for serious side effects to an unborn child. Talk to your health care professional or pharmacist for more information. Do not  breast-feed an infant while taking this medicine. What side effects may I notice from receiving this medicine? Side effects that you should report to your doctor or health care professional as soon as possible: -allergic reactions like skin rash, itching or hives, swelling of the face, lips, or tongue -signs of infection - fever or chills, cough, sore throat, pain or difficulty passing urine -signs of decreased platelets or bleeding - bruising, pinpoint red spots on the skin, black, tarry stools, nosebleeds -signs of decreased red blood cells - unusually weak or tired, fainting spells, lightheadedness -breathing problems -changes in hearing -changes in vision -chest pain -high blood pressure -low blood counts - This drug may decrease the number of white blood cells, red blood cells and platelets. You may be at increased risk for infections and bleeding. -nausea and vomiting -pain, swelling, redness or irritation at the injection site -pain, tingling, numbness in the hands or feet -problems with balance, talking, walking -trouble passing urine or change in the amount of urine Side effects that usually do not require medical attention (report to your doctor or health care professional if they continue or are bothersome): -hair loss -loss of appetite -metallic taste in the mouth or changes in taste This list may not describe all possible side effects. Call your doctor for medical advice about side effects. You may report side effects to FDA at 1-800-FDA-1088. Where should I keep my medicine? This drug is given in a hospital or clinic and will not be stored at home. NOTE: This sheet is a summary. It may not cover all possible information. If you have questions about this medicine, talk to your doctor, pharmacist, or health care provider.  2012, [employer]vier/Gold Standard. (03/17/2008 2:38:05 PM)

## 2013-09-04 ENCOUNTER — Ambulatory Visit: Payer: Medicare Other | Admitting: Radiation Oncology

## 2013-09-04 LAB — ERYTHROPOIETIN: Erythropoietin: 7.2 m[IU]/mL (ref 2.6–18.5)

## 2013-09-05 ENCOUNTER — Encounter: Payer: Self-pay | Admitting: Hematology & Oncology

## 2013-09-10 ENCOUNTER — Telehealth: Payer: Self-pay | Admitting: *Deleted

## 2013-09-10 ENCOUNTER — Encounter: Payer: Self-pay | Admitting: *Deleted

## 2013-09-10 ENCOUNTER — Other Ambulatory Visit: Payer: Self-pay | Admitting: *Deleted

## 2013-09-10 ENCOUNTER — Ambulatory Visit (INDEPENDENT_AMBULATORY_CARE_PROVIDER_SITE_OTHER): Payer: Medicare Other | Admitting: Family Medicine

## 2013-09-10 VITALS — BP 142/78 | HR 65

## 2013-09-10 DIAGNOSIS — C799 Secondary malignant neoplasm of unspecified site: Secondary | ICD-10-CM

## 2013-09-10 DIAGNOSIS — Z7901 Long term (current) use of anticoagulants: Secondary | ICD-10-CM

## 2013-09-10 DIAGNOSIS — N189 Chronic kidney disease, unspecified: Secondary | ICD-10-CM

## 2013-09-10 DIAGNOSIS — I4891 Unspecified atrial fibrillation: Secondary | ICD-10-CM

## 2013-09-10 LAB — POCT INR: INR: 2

## 2013-09-10 MED ORDER — WARFARIN SODIUM 5 MG PO TABS: 2.5000 mg | ORAL_TABLET | Freq: Every day | ORAL | Status: DC

## 2013-09-10 NOTE — Progress Notes (Signed)
No changes in diet. No skipped doses.[name], [name]

## 2013-09-10 NOTE — Telephone Encounter (Signed)
Pt called and informed to do the following Increase dosage: 1 tablet on Tues, thur and Sat. Half tab all other days. Recheck in 2 weeks.[name], [name]

## 2013-09-11 ENCOUNTER — Ambulatory Visit (HOSPITAL_BASED_OUTPATIENT_CLINIC_OR_DEPARTMENT_OTHER): Payer: Medicare Other | Admitting: Hematology & Oncology

## 2013-09-11 ENCOUNTER — Other Ambulatory Visit (HOSPITAL_BASED_OUTPATIENT_CLINIC_OR_DEPARTMENT_OTHER): Payer: Medicare Other | Admitting: Lab

## 2013-09-11 VITALS — BP 92/53 | HR 52 | Temp 97.0°F | Resp 18 | Ht 64.0 in | Wt 151.0 lb

## 2013-09-11 DIAGNOSIS — N189 Chronic kidney disease, unspecified: Secondary | ICD-10-CM

## 2013-09-11 DIAGNOSIS — I340 Nonrheumatic mitral (valve) insufficiency: Secondary | ICD-10-CM

## 2013-09-11 DIAGNOSIS — I059 Rheumatic mitral valve disease, unspecified: Secondary | ICD-10-CM

## 2013-09-11 DIAGNOSIS — C801 Malignant (primary) neoplasm, unspecified: Secondary | ICD-10-CM

## 2013-09-11 DIAGNOSIS — C799 Secondary malignant neoplasm of unspecified site: Secondary | ICD-10-CM

## 2013-09-11 LAB — CBC WITH DIFFERENTIAL (CANCER CENTER ONLY)
BASO#: 0 10*3/uL (ref 0.0–0.2)
BASO%: 0 % (ref 0.0–2.0)
EOS%: 1.7 % (ref 0.0–7.0)
HCT: 28.3 % — ABNORMAL LOW (ref 38.7–49.9)
HGB: 9 g/dL — ABNORMAL LOW (ref 13.0–17.1)
LYMPH#: 0.2 10*3/uL — ABNORMAL LOW (ref 0.9–3.3)
LYMPH%: 5.9 % — ABNORMAL LOW (ref 14.0–48.0)
MCH: 28.6 pg (ref 28.0–33.4)
MCHC: 31.8 g/dL — ABNORMAL LOW (ref 32.0–35.9)
MCV: 90 fL (ref 82–98)
MONO%: 0.7 % (ref 0.0–13.0)
NEUT%: 91.7 % — ABNORMAL HIGH (ref 40.0–80.0)
RDW: 17.3 % — ABNORMAL HIGH (ref 11.1–15.7)

## 2013-09-11 LAB — COMPREHENSIVE METABOLIC PANEL
ALT: 22 U/L (ref 0–53)
AST: 19 U/L (ref 0–37)
Alkaline Phosphatase: 53 U/L (ref 39–117)
BUN: 49 mg/dL — ABNORMAL HIGH (ref 6–23)
Creatinine, Ser: 1.78 mg/dL — ABNORMAL HIGH (ref 0.50–1.35)
Total Bilirubin: 0.8 mg/dL (ref 0.3–1.2)

## 2013-09-11 NOTE — Progress Notes (Signed)
This office note has been dictated.

## 2013-09-22 ENCOUNTER — Ambulatory Visit (HOSPITAL_BASED_OUTPATIENT_CLINIC_OR_DEPARTMENT_OTHER): Payer: Medicare Other | Admitting: Lab

## 2013-09-22 ENCOUNTER — Ambulatory Visit (HOSPITAL_BASED_OUTPATIENT_CLINIC_OR_DEPARTMENT_OTHER): Payer: Medicare Other

## 2013-09-22 VITALS — BP 93/56 | HR 99 | Temp 97.6°F | Resp 16

## 2013-09-22 DIAGNOSIS — C799 Secondary malignant neoplasm of unspecified site: Secondary | ICD-10-CM

## 2013-09-22 DIAGNOSIS — C801 Malignant (primary) neoplasm, unspecified: Secondary | ICD-10-CM

## 2013-09-22 DIAGNOSIS — I4891 Unspecified atrial fibrillation: Secondary | ICD-10-CM

## 2013-09-22 LAB — CBC WITH DIFFERENTIAL (CANCER CENTER ONLY)
BASO#: 0 10*3/uL (ref 0.0–0.2)
Eosinophils Absolute: 0 10*3/uL (ref 0.0–0.5)
HCT: 26.8 % — ABNORMAL LOW (ref 38.7–49.9)
HGB: 8.5 g/dL — ABNORMAL LOW (ref 13.0–17.1)
LYMPH#: 0.5 10*3/uL — ABNORMAL LOW (ref 0.9–3.3)
MONO#: 0.6 10*3/uL (ref 0.1–0.9)
NEUT#: 4.8 10*3/uL (ref 1.5–6.5)
NEUT%: 80 % (ref 40.0–80.0)
RBC: 2.99 10*6/uL — ABNORMAL LOW (ref 4.20–5.70)

## 2013-09-22 LAB — CMP (CANCER CENTER ONLY)
AST: 26 U/L (ref 11–38)
Albumin: 2.2 g/dL — ABNORMAL LOW (ref 3.3–5.5)
BUN, Bld: 73 mg/dL — ABNORMAL HIGH (ref 7–22)
CO2: 22 mEq/L (ref 18–33)
Calcium: 8.3 mg/dL (ref 8.0–10.3)
Chloride: 107 mEq/L (ref 98–108)
Creat: 2.7 mg/dl — ABNORMAL HIGH (ref 0.6–1.2)
Glucose, Bld: 162 mg/dL — ABNORMAL HIGH (ref 73–118)
Potassium: 4.2 mEq/L (ref 3.3–4.7)

## 2013-09-22 MED ORDER — DARBEPOETIN ALFA-POLYSORBATE 300 MCG/0.6ML IJ SOLN
INTRAMUSCULAR | Status: AC
  Filled 2013-09-22: qty 0.6

## 2013-09-22 MED ORDER — SODIUM CHLORIDE 0.9 % IV SOLN
Freq: Once | INTRAVENOUS | Status: AC
  Administered 2013-09-22: 16:00:00 via INTRAVENOUS

## 2013-09-23 ENCOUNTER — Other Ambulatory Visit: Payer: Medicare Other | Admitting: Lab

## 2013-09-23 ENCOUNTER — Encounter: Payer: Self-pay | Admitting: *Deleted

## 2013-09-23 ENCOUNTER — Ambulatory Visit (INDEPENDENT_AMBULATORY_CARE_PROVIDER_SITE_OTHER): Payer: Medicare Other | Admitting: Family Medicine

## 2013-09-23 ENCOUNTER — Ambulatory Visit (HOSPITAL_COMMUNITY)
Admission: RE | Admit: 2013-09-23 | Discharge: 2013-09-23 | Disposition: A | Discharge status: Home / Self Care | Payer: Medicare Other | Source: Ambulatory Visit | Attending: Hematology & Oncology | Admitting: Hematology & Oncology

## 2013-09-23 VITALS — BP 78/53 | HR 105 | Wt 148.0 lb

## 2013-09-23 DIAGNOSIS — Z7901 Long term (current) use of anticoagulants: Secondary | ICD-10-CM

## 2013-09-23 DIAGNOSIS — C801 Malignant (primary) neoplasm, unspecified: Secondary | ICD-10-CM

## 2013-09-23 DIAGNOSIS — I4891 Unspecified atrial fibrillation: Secondary | ICD-10-CM

## 2013-09-23 LAB — PROTIME-INR
INR: 10.35 (ref <1.50)
Prothrombin Time: 77.4 seconds — ABNORMAL HIGH (ref 11.6–15.2)

## 2013-09-23 LAB — POCT INR: INR: 8.07

## 2013-09-23 LAB — ABO/RH: ABO/RH(D): O POS

## 2013-09-23 NOTE — Progress Notes (Signed)
CC:   [name], M.D.  DIAGNOSIS:  Metastatic adenocarcinoma of the lung.  CURRENT THERAPY:  The patient is status post 1 cycle of carboplatin/Alimta.  INTERIM HISTORY:  Mr. Mcquerry comes in for followup.  He actually looks incredibly well.  He tolerated his first cycle of chemotherapy quite nicely.  He has had very little in the way of nausea, vomiting.  There have been no mouth sores.  The mass on his right neck is shrinking quite nicely.  He has had no problems with abdominal pain.  His appetite has been okay. He has had no fevers, sweats or chills.  He has had no diarrhea.  He has had no leg swelling.  There have been no rashes.  He has had no tingling in the hands or feet.  PHYSICAL EXAMINATION:  General:  This is an elderly white gentleman in no obvious distress.  Vital signs:  Temperature of 97, pulse 52, respiratory rate 18, blood pressure 92/53.  Weight is 151.  Head and neck:  Normocephalic, atraumatic skull.  He has no ocular or oral lesions.  He has no mucositis.  There is no scleral icterus.  The lymphadenopathy in the right supraclavicular region has regressed quite nicely.  He still has about a 3 x 3 cm mobile mass.  This is nontender. There is no left cervical or supraclavicular lymph nodes.  Lungs:  Clear bilaterally.  Cardiac:  Regular rate and rhythm with a normal S1 and S2. There are no murmurs, rubs or bruits.  Abdomen:  Soft.  He has good bowel sounds.  There is no fluid wave.  There is no palpable hepatosplenomegaly.  Extremities:  Show no clubbing, cyanosis or edema. Neurological:  Shows no focal neurological deficits.  LABORATORY STUDIES:  White cell count is 2.9, hemoglobin 9, hematocrit 28.3, platelet count is 61,000.  IMPRESSION:  Mr. Farrelly is a very nice 79 year old gentleman with metastatic adenocarcinoma of the lung.  He is on chemotherapy.  This is strictly palliative.  __________ helped him out quite nicely.  The right supraclavicular  adenopathy has regressed.  We will go ahead and plan for his second cycle to begin in a couple of weeks.  Note his platelet count is on the low side.  He is on Coumadin. His INR yesterday, he said, was 2.  Of note, his erythropoietin level is only 7.2.  We will certainly consider Aranesp if necessary.  I will plan to get him back for his second cycle on October 1st.  I will plan for a PET scan after this second cycle of treatment.    ______________________________ Josph Macho, M.D. PRE/MEDQ  D:  09/11/2013  T:  09/23/2013  Job:  [id number]

## 2013-09-23 NOTE — Progress Notes (Signed)
  Subjective:    Patient ID: [name] [name], male    DOB: 09-12-1930, 79 y.o.   MRN: [id number]  HPI    Review of Systems     Objective:   Physical Exam        Assessment & Plan:  INR was 8.0 today. He does have a Port-A-Cath. He was scheduled to go over to the cancer Center today for type and cross for a transfusion for tomorrow. We called and they're willing to accept a stat order for an INR and sitting having a venipuncture drawn downstairs. Patient given lab slip. And told to skip tonight's dose no matter what. [name], MD

## 2013-09-23 NOTE — Progress Notes (Signed)
Patient was in office to have INR checked it was 8.0 there was some bruising note on the patient arms and finger. Patient stated that he has to have blood work done today He was sent to the lab.[name] [name],CMA

## 2013-09-23 NOTE — Progress Notes (Signed)
  Subjective:    Patient ID: [name] [name], male    DOB: 09/24/1930, 79 y.o.   MRN: [id number] Informed pt to hold Coumadin x 2 days. Spoke with Amy at dr. Isabell Jarvis office and she states to inform pt they would not be transfusing pt on Wednesday due to elevated INR. Will call Amy back on Thursday after INR check at [phone]. Pt and wife informed.  Meyer Cory, LPN  HPI    Review of Systems     Objective:   Physical Exam        Assessment & Plan:

## 2013-09-24 ENCOUNTER — Ambulatory Visit (HOSPITAL_BASED_OUTPATIENT_CLINIC_OR_DEPARTMENT_OTHER): Payer: Medicare Other

## 2013-09-24 ENCOUNTER — Telehealth: Payer: Self-pay | Admitting: *Deleted

## 2013-09-24 ENCOUNTER — Encounter: Payer: Self-pay | Admitting: Hematology & Oncology

## 2013-09-24 ENCOUNTER — Ambulatory Visit: Payer: Medicare Other

## 2013-09-24 ENCOUNTER — Other Ambulatory Visit: Payer: Medicare Other | Admitting: Lab

## 2013-09-24 VITALS — BP 112/73 | HR 70 | Temp 97.9°F | Resp 20

## 2013-09-24 DIAGNOSIS — C801 Malignant (primary) neoplasm, unspecified: Secondary | ICD-10-CM

## 2013-09-24 DIAGNOSIS — R791 Abnormal coagulation profile: Secondary | ICD-10-CM

## 2013-09-24 DIAGNOSIS — Z7901 Long term (current) use of anticoagulants: Secondary | ICD-10-CM

## 2013-09-24 LAB — PROTHROMBIN TIME
INR: 8.22 (ref <1.50)
Prothrombin Time: 64.9 seconds — ABNORMAL HIGH (ref 11.6–15.2)

## 2013-09-24 LAB — PREPARE RBC (CROSSMATCH)

## 2013-09-24 LAB — HOLD TUBE, BLOOD BANK - CHCC SATELLITE

## 2013-09-24 MED ORDER — ACETAMINOPHEN 325 MG PO TABS
650.0000 mg | ORAL_TABLET | Freq: Once | ORAL | Status: AC
  Administered 2013-09-24: 650 mg via ORAL

## 2013-09-24 MED ORDER — SODIUM CHLORIDE 0.9 % IV SOLN
250.0000 mL | Freq: Once | INTRAVENOUS | Status: AC
  Administered 2013-09-24: 250 mL via INTRAVENOUS

## 2013-09-24 MED ORDER — HEPARIN SOD (PORK) LOCK FLUSH 100 UNIT/ML IV SOLN
250.0000 [IU] | INTRAVENOUS | Status: AC | PRN
  Filled 2013-09-24: qty 5

## 2013-09-24 MED ORDER — PHYTONADIONE 10 MG/ML INJECTION
2.0000 mg | Freq: Once | INTRAMUSCULAR | Status: AC
  Administered 2013-09-24: 2 mg via SUBCUTANEOUS
  Filled 2013-09-24: qty 1

## 2013-09-24 MED ORDER — FUROSEMIDE 10 MG/ML IJ SOLN
INTRAMUSCULAR | Status: AC
  Filled 2013-09-24: qty 4

## 2013-09-24 MED ORDER — SODIUM CHLORIDE 0.9 % IJ SOLN
10.0000 mL | INTRAMUSCULAR | Status: AC | PRN
  Filled 2013-09-24: qty 10

## 2013-09-24 MED ORDER — ACETAMINOPHEN 325 MG PO TABS
ORAL_TABLET | ORAL | Status: AC
  Filled 2013-09-24: qty 2

## 2013-09-24 MED ORDER — FUROSEMIDE 10 MG/ML IJ SOLN
20.0000 mg | Freq: Once | INTRAMUSCULAR | Status: AC
  Administered 2013-09-24: 20 mg via INTRAVENOUS

## 2013-09-24 NOTE — Telephone Encounter (Signed)
Called and LM for Amy at Dr. Gustavo Lah office to call me when they checked pt's INR so that we can schedule him Friday for his next INR check.  Meyer Cory, LPN

## 2013-09-24 NOTE — Telephone Encounter (Signed)
Spoke with [name] [name] at Dr. Gustavo Lah office. She states pt's INR was 8 and that it was pulled from porta cath but 20cc was wasted. Please advise if you want him to come in Thursday or want me to change him to Friday.  Meyer Cory, LPN

## 2013-09-24 NOTE — Telephone Encounter (Signed)
Call to let you know that Dr. Myna Hidalgo states that he is going to still transfuse pt today even though INR is 10. States he thinks it will help his INR. Wanted to let you know what there plan was. Amy also states they may be checking his INR again today at their office.  Meyer Cory, LPN

## 2013-09-24 NOTE — Patient Instructions (Signed)
Blood Transfusion  A blood transfusion replaces your blood or some of its parts. Blood is replaced when you have lost blood because of surgery, an accident, or for severe blood conditions like anemia. You can donate blood to be used on yourself if you have a planned surgery. If you lose blood during that surgery, your own blood can be given back to you. Any blood given to you is checked to make sure it matches your blood type. Your temperature, blood pressure, and heart rate (vital signs) will be checked often.  GET HELP RIGHT AWAY IF:   You feel sick to your stomach (nauseous) or throw up (vomit).  You have watery poop (diarrhea).  You have shortness of breath or trouble breathing.  You have blood in your pee (urine) or have dark colored pee.  You have chest pain or tightness.  Your eyes or skin turn yellow (jaundice).  You have a temperature by mouth above 102 F (38.9 C), not controlled by medicine.  You start to shake and have chills.  You develop a a red rash (hives) or feel itchy.  You develop lightheadedness or feel confused.  You develop back, joint, or muscle pain.  You do not feel hungry (lost appetite).  You feel tired, restless, or nervous.  You develop belly (abdominal) cramps. Document Released: 03/09/2009 Document Revised: 03/04/2012 Document Reviewed: 03/09/2009 Exit[employer] Patient Information 2014 [employer].  

## 2013-09-24 NOTE — Telephone Encounter (Signed)
Sounds good. He'll keep my operative INR. Likely can reschedule repeat INR for Friday instead of tomorrow.

## 2013-09-25 ENCOUNTER — Ambulatory Visit: Payer: Medicare Other

## 2013-09-25 LAB — TYPE AND SCREEN
Antibody Screen: NEGATIVE
Unit division: 0

## 2013-09-25 NOTE — Telephone Encounter (Signed)
Spoke with pt and informed  Him to come Friday for an INR check at 1:15pm and to hold Warfarin tonight.  Meyer Cory, LPN

## 2013-09-25 NOTE — Telephone Encounter (Signed)
Hold the coumadin for one more night and then come in tomorrow for check.

## 2013-09-26 ENCOUNTER — Encounter: Payer: Self-pay | Admitting: *Deleted

## 2013-09-26 ENCOUNTER — Ambulatory Visit (INDEPENDENT_AMBULATORY_CARE_PROVIDER_SITE_OTHER): Payer: Medicare Other | Admitting: Family Medicine

## 2013-09-26 VITALS — BP 106/76 | HR 67 | Wt 140.0 lb

## 2013-09-26 DIAGNOSIS — I4891 Unspecified atrial fibrillation: Secondary | ICD-10-CM

## 2013-09-26 DIAGNOSIS — Z7901 Long term (current) use of anticoagulants: Secondary | ICD-10-CM

## 2013-09-26 LAB — POCT INR: INR: 3.9

## 2013-09-26 NOTE — Progress Notes (Signed)
Pt had blood transfusion 2 days ago and was told to d/c coumadin.Loralee Pacas [place]

## 2013-09-26 NOTE — Progress Notes (Signed)
Pt informed and [name] [name] informed to place pt on schedule for Monday 09/29/13 at 1:15pm for INR check.  Meyer Cory, LPN

## 2013-09-29 ENCOUNTER — Ambulatory Visit (INDEPENDENT_AMBULATORY_CARE_PROVIDER_SITE_OTHER): Payer: Medicare Other | Admitting: Family Medicine

## 2013-09-29 ENCOUNTER — Encounter: Payer: Self-pay | Admitting: *Deleted

## 2013-09-29 VITALS — BP 102/72 | HR 123 | Wt 142.0 lb

## 2013-09-29 DIAGNOSIS — I4891 Unspecified atrial fibrillation: Secondary | ICD-10-CM

## 2013-09-29 DIAGNOSIS — Z7901 Long term (current) use of anticoagulants: Secondary | ICD-10-CM

## 2013-09-29 LAB — POCT INR: INR: 2.8

## 2013-09-29 NOTE — Progress Notes (Signed)
Patient was in office for INR check. It was 2.8 and there was some bruising notes on patients right arm. Ovidio Steele,CMA

## 2013-09-30 NOTE — Progress Notes (Signed)
Patient was notified of coumadin dose change. Alexah Kivett,CMA

## 2013-10-01 ENCOUNTER — Ambulatory Visit (HOSPITAL_BASED_OUTPATIENT_CLINIC_OR_DEPARTMENT_OTHER): Payer: Medicare Other | Admitting: Hematology & Oncology

## 2013-10-01 ENCOUNTER — Other Ambulatory Visit (HOSPITAL_BASED_OUTPATIENT_CLINIC_OR_DEPARTMENT_OTHER): Payer: Medicare Other | Admitting: Lab

## 2013-10-01 ENCOUNTER — Ambulatory Visit (HOSPITAL_BASED_OUTPATIENT_CLINIC_OR_DEPARTMENT_OTHER): Payer: Medicare Other

## 2013-10-01 VITALS — BP 87/62 | HR 96 | Temp 98.0°F | Resp 16 | Ht 64.0 in | Wt 141.0 lb

## 2013-10-01 DIAGNOSIS — C801 Malignant (primary) neoplasm, unspecified: Secondary | ICD-10-CM

## 2013-10-01 DIAGNOSIS — C50919 Malignant neoplasm of unspecified site of unspecified female breast: Secondary | ICD-10-CM

## 2013-10-01 DIAGNOSIS — C3490 Malignant neoplasm of unspecified part of unspecified bronchus or lung: Secondary | ICD-10-CM

## 2013-10-01 DIAGNOSIS — Z5111 Encounter for antineoplastic chemotherapy: Secondary | ICD-10-CM

## 2013-10-01 DIAGNOSIS — C799 Secondary malignant neoplasm of unspecified site: Secondary | ICD-10-CM

## 2013-10-01 DIAGNOSIS — I4891 Unspecified atrial fibrillation: Secondary | ICD-10-CM

## 2013-10-01 LAB — CBC WITH DIFFERENTIAL (CANCER CENTER ONLY)
BASO#: 0.1 10*3/uL (ref 0.0–0.2)
EOS%: 0.2 % (ref 0.0–7.0)
Eosinophils Absolute: 0 10*3/uL (ref 0.0–0.5)
HGB: 10.7 g/dL — ABNORMAL LOW (ref 13.0–17.1)
LYMPH#: 0.7 10*3/uL — ABNORMAL LOW (ref 0.9–3.3)
LYMPH%: 6.3 % — ABNORMAL LOW (ref 14.0–48.0)
MCH: 29.6 pg (ref 28.0–33.4)
MCHC: 32.4 g/dL (ref 32.0–35.9)
MONO%: 13.3 % — ABNORMAL HIGH (ref 0.0–13.0)
NEUT#: 9 10*3/uL — ABNORMAL HIGH (ref 1.5–6.5)
RBC: 3.62 10*6/uL — ABNORMAL LOW (ref 4.20–5.70)
RDW: 18.6 % — ABNORMAL HIGH (ref 11.1–15.7)

## 2013-10-01 LAB — TECHNOLOGIST REVIEW CHCC SATELLITE

## 2013-10-01 LAB — CMP (CANCER CENTER ONLY)
AST: 22 U/L (ref 11–38)
Albumin: 2.5 g/dL — ABNORMAL LOW (ref 3.3–5.5)
Alkaline Phosphatase: 76 U/L (ref 26–84)
BUN, Bld: 31 mg/dL — ABNORMAL HIGH (ref 7–22)
Chloride: 101 mEq/L (ref 98–108)
Creat: 2.7 mg/dl — ABNORMAL HIGH (ref 0.6–1.2)
Glucose, Bld: 100 mg/dL (ref 73–118)
Potassium: 4.7 mEq/L (ref 3.3–4.7)
Total Bilirubin: 0.7 mg/dl (ref 0.20–1.60)

## 2013-10-01 MED ORDER — SODIUM CHLORIDE 0.9 % IJ SOLN
10.0000 mL | INTRAMUSCULAR | Status: DC | PRN
  Administered 2013-10-01: 10 mL
  Filled 2013-10-01: qty 10

## 2013-10-01 MED ORDER — ONDANSETRON 16 MG/50ML IVPB (CHCC)
16.0000 mg | Freq: Once | INTRAVENOUS | Status: AC
  Administered 2013-10-01: 16 mg via INTRAVENOUS

## 2013-10-01 MED ORDER — SODIUM CHLORIDE 0.9 % IV SOLN
234.5000 mg | Freq: Once | INTRAVENOUS | Status: AC
  Administered 2013-10-01: 230 mg via INTRAVENOUS
  Filled 2013-10-01: qty 23

## 2013-10-01 MED ORDER — SODIUM CHLORIDE 0.9 % IV SOLN
Freq: Once | INTRAVENOUS | Status: AC
  Administered 2013-10-01: 11:00:00 via INTRAVENOUS

## 2013-10-01 MED ORDER — HEPARIN SOD (PORK) LOCK FLUSH 100 UNIT/ML IV SOLN
500.0000 [IU] | Freq: Once | INTRAVENOUS | Status: AC | PRN
  Administered 2013-10-01: 500 [IU]
  Filled 2013-10-01: qty 5

## 2013-10-01 MED ORDER — DEXAMETHASONE SODIUM PHOSPHATE 20 MG/5ML IJ SOLN
20.0000 mg | Freq: Once | INTRAMUSCULAR | Status: AC
  Administered 2013-10-01: 20 mg via INTRAVENOUS

## 2013-10-01 MED ORDER — SODIUM CHLORIDE 0.9 % IV SOLN
300.0000 mg/m2 | Freq: Once | INTRAVENOUS | Status: AC
  Administered 2013-10-01: 525 mg via INTRAVENOUS
  Filled 2013-10-01: qty 21

## 2013-10-01 MED ORDER — ONDANSETRON 16 MG/50ML IVPB (CHCC)
INTRAVENOUS | Status: AC
Start: 2013-10-01 — End: 2013-10-01
  Filled 2013-10-01: qty 16

## 2013-10-01 NOTE — Patient Instructions (Signed)
[facility] Discharge Instructions for Patients Receiving Chemotherapy  Today you received the following chemotherapy agents Alimta, Carboplatin  To help prevent nausea and vomiting after your treatment, we encourage you to take your nausea medication    If you develop nausea and vomiting that is not controlled by your nausea medication, call the clinic.   BELOW ARE SYMPTOMS THAT SHOULD BE REPORTED IMMEDIATELY:  *FEVER GREATER THAN 100.5 F  *CHILLS WITH OR WITHOUT FEVER  NAUSEA AND VOMITING THAT IS NOT CONTROLLED WITH YOUR NAUSEA MEDICATION  *UNUSUAL SHORTNESS OF BREATH  *UNUSUAL BRUISING OR BLEEDING  TENDERNESS IN MOUTH AND THROAT WITH OR WITHOUT PRESENCE OF ULCERS  *URINARY PROBLEMS  *BOWEL PROBLEMS  UNUSUAL RASH Items with * indicate a potential emergency and should be followed up as soon as possible.  Feel free to call the clinic you have any questions or concerns. The clinic phone number is [phone].    

## 2013-10-01 NOTE — Progress Notes (Signed)
This office note has been dictated.

## 2013-10-02 NOTE — Progress Notes (Signed)
CC:   [name], M.D.  DIAGNOSIS:  Metastatic adenocarcinoma of the lung.  CURRENT THERAPY:  Patient status post 1 cycle of carboplatin/Alimta.  INTERIM HISTORY:  Mr. Baratta comes in for followup.  He is doing fairly well.  He has had some issues with the chemotherapy.  We did give him some blood last week.  His INR was quite high.  His Coumadin dose has been adjusted a little bit.  He has noticed that the mass on his right neck continues to shrink.  He has had no cough.  His appetite has been down a little bit.  He has lost 10 pound since we last checked him a month ago.  He has had no leg swelling.  He has had no rashes.  He has had no change in bowel or bladder habits.  The main problem with him is that he just does not have much of an appetite.  He continues on the amiodarone for the atrial fibrillation.  PHYSICAL EXAMINATION:  General:  This is an elderly white gentleman in no obvious distress.  Vital signs:  Temperature of 98, pulse 96, respiratory rate 16, blood pressure 87/62.  Weight is 141 pounds.  Head and neck:  Normocephalic, atraumatic skull.  There are no ocular or oral lesions.  There are no palpable cervical lymph nodes.  He does have the right supraclavicular lymph node.  This probably measures about 2 x 2 cm.  It is mobile and nontender.  Lungs:  Clear bilaterally.  Cardiac: Regular rate and rhythm with a normal S1 and S2.  There are no murmurs, rubs, or bruits.  Abdomen:  Soft.  He has good bowel sounds.  There is no palpable abdominal mass.  There is no palpable hepatosplenomegaly. Extremities:  No clubbing, cyanosis, or edema.  He has 4/5 strength bilaterally.  He has good range of motion of his joints.  He has some gouty changes in his joints.  Skin:  Some seborrheic and actinic keratoses.  Some ecchymoses are noted on the back of his hands. Neurological:  No focal neurological deficits.  LABORATORY STUDIES:  White cell count is 11.4, hemoglobin  10.7, hematocrit 33, platelet count 89,000.  IMPRESSION:  Mr. Treat is a nice 79 year old gentleman.  He has metastatic adenocarcinoma of the lung.  He has had radiation to the right supraclavicular mass.  This has helped.  Hopefully, chemotherapy will continue to help.  I will reduce his dose of chemotherapy a little bit more with this cycle.  I want to go ahead and get another PET scan on him.  We will get this in about 4 weeks.  I will see him back in about 5 or 6 weeks.  I think if we continue him on chemotherapy, we may have to go to "maintenance," which is a single drug.  I spent a good half hour or so with Mr. Beauregard and his wife.  I reviewed his lab work with him.    ______________________________ Josph Macho, M.D. PRE/MEDQ  D:  10/01/2013  T:  10/02/2013  Job:  [id number]

## 2013-10-03 ENCOUNTER — Other Ambulatory Visit: Payer: Self-pay | Admitting: Family Medicine

## 2013-10-11 ENCOUNTER — Encounter (HOSPITAL_BASED_OUTPATIENT_CLINIC_OR_DEPARTMENT_OTHER): Payer: Self-pay | Admitting: Emergency Medicine

## 2013-10-11 ENCOUNTER — Inpatient Hospital Stay (HOSPITAL_BASED_OUTPATIENT_CLINIC_OR_DEPARTMENT_OTHER)
Admission: EM | Admit: 2013-10-11 | Discharge: 2013-10-15 | DRG: 809 | Disposition: A | Discharge status: Home / Self Care | Payer: Medicare Other | Attending: Internal Medicine | Admitting: Internal Medicine

## 2013-10-11 DIAGNOSIS — Z951 Presence of aortocoronary bypass graft: Secondary | ICD-10-CM

## 2013-10-11 DIAGNOSIS — I10 Essential (primary) hypertension: Secondary | ICD-10-CM | POA: Diagnosis present

## 2013-10-11 DIAGNOSIS — I2510 Atherosclerotic heart disease of native coronary artery without angina pectoris: Secondary | ICD-10-CM

## 2013-10-11 DIAGNOSIS — R040 Epistaxis: Secondary | ICD-10-CM

## 2013-10-11 DIAGNOSIS — I509 Heart failure, unspecified: Secondary | ICD-10-CM | POA: Diagnosis present

## 2013-10-11 DIAGNOSIS — I480 Paroxysmal atrial fibrillation: Secondary | ICD-10-CM

## 2013-10-11 DIAGNOSIS — Z7901 Long term (current) use of anticoagulants: Secondary | ICD-10-CM

## 2013-10-11 DIAGNOSIS — I5032 Chronic diastolic (congestive) heart failure: Secondary | ICD-10-CM

## 2013-10-11 DIAGNOSIS — Z79899 Other long term (current) drug therapy: Secondary | ICD-10-CM

## 2013-10-11 DIAGNOSIS — E7800 Pure hypercholesterolemia, unspecified: Secondary | ICD-10-CM | POA: Diagnosis present

## 2013-10-11 DIAGNOSIS — D638 Anemia in other chronic diseases classified elsewhere: Secondary | ICD-10-CM

## 2013-10-11 DIAGNOSIS — I4891 Unspecified atrial fibrillation: Secondary | ICD-10-CM | POA: Diagnosis present

## 2013-10-11 DIAGNOSIS — D696 Thrombocytopenia, unspecified: Secondary | ICD-10-CM

## 2013-10-11 DIAGNOSIS — D61810 Antineoplastic chemotherapy induced pancytopenia: Secondary | ICD-10-CM

## 2013-10-11 DIAGNOSIS — I739 Peripheral vascular disease, unspecified: Secondary | ICD-10-CM | POA: Diagnosis present

## 2013-10-11 DIAGNOSIS — C799 Secondary malignant neoplasm of unspecified site: Secondary | ICD-10-CM

## 2013-10-11 DIAGNOSIS — E876 Hypokalemia: Secondary | ICD-10-CM | POA: Diagnosis present

## 2013-10-11 DIAGNOSIS — M1A9XX1 Chronic gout, unspecified, with tophus (tophi): Secondary | ICD-10-CM | POA: Diagnosis present

## 2013-10-11 DIAGNOSIS — N184 Chronic kidney disease, stage 4 (severe): Secondary | ICD-10-CM | POA: Diagnosis present

## 2013-10-11 DIAGNOSIS — D61818 Other pancytopenia: Secondary | ICD-10-CM

## 2013-10-11 DIAGNOSIS — I5022 Chronic systolic (congestive) heart failure: Secondary | ICD-10-CM

## 2013-10-11 DIAGNOSIS — T451X5A Adverse effect of antineoplastic and immunosuppressive drugs, initial encounter: Principal | ICD-10-CM

## 2013-10-11 DIAGNOSIS — I129 Hypertensive chronic kidney disease with stage 1 through stage 4 chronic kidney disease, or unspecified chronic kidney disease: Secondary | ICD-10-CM | POA: Diagnosis present

## 2013-10-11 DIAGNOSIS — N189 Chronic kidney disease, unspecified: Secondary | ICD-10-CM

## 2013-10-11 DIAGNOSIS — I059 Rheumatic mitral valve disease, unspecified: Secondary | ICD-10-CM | POA: Diagnosis present

## 2013-10-11 DIAGNOSIS — I252 Old myocardial infarction: Secondary | ICD-10-CM

## 2013-10-11 DIAGNOSIS — Z9049 Acquired absence of other specified parts of digestive tract: Secondary | ICD-10-CM

## 2013-10-11 DIAGNOSIS — D62 Acute posthemorrhagic anemia: Secondary | ICD-10-CM

## 2013-10-11 DIAGNOSIS — C3490 Malignant neoplasm of unspecified part of unspecified bronchus or lung: Secondary | ICD-10-CM | POA: Diagnosis present

## 2013-10-11 DIAGNOSIS — Z932 Ileostomy status: Secondary | ICD-10-CM

## 2013-10-11 DIAGNOSIS — D630 Anemia in neoplastic disease: Secondary | ICD-10-CM | POA: Diagnosis present

## 2013-10-11 DIAGNOSIS — E860 Dehydration: Secondary | ICD-10-CM

## 2013-10-11 DIAGNOSIS — E785 Hyperlipidemia, unspecified: Secondary | ICD-10-CM | POA: Diagnosis present

## 2013-10-11 DIAGNOSIS — Z9221 Personal history of antineoplastic chemotherapy: Secondary | ICD-10-CM

## 2013-10-11 DIAGNOSIS — M1A00X1 Idiopathic chronic gout, unspecified site, with tophus (tophi): Secondary | ICD-10-CM | POA: Diagnosis present

## 2013-10-11 DIAGNOSIS — C801 Malignant (primary) neoplasm, unspecified: Secondary | ICD-10-CM | POA: Diagnosis present

## 2013-10-11 LAB — BASIC METABOLIC PANEL
BUN: 39 mg/dL — ABNORMAL HIGH (ref 6–23)
Calcium: 9.1 mg/dL (ref 8.4–10.5)
GFR calc non Af Amer: 28 mL/min — ABNORMAL LOW (ref >90)
Glucose, Bld: 104 mg/dL — ABNORMAL HIGH (ref 70–99)

## 2013-10-11 LAB — CBC
HCT: 24.7 % — ABNORMAL LOW (ref 39.0–52.0)
Hemoglobin: 8.1 g/dL — ABNORMAL LOW (ref 13.0–17.0)
MCH: 30.3 pg (ref 26.0–34.0)
MCHC: 32.8 g/dL (ref 30.0–36.0)

## 2013-10-11 LAB — HEMOGLOBIN: Hemoglobin: 7.5 g/dL — ABNORMAL LOW (ref 13.0–17.0)

## 2013-10-11 LAB — PROTIME-INR: Prothrombin Time: 35.6 seconds — ABNORMAL HIGH (ref 11.6–15.2)

## 2013-10-11 MED ORDER — FERROUS SULFATE 325 (65 FE) MG PO TABS
325.0000 mg | ORAL_TABLET | Freq: Every evening | ORAL | Status: DC
  Administered 2013-10-11 – 2013-10-14 (×4): 325 mg via ORAL
  Filled 2013-10-11 (×5): qty 1

## 2013-10-11 MED ORDER — PHYTONADIONE 5 MG PO TABS: 2.5000 mg | ORAL_TABLET | Freq: Once | ORAL | Status: DC

## 2013-10-11 MED ORDER — ACETAMINOPHEN 325 MG PO TABS: 650.0000 mg | ORAL_TABLET | Freq: Four times a day (QID) | ORAL | Status: DC | PRN

## 2013-10-11 MED ORDER — SODIUM CHLORIDE 0.9 % IV SOLN: INTRAVENOUS | Status: AC

## 2013-10-11 MED ORDER — SODIUM CHLORIDE 0.9 % IV BOLUS (SEPSIS)
500.0000 mL | Freq: Once | INTRAVENOUS | Status: AC
  Administered 2013-10-11: 500 mL via INTRAVENOUS

## 2013-10-11 MED ORDER — MORPHINE SULFATE 2 MG/ML IJ SOLN: 2.0000 mg | INTRAMUSCULAR | Status: DC | PRN

## 2013-10-11 MED ORDER — ONDANSETRON HCL 4 MG/2ML IJ SOLN: 4.0000 mg | Freq: Four times a day (QID) | INTRAMUSCULAR | Status: DC | PRN

## 2013-10-11 MED ORDER — ATORVASTATIN CALCIUM 20 MG PO TABS
20.0000 mg | ORAL_TABLET | Freq: Every evening | ORAL | Status: DC
  Administered 2013-10-11 – 2013-10-14 (×3): 20 mg via ORAL
  Filled 2013-10-11 (×5): qty 1

## 2013-10-11 MED ORDER — BIOTENE DRY MOUTH MT LIQD: 15.0000 mL | OROMUCOSAL | Status: DC | PRN

## 2013-10-11 MED ORDER — TAMSULOSIN HCL 0.4 MG PO CAPS
0.4000 mg | ORAL_CAPSULE | ORAL | Status: DC
  Administered 2013-10-11 – 2013-10-15 (×3): 0.4 mg via ORAL
  Filled 2013-10-11 (×3): qty 1

## 2013-10-11 MED ORDER — PHYTONADIONE 5 MG PO TABS
5.0000 mg | ORAL_TABLET | Freq: Once | ORAL | Status: AC
  Administered 2013-10-11: 5 mg via ORAL
  Filled 2013-10-11: qty 1

## 2013-10-11 MED ORDER — ONDANSETRON HCL 4 MG PO TABS: 4.0000 mg | ORAL_TABLET | Freq: Four times a day (QID) | ORAL | Status: DC | PRN

## 2013-10-11 MED ORDER — ALLOPURINOL 100 MG PO TABS
100.0000 mg | ORAL_TABLET | Freq: Every day | ORAL | Status: DC
  Administered 2013-10-11 – 2013-10-14 (×4): 100 mg via ORAL
  Filled 2013-10-11 (×5): qty 1

## 2013-10-11 MED ORDER — NITROGLYCERIN 0.4 MG SL SUBL: 0.4000 mg | SUBLINGUAL_TABLET | SUBLINGUAL | Status: DC | PRN

## 2013-10-11 MED ORDER — OXYMETAZOLINE HCL 0.05 % NA SOLN
2.0000 | Freq: Two times a day (BID) | NASAL | Status: DC | PRN
  Administered 2013-10-11 – 2013-10-12 (×2): 2 via NASAL
  Filled 2013-10-11: qty 15

## 2013-10-11 MED ORDER — CARVEDILOL 3.125 MG PO TABS
3.1250 mg | ORAL_TABLET | Freq: Two times a day (BID) | ORAL | Status: DC
  Administered 2013-10-11 – 2013-10-15 (×7): 3.125 mg via ORAL
  Filled 2013-10-11 (×10): qty 1

## 2013-10-11 MED ORDER — AMIODARONE HCL 100 MG PO TABS
100.0000 mg | ORAL_TABLET | Freq: Every day | ORAL | Status: DC
  Administered 2013-10-12 – 2013-10-15 (×4): 100 mg via ORAL
  Filled 2013-10-11 (×4): qty 1

## 2013-10-11 MED ORDER — ACETAMINOPHEN 650 MG RE SUPP: 650.0000 mg | Freq: Four times a day (QID) | RECTAL | Status: DC | PRN

## 2013-10-11 MED ORDER — FOLIC ACID 0.5 MG HALF TAB
500.0000 ug | ORAL_TABLET | Freq: Every day | ORAL | Status: DC
  Administered 2013-10-12 – 2013-10-15 (×4): 0.5 mg via ORAL
  Filled 2013-10-11 (×4): qty 1

## 2013-10-11 MED ORDER — SODIUM CHLORIDE 0.9 % IV SOLN
INTRAVENOUS | Status: DC
  Administered 2013-10-11 – 2013-10-13 (×4): via INTRAVENOUS

## 2013-10-11 MED ORDER — ISOSORBIDE MONONITRATE ER 30 MG PO TB24
30.0000 mg | ORAL_TABLET | Freq: Every evening | ORAL | Status: DC
  Administered 2013-10-11 – 2013-10-12 (×2): 30 mg via ORAL
  Filled 2013-10-11 (×3): qty 1

## 2013-10-11 MED ORDER — SILVER NITRATE-POT NITRATE 75-25 % EX MISC
CUTANEOUS | Status: AC
  Filled 2013-10-11: qty 1

## 2013-10-11 NOTE — Progress Notes (Addendum)
Upon platelets finishing flushing, patient began complaining of chills. Vital signs taken, temp 31F (was 98.5 before transfusion began). HR 117. No complaint of back pain, SOB, nausea. Elray Mcgregor, NP notified. Stated was not transfusion reaction and to monitor patient for any changes.   Patient given blankets and within 15 minutes, complaints of chill gone.

## 2013-10-11 NOTE — H&P (Signed)
[facility] History and Physical  [name] [name]:[id number] DOB: 08/04/1930 DOA: 10/11/2013  Referring physician: Dr. Feliciana Rossetti  PCP: [name], MD  Specialists:   Chief Complaint: Nosebleed  HPI: [name] [name] is a 79 y.o. male  With a hx of metastatic adenocarcinoma of the lung s/p recent chemo and afib on coumadin who presented to [facility] with multiple episodes of epistaxis. Pt was noted to have a plt count of 16 with a supertherapeutic INR of 3.7. He was also noted to be anemic with a hgb of 8.1. Given acute blood loss anemia associated with thrombocytopenia and supertherapeutic INR, the patient was transferred to [facility] for further workup and treatment. The Oncology service was contacted prior to transfer.  Review of Systems: Per above, the remainder of the 10pt ros reviewed and are neg   Past Medical History  Diagnosis Date  . Coronary artery disease   . MI, old     AGE 59  . Ulcerative colitis   . Hyperlipidemia   . Hypertension   . History of nephrolithiasis   . PAF (paroxysmal atrial fibrillation)   . LV dysfunction   . Myocardial infarction   . Anemia   . Blood transfusion   . Arthritis   . CHF (congestive heart failure)   . Mitral regurgitation   . Pneumonia     years ago  . Peripheral vascular disease   . 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 Acute renal failure     "related ? Kidney stone blockage"  . Gout flare     right index finger-tx. prednisone, allopurinol  . Swelling 05-08-13    rt. base of neck-something recent.  . Cancer 05/29/13    metastatic adenocarcinoma of right neck mass  . Metastatic adenocarcinoma 05/29/13    right supraclavicular area   Past Surgical History  Procedure Laterality Date  . Cardiac catheterization  10/14/2010    EF 30%  . Coronary artery bypass graft  10/24/2010    LIMA TO THE LAD, A SAPHENOUS VEIN GRAFT TO THE DIAGONAL 1 AND 3, AND SAPHENOUS VEIN GRAFT TO THE DISTAL RIGHT CORONARY ARTERY  . Ileostomy   04/23/73  . Colectomy    . Cataract extraction    . Transthoracic echocardiogram  01/16/2011    EF 50%  . Cardiovascular stress test  10/07/2010    EF 33%  . Appendectomy    . Cystostomy  04/22/2012    Procedure: CYSTOSTOMY SUPRAPUBIC;  Surgeon: Garnett Farm, MD;  Location: [facility] ORS;  Service: Urology;  Laterality: N/A;  CYSTOTOMY WITH MIDLINE INCISION   . Tee without cardioversion  08/28/2012    Procedure: TRANSESOPHAGEAL ECHOCARDIOGRAM (TEE);  Surgeon: [name], MD;  Location: [facility] ENDOSCOPY;  Service: Cardiovascular;  Laterality: N/A;  . Cardioversion  08/28/2012    Procedure: CARDIOVERSION;  Surgeon: [name], MD;  Location: [facility] ENDOSCOPY;  Service: Cardiovascular;  Laterality: N/A;  . Cystoscopy w/ ureteral stent placement Right 05/12/2013    Procedure: CYSTOSCOPY WITH RIGHT RETROGRADE PYELOGRAM/ RIGHT URETERAL STENT PLACEMENT;  Surgeon: Garnett Farm, MD;  Location: [facility] ORS;  Service: Urology;  Laterality: Right;   Social History:  reports that he quit smoking about 44 years ago. His smoking use included Cigarettes and Cigars. He has a 15 pack-year smoking history. He does not have any smokeless tobacco history on file. He reports that he does not drink alcohol or use illicit drugs.  where does patient live--home, ALF, SNF? and with whom  if at home?  Can patient participate in ADLs?  No Known Allergies  Family History  Problem Relation Age of Onset  . Colon cancer Mother     colon  . Heart attack Father   . Hypertension Father   . Heart disease Sister   . Hypertension Sister   . Heart disease Brother   . Hypertension Brother   . Heart disease Sister   . Hypertension Sister   . Heart disease Sister   . Hypertension Sister   . Heart disease Brother   . Hypertension Brother   . Heart disease Brother   . Hypertension Brother   . Heart disease Brother   . Hypertension Brother   . Heart disease Brother   . Hypertension Brother   . Heart disease Brother   .  Hypertension Brother   . Heart disease Brother   . Hypertension Brother   . Heart disease Brother   . Hypertension Brother   . Stroke Brother     (be sure to complete)  Prior to Admission medications   Medication Sig Start Date End Date Taking? Authorizing Provider  allopurinol (ZYLOPRIM) 100 MG tablet TAKE 1 TABLET BY MOUTH DAILY 10/03/13   Agapito Games, MD  amiodarone (PACERONE) 200 MG tablet Take 0.5 tablets (100 mg total) by mouth daily. 09/01/13   [name], MD  atorvastatin (LIPITOR) 40 MG tablet Take 20 mg by mouth every evening. 1/2 tablet daily 07/21/13   [name], MD  carvedilol (COREG) 6.25 MG tablet Take 3.125 mg by mouth daily.     Historical Provider, MD  cholecalciferol (VITAMIN D) 1000 UNITS tablet Take 1,000 Units by mouth daily.    Historical Provider, MD  ferrous sulfate 325 (65 FE) MG tablet Take 325 mg by mouth every evening.    Historical Provider, MD  fish oil-omega-3 fatty acids 1000 MG capsule Take 1 g by mouth 2 (two) times daily.     Historical Provider, MD  FLAXSEED, LINSEED, PO Take 1 tablet by mouth at bedtime.     Historical Provider, MD  folic acid (FOLVITE) 400 MCG tablet Take 400 mcg by mouth daily.     Historical Provider, MD  isosorbide mononitrate (IMDUR) 60 MG 24 hr tablet Take 30 mg by mouth every evening.     Historical Provider, MD  nitroGLYCERIN (NITROSTAT) 0.4 MG SL tablet Place 1 tablet (0.4 mg total) under the tongue every 5 (five) minutes as needed for chest pain. 06/03/13   Agapito Games, MD  potassium chloride SA (K-DUR,KLOR-CON) 20 MEQ tablet Take 10 mEq by mouth daily.    Historical Provider, MD  Tamsulosin HCl (FLOMAX) 0.4 MG CAPS Take 0.4 mg by mouth every other day.     Historical Provider, MD  warfarin (COUMADIN) 5 MG tablet Take 2.5-5 mg by mouth at bedtime. As directed 09/10/13   Agapito Games, MD   Physical Exam: Filed Vitals:   10/11/13 1018 10/11/13 1324  BP: 105/79 93/59  Pulse: 97 110  Temp: 98 F  (36.7 C)   TempSrc: Oral   Resp: 16 17  Height: 5\' 4"  (1.626 m)   Weight: 63.957 kg (141 lb)   SpO2: 100%      General:  Awake, in nad  Eyes: PERRL B  ENT: membranes moist, dentition fair  Neck: trachea midline, neck supple  Cardiovascular: regular, s1, s2  Respiratory: normal resp effort, no wheezing  Abdomen: soft, nondistended  Skin: multiple patches of ecchymosis, normal skin  turgor  Musculoskeletal: perfused, no clubbing  Psychiatric: mood/affect normal // no auditory/visual hallucinations  Neurologic: cn2-12 grossly intact, strength/sensation intact  Labs on Admission:  Basic Metabolic Panel:  Recent Labs Lab 10/11/13 1100  NA 138  K 5.1  CL 110  CO2 19  GLUCOSE 104*  BUN 39*  CREATININE 2.10*  CALCIUM 9.1   Liver Function Tests: No results found for this basename: AST, ALT, ALKPHOS, BILITOT, PROT, ALBUMIN,  in the last 168 hours No results found for this basename: LIPASE, AMYLASE,  in the last 168 hours No results found for this basename: AMMONIA,  in the last 168 hours CBC:  Recent Labs Lab 10/11/13 1100  WBC 1.2*  HGB 8.1*  HCT 24.7*  MCV 92.5  PLT 16*   Cardiac Enzymes: No results found for this basename: CKTOTAL, CKMB, CKMBINDEX, TROPONINI,  in the last 168 hours  BNP (last 3 results)  Recent Labs  10/12/12 1212 03/05/13 1208  PROBNP 5468.0* 557.0*   CBG: No results found for this basename: GLUCAP,  in the last 168 hours  Radiological Exams on Admission: No results found.  Assessment/Plan Principal Problem:   Acute blood loss anemia Active Problems:   HYPERLIPIDEMIA   Essential hypertension, benign   CAD   Chronic systolic CHF (congestive heart failure)   CKD (chronic kidney disease)   Paroxysmal atrial fibrillation   Metastatic adenocarcinoma   Thrombocytopenia, unspecified   Epistaxis   1. Acute blood loss anemia secondary to epistaxis 1. Hgb noted to be just over 8 2. Pt is thrombocytopenic with a  supertherapeutic INR 3. Will hold anticoagulant and transfuse platlets (see below) 4. Admit to med floor 5. Heme-Onc has been consulted for further recs 2. Thrombocytopenia 1. Plt of 16 with active bleed 2. Will transfuse 1 unit of plts 3. Follow cbc 3. Hx CAD 1. Stable. 2. Asymptomatic 3. Cont meds 4. Hx afib 1. Currently rate controled.  2. Hold anticoagulant given supertherapeutic INR w/ low plts 5. Stage 4 CKD 1. GFR around 30 2. Follow renal fx closely 3. Avoid nephrotoxic drugs 4. Remain hydrated 5. Follows Nephrology as an outpatient 6. Metastatic adenocarcinoma 1. S/p treatment 2. Will follow 7. Epistaxis 1. Currently stopped 2. Keep pressure if re-bleeds 3. Consider afrin spray PRN for nosebleeds 8. DVT prophylaxis 1. SCD's  Code Status: Full (must indicate code status--if unknown or must be presumed, indicate so) Family Communication: Pt in room (indicate person spoken with, if applicable, with phone number if by telephone) Disposition Plan: Pending (indicate anticipated LOS)  Time spent:  CHIU, STEPHEN K [facility] Pager [phone]  If 7PM-7AM, please contact night-coverage [email or web address] Password [email or web address] 10/11/2013, 3:12 PM

## 2013-10-11 NOTE — ED Provider Notes (Addendum)
CSN: [id number]     Arrival date & time 10/11/13  1011 History   First MD Initiated Contact with Patient 10/11/13 1017     Chief Complaint  Patient presents with  . Epistaxis   (Consider location/radiation/quality/duration/timing/severity/associated sxs/prior Treatment) HPI Comments: 79 yo male with CAD, Adenocarcinoma with metastasis, a fib on coumadin presents with epistaxis for two days, intermittent, no recent INR, pt taking coumadin as directed.  Mild lightheaded with standing.  Pt feels okay otherwise.  No hx of recent epistaxis, mild oozing, worse with blowing nose. Last chemo 2 wks prior.  Pt has received RBC in the past, never required platelets.   Patient is a 79 y.o. male presenting with nosebleeds. The history is provided by the patient.  Epistaxis Associated symptoms: no fever and no headaches     Past Medical History  Diagnosis Date  . Coronary artery disease   . MI, old     AGE 63  . Ulcerative colitis   . Hyperlipidemia   . Hypertension   . History of nephrolithiasis   . PAF (paroxysmal atrial fibrillation)   . LV dysfunction   . Myocardial infarction   . Anemia   . Blood transfusion   . Arthritis   . CHF (congestive heart failure)   . Mitral regurgitation   . Pneumonia     years ago  . Peripheral vascular disease   . 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 Acute renal failure     "related ? Kidney stone blockage"  . Gout flare     right index finger-tx. prednisone, allopurinol  . Swelling 05-08-13    rt. base of neck-something recent.  . Cancer 05/29/13    metastatic adenocarcinoma of right neck mass  . Metastatic adenocarcinoma 05/29/13    right supraclavicular area   Past Surgical History  Procedure Laterality Date  . Cardiac catheterization  10/14/2010    EF 30%  . Coronary artery bypass graft  10/24/2010    LIMA TO THE LAD, A SAPHENOUS VEIN GRAFT TO THE DIAGONAL 1 AND 3, AND SAPHENOUS VEIN GRAFT TO THE DISTAL RIGHT CORONARY ARTERY  .  Ileostomy  04/23/73  . Colectomy    . Cataract extraction    . Transthoracic echocardiogram  01/16/2011    EF 50%  . Cardiovascular stress test  10/07/2010    EF 33%  . Appendectomy    . Cystostomy  04/22/2012    Procedure: CYSTOSTOMY SUPRAPUBIC;  Surgeon: Garnett Farm, MD;  Location: [facility] ORS;  Service: Urology;  Laterality: N/A;  CYSTOTOMY WITH MIDLINE INCISION   . Tee without cardioversion  08/28/2012    Procedure: TRANSESOPHAGEAL ECHOCARDIOGRAM (TEE);  Surgeon: [name], MD;  Location: [facility] ENDOSCOPY;  Service: Cardiovascular;  Laterality: N/A;  . Cardioversion  08/28/2012    Procedure: CARDIOVERSION;  Surgeon: [name], MD;  Location: [facility] ENDOSCOPY;  Service: Cardiovascular;  Laterality: N/A;  . Cystoscopy w/ ureteral stent placement Right 05/12/2013    Procedure: CYSTOSCOPY WITH RIGHT RETROGRADE PYELOGRAM/ RIGHT URETERAL STENT PLACEMENT;  Surgeon: Garnett Farm, MD;  Location: [facility] ORS;  Service: Urology;  Laterality: Right;   Family History  Problem Relation Age of Onset  . Colon cancer Mother     colon  . Heart attack Father   . Hypertension Father   . Heart disease Sister   . Hypertension Sister   . Heart disease Brother   . Hypertension Brother   . Heart disease Sister   .  Hypertension Sister   . Heart disease Sister   . Hypertension Sister   . Heart disease Brother   . Hypertension Brother   . Heart disease Brother   . Hypertension Brother   . Heart disease Brother   . Hypertension Brother   . Heart disease Brother   . Hypertension Brother   . Heart disease Brother   . Hypertension Brother   . Heart disease Brother   . Hypertension Brother   . Heart disease Brother   . Hypertension Brother   . Stroke Brother    History  Substance Use Topics  . Smoking status: Former Smoker -- 1.00 packs/day for 15 years    Types: Cigarettes, Cigars    Quit date: 12/25/1968  . Smokeless tobacco: Not on file  . Alcohol Use: No     Comment: occasional glass of wine     Review of Systems  Constitutional: Negative for fever and chills.  HENT: Positive for nosebleeds.   Respiratory: Negative for shortness of breath.   Cardiovascular: Negative for chest pain.  Musculoskeletal: Negative for back pain.  Neurological: Positive for light-headedness. Negative for headaches.    Allergies  Review of patient's allergies indicates no known allergies.  Home Medications   Current Outpatient Rx  Name  Route  Sig  Dispense  Refill  . allopurinol (ZYLOPRIM) 100 MG tablet      TAKE 1 TABLET BY MOUTH DAILY   90 tablet   1   . amiodarone (PACERONE) 200 MG tablet   Oral   Take 0.5 tablets (100 mg total) by mouth daily.   30 tablet   1   . atorvastatin (LIPITOR) 40 MG tablet   Oral   Take 20 mg by mouth every evening. 1/2 tablet daily         . carvedilol (COREG) 6.25 MG tablet   Oral   Take 3.125 mg by mouth daily.          . cholecalciferol (VITAMIN D) 1000 UNITS tablet   Oral   Take 1,000 Units by mouth daily.         . ferrous sulfate 325 (65 FE) MG tablet   Oral   Take 325 mg by mouth every evening.         . fish oil-omega-3 fatty acids 1000 MG capsule   Oral   Take 1 g by mouth 2 (two) times daily.          [name] FLAXSEED, LINSEED, PO   Oral   Take 1 tablet by mouth at bedtime.          . folic acid (FOLVITE) 400 MCG tablet   Oral   Take 400 mcg by mouth daily.          . isosorbide mononitrate (IMDUR) 60 MG 24 hr tablet   Oral   Take 30 mg by mouth every evening.          . nitroGLYCERIN (NITROSTAT) 0.4 MG SL tablet   Sublingual   Place 1 tablet (0.4 mg total) under the tongue every 5 (five) minutes as needed for chest pain.   30 tablet   0   . potassium chloride SA (K-DUR,KLOR-CON) 20 MEQ tablet   Oral   Take 10 mEq by mouth daily.         . Tamsulosin HCl (FLOMAX) 0.4 MG CAPS   Oral   Take 0.4 mg by mouth every other day.          [name]  warfarin (COUMADIN) 5 MG tablet   Oral   Take 2.5-5 mg by mouth  at bedtime. As directed          BP 105/79  Pulse 97  Temp(Src) 98 F (36.7 C) (Oral)  Resp 16  Ht 5\' 4"  (1.626 m)  Wt 141 lb (63.957 kg)  BMI 24.19 kg/m2  SpO2 100% Physical Exam  Nursing note and vitals reviewed. Constitutional: He appears well-developed and well-nourished. No distress.  HENT:  Head: Normocephalic and atraumatic.  Oozing site from bilateral anterior septum, mild, no clots, no lacerations, mild dry mm  Eyes: Conjunctivae are normal. Pupils are equal, round, and reactive to light.  Neck: Normal range of motion. Neck supple. No JVD present.  Cardiovascular: Normal rate.   Pulmonary/Chest: Effort normal.  Neurological: He is alert.  Skin: Skin is warm. There is pallor.  Petechia/ superficial macules on LE bilateral anterior    ED Course  Procedures (including critical care time) Epistaxis Patient blew nares. Bleeding sites identified. Silver nitrate used on two small areas. Bleeding improved. No complications.  Labs Review Labs Reviewed  PROTIME-INR - Abnormal; Notable for the following:    Prothrombin Time 35.6 (*)    INR 3.74 (*)    All other components within normal limits  CBC - Abnormal; Notable for the following:    WBC 1.2 (*)    RBC 2.67 (*)    Hemoglobin 8.1 (*)    HCT 24.7 (*)    RDW 17.9 (*)    Platelets 16 (*)    All other components within normal limits  BASIC METABOLIC PANEL - Abnormal; Notable for the following:    Glucose, Bld 104 (*)    BUN 39 (*)    Creatinine, Ser 2.10 (*)    GFR calc non Af Amer 28 (*)    GFR calc Af Amer 33 (*)    All other components within normal limits  TYPE AND SCREEN   Imaging Review No results found.  EKG Interpretation   None       MDM  No diagnosis found.  Bleeding controlled in ED with silver nitrate. Observed, no recurrent bleeding on recheck. Pancytopenia with platelets 16 K.  Discussed with pt, plan for oncology admission at [place] for blood and platelet transfusion/  observation.  Spoke with Oncology Dr Welton Flakes, agreed with transfer to [facility] under hospitalist for platelet and blood transfusion. Updated patient. Pt stable for transfer.  Holding on acute blood transfusion or acute reversal of coumadin as patient bleeding controlled.  PO vit K given with increased INR and bleeding.  The patients results and plan were reviewed and discussed.   Any x-rays performed were personally reviewed by myself.   Differential diagnosis were considered with the presenting HPI.  Diagnosis: Pancytopenia, Epistaxis, CRF, Dehydration, abnormal INR  Admission/ observation were discussed with the admitting physician, patient and/or family and they are comfortable with the plan.    Enid Skeens, MD 10/11/13 1244  Enid Skeens, MD 10/11/13 [id number]

## 2013-10-11 NOTE — ED Notes (Signed)
Patient reports that he developed nosebleed sometime early this am from both nares. On arrival no bleeding noted. On coumadin and INR was 2.8 12 days ago.  No congestion or cough

## 2013-10-12 DIAGNOSIS — I5022 Chronic systolic (congestive) heart failure: Secondary | ICD-10-CM | POA: Diagnosis present

## 2013-10-12 DIAGNOSIS — C801 Malignant (primary) neoplasm, unspecified: Secondary | ICD-10-CM

## 2013-10-12 DIAGNOSIS — T451X5A Adverse effect of antineoplastic and immunosuppressive drugs, initial encounter: Secondary | ICD-10-CM

## 2013-10-12 DIAGNOSIS — D638 Anemia in other chronic diseases classified elsewhere: Secondary | ICD-10-CM

## 2013-10-12 DIAGNOSIS — D61810 Antineoplastic chemotherapy induced pancytopenia: Principal | ICD-10-CM

## 2013-10-12 LAB — CBC
HCT: 20.7 % — ABNORMAL LOW (ref 39.0–52.0)
HCT: 22 % — ABNORMAL LOW (ref 39.0–52.0)
Hemoglobin: 7.1 g/dL — ABNORMAL LOW (ref 13.0–17.0)
MCHC: 32.9 g/dL (ref 30.0–36.0)
MCV: 91.2 fL (ref 78.0–100.0)
Platelets: 38 10*3/uL — ABNORMAL LOW (ref 150–400)
RBC: 2.41 MIL/uL — ABNORMAL LOW (ref 4.22–5.81)
RDW: 18.1 % — ABNORMAL HIGH (ref 11.5–15.5)
WBC: 0.7 10*3/uL — CL (ref 4.0–10.5)

## 2013-10-12 LAB — COMPREHENSIVE METABOLIC PANEL
AST: 16 U/L (ref 0–37)
Albumin: 2 g/dL — ABNORMAL LOW (ref 3.5–5.2)
BUN: 33 mg/dL — ABNORMAL HIGH (ref 6–23)
Calcium: 8.1 mg/dL — ABNORMAL LOW (ref 8.4–10.5)
Creatinine, Ser: 1.9 mg/dL — ABNORMAL HIGH (ref 0.50–1.35)
Potassium: 4.6 mEq/L (ref 3.5–5.1)
Total Protein: 4.6 g/dL — ABNORMAL LOW (ref 6.0–8.3)

## 2013-10-12 LAB — PREPARE PLATELET PHERESIS: Unit division: 0

## 2013-10-12 LAB — PROTIME-INR
INR: 2.36 — ABNORMAL HIGH (ref 0.00–1.49)
Prothrombin Time: 25 seconds — ABNORMAL HIGH (ref 11.6–15.2)

## 2013-10-12 MED ORDER — FILGRASTIM 480 MCG/1.6ML IJ SOLN
480.0000 ug | Freq: Every day | INTRAMUSCULAR | Status: DC
  Administered 2013-10-12 – 2013-10-14 (×3): 480 ug via SUBCUTANEOUS
  Filled 2013-10-12 (×6): qty 1.6

## 2013-10-12 NOTE — Progress Notes (Signed)
CRITICAL VALUE ALERT  Critical value received:  WBC 0.5  Date of notification:  10/19  Time of notification:  0035  Critical value read back:YES  Nurse who received alert:  Jilda Panda  MD notified (1st page):  Daphane Shepherd, NP  Time of first page:  0040  MD notified (2nd page):  Time of second page:  Responding MD:  Daphane Shepherd, NP  Time MD responded:  [phone]

## 2013-10-12 NOTE — Progress Notes (Signed)
[facility] PROGRESS NOTE  [name] [name] AVW:[id number] DOB: 02-07-1930 DOA: 10/11/2013 PCP: [name], MD  Brief narrative: 79 year old male with past medical history of metastatic adenocarcinoma of the lung and status post 1 cycle of carboplatin/alimta (under Dr. Gustavo Lah care), hypertension, atrial fibrillation on coumadin, systolic CHF who presented to [facility] ED 10/11/2013 with complaints of epistaxis, multiple episodes. In ED, vitals were stable but platelet count was 16 and INR 3.7 (supratherapeutic). Last chemotherapy on 10/01/13.  Assessment/Plan:  Principal Problem:   Epistaxis and associated acute blood loss anemia in the setting of thrombocytopenia - platelet count 16 on admission and status post 1 unit platelets 38 - continue to monitor for bleeds - transfuse 1 unit PRBC as Hgb dropped from 8.1 to 6.8  Active Problems:   Metastatic adenocarcinoma - status post 1 cycle chemo, carboplatin and alimta on 10/8   Antineoplastic chemotherapy induced pancytopenia - transfused 1 unit platelet on admission - will transfuse 1 unit PRBC today - started Neupogen for WBC count of 0.7   Anemia of chronic disease - secondary to history of malignancy and sequela of chemotherapy - hemoglobin 8.1 on admission and now 6.8 - transfuse 1 unit PRBC today - follow up CBC in am   HYPERLIPIDEMIA - continue statin therapy   Essential hypertension, benign - continue imdur   Chronic systolic CHF (congestive heart failure) - last 2 D ECHO in 04/2013 showed EF of 50% - compensated   CKD (chronic kidney disease) - creatinine better on this admission than baseline of 2.7 in 08/2013 - creatinine on this admission 2.1   Paroxysmal atrial fibrillation - hold coumadin in the setting of thrombocytopenia and anemia and suprtherapeutic INR   Gouty tophi of joint - continue allopurinol   Code Status: full code Family Communication: wife at the bedside Disposition Plan: home when  stable  Manson Passey, MD  [facility] Pager [phone]  If 7PM-7AM, please contact night-coverage [email or web address] Password [email or web address] 10/12/2013, 11:35 AM   LOS: 1 day   Consultants:  None   Procedures:  None   Antibiotics:  None   HPI/Subjective: Feels little better this am.  Objective: Filed Vitals:   10/11/13 1950 10/11/13 2030 10/11/13 2140 10/12/13 0514  BP: 107/74 107/68  122/78  Pulse: 81 117 98 97  Temp: 98.2 F (36.8 C) 99 F (37.2 C) 97.7 F (36.5 C) 97.8 F (36.6 C)  TempSrc: Oral Oral Oral Oral  Resp: 16 20  18   Height:      Weight:      SpO2: 100% 94% 98% 100%    Intake/Output Summary (Last 24 hours) at 10/12/13 1135 Last data filed at 10/12/13 0900  Gross per 24 hour  Intake 1627.5 ml  Output      0 ml  Net 1627.5 ml    Exam:   General:  Pt is alert, follows commands appropriately, not in acute distress  Cardiovascular: Regular rate and rhythm, S1/S2 appreciated, systolic ejection murmur apprecaited  Respiratory: Clear to auscultation bilaterally, no wheezing, no crackles, no rhonchi  Abdomen: Soft, non tender, non distended, bowel sounds present, no guarding  Extremities: No edema, pulses DP and PT palpable bilaterally  Neuro: Grossly nonfocal  Data Reviewed: Basic Metabolic Panel:  Recent Labs Lab 10/11/13 1100 10/12/13 0500  NA 138 137  K 5.1 4.6  CL 110 111  CO2 19 18*  GLUCOSE 104* 83  BUN 39* 33*  CREATININE 2.10* 1.90*  CALCIUM 9.1 8.1*   Liver Function Tests:  Recent  Labs Lab 10/12/13 0500  AST 16  ALT 16  ALKPHOS 58  BILITOT 0.6  PROT 4.6*  ALBUMIN 2.0*   No results found for this basename: LIPASE, AMYLASE,  in the last 168 hours No results found for this basename: AMMONIA,  in the last 168 hours CBC:  Recent Labs Lab 10/11/13 1100 10/11/13 1651 10/11/13 2040 10/11/13 2340 10/12/13 0500  WBC 1.2*  --   --  0.5* 0.7*  HGB 8.1* 7.5* 8.1* 7.1* 6.8*  HCT 24.7*  --   --  22.0* 20.7*  MCV 92.5   --   --  91.3 91.2  PLT 16*  --   --  33* 38*   Cardiac Enzymes: No results found for this basename: CKTOTAL, CKMB, CKMBINDEX, TROPONINI,  in the last 168 hours BNP: No components found with this basename: POCBNP,  CBG: No results found for this basename: GLUCAP,  in the last 168 hours  No results found for this or any previous visit (from the past 240 hour(s)).   Studies: No results found.  Scheduled Meds: . allopurinol  100 mg Oral QHS  . amiodarone  100 mg Oral Daily  . atorvastatin  20 mg Oral QPM  . carvedilol  3.125 mg Oral BID WC  . ferrous sulfate  325 mg Oral QPM  . filgrastim (NEUPOGEN)    480 mcg Subcutaneous q1800  . folic acid  500 mcg Oral Daily  . isosorbide mononitrate  30 mg Oral QPM  . tamsulosin  0.4 mg Oral QODAY   Continuous Infusions: . sodium chloride 75 mL/hr at 10/11/13 2141

## 2013-10-12 NOTE — Progress Notes (Signed)
CRITICAL VALUE ALERT  Critical value received: hgb 6.8  Date of notification:  10/19  Time of notification:  0600  Critical value read back:yes  Nurse who received alert:  Reeva Davern,Aynsley Fleet  MD notified (1st page):  M .Burnadette Peter ,NP  Time of first page:  [phone]  MD notified (2nd page):  Time of second page:  Responding MD:  M . Burnadette Peter  Time MD responded:  [phone]

## 2013-10-13 ENCOUNTER — Ambulatory Visit: Payer: Medicare Other | Admitting: *Deleted

## 2013-10-13 ENCOUNTER — Telehealth: Payer: Self-pay | Admitting: Family Medicine

## 2013-10-13 DIAGNOSIS — I5022 Chronic systolic (congestive) heart failure: Secondary | ICD-10-CM

## 2013-10-13 LAB — BASIC METABOLIC PANEL
BUN: 23 mg/dL (ref 6–23)
CO2: 18 mEq/L — ABNORMAL LOW (ref 19–32)
Chloride: 110 mEq/L (ref 96–112)
GFR calc Af Amer: 47 mL/min — ABNORMAL LOW (ref >90)
GFR calc non Af Amer: 40 mL/min — ABNORMAL LOW (ref >90)
Potassium: 3.7 mEq/L (ref 3.5–5.1)

## 2013-10-13 LAB — TYPE AND SCREEN
ABO/RH(D): O POS
Antibody Screen: NEGATIVE
Unit division: 0

## 2013-10-13 LAB — CBC
HCT: 24.9 % — ABNORMAL LOW (ref 39.0–52.0)
Hemoglobin: 8.2 g/dL — ABNORMAL LOW (ref 13.0–17.0)
MCV: 90.2 fL (ref 78.0–100.0)
RBC: 2.76 MIL/uL — ABNORMAL LOW (ref 4.22–5.81)
RDW: 17.4 % — ABNORMAL HIGH (ref 11.5–15.5)
WBC: 0.4 10*3/uL — CL (ref 4.0–10.5)

## 2013-10-13 LAB — PROTIME-INR: INR: 1.51 — ABNORMAL HIGH (ref 0.00–1.49)

## 2013-10-13 NOTE — Telephone Encounter (Signed)
FYI. Wife called. Husband is in the hospital at [facility].

## 2013-10-13 NOTE — Care Management Note (Signed)
Cm spoke with patient at bedside concerning discharge planning. Per pt currently active with [facility] services provided by [facility]. Per pt independent PTA, has RW for home use. No other needs identified at this time. Pt will require resumption of HHR[facility] services from [facility] prior to dc.    Roxy Manns Tifanie Gardiner,MSN,RN [phone]

## 2013-10-13 NOTE — Progress Notes (Addendum)
[facility] PROGRESS NOTE  [name] [name] RUE:[id number] DOB: [birth year] DOA: 10/11/2013 PCP: [name], MD  Brief narrative: 79 year old male with past medical history of metastatic adenocarcinoma of the lung and status post 1 cycle of carboplatin/alimta (under Dr. Gustavo Lah care), hypertension, atrial fibrillation on coumadin, systolic CHF who presented to [facility] ED 10/11/2013 with complaints of epistaxis, multiple episodes. In ED, vitals were stable but platelet count was 16 and INR 3.7 (supratherapeutic). Last chemotherapy on 10/01/13.   Assessment/Plan:   Principal Problem:  Epistaxis and associated acute blood loss anemia in the setting of thrombocytopenia  - platelet count 16 on admission and status post 1 unit platelets 38 --> 27 - no further epistaxis noted - due to hemoglobin of 6.8 we transfused 1 unit PRBC 10/12/2013 Active Problems:  Metastatic adenocarcinoma  - status post 1 cycle chemo, carboplatin and alimta on 10/8  Antineoplastic chemotherapy induced pancytopenia  - transfused 1 unit platelet on admission  - transfused 1 unit PRBC 10/12/2013 - started Neupogen for WBC count of 0.7; WBC count still low Anemia of chronic disease  - secondary to history of malignancy and sequela of chemotherapy  - hemoglobin 8.1 on admission and then 6.8  - transfused 1 unit PRBC on 10/12/2013 and post transfusion Hgb is 8.2 HYPERLIPIDEMIA  - continue statin therapy  Essential hypertension, benign  - continue coreg Chronic systolic CHF (congestive heart failure)  - last 2 D ECHO in 04/2013 showed EF of 50%  - compensated  CKD (chronic kidney disease)  - creatinine better on this admission than baseline of 2.7 in 08/2013  - creatinine on this admission 2.1 and now trending down to 1.51 Paroxysmal atrial fibrillation  - hold coumadin in the setting of thrombocytopenia and anemia and suprtherapeutic INR  Gouty tophi of joint  - continue allopurinol   Code Status: full  code  Family Communication: wife at the bedside  Disposition Plan: home when stable   Manson Passey, MD  [facility]  Pager [phone]   Consultants:  None  Procedures:  None  Antibiotics:  None    If 7PM-7AM, please contact night-coverage [email or web address] Password [email or web address] 10/13/2013, 1:28 PM   LOS: 2 days     HPI/Subjective: No epistaxis noted.  Objective: Filed Vitals:   10/12/13 1518 10/12/13 1530 10/12/13 2052 10/13/13 0611  BP:  110/74 120/79 137/72  Pulse:  103 106 95  Temp:  97.7 F (36.5 C) 98.1 F (36.7 C) 98.3 F (36.8 C)  TempSrc:  Oral Oral Oral  Resp:  18 18 18   Height:      Weight:      SpO2: 100%  100% 100%    Intake/Output Summary (Last 24 hours) at 10/13/13 1328 Last data filed at 10/13/13 1015  Gross per 24 hour  Intake   2482 ml  Output      0 ml  Net   2482 ml    Exam:   General:  Pt is alert, follows commands appropriately, not in acute distress  Cardiovascular: Regular rate and rhythm, S1/S2, no murmurs, no rubs, no gallops  Respiratory: Clear to auscultation bilaterally, no wheezing, no crackles, no rhonchi  Abdomen: Soft, non tender, non distended, bowel sounds present, no guarding  Extremities: No edema, pulses DP and PT palpable bilaterally  Neuro: Grossly nonfocal  Data Reviewed: Basic Metabolic Panel:  Recent Labs Lab 10/11/13 1100 10/12/13 0500 10/13/13 0610  NA 138 137 135  K 5.1 4.6 3.7  CL 110 111 110  CO2 19  18* 18*  GLUCOSE 104* 83 94  BUN 39* 33* 23  CREATININE 2.10* 1.90* 1.56*  CALCIUM 9.1 8.1* 8.0*   Liver Function Tests:  Recent Labs Lab 10/12/13 0500  AST 16  ALT 16  ALKPHOS 58  BILITOT 0.6  PROT 4.6*  ALBUMIN 2.0*   No results found for this basename: LIPASE, AMYLASE,  in the last 168 hours No results found for this basename: AMMONIA,  in the last 168 hours CBC:  Recent Labs Lab 10/11/13 1100 10/11/13 1651 10/11/13 2040 10/11/13 2340 10/12/13 0500 10/13/13 0610  WBC 1.2*   --   --  0.5* 0.7* 0.4*  HGB 8.1* 7.5* 8.1* 7.1* 6.8* 8.2*  HCT 24.7*  --   --  22.0* 20.7* 24.9*  MCV 92.5  --   --  91.3 91.2 90.2  PLT 16*  --   --  33* 38* 27*   Cardiac Enzymes: No results found for this basename: CKTOTAL, CKMB, CKMBINDEX, TROPONINI,  in the last 168 hours BNP: No components found with this basename: POCBNP,  CBG: No results found for this basename: GLUCAP,  in the last 168 hours  No results found for this or any previous visit (from the past 240 hour(s)).   Studies: No results found.  Scheduled Meds: . allopurinol  100 mg Oral QHS  . amiodarone  100 mg Oral Daily  . atorvastatin  20 mg Oral QPM  . carvedilol  3.125 mg Oral BID WC  . ferrous sulfate  325 mg Oral QPM  . filgrastim (NEUPOGEN)  SQ  480 mcg Subcutaneous q1800  . folic acid  500 mcg Oral Daily  . tamsulosin  0.4 mg Oral QODAY   Continuous Infusions: . sodium chloride 75 mL/hr at 10/13/13 0408

## 2013-10-14 ENCOUNTER — Encounter: Payer: Self-pay | Admitting: Hematology & Oncology

## 2013-10-14 DIAGNOSIS — C3490 Malignant neoplasm of unspecified part of unspecified bronchus or lung: Secondary | ICD-10-CM

## 2013-10-14 LAB — BASIC METABOLIC PANEL
CO2: 17 mEq/L — ABNORMAL LOW (ref 19–32)
Chloride: 107 mEq/L (ref 96–112)
Creatinine, Ser: 1.66 mg/dL — ABNORMAL HIGH (ref 0.50–1.35)
GFR calc Af Amer: 43 mL/min — ABNORMAL LOW (ref >90)
Potassium: 3.5 mEq/L (ref 3.5–5.1)
Sodium: 134 mEq/L — ABNORMAL LOW (ref 135–145)

## 2013-10-14 LAB — CBC
MCV: 89.1 fL (ref 78.0–100.0)
Platelets: 19 10*3/uL — CL (ref 150–400)
RBC: 2.84 MIL/uL — ABNORMAL LOW (ref 4.22–5.81)
WBC: 0.7 10*3/uL — CL (ref 4.0–10.5)

## 2013-10-14 LAB — PROTIME-INR: INR: 1.44 (ref 0.00–1.49)

## 2013-10-14 LAB — DIC (DISSEMINATED INTRAVASCULAR COAGULATION)PANEL
D-Dimer, Quant: 2.82 ug/mL-FEU — ABNORMAL HIGH (ref 0.00–0.48)
Fibrinogen: 447 mg/dL (ref 204–475)
INR: 1.33 (ref 0.00–1.49)
Prothrombin Time: 16.2 seconds — ABNORMAL HIGH (ref 11.6–15.2)

## 2013-10-14 LAB — DIC (DISSEMINATED INTRAVASCULAR COAGULATION) PANEL: aPTT: 55 seconds — ABNORMAL HIGH (ref 24–37)

## 2013-10-14 NOTE — Progress Notes (Signed)
[facility] PROGRESS NOTE  [name] [name] ZOX:[id number] DOB: April 09, 1930 DOA: 10/11/2013 PCP: [name], MD  Brief narrative: 79 year old male with past medical history of metastatic adenocarcinoma of the lung and status post 1 cycle of carboplatin/alimta (under Dr. Gustavo Lah care), hypertension, atrial fibrillation on coumadin, systolic CHF who presented to [facility] ED 10/11/2013 with complaints of epistaxis, multiple episodes. In ED, vitals were stable but platelet count was 16 and INR 3.7 (supratherapeutic). Last chemotherapy on 10/01/13. Hospital course is complicated due to ongoing neutropenia and thrombocytopenia.  Assessment/Plan:   Principal Problem:  Epistaxis and associated acute blood loss anemia in the setting of thrombocytopenia  - platelet count 16 on admission and status post 1 unit platelets 38 . Subsequent platelet count- trending down to 14 - no further epistaxis noted  - transfused 1 unit PRBC on10/19/2014. Post transfusion hemoglobin is 8.4 Active Problems:  Metastatic adenocarcinoma  - status post 1 cycle chemo, carboplatin and alimta on 10/8  - appreciate oncology following  - Dr. Clelia Croft to see the pt as Dr. Myna Hidalgo out of office today  Antineoplastic chemotherapy induced pancytopenia  - transfused 1 unit platelet on admission  - transfused 1 unit PRBC 10/12/2013  - started Neupogen for WBC count of 0.7; WBC count still low  Anemia of chronic disease  - secondary to history of malignancy and sequela of chemotherapy  - hemoglobin 8.1 on admission and then 6.8  - transfused 1 unit PRBC on 10/12/2013 and post transfusion Hgb is 8.2 --> 8.4 HYPERLIPIDEMIA  - continue statin therapy  Essential hypertension, benign  - continue coreg as BP tolerates Chronic systolic CHF (congestive heart failure)  - last 2 D ECHO in 04/2013 showed EF of 50%  - compensated  CKD (chronic kidney disease)  - creatinine better on this admission than baseline of 2.7 in 08/2013  -  creatinine on this admission 2.1 and now trending down to 1.66 Paroxysmal atrial fibrillation  - hold coumadin in the setting of thrombocytopenia and anemia and suprtherapeutic INR  Gouty tophi of joint  - continue allopurinol   Code Status: full code  Family Communication: wife at the bedside  Disposition Plan: home when stable   Manson Passey, MD  [facility]  Pager [phone]   Consultants:  Oncology Procedures:  None  Antibiotics:  None   If 7PM-7AM, please contact night-coverage [email or web address] Password [facility] 10/14/2013, 12:35 PM   LOS: 3 days    HPI/Subjective: No further bleeding noted.  Objective: Filed Vitals:   10/13/13 0611 10/13/13 1347 10/13/13 2026 10/14/13 0434  BP: 137/72 110/60 112/68 98/64  Pulse: 95 95 108 104  Temp: 98.3 F (36.8 C) 98 F (36.7 C) 99.2 F (37.3 C) 98.9 F (37.2 C)  TempSrc: Oral Oral  Oral  Resp: 18 16 16 18   Height:      Weight:      SpO2: 100% 100% 100% 100%    Intake/Output Summary (Last 24 hours) at 10/14/13 1235 Last data filed at 10/14/13 8295  Gross per 24 hour  Intake 2199.75 ml  Output      1 ml  Net 2198.75 ml    Exam:   General:  Pt is alert, follows commands appropriately, not in acute distress  Cardiovascular: Regular rate and rhythm, S1/S2 appreciated   Respiratory: Clear to auscultation bilaterally, no wheezing, no crackles, no rhonchi  Abdomen: Soft, non tender, non distended, bowel sounds present, colostomy ion place  Extremities: No edema, pulses DP and PT palpable bilaterally  Neuro:  Grossly nonfocal  Data Reviewed: Basic Metabolic Panel:  Recent Labs Lab 10/11/13 1100 10/12/13 0500 10/13/13 0610 10/14/13 0716  NA 138 137 135 134*  K 5.1 4.6 3.7 3.5  CL 110 111 110 107  CO2 19 18* 18* 17*  GLUCOSE 104* 83 94 81  BUN 39* 33* 23 19  CREATININE 2.10* 1.90* 1.56* 1.66*  CALCIUM 9.1 8.1* 8.0* 8.0*   Liver Function Tests:  Recent Labs Lab 10/12/13 0500  AST 16  ALT 16   ALKPHOS 58  BILITOT 0.6  PROT 4.6*  ALBUMIN 2.0*   No results found for this basename: LIPASE, AMYLASE,  in the last 168 hours No results found for this basename: AMMONIA,  in the last 168 hours CBC:  Recent Labs Lab 10/11/13 1100  10/11/13 2040 10/11/13 2340 10/12/13 0500 10/13/13 0610 10/14/13 0716 10/14/13 1130  WBC 1.2*  --   --  0.5* 0.7* 0.4* 0.7*  --   HGB 8.1*  < > 8.1* 7.1* 6.8* 8.2* 8.4*  --   HCT 24.7*  --   --  22.0* 20.7* 24.9* 25.3*  --   MCV 92.5  --   --  91.3 91.2 90.2 89.1  --   PLT 16*  --   --  33* 38* 27* 19* 14*  < > = values in this interval not displayed. Cardiac Enzymes: No results found for this basename: CKTOTAL, CKMB, CKMBINDEX, TROPONINI,  in the last 168 hours BNP: No components found with this basename: POCBNP,  CBG: No results found for this basename: GLUCAP,  in the last 168 hours  No results found for this or any previous visit (from the past 240 hour(s)).   Studies: No results found.  Scheduled Meds: . allopurinol  100 mg Oral QHS  . amiodarone  100 mg Oral Daily  . atorvastatin  20 mg Oral QPM  . carvedilol  3.125 mg Oral BID WC  . ferrous sulfate  325 mg Oral QPM  . filgrastim (NEUPOGEN)  SQ  480 mcg Subcutaneous q1800  . folic acid  500 mcg Oral Daily  . tamsulosin  0.4 mg Oral QODAY   Continuous Infusions: . sodium chloride 75 mL/hr at 10/13/13 1805

## 2013-10-14 NOTE — Progress Notes (Signed)
[facility]  Telephone:([phone]) [phone]    HOSPITAL PROGRESS NOTE [name] is a 79 y/o male patient of Dr. Myna Hidalgo, with a history of Metastatic adenocarcinoma of the lung, s/p C1 D1 on 09/03/2013, and   C2 D1  with carboplatin/Alimta last given on 10/01/2013.  At the time of his outpatient follow up visit, his count was as follows : White cell count is 11.4, hemoglobin 10.7, hematocrit 33 (after transfusion 2 weeks prior), platelet count 89,000.His chemotherapy dose was then reduced for C2 on 10/01/2013. Alimta at 300 mg/m2  1.76 m2 and Carboplatin 230 mg.  He was due to follow up at the [facility] in 6 weeks with a PET scan to determine therapy response.   On 10/11/13 he was admitted to [facility] with  Intermittent epistaxis for two days. He had been seen on 9/18 by PCP for this problem, and "glue" wa splaced on the areas of bleed. Of note, patient is on Coumadin for A fib, and INR had been supratherapeutic, being adjusted by PCP. No fever or headaches were reported.bilateral lower extremity echymoses was seen.   His H/H was 8.1 and 24.7  Hgb dropped from 8.1 to 6.8 and Hct 20.7 on 10/19 requiring 1 unit of PRBC.  with a rise of the values to 8.2 on 10/20 Current H/H is 07/28/24.3. Important to mention that he has anemia of chronic disease (renal insufficiency)  And Iron deficiency (on Aranesp q 21 d, last 9/29)   Platelets were 16k on admission,  He received 1 unit of platelets with response, rising to 33 on 10/18 and 38,000 on 10/19. ,as well as Vit K due to supratherapeutic INR 3.74. His platelet transfusion was  complicated by chills, temperature of 99 F, quickly resolved. Transfusion reaction was negative by chart report. His Platelets are again low at 19k today. No mental status changes or seizures reported. He states that no further bleed is noted.  No heparin or heparin products were received since admission.  WBC on admission was 1.2, then decreasing to 0.5 .Neupogen 480 mcg daily  was inittiated as well. Current count 0.7. ANC unavailable.   Smear ordered for review. We were requested to see the patient for evaluation of pancytopenia.   MEDICATIONS:  . allopurinol  100 mg Oral QHS  . amiodarone  100 mg Oral Daily  . atorvastatin  20 mg Oral QPM  . carvedilol  3.125 mg Oral BID WC  . ferrous sulfate  325 mg Oral QPM  . filgrastim (NEUPOGEN)  SQ  480 mcg Subcutaneous q1800  . folic acid  500 mcg Oral Daily  . tamsulosin  0.4 mg Oral QODAY   ALLERGIES:  No Known Allergies  ROS:  As per HPI rest of ROS negative.   PHYSICAL EXAMINATION:   Filed Vitals:   10/14/13 0434  BP: 98/64  Pulse: 104  Temp: 98.9 F (37.2 C)  Resp: 18   Filed Weights   10/11/13 1018 10/11/13 1435  Weight: 141 lb (63.957 kg) 142 lb 4.8 oz (64.12 kg)    79 year old wm in no acute distress A. and O. x3 General well-developed and well-nourished  HEENT: Normocephalic, atraumatic, PERRLA. Oral cavity without thrush or lesions. Neck supple. no thyromegaly, no cervical , less R  supraclavicular adenopathy  Lungs clear bilaterally . No wheezing, rhonchi or rales. Cardiac: IRRR,no murmur , rubs or gallops Abdomen soft nontender , bowel sounds x4. No HSM Extremities no clubbing cyanosis or edema. Positive for ecchymmoses, fresch on  pretibila area, not clearly negative forpetechial rash Neuro: non focal  LABORATORY/RADIOLOGY DATA:   Recent Labs Lab 10/11/13 1100  10/11/13 2040 10/11/13 2340 10/12/13 0500 10/13/13 0610 10/14/13 0716  WBC 1.2*  --   --  0.5* 0.7* 0.4* 0.7*  HGB 8.1*  < > 8.1* 7.1* 6.8* 8.2* 8.4*  HCT 24.7*  --   --  22.0* 20.7* 24.9* 25.3*  PLT 16*  --   --  33* 38* 27* 19*  MCV 92.5  --   --  91.3 91.2 90.2 89.1  MCH 30.3  --   --  29.5 30.0 29.7 29.6  MCHC 32.8  --   --  32.3 32.9 32.9 33.2  RDW 17.9*  --   --  18.1* 18.1* 17.4* 16.9*  < > = values in this interval not displayed.  CMP    Recent Labs Lab 10/11/13 1100 10/12/13 0500 10/13/13 0610  10/14/13 0716  NA 138 137 135 134*  K 5.1 4.6 3.7 3.5  CL 110 111 110 107  CO2 19 18* 18* 17*  GLUCOSE 104* 83 94 81  BUN 39* 33* 23 19  CREATININE 2.10* 1.90* 1.56* 1.66*  CALCIUM 9.1 8.1* 8.0* 8.0*  AST  --  16  --   --   ALT  --  16  --   --   ALKPHOS  --  58  --   --   BILITOT  --  0.6  --   --         Component Value Date/Time   BILITOT 0.6 10/12/2013 0500   BILITOT 0.70 10/01/2013 0930   BILIDIR 0.1 07/21/2013 1519    Anemia panel:  No results found for this basename: VITAMINB12, FOLATE, FERRITIN, TIBC, IRON, RETICCTPCT,  in the last 72 hours  No results found for this basename: TSH, T4TOTAL, FREET3, T3FREE, THYROIDAB,  in the last 72 hours   No results found for this basename: esrsedrate     Recent Labs Lab 10/11/13 1100 10/12/13 0500 10/13/13 0610 10/14/13 0500  INR 3.74* 2.36* 1.51* 1.44       Liver Function Tests:  Recent Labs Lab 10/12/13 0500  AST 16  ALT 16  ALKPHOS 58  BILITOT 0.6  PROT 4.6*  ALBUMIN 2.0*    Radiology Studies:  No results found.     ASSESSMENT AND PLAN:   79 year old male with metastatic adenocarcinoma of the lung and status post 2 cycles of carboplatin/alimta C2 D1 on 10/8 (under Dr. Gustavo Lah care), admitted [facility] ED 10/11/2013 with epistaxis, multiple episodes.Platelet count was 16 and INR 3.7 (supratherapeutic). He received one unit of platelets with initial response, now decreasing platelet count to 19k.  Last chemotherapy on 10/01/13. Check DIC. He also had anemia requiring one unit of blood-in the setting of Iron deficiency and RI (under Dr. Jon Gills care- on Aranesp q 21 days, last 09/22/2013)  and his WBC was noted to be significantly low requiring Neupogen daily. All these events are in the setting of recent chemotherapy.  Check smear.  Consider d/c IVF, since dilution exacerbates values, and patient oral intake is adequate, and renal function is at his baseline.  Dr. Clelia Croft to see patient in lieu of Dr  Myna Hidalgo who is out of office today, and is to proceed with recommendations regarding his care. Addendum to be written.    [facility] E, PA-C 10/14/2013, 9:55 AM  Seen and examined pleased to see the full note above. Patient clinically feels very well without  any bleeding complaints.  His physical examination today revealed an alert and oriented gentleman nontoxic. He did not have any epistaxis or petechiae.  His laboratory data were reviewed and summarized in the note above.  This is a nice gentleman with advanced lung cancer and status post chemotherapy on 10/01/2013. He has pancytopenia most likely related to his chemotherapy. He does not have any active bleeding noted at this time nor any signs of infections.  My recommendation is to continue the current management and repeat his CBC on 10/15/2013. If his counts remain stable as they are right now it would be reasonable to discharge him with a followup with Dr. Myna Hidalgo. I anticipate both his white cells and platelets to recover in the next few weeks as his bone marrow recovers from recent chemotherapy. He does not need any impact antibiotics unless he develops a fever and he does not appear to be the case. There will be no need to continue Neupogen as an outpatient basis either.

## 2013-10-14 NOTE — Progress Notes (Signed)
CRITICAL VALUE ALERT  Critical value received:  Platelets 19 and WBC 0.7  Date of notification:  10/14/13  Time of notification:  0815  Critical value read back:yes  Nurse who received alert:  Kathlene November RN  MD notified (1st page):  Elisabeth Pigeon MD  Time of first page:  [phone]

## 2013-10-15 LAB — CBC
HCT: 24.4 % — ABNORMAL LOW (ref 39.0–52.0)
Hemoglobin: 8.3 g/dL — ABNORMAL LOW (ref 13.0–17.0)
MCH: 30.3 pg (ref 26.0–34.0)
MCHC: 34 g/dL (ref 30.0–36.0)
MCV: 89.1 fL (ref 78.0–100.0)
Platelets: 9 10*3/uL — CL (ref 150–400)
RBC: 2.74 MIL/uL — ABNORMAL LOW (ref 4.22–5.81)
RDW: 16.8 % — ABNORMAL HIGH (ref 11.5–15.5)
WBC: 0.8 10*3/uL — CL (ref 4.0–10.5)

## 2013-10-15 LAB — BASIC METABOLIC PANEL
BUN: 19 mg/dL (ref 6–23)
CO2: 18 mEq/L — ABNORMAL LOW (ref 19–32)
Calcium: 8 mg/dL — ABNORMAL LOW (ref 8.4–10.5)
Chloride: 105 mEq/L (ref 96–112)
Creatinine, Ser: 1.8 mg/dL — ABNORMAL HIGH (ref 0.50–1.35)
GFR calc Af Amer: 39 mL/min — ABNORMAL LOW (ref >90)
GFR calc non Af Amer: 34 mL/min — ABNORMAL LOW (ref >90)
Glucose, Bld: 86 mg/dL (ref 70–99)
Potassium: 3 mEq/L — ABNORMAL LOW (ref 3.5–5.1)
Sodium: 130 mEq/L — ABNORMAL LOW (ref 135–145)

## 2013-10-15 MED ORDER — HEPARIN SOD (PORK) LOCK FLUSH 100 UNIT/ML IV SOLN
500.0000 [IU] | INTRAVENOUS | Status: DC | PRN
  Filled 2013-10-15: qty 5

## 2013-10-15 MED ORDER — POTASSIUM CHLORIDE CRYS ER 20 MEQ PO TBCR
20.0000 meq | EXTENDED_RELEASE_TABLET | Freq: Three times a day (TID) | ORAL | Status: DC
  Filled 2013-10-15 (×3): qty 1

## 2013-10-15 MED ORDER — POTASSIUM CHLORIDE CRYS ER 20 MEQ PO TBCR
40.0000 meq | EXTENDED_RELEASE_TABLET | Freq: Once | ORAL | Status: AC
  Administered 2013-10-15: 40 meq via ORAL
  Filled 2013-10-15: qty 2

## 2013-10-15 NOTE — Progress Notes (Signed)
CRITICAL VALUE ALERT  Critical value received:  Platelet count 9  Date of notification:  10/15/13  Time of notification:  0557  Critical value read back:yes  Nurse who received alert: Kenton Kingfisher, RN  MD notified (1st page):  Schorr  Time of first page:  0610  MD notified (2nd page):n/a  Time of second page: n/a  Responding MD:  Schorr  Time MD responded:  [phone] (orders received to prepare and transfuse 1unit platelets)

## 2013-10-15 NOTE — Progress Notes (Signed)
Patient received discharge instructions with wife at bedside. Both verbalized understanding of medications and follow up appointments. Also discussed when the MD needed to be called. Patient belongings packed. Patient to be taken downstairs via wheelchair to be discharged home.

## 2013-10-15 NOTE — Progress Notes (Signed)
IP PROGRESS NOTE  Subjective:   Patient feels well.  No bleeding.    Objective:  Vital signs in last 24 hours: Temp:  [97.8 F (36.6 C)-98.7 F (37.1 C)] 97.8 F (36.6 C) (10/22 0548) Pulse Rate:  [93-100] 100 (10/22 0548) Resp:  [15-16] 16 (10/22 0548) BP: (102-120)/(60-70) 102/70 mmHg (10/22 0548) SpO2:  [100 %] 100 % (10/22 0548) Weight change:  Last BM Date: 10/14/13  Intake/Output from previous day: 10/21 0701 - 10/22 0700 In: 1743.5 [P.O.:840; I.V.:903.5] Out: -   Mouth: mucous membranes moist, pharynx normal without lesions Resp: clear to auscultation bilaterally Cardio: regular rate and rhythm, S1, S2 normal, no murmur, click, rub or gallop GI: soft, non-tender; bowel sounds normal; no masses,  no organomegaly Extremities: extremities normal, atraumatic, no cyanosis or edema  Portacath without erythema  Lab Results:  Recent Labs  10/14/13 0716 10/14/13 1130 10/15/13 0515  WBC 0.7*  --  0.8*  HGB 8.4*  --  8.3*  HCT 25.3*  --  24.4*  PLT 19* 14* 9*    BMET  Recent Labs  10/14/13 0716 10/15/13 0515  NA 134* 130*  K 3.5 3.0*  CL 107 105  CO2 17* 18*  GLUCOSE 81 86  BUN 19 19  CREATININE 1.66* 1.80*  CALCIUM 8.0* 8.0*    Studies/Results: No results found.  Medications: I have reviewed the patient's current medications.  Assessment/Plan:  79 year old with:   1. Advanced lung cancer and status post chemotherapy on 10/01/2013.   2. Pancytopenia most likely related to his chemotherapy. He does not have any active bleeding noted at this time nor any signs of infections. His platelet count is down to 9 K. I would transfuse him today for bleeding prophylaxis.   3. Neutropenia:  No fevers noted. Improved with Neupogen.     LOS: 4 days   Haley Fuerstenberg 10/15/2013, 8:22 AM

## 2013-10-15 NOTE — Discharge Summary (Signed)
Physician Discharge Summary  [name] [name] ZOX:[id number] DOB: 1930-09-01 DOA: 10/11/2013  PCP: [name], MD  Admit date: 10/11/2013 Discharge date: 10/15/2013  Recommendations for Outpatient Follow-up:  1. Note: Patient being discharged OFF coumadin until his platelet count improved.  Instructed to F/U with Dr. Linford Arnold and Dr. Myna Hidalgo for further instructions as to when to resume coumadin.  Discharge Diagnoses:  Principal Problem:      Epistaxis and associated Acute blood loss anemia in the setting of thrombocytopenia from anti-neoplastic chemotherapy Active Problems:    HYPERLIPIDEMIA    Essential hypertension, benign    CAD    CKD (chronic kidney disease)    Paroxysmal atrial fibrillation    Gouty tophi of joint    Metastatic adenocarcinoma    Anemia of chronic disease    Antineoplastic chemotherapy induced pancytopenia    Chronic systolic CHF (congestive heart failure)   Discharge Condition: Improved.  Diet recommendation: Low-sodium, heart healthy.  History of present illness:  [name] [name] is an 79 y.o. male with PMH of metastatic adenocarcinoma of the lung status post 1 cycle of carboplatin/alimta, hypertension, atrophic fibrillation on Coumadin, systolic CHF who was admitted on 10/11/2013 with epistaxis. Hospital course complicated by ongoing neutropenia and thrombocytopenia.  Hospital Course by problem:  Principal Problem:  Epistaxis and associated Acute blood loss anemia in the setting of thrombocytopenia from anti-neoplastic chemotherapy  -Epistaxis resolved.  -Platelets were 16k on admission. He received 1 unit of platelets with response, rising to 33 on 10/18 and 38,000 on 10/19, but back down to 9000 today. Given one further unit of packed red blood cells prior to discharge. Active Problems:  Hypokalemia  -Given 40 mEq of oral potassium prior to discharge.  HYPERLIPIDEMIA  -Continue statin therapy.  Essential hypertension, benign   -Continue Coreg.  CAD  -Stable.  Stage III CKD (chronic kidney disease)  -Baseline creatinine appears to be 2.0-2.7. Current creatinine improved over usual baseline values.  Paroxysmal atrial fibrillation  -Rate controlled. Supratherapeutic INR on admission. Coumadin on hold. We'll not resume until platelet count recovered. Instructed to followup with PCP and his oncologist for further recommendations as to when to resume this medication. Gouty tophi of joint  -Continue Allopurinol.  Metastatic adenocarcinoma of the lung  -Status post 1 cycle of chemotherapy with carboplatin and alimta on 10/01/13.  -Seen by oncology 10/14/2013.   Anemia of chronic disease / Antineoplastic chemotherapy induced pancytopenia  -Status post 1 unit of platelets on 10/11/2013.  -Status post 1 unit of packed red blood cells on 10/12/2013.  -Monitor blood counts closely and transfuse as needed.  -Treated with Neupogen as well, no need to continue this medication at discharge (discussed with Dr. Clelia Croft). Chronic systolic CHF (congestive heart failure)  -2-D echocardiogram done 05/01/2013. EF 45-50%.  Procedures:  None.  Consultations:  Dr. Eli Hose, Oncology  Discharge Exam: Filed Vitals:   10/15/13 1025  BP: 111/48  Pulse: 83  Temp: 98 F (36.7 C)  Resp: 20   Filed Vitals:   10/14/13 2134 10/15/13 0548 10/15/13 0950 10/15/13 1025  BP: 116/68 102/70 102/55 111/48  Pulse: 98 100 60 83  Temp: 98.7 F (37.1 C) 97.8 F (36.6 C) 97.7 F (36.5 C) 98 F (36.7 C)  TempSrc:  Oral Oral Oral  Resp: 15 16 20 20   Height:      Weight:      SpO2: 100% 100% 100% 96%    Gen:  NAD Cardiovascular:  RRR, No M/R/G Respiratory: Lungs CTAB Gastrointestinal: Abdomen soft, NT/ND with  normal active bowel sounds. Extremities: No C/E/C   Discharge Instructions  Discharge Orders   Future Appointments Provider Department Dept Phone   10/29/2013 11:00 AM [facility] [phone]   Pt should arrive15 minutes prior to scheduled appt time. Please inform patient that exam will take a minimum of 1 1/2 hours. Patient to be NPO 6 hours prior to exam  and should not take any insulin the day of exam.   11/05/2013 11:00 AM Rachael Fee [facility] [phone]   11/05/2013 11:30 AM Josph Macho, MD [facility] [phone]   Future Orders Complete By Expires   Call MD for:  extreme fatigue  As directed    Call MD for:  temperature >100.4  As directed    Diet - low sodium heart healthy  As directed    Discharge instructions  As directed    Comments:     Your blood counts remain low and as such, you are at risk for infections, bleeding problems, and lack of energy. If you develop any symptoms of infection such as fevers, shortness of breath, cough, further bleeding problems, or severe lack of energy, you need to be evaluated immediately by your doctor.   Increase activity slowly  As directed        Medication List    STOP taking these medications       warfarin 5 MG tablet  Commonly known as:  COUMADIN      TAKE these medications       allopurinol 100 MG tablet  Commonly known as:  ZYLOPRIM  Take 100 mg by mouth at bedtime.     amiodarone 200 MG tablet  Commonly known as:  PACERONE  Take 0.5 tablets (100 mg total) by mouth daily.     atorvastatin 40 MG tablet  Commonly known as:  LIPITOR  Take 20 mg by mouth every evening. 1/2 tablet daily     carvedilol 6.25 MG tablet  Commonly known as:  COREG  Take 3.125 mg by mouth 2 (two) times daily with a meal.     cholecalciferol 1000 UNITS tablet  Commonly known as:  VITAMIN D  Take 1,000 Units by mouth every morning.     ferrous sulfate 325 (65 FE) MG tablet  Take 325 mg by mouth every evening.     fish oil-omega-3 fatty acids 1000 MG capsule  Take 1 g by mouth 2 (two) times daily.     FLAXSEED (LINSEED) PO  Take 1 tablet  by mouth at bedtime.     folic acid 400 MCG tablet  Commonly known as:  FOLVITE  Take 400 mcg by mouth every morning.     isosorbide mononitrate 60 MG 24 hr tablet  Commonly known as:  IMDUR  Take 30 mg by mouth every evening.     nitroGLYCERIN 0.4 MG SL tablet  Commonly known as:  NITROSTAT  Place 1 tablet (0.4 mg total) under the tongue every 5 (five) minutes as needed for chest pain.     potassium chloride SA 20 MEQ tablet  Commonly known as:  K-DUR,KLOR-CON  Take 10 mEq by mouth 2 (two) times daily.     tamsulosin 0.4 MG Caps capsule  Commonly known as:  FLOMAX  Take 0.4 mg by mouth every other day.           Follow-up Information   Follow up with METHENEY,CATHERINE,  MD. Schedule an appointment as soon as possible for a visit in 1 week.   Specialty:  Family Medicine   Contact information:   [address] [place] [address] [phone]       Follow up with Josph Macho, MD. Schedule an appointment as soon as possible for a visit in 1 week.   Specialty:  Oncology   Contact information:   [address] [place] [address] [phone]        The results of significant diagnostics from this hospitalization (including imaging, microbiology, ancillary and laboratory) are listed below for reference.    Significant Diagnostic Studies: No results found.  Labs:  Basic Metabolic Panel:  Recent Labs Lab 10/11/13 1100 10/12/13 0500 10/13/13 0610 10/14/13 0716 10/15/13 0515  NA 138 137 135 134* 130*  K 5.1 4.6 3.7 3.5 3.0*  CL 110 111 110 107 105  CO2 19 18* 18* 17* 18*  GLUCOSE 104* 83 94 81 86  BUN 39* 33* 23 19 19   CREATININE 2.10* 1.90* 1.56* 1.66* 1.80*  CALCIUM 9.1 8.1* 8.0* 8.0* 8.0*   GFR Estimated Creatinine Clearance: 27.4 ml/min (by C-G formula based on Cr of 1.8). Liver Function Tests:  Recent Labs Lab 10/12/13 0500  AST 16  ALT 16  ALKPHOS 58  BILITOT 0.6  PROT 4.6*  ALBUMIN 2.0*    Coagulation profile  Recent Labs Lab 10/12/13 0500 10/13/13 0610 10/14/13 0500 10/14/13 1130 10/15/13 0515  INR 2.36* 1.51* 1.44 1.33 1.66*    CBC:  Recent Labs Lab 10/11/13 2340 10/12/13 0500 10/13/13 0610 10/14/13 0716 10/14/13 1130 10/15/13 0515  WBC 0.5* 0.7* 0.4* 0.7*  --  0.8*  HGB 7.1* 6.8* 8.2* 8.4*  --  8.3*  HCT 22.0* 20.7* 24.9* 25.3*  --  24.4*  MCV 91.3 91.2 90.2 89.1  --  89.1  PLT 33* 38* 27* 19* 14* 9*   D-Dimer  Recent Labs  10/14/13 1130  DDIMER 2.82*    Time coordinating discharge: 35 minutes.  Signed:  Esvin Hnat  Pager [phone] [facility] 10/15/2013, 10:48 AM

## 2013-10-15 NOTE — Care Management Note (Signed)
   CARE MANAGEMENT NOTE 10/15/2013  Patient:  [name] [name],[name] [name]   Account Number:  [id number]  Date Initiated:  10/12/2013  Documentation initiated by:  [facility]  Subjective/Objective Assessment:   79 year old male admitted with epistaxis, anemia and thrombocytopenia.     Action/Plan:   From home.   Anticipated DC Date:  10/15/2013   Anticipated DC Plan:  HOME/SELF CARE      DC Planning Services  CM consult      Choice offered to / List presented to:             Status of service:  In process, will continue to follow Medicare Important Message given?  NA - LOS <3 / Initial given by admissions (If response is "NO", the following Medicare IM given date fields will be blank) Date Medicare IM given:   Date Additional Medicare IM given:    Discharge Disposition:    Per UR Regulation:  Reviewed for med. necessity/level of care/duration of stay  If discussed at Long Length of Stay Meetings, dates discussed:    Comments:  Sharmon Leyden, RN Registered Nurse Signed CASE MANAGEMENT Care Management Note Service date: 10/13/2013 11:45 AM Cm spoke with patient at bedside concerning discharge planning. Per pt currently active with [facility] services provided by [facility]. Per pt independent PTA, has RW for home use. No other needs identified at this time. Pt will require resumption of HHRN services from MD prior to dc.

## 2013-10-15 NOTE — Progress Notes (Signed)
[facility] PROGRESS NOTE  [name] [name] ZOX:[id number] DOB: [birth year] DOA: 10/11/2013 PCP: [name], MD  Brief narrative: [name] [name] is an 79 y.o. male with PMH of metastatic adenocarcinoma of the lung status post 1 cycle of carboplatin/alimta, hypertension, atrophic fibrillation on Coumadin, systolic CHF who was admitted on 10/11/2013 with epistaxis. Hospital course complicated by ongoing neutropenia and thrombocytopenia.  Assessment/Plan: Principal Problem:   Epistaxis and associated Acute blood loss anemia in the setting of thrombocytopenia from anti-neoplastic chemotherapy -Epistaxis resolved. -Platelets were 16k on admission.  He received 1 unit of platelets with response, rising to 33 on 10/18 and 38,000 on 10/19, but back down to 9000 today. -Transfuse platelets as needed for bleeding. Active Problems:   Hypokalemia -Supplementation ordered.   HYPERLIPIDEMIA -Continue statin therapy.   Essential hypertension, benign -Continue Coreg.   CAD -Stable.   Stage III CKD (chronic kidney disease) -Baseline creatinine appears to be 2.0-2.7. Current creatinine improved over usual baseline values.   Paroxysmal atrial fibrillation -Rate controlled. Supratherapeutic INR on admission. Coumadin on hold.   Gouty tophi of joint -Continue Allopurinol.   Metastatic adenocarcinoma of the lung -Status post 1 cycle of chemotherapy with carboplatin and alimta on 10/01/13. -Seen by oncology 10/14/2013. Recommendations noted.   Anemia of chronic disease / Antineoplastic chemotherapy induced pancytopenia -Status post 1 unit of platelets on 10/11/2013. -Status post 1 unit of packed red blood cells on 10/12/2013. -Monitor blood counts closely and transfuse as needed.   Chronic systolic CHF (congestive heart failure) -2-D echocardiogram done 05/01/2013. EF 45-50%.   Code Status: Full. Family Communication: Wife updated at bedside. Disposition Plan: Home today.   Medical  Consultants:  Dr. Donata Clay, Oncology  Other Consultants:  None.  Anti-infectives:  None.  HPI/Subjective: [name] [name] is extremely irritable and insists on being discharged today. He denies any symptoms and states that he has not had any problems with nosebleeds since his admission.  Objective: Filed Vitals:   10/14/13 0434 10/14/13 1522 10/14/13 2134 10/15/13 0548  BP: 98/64 120/60 116/68 102/70  Pulse: 104 93 98 100  Temp: 98.9 F (37.2 C) 98.2 F (36.8 C) 98.7 F (37.1 C) 97.8 F (36.6 C)  TempSrc: Oral Oral  Oral  Resp: 18 16 15 16   Height:      Weight:      SpO2: 100% 100% 100% 100%    Intake/Output Summary (Last 24 hours) at 10/15/13 0752 Last data filed at 10/15/13 0548  Gross per 24 hour  Intake 1743.5 ml  Output      0 ml  Net 1743.5 ml    Exam: Gen:  NAD Cardiovascular:  RRR, No M/R/G Respiratory:  Lungs CTAB Gastrointestinal:  Abdomen soft, NT/ND, + BS Extremities:  No C/E/C  Data Reviewed: Basic Metabolic Panel:  Recent Labs Lab 10/11/13 1100 10/12/13 0500 10/13/13 0610 10/14/13 0716 10/15/13 0515  NA 138 137 135 134* 130*  K 5.1 4.6 3.7 3.5 3.0*  CL 110 111 110 107 105  CO2 19 18* 18* 17* 18*  GLUCOSE 104* 83 94 81 86  BUN 39* 33* 23 19 19   CREATININE 2.10* 1.90* 1.56* 1.66* 1.80*  CALCIUM 9.1 8.1* 8.0* 8.0* 8.0*   GFR Estimated Creatinine Clearance: 27.4 ml/min (by C-G formula based on Cr of 1.8). Liver Function Tests:  Recent Labs Lab 10/12/13 0500  AST 16  ALT 16  ALKPHOS 58  BILITOT 0.6  PROT 4.6*  ALBUMIN 2.0*   Coagulation profile  Recent Labs Lab 10/12/13 0500 10/13/13 0610 10/14/13  0500 10/14/13 1130 10/15/13 0515  INR 2.36* 1.51* 1.44 1.33 1.66*    CBC:  Recent Labs Lab 10/11/13 2340 10/12/13 0500 10/13/13 0610 10/14/13 0716 10/14/13 1130 10/15/13 0515  WBC 0.5* 0.7* 0.4* 0.7*  --  0.8*  HGB 7.1* 6.8* 8.2* 8.4*  --  8.3*  HCT 22.0* 20.7* 24.9* 25.3*  --  24.4*  MCV 91.3 91.2 90.2 89.1   --  89.1  PLT 33* 38* 27* 19* 14* 9*   BNP (last 3 results)  Recent Labs  03/05/13 1208  PROBNP 557.0*   D-Dimer  Recent Labs  10/14/13 1130  DDIMER 2.82*    Procedures and Diagnostic Studies: No results found.  Scheduled Meds: . allopurinol  100 mg Oral QHS  . amiodarone  100 mg Oral Daily  . atorvastatin  20 mg Oral QPM  . carvedilol  3.125 mg Oral BID WC  . ferrous sulfate  325 mg Oral QPM  . filgrastim (NEUPOGEN)  SQ  480 mcg Subcutaneous q1800  . folic acid  500 mcg Oral Daily  . tamsulosin  0.4 mg Oral QODAY   Continuous Infusions: . sodium chloride 20 mL/hr at 10/14/13 1234    Time spent: 35 minutes   LOS: 4 days   RAMA,CHRISTINA  [facility] Pager [phone].   *Please note that the hospitalists switch teams on Wednesdays. Please call the flow manager at [phone] if you are having difficulty reaching the hospitalist taking care of this patient as she can update you and provide the most up-to-date pager number of provider caring for the patient. If 8PM-8AM, please contact night-coverage at [email or web address], password [facility]  10/15/2013, 7:52 AM      In an effort to keep you and your family informed about your hospital stay, I am providing you with this information sheet. If you or your family have any questions, please do not hesitate to have the nursing staff page me to set up a meeting time.  Naythan Douthit 10/15/2013 4 (Number of days in the hospital)  Treatment team:  Dr. Hillery Aldo, Hospitalist (Internist)  Dr. Eli Hose, Oncologist  Active Treatment Issues with Plan: Principal Problem:   Nosebleed with anemia in the setting of low platelet count from chemotherapy -Nosebleed resolved, use cold packs to nose and pressure as needed for active bleeding. -Platelets were 16 on admission.  You were given 1 unit of platelets with response, rising to 33 on 10/18 and 38,000 on 10/19, but back down to 9000 today. For an additional unit of  platelets today. Active Problems:   High cholesterol -Continue Lipitor.   High blood pressure -Continue Coreg.   History of coronary artery disease -Stable on Imdur and Coreg.   Chronic kidney disease -Stable.   Irregular heartbeat (atrial fibrillation) -Heart rate controlled. INR high on admission. Coumadin on hold.   Gout -Continue Allopurinol.   Lung cancer -Status post 1 cycle of chemotherapy with carboplatin and alimta on 10/01/13. -Seen by oncology 10/14/2013.    Low blood counts from chemotherapy -Status post 1 unit of platelets on 10/11/2013. -Status post 1 unit of packed red blood cells on 10/12/2013. -Monitor blood counts closely and transfuse as needed.   History of congestive heart failure -2-D echocardiogram done 05/01/2013. EF 45-50%. Stable.  Significant Lab results: -INR 1.66 (therapeutic 2.0-3.0) -WBC (cells that fight infection) 0.8 (normal 4.0-10.5) -Hemoglobin 8.3 (normal 13.0-17.0) -Platelets (cells that help blood to form clots) 9 (normal 150-400) -Potassium 3.0 (normal 3.5-5.1) Significant diagnostic study results: -No x-rays done  this admission.  Anticipated discharge date: 1-2 days.

## 2013-10-16 ENCOUNTER — Telehealth: Payer: Self-pay | Admitting: Hematology & Oncology

## 2013-10-16 LAB — PREPARE PLATELET PHERESIS: Unit division: 0

## 2013-10-16 NOTE — Telephone Encounter (Signed)
Pt called waqnts to schedule hospital follow up for next week. Pt aware we will call back 10-24 after [employer] to MD

## 2013-10-20 ENCOUNTER — Telehealth: Payer: Self-pay | Admitting: Hematology & Oncology

## 2013-10-20 NOTE — Telephone Encounter (Signed)
Pt aware of 10-30 hospital follow up. Dr. Myna Hidalgo aware

## 2013-10-22 ENCOUNTER — Other Ambulatory Visit: Payer: Self-pay

## 2013-10-22 ENCOUNTER — Ambulatory Visit (HOSPITAL_BASED_OUTPATIENT_CLINIC_OR_DEPARTMENT_OTHER): Payer: Medicare Other | Admitting: Hematology & Oncology

## 2013-10-22 ENCOUNTER — Ambulatory Visit: Payer: Medicare Other | Admitting: Hematology & Oncology

## 2013-10-22 ENCOUNTER — Encounter (HOSPITAL_BASED_OUTPATIENT_CLINIC_OR_DEPARTMENT_OTHER): Payer: Self-pay | Admitting: Emergency Medicine

## 2013-10-22 ENCOUNTER — Ambulatory Visit: Payer: Medicare Other

## 2013-10-22 ENCOUNTER — Inpatient Hospital Stay (HOSPITAL_COMMUNITY): Payer: Medicare Other

## 2013-10-22 ENCOUNTER — Emergency Department (HOSPITAL_BASED_OUTPATIENT_CLINIC_OR_DEPARTMENT_OTHER): Payer: Medicare Other

## 2013-10-22 ENCOUNTER — Other Ambulatory Visit: Payer: Medicare Other | Admitting: Lab

## 2013-10-22 ENCOUNTER — Telehealth: Payer: Self-pay | Admitting: *Deleted

## 2013-10-22 ENCOUNTER — Inpatient Hospital Stay (HOSPITAL_BASED_OUTPATIENT_CLINIC_OR_DEPARTMENT_OTHER)
Admission: EM | Admit: 2013-10-22 | Discharge: 2013-10-26 | DRG: 682 | Disposition: A | Discharge status: Home / Self Care | Payer: Medicare Other | Attending: Internal Medicine | Admitting: Internal Medicine

## 2013-10-22 ENCOUNTER — Other Ambulatory Visit (HOSPITAL_BASED_OUTPATIENT_CLINIC_OR_DEPARTMENT_OTHER): Payer: Medicare Other | Admitting: Lab

## 2013-10-22 DIAGNOSIS — I059 Rheumatic mitral valve disease, unspecified: Secondary | ICD-10-CM | POA: Diagnosis present

## 2013-10-22 DIAGNOSIS — C801 Malignant (primary) neoplasm, unspecified: Secondary | ICD-10-CM

## 2013-10-22 DIAGNOSIS — I129 Hypertensive chronic kidney disease with stage 1 through stage 4 chronic kidney disease, or unspecified chronic kidney disease: Secondary | ICD-10-CM | POA: Diagnosis present

## 2013-10-22 DIAGNOSIS — C799 Secondary malignant neoplasm of unspecified site: Secondary | ICD-10-CM

## 2013-10-22 DIAGNOSIS — Z87891 Personal history of nicotine dependence: Secondary | ICD-10-CM

## 2013-10-22 DIAGNOSIS — C3490 Malignant neoplasm of unspecified part of unspecified bronchus or lung: Secondary | ICD-10-CM

## 2013-10-22 DIAGNOSIS — R42 Dizziness and giddiness: Secondary | ICD-10-CM

## 2013-10-22 DIAGNOSIS — N183 Chronic kidney disease, stage 3 unspecified: Secondary | ICD-10-CM | POA: Diagnosis present

## 2013-10-22 DIAGNOSIS — E875 Hyperkalemia: Secondary | ICD-10-CM

## 2013-10-22 DIAGNOSIS — I4892 Unspecified atrial flutter: Secondary | ICD-10-CM

## 2013-10-22 DIAGNOSIS — I509 Heart failure, unspecified: Secondary | ICD-10-CM | POA: Diagnosis present

## 2013-10-22 DIAGNOSIS — Z923 Personal history of irradiation: Secondary | ICD-10-CM

## 2013-10-22 DIAGNOSIS — I4891 Unspecified atrial fibrillation: Secondary | ICD-10-CM

## 2013-10-22 DIAGNOSIS — E46 Unspecified protein-calorie malnutrition: Secondary | ICD-10-CM | POA: Diagnosis present

## 2013-10-22 DIAGNOSIS — Z66 Do not resuscitate: Secondary | ICD-10-CM

## 2013-10-22 DIAGNOSIS — T451X5A Adverse effect of antineoplastic and immunosuppressive drugs, initial encounter: Secondary | ICD-10-CM | POA: Diagnosis present

## 2013-10-22 DIAGNOSIS — M109 Gout, unspecified: Secondary | ICD-10-CM | POA: Diagnosis present

## 2013-10-22 DIAGNOSIS — I2589 Other forms of chronic ischemic heart disease: Secondary | ICD-10-CM | POA: Diagnosis present

## 2013-10-22 DIAGNOSIS — Z9229 Personal history of other drug therapy: Secondary | ICD-10-CM

## 2013-10-22 DIAGNOSIS — D638 Anemia in other chronic diseases classified elsewhere: Secondary | ICD-10-CM

## 2013-10-22 DIAGNOSIS — I252 Old myocardial infarction: Secondary | ICD-10-CM

## 2013-10-22 DIAGNOSIS — C7800 Secondary malignant neoplasm of unspecified lung: Secondary | ICD-10-CM | POA: Diagnosis present

## 2013-10-22 DIAGNOSIS — Z7901 Long term (current) use of anticoagulants: Secondary | ICD-10-CM

## 2013-10-22 DIAGNOSIS — N19 Unspecified kidney failure: Secondary | ICD-10-CM

## 2013-10-22 DIAGNOSIS — I480 Paroxysmal atrial fibrillation: Secondary | ICD-10-CM

## 2013-10-22 DIAGNOSIS — E860 Dehydration: Secondary | ICD-10-CM

## 2013-10-22 DIAGNOSIS — M129 Arthropathy, unspecified: Secondary | ICD-10-CM | POA: Diagnosis present

## 2013-10-22 DIAGNOSIS — I10 Essential (primary) hypertension: Secondary | ICD-10-CM | POA: Diagnosis present

## 2013-10-22 DIAGNOSIS — D61810 Antineoplastic chemotherapy induced pancytopenia: Secondary | ICD-10-CM | POA: Diagnosis present

## 2013-10-22 DIAGNOSIS — I739 Peripheral vascular disease, unspecified: Secondary | ICD-10-CM | POA: Diagnosis present

## 2013-10-22 DIAGNOSIS — E43 Unspecified severe protein-calorie malnutrition: Secondary | ICD-10-CM | POA: Insufficient documentation

## 2013-10-22 DIAGNOSIS — R198 Other specified symptoms and signs involving the digestive system and abdomen: Secondary | ICD-10-CM

## 2013-10-22 DIAGNOSIS — E785 Hyperlipidemia, unspecified: Secondary | ICD-10-CM | POA: Diagnosis present

## 2013-10-22 DIAGNOSIS — Z79899 Other long term (current) drug therapy: Secondary | ICD-10-CM

## 2013-10-22 DIAGNOSIS — Z951 Presence of aortocoronary bypass graft: Secondary | ICD-10-CM

## 2013-10-22 DIAGNOSIS — Z932 Ileostomy status: Secondary | ICD-10-CM

## 2013-10-22 DIAGNOSIS — E861 Hypovolemia: Secondary | ICD-10-CM | POA: Diagnosis present

## 2013-10-22 DIAGNOSIS — I255 Ischemic cardiomyopathy: Secondary | ICD-10-CM | POA: Diagnosis present

## 2013-10-22 DIAGNOSIS — N179 Acute kidney failure, unspecified: Principal | ICD-10-CM

## 2013-10-22 DIAGNOSIS — I2510 Atherosclerotic heart disease of native coronary artery without angina pectoris: Secondary | ICD-10-CM | POA: Diagnosis present

## 2013-10-22 DIAGNOSIS — I5022 Chronic systolic (congestive) heart failure: Secondary | ICD-10-CM | POA: Diagnosis present

## 2013-10-22 DIAGNOSIS — N189 Chronic kidney disease, unspecified: Secondary | ICD-10-CM | POA: Diagnosis present

## 2013-10-22 DIAGNOSIS — N390 Urinary tract infection, site not specified: Secondary | ICD-10-CM

## 2013-10-22 DIAGNOSIS — I340 Nonrheumatic mitral (valve) insufficiency: Secondary | ICD-10-CM | POA: Diagnosis present

## 2013-10-22 LAB — TECHNOLOGIST REVIEW CHCC SATELLITE

## 2013-10-22 LAB — CBC WITH DIFFERENTIAL (CANCER CENTER ONLY)
BASO#: 0 10*3/uL (ref 0.0–0.2)
BASO%: 0.1 % (ref 0.0–2.0)
EOS%: 0 % (ref 0.0–7.0)
Eosinophils Absolute: 0 10*3/uL (ref 0.0–0.5)
HCT: 33.2 % — ABNORMAL LOW (ref 38.7–49.9)
HGB: 11 g/dL — ABNORMAL LOW (ref 13.0–17.1)
LYMPH%: 6.6 % — ABNORMAL LOW (ref 14.0–48.0)
MCH: 30.2 pg (ref 28.0–33.4)
MCHC: 33.1 g/dL (ref 32.0–35.9)
MCV: 91 fL (ref 82–98)
MONO%: 6.4 % (ref 0.0–13.0)
NEUT#: 9.5 10*3/uL — ABNORMAL HIGH (ref 1.5–6.5)
NEUT%: 86.9 % — ABNORMAL HIGH (ref 40.0–80.0)
RDW: 16.6 % — ABNORMAL HIGH (ref 11.1–15.7)

## 2013-10-22 LAB — COMPREHENSIVE METABOLIC PANEL
ALT: 20 U/L (ref 0–53)
AST: 23 U/L (ref 0–37)
Albumin: 2.8 g/dL — ABNORMAL LOW (ref 3.5–5.2)
Alkaline Phosphatase: 98 U/L (ref 39–117)
BUN: 66 mg/dL — ABNORMAL HIGH (ref 6–23)
Calcium: 9.4 mg/dL (ref 8.4–10.5)
Chloride: 92 mEq/L — ABNORMAL LOW (ref 96–112)
Creatinine, Ser: 5.9 mg/dL — ABNORMAL HIGH (ref 0.50–1.35)
Potassium: 6 mEq/L — ABNORMAL HIGH (ref 3.5–5.1)
Total Bilirubin: 0.4 mg/dL (ref 0.3–1.2)
Total Protein: 6.6 g/dL (ref 6.0–8.3)

## 2013-10-22 LAB — CBC WITH DIFFERENTIAL/PLATELET
Band Neutrophils: 2 % (ref 0–10)
Basophils Absolute: 0 10*3/uL (ref 0.0–0.1)
Eosinophils Absolute: 0 10*3/uL (ref 0.0–0.7)
Hemoglobin: 10.4 g/dL — ABNORMAL LOW (ref 13.0–17.0)
Lymphocytes Relative: 6 % — ABNORMAL LOW (ref 12–46)
MCHC: 34 g/dL (ref 30.0–36.0)
Monocytes Absolute: 0.6 10*3/uL (ref 0.1–1.0)
Monocytes Relative: 6 % (ref 3–12)
Platelets: 45 10*3/uL — ABNORMAL LOW (ref 150–400)
RDW: 16.3 % — ABNORMAL HIGH (ref 11.5–15.5)
WBC: 10.7 10*3/uL — ABNORMAL HIGH (ref 4.0–10.5)

## 2013-10-22 LAB — URINALYSIS, ROUTINE W REFLEX MICROSCOPIC
Nitrite: NEGATIVE
Specific Gravity, Urine: 1.016 (ref 1.005–1.030)
Urobilinogen, UA: 0.2 mg/dL (ref 0.0–1.0)
pH: 6 (ref 5.0–8.0)

## 2013-10-22 LAB — PROTIME-INR
INR: 1.05 (ref 0.00–1.49)
Prothrombin Time: 13.5 seconds (ref 11.6–15.2)

## 2013-10-22 LAB — BASIC METABOLIC PANEL
CO2: 21 mEq/L (ref 19–32)
Calcium: 8.5 mg/dL (ref 8.4–10.5)
Chloride: 100 mEq/L (ref 96–112)
Creatinine, Ser: 5.6 mg/dL — ABNORMAL HIGH (ref 0.50–1.35)
GFR calc Af Amer: 10 mL/min — ABNORMAL LOW (ref >90)
GFR calc non Af Amer: 9 mL/min — ABNORMAL LOW (ref >90)
Glucose, Bld: 97 mg/dL (ref 70–99)
Sodium: 139 mEq/L (ref 135–145)

## 2013-10-22 LAB — BASIC METABOLIC PANEL - CANCER CENTER ONLY
BUN, Bld: 66 mg/dL — ABNORMAL HIGH (ref 7–22)
Calcium: 9.4 mg/dL (ref 8.0–10.3)
Creat: 5.9 mg/dl (ref 0.6–1.2)
Glucose, Bld: 127 mg/dL — ABNORMAL HIGH (ref 73–118)

## 2013-10-22 LAB — URINE MICROSCOPIC-ADD ON

## 2013-10-22 MED ORDER — INSULIN REGULAR HUMAN 100 UNIT/ML IJ SOLN
10.0000 [IU] | Freq: Once | INTRAMUSCULAR | Status: AC
  Administered 2013-10-22: 10 [IU] via INTRAVENOUS
  Filled 2013-10-22: qty 1

## 2013-10-22 MED ORDER — SODIUM CHLORIDE 0.9 % IV SOLN
INTRAVENOUS | Status: AC
  Administered 2013-10-22: 20:00:00 via INTRAVENOUS

## 2013-10-22 MED ORDER — LIDOCAINE HCL 2 % EX GEL
Freq: Once | CUTANEOUS | Status: AC
  Administered 2013-10-22: 20 via URETHRAL

## 2013-10-22 MED ORDER — SODIUM CHLORIDE 0.9 % IV BOLUS (SEPSIS)
500.0000 mL | Freq: Once | INTRAVENOUS | Status: AC
  Administered 2013-10-22: 500 mL via INTRAVENOUS

## 2013-10-22 MED ORDER — LIDOCAINE HCL 2 % EX GEL
CUTANEOUS | Status: AC
  Filled 2013-10-22: qty 20

## 2013-10-22 MED ORDER — WHITE PETROLATUM GEL
Status: AC
  Administered 2013-10-22
  Filled 2013-10-22: qty 5

## 2013-10-22 MED ORDER — DEXTROSE 50 % IV SOLN
50.0000 mL | Freq: Once | INTRAVENOUS | Status: AC
  Administered 2013-10-22: 50 mL via INTRAVENOUS
  Filled 2013-10-22: qty 50

## 2013-10-22 MED ORDER — ONDANSETRON HCL 4 MG/2ML IJ SOLN: 4.0000 mg | Freq: Three times a day (TID) | INTRAMUSCULAR | Status: AC | PRN

## 2013-10-22 MED ORDER — DEXTROSE 5 % IV SOLN
1.0000 g | Freq: Once | INTRAVENOUS | Status: AC
  Administered 2013-10-22: 1 g via INTRAVENOUS

## 2013-10-22 MED ORDER — FENTANYL CITRATE 0.05 MG/ML IJ SOLN
50.0000 ug | Freq: Once | INTRAMUSCULAR | Status: AC
  Administered 2013-10-22: 50 ug via INTRAVENOUS
  Filled 2013-10-22: qty 2

## 2013-10-22 MED ORDER — SODIUM POLYSTYRENE SULFONATE 15 GM/60ML PO SUSP
30.0000 g | Freq: Once | ORAL | Status: AC
  Administered 2013-10-22: 30 g via ORAL
  Filled 2013-10-22: qty 120

## 2013-10-22 MED ORDER — CEFTRIAXONE SODIUM 1 G IJ SOLR
INTRAMUSCULAR | Status: AC
  Filled 2013-10-22: qty 10

## 2013-10-22 MED ORDER — MORPHINE SULFATE 2 MG/ML IJ SOLN
2.0000 mg | Freq: Once | INTRAMUSCULAR | Status: AC
  Administered 2013-10-22: 2 mg via INTRAVENOUS
  Filled 2013-10-22: qty 1

## 2013-10-22 MED ORDER — ONDANSETRON HCL 4 MG/2ML IJ SOLN
4.0000 mg | Freq: Once | INTRAMUSCULAR | Status: AC
  Administered 2013-10-22: 4 mg via INTRAVENOUS
  Filled 2013-10-22: qty 2

## 2013-10-22 MED ORDER — FUROSEMIDE 10 MG/ML IJ SOLN
20.0000 mg | Freq: Once | INTRAMUSCULAR | Status: AC
  Administered 2013-10-22: 20 mg via INTRAVENOUS
  Filled 2013-10-22: qty 2

## 2013-10-22 NOTE — ED Notes (Signed)
Delight Ovens, EMT attempted foley fr#16 w/o success-A Renaldo Fiddler, RN was able to insert coude #18 coude cath-minimal blood tinged urine

## 2013-10-22 NOTE — Consult Note (Signed)
Reason for Consult: Acute Kidney Injury Referring Physician: Dr. Mahala Menghini  CC: Dizziness and fatigue  Assessment/Plan: 1 Acute Kidney Injury on CKD III (baseline creatinine 1.8-2.2). The AKI may be secondary to prerenal azotemia vs acute tubular necrosis vs obstruction +/- cytotoxic effects from the Alimta which has been associated with ATN + interstitial fibrosis. Carboplatin can cause tubulointerstitial injury but certainly lower risk than with Cisplatin. The chemotherapeutic agents may have also lead to an element of prerenal azotemia from the severe anorexia and decreased PO intake + diarrhea. The proteinuria is concerning as he has not had any proteinuria in the past. - Will check a FENA given that the patient is oliguric. - Repeat urinalysis with microscopy, UPC --> prior specimen was after a traumatic foley insertion with minimal urine returned. If there is significant proteinuria could he have membranous nephropathy in association with a solid malignancy? Seems less likely with no proteinuria just a few months ago. Less likely to be a paraprotein with a positive urine dipstick for proteinuria. Will complete workup if repeat confirms significant degree of proteinuria. - Renal ultrasound to rule out obstruction (lower on the differential) - Aggressive isotonic fluid resuscitation given that he has no evidence of overload on exam. - Hold further lasix at this time. - Would recommend holding Allopurinol; he has had leukocyturia for well over 6 months --> could he have had chronic interstitial nephritis? Recommend starting Uloric instead if needed which is less renal toxic.  2. Hyperkalemia: secondary to the acute renal failure in the setting of PO KCL ( daily). He was hypokalemic the previous hospitalization. - KCL already stopped. - Aggressive hydration should help by delivering increased Na to the distal tubules for Na/K exchange. - Kayexalate as needed. - No acute indication for  dialysis.  3. Hypertension: Relative hypotension at this time. Again hydration will help along with holding diuretics.  4. CHF: Compensated.  5. 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  HPI: [name] [name] is a 79 y.o. 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 He was also hospitalized 1-1/2 weeks ago with epistaxis and found with pancytopenia leading to transfusion with a  Unit of platelets, unit of RBC's and one injection of Neupogen followed by Dr. Clelia Croft. He also has a history of chronic kidney disease stage III with a baseline creatinine that fluctuates between 1.8 and 2.2 followed by Dr. Annie Sable. Past urinalyses dating back a year have all consistently demonstrated pyuria and occasionally hematuria but never proteinuria. He denies obstructive like symptoms but does have urgency with no fullness after voiding. He also denies dysuria, hematuria or "foamy urine." He has chronic lower back pain but only takes Tylenol occasionally and denies any NSAID use. He has had surgical extraction of a golf ball sized stone over a year ago as well as a ureteral stent previously on the right side. There is no history of being on dialysis or any family members with ESRD. He also denies any recent episode of fever, chills, or a sore throat. Of note since receiving a course of chemotherapy earlier this month he has had nausea (no vomiting), watery stool in the ileostomy, anorexia losing 28 lbs over the course of 3-4 weeks. He denies any dyspnea, cough but has decreased exercise tolerance secondary to  fatigue. He also has dizziness especially when standing but denies any loss of consciousness or syncopal episodes.   Primary Nephrologist Dr. Annie Sable   Past Medical History  Diagnosis Date  . Coronary artery disease   . MI, old     AGE 24  . Ulcerative colitis   . Hyperlipidemia   . Hypertension   . History of nephrolithiasis   . PAF (paroxysmal atrial fibrillation)   . LV dysfunction   . Myocardial infarction   . Anemia   . Blood transfusion   . Arthritis   . CHF (congestive heart failure)   . Mitral regurgitation   . Pneumonia     years ago  . Peripheral vascular disease   . 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 Acute renal failure     "related ? Kidney stone blockage"  . Gout flare     right index finger-tx. prednisone, allopurinol  . Swelling 05-08-13    rt. base of neck-something recent.  . Cancer 05/29/13    metastatic adenocarcinoma of right neck mass  . Metastatic adenocarcinoma 05/29/13    right supraclavicular area    Medications:  Prior to Admission:  Prescriptions prior to admission  Medication Sig Dispense Refill  . allopurinol (ZYLOPRIM) 100 MG tablet Take 100 mg by mouth at bedtime.       [name] amiodarone (PACERONE) 200 MG tablet Take 0.5 tablets (100 mg total) by mouth daily.  30 tablet  1  . atorvastatin (LIPITOR) 40 MG tablet Take 20 mg by mouth every evening. 1/2 tablet daily      . carvedilol (COREG) 6.25 MG tablet Take 3.125 mg by mouth 2 (two) times daily with a meal.       . cholecalciferol (VITAMIN D) 1000 UNITS tablet Take 1,000 Units by mouth every morning.       . ferrous sulfate 325 (65 FE) MG tablet Take 325 mg by mouth every evening.      . fish oil-omega-3 fatty acids 1000 MG capsule Take 1 g by mouth 2 (two) times daily.       [name] FLAXSEED, LINSEED, PO Take 1 tablet by mouth at bedtime.       . folic acid (FOLVITE) 400 MCG tablet Take 400 mcg by mouth every morning.       . isosorbide mononitrate (IMDUR) 60 MG 24 hr tablet Take 30 mg by mouth every evening.       . nitroGLYCERIN (NITROSTAT) 0.4 MG SL tablet Place 1 tablet (0.4 mg total) under the tongue every 5 (five)  minutes as needed for chest pain.  30 tablet  0  . potassium chloride SA (K-DUR,KLOR-CON) 20 MEQ tablet Take 10 mEq by mouth 2 (two) times daily.       . Tamsulosin HCl (FLOMAX) 0.4 MG CAPS Take 0.4 mg by mouth daily after supper.        Scheduled: . sodium chloride   Intravenous STAT  . cefTRIAXone        Allergies: No Known Allergies  Past Surgical History  Procedure Laterality Date  . Cardiac catheterization  10/14/2010    EF 30%  . Coronary artery bypass graft  10/24/2010    LIMA TO THE LAD, A SAPHENOUS VEIN GRAFT TO THE DIAGONAL 1 AND 3, AND SAPHENOUS VEIN GRAFT TO THE DISTAL RIGHT CORONARY ARTERY  . Ileostomy  04/23/73  . Colectomy    . Cataract extraction    . Transthoracic echocardiogram  01/16/2011    EF 50%  . Cardiovascular stress test  10/07/2010    EF 33%  . Appendectomy    . Cystostomy  04/22/2012    Procedure: CYSTOSTOMY SUPRAPUBIC;  Surgeon: Garnett Farm, MD;  Location: [facility];  Service: Urology;  Laterality: N/A;  CYSTOTOMY WITH MIDLINE INCISION   . Tee without cardioversion  08/28/2012    Procedure: TRANSESOPHAGEAL ECHOCARDIOGRAM (TEE);  Surgeon: [name], MD;  Location: [facility] ENDOSCOPY;  Service: Cardiovascular;  Laterality: N/A;  . Cardioversion  08/28/2012    Procedure: CARDIOVERSION;  Surgeon: [name], MD;  Location: [facility] ENDOSCOPY;  Service: Cardiovascular;  Laterality: N/A;  . Cystoscopy w/ ureteral stent placement Right 05/12/2013    Procedure: CYSTOSCOPY WITH RIGHT RETROGRADE PYELOGRAM/ RIGHT URETERAL STENT PLACEMENT;  Surgeon: Garnett Farm, MD;  Location: [facility];  Service: Urology;  Laterality: Right;    Family History  Problem Relation Age of Onset  . Colon cancer Mother     colon  . Heart attack Father   . Hypertension Father   . Heart disease Sister   . Hypertension Sister   . Heart disease Brother   . Hypertension Brother   . Heart disease Sister   . Hypertension Sister   . Heart disease Sister   . Hypertension Sister   . Heart  disease Brother   . Hypertension Brother   . Heart disease Brother   . Hypertension Brother   . Heart disease Brother   . Hypertension Brother   . Heart disease Brother   . Hypertension Brother   . Heart disease Brother   . Hypertension Brother   . Heart disease Brother   . Hypertension Brother   . Heart disease Brother   . Hypertension Brother   . Stroke Brother     Social History:  reports that he quit smoking about 44 years ago. His smoking use included Cigarettes and Cigars. He has a 15 pack-year smoking history. He does not have any smokeless tobacco history on file. He reports that he does not drink alcohol or use illicit drugs.  Review of Systems  Constitutional: Positive for weight loss and malaise/fatigue. Negative for fever, chills and diaphoresis.  HENT: Positive for ear discharge. Negative for hearing loss and sore throat.   Eyes: Negative for blurred vision, double vision, photophobia and pain.  Respiratory: Negative for cough, hemoptysis, sputum production and shortness of breath.   Cardiovascular: Negative for chest pain, orthopnea, claudication and leg swelling.  Gastrointestinal: Positive for nausea and diarrhea. Negative for heartburn, vomiting, abdominal pain and blood in stool.  Genitourinary: Positive for urgency. Negative for dysuria, hematuria and flank pain.  Musculoskeletal: Positive for back pain. Negative for myalgias and neck pain.  Skin: Negative for itching and rash.  Neurological: Positive for dizziness, tremors and weakness. Negative for tingling, sensory change, speech change, seizures, loss of consciousness and headaches.  Endo/Heme/Allergies: Negative for polydipsia. Bruises/bleeds easily.  Psychiatric/Behavioral: Negative for depression and suicidal ideas.    Blood pressure 107/56, pulse 116, temperature 97.8 F (36.6 C), temperature source Oral, resp. rate 13, height 5\' 4"  (1.626 m), weight 55.566 kg (122 lb 8 oz), SpO2 99.00%.  Physical Exam   Constitutional: He is oriented to person, place, and time. No distress.  HENT:  Head: Normocephalic and atraumatic.  Eyes: Pupils are equal, round, and reactive to light. Right eye exhibits no discharge. No scleral icterus.  Neck: Normal range of motion. Neck supple. No tracheal deviation present. No thyromegaly present.  Cardiovascular:  Tachycardic but regular  Respiratory: No stridor. No respiratory distress. He has no wheezes. He exhibits no tenderness.  GI: He exhibits no distension. There is no tenderness. There is no rebound.  Genitourinary: Penis normal.  Musculoskeletal: Normal range of motion. He exhibits no edema and no tenderness.  Lymphadenopathy:    He has cervical adenopathy.  Neurological: He is oriented to person, place, and time. He has normal reflexes. No cranial nerve deficit.  Skin: Skin is dry. Rash noted. He is not diaphoretic. No erythema.  Psychiatric: He has a normal mood and affect. His behavior is normal.     Results for orders placed during the hospital encounter of 10/22/13 (from the past 48 hour(s))  CBC WITH DIFFERENTIAL     Status: Abnormal   Collection Time    10/22/13  3:30 PM      Result Value Range   WBC 10.7 (*) 4.0 - 10.5 K/uL   RBC 3.43 (*) 4.22 - 5.81 MIL/uL   Hemoglobin 10.4 (*) 13.0 - 17.0 g/dL   HCT 16.1 (*) 09.6 - 04.5 %   MCV 89.2  78.0 - 100.0 fL   MCH 30.3  26.0 - 34.0 pg   MCHC 34.0  30.0 - 36.0 g/dL   RDW 40.9 (*) 81.1 - 91.4 %   Platelets 45 (*) 150 - 400 K/uL   Comment: PLATELET COUNT CONFIRMED BY SMEAR     REPEATED TO VERIFY   Neutrophils Relative % 86 (*) 43 - 77 %   Lymphocytes Relative 6 (*) 12 - 46 %   Monocytes Relative 6  3 - 12 %   Eosinophils Relative 0  0 - 5 %   Basophils Relative 0  0 - 1 %   Band Neutrophils 2  0 - 10 %   Neutro Abs 9.5 (*) 1.7 - 7.7 K/uL   Lymphs Abs 0.6 (*) 0.7 - 4.0 K/uL   Monocytes Absolute 0.6  0.1 - 1.0 K/uL   Eosinophils Absolute 0.0  0.0 - 0.7 K/uL   Basophils Absolute 0.0  0.0 -  0.1 K/uL  COMPREHENSIVE METABOLIC PANEL     Status: Abnormal   Collection Time    10/22/13  3:30 PM      Result Value Range   Sodium 130 (*) 135 - 145 mEq/L   Potassium 6.0 (*) 3.5 - 5.1 mEq/L   Chloride 92 (*) 96 - 112 mEq/L   CO2 21  19 - 32 mEq/L   Glucose, Bld 131 (*) 70 - 99 mg/dL   BUN 66 (*) 6 - 23 mg/dL   Creatinine, Ser 7.82 (*) 0.50 - 1.35 mg/dL   Calcium 9.4  8.4 - 95.6 mg/dL   Total Protein 6.6  6.0 - 8.3 g/dL   Albumin 2.8 (*) 3.5 - 5.2 g/dL   AST 23  0 - 37 U/L   ALT 20  0 - 53 U/L   Alkaline Phosphatase 98  39 - 117 U/L   Total Bilirubin 0.4  0.3 - 1.2 mg/dL   GFR calc non Af Amer 8 (*) >90 mL/min   GFR calc Af Amer 9 (*) >90 mL/min   Comment: (NOTE)     The eGFR has been calculated using the CKD EPI equation.     This calculation has not been validated in all clinical situations.     eGFR's persistently <90 mL/min signify possible Chronic Kidney     Disease.  PROTIME-INR     Status: None  Collection Time    10/22/13  3:33 PM      Result Value Range   Prothrombin Time 13.5  11.6 - 15.2 seconds   INR 1.05  0.00 - 1.49  URINALYSIS, ROUTINE W REFLEX MICROSCOPIC     Status: Abnormal   Collection Time    10/22/13  4:40 PM      Result Value Range   Color, Urine AMBER (*) YELLOW   Comment: BIOCHEMICALS MAY BE AFFECTED BY COLOR   APPearance TURBID (*) CLEAR   Specific Gravity, Urine 1.016  1.005 - 1.030   pH 6.0  5.0 - 8.0   Glucose, UA NEGATIVE  NEGATIVE mg/dL   Hgb urine dipstick LARGE (*) NEGATIVE   Bilirubin Urine NEGATIVE  NEGATIVE   Ketones, ur 15 (*) NEGATIVE mg/dL   Protein, ur >409 (*) NEGATIVE mg/dL   Urobilinogen, UA 0.2  0.0 - 1.0 mg/dL   Nitrite NEGATIVE  NEGATIVE   Leukocytes, UA LARGE (*) NEGATIVE  URINE MICROSCOPIC-ADD ON     Status: Abnormal   Collection Time    10/22/13  4:40 PM      Result Value Range   Squamous Epithelial / LPF FEW (*) RARE   WBC, UA TOO NUMEROUS TO COUNT  <3 WBC/hpf   RBC / HPF 11-20  <3 RBC/hpf   Bacteria, UA FEW  (*) RARE    Dg Chest Port 1 View  10/22/2013   CLINICAL DATA:  Renal failure.  Lung cancer.  EXAM: PORTABLE CHEST - 1 VIEW  COMPARISON:  Chest x-ray 10/12/2012  FINDINGS: Left Port-A-Cath is in place with the tip in the SVC near the cavoatrial junction. Prior median sternotomy. Heart is normal size. No focal airspace opacities or visible effusion. No acute bony abnormality.  IMPRESSION: No active disease.   Electronically Signed   By: Charlett Nose M.D.   On: 10/22/2013 16:28     Thank you for allowing me to participate in the care of this pleasant patient. Will follow closely with you.  Ethelene Hal, MD 10/22/2013, 9:51 PM

## 2013-10-22 NOTE — Consult Note (Signed)
Cardiology Consult Note METHENEY,CATHERINE, MD No ref. provider found  Reason for consult: atrial arrythmia  History of Present Illness (and review of medical records): [name] [name] is a 79 y.o. male who presents for evaluation of dizziness.  He has hx of CAD s/p CABG, ischemic cardiomyopathy, severe MR and paroxysmal atrial fibrillation.  He also has metastatic adenocarcinoma of the lung status post 1 cycle of carboplatin/alimta.  He was recently discharged after recent hospital course complicated by ongoing neutropenia and thrombocytopenia.  He has also had prior admission with acute on chronic renal failure with hypovolemia and hyperkalemia.  He presented with dizziness and was found to have Cr of 5 and K of 6.  He was in atrial flutter HR in 120s.  He denied any chest pain or shortness of breath or palpitations.  He is now comfortable in stepdown.  We were asked to consult.  He has been seen by nephrology.  Previous studies: Echo 04/2013: Study Conclusions - Left ventricle: The cavity size was mildly dilated. Wall thickness was increased in a pattern of mild LVH. Systolic function was mildly reduced. The estimated ejection fraction was in the range of 45% to 50%. There was an increased relative contribution of atrial contraction to ventricular filling. - Mitral valve: Mild regurgitation. - Left atrium: The atrium was moderately dilated. - Right atrium: The atrium was mildly to moderately dilated.  Nuclear stress 08/2012: Impression  Exercise Capacity: Lexiscan with low level exercise.  BP Response: Normal blood pressure response.  Clinical Symptoms: No significant symptoms noted.  ECG Impression: No significant ST segment change suggestive of ischemia.  Comparison with Prior Nuclear Study: No images to compare  Overall Impression: Low risk stress nuclear study. 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 LV Wall Motion: Diffuse  hypokinesis worse in the inferior wall  Review of Systems Further review of systems was otherwise negative other than stated in HPI.  Patient Active Problem List   Diagnosis Date Noted  . Anemia of chronic disease 10/12/2013  . Antineoplastic chemotherapy induced pancytopenia 10/12/2013  . Chronic systolic CHF (congestive heart failure) 10/12/2013  .   Epistaxis and associated Acute blood loss anemia in the setting of thrombocytopenia from anti-neoplastic chemotherapy 10/11/2013  . Metastatic adenocarcinoma   . Gouty tophi of joint 02/03/2013  . Paroxysmal atrial fibrillation 10/12/2012  . CKD (chronic kidney disease) 08/15/2012  . Mitral regurgitation 03/07/2012  . HYPERLIPIDEMIA 08/04/2010  . Essential hypertension, benign 03/30/2010  . CAD 03/30/2010   Past Medical History  Diagnosis Date  . Coronary artery disease   . MI, old     AGE 88  . Ulcerative colitis   . Hyperlipidemia   . Hypertension   . History of nephrolithiasis   . PAF (paroxysmal atrial fibrillation)   . LV dysfunction   . Myocardial infarction   . Anemia   . Blood transfusion   . Arthritis   . CHF (congestive heart failure)   . Mitral regurgitation   . Pneumonia     years ago  . Peripheral vascular disease   . 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 Acute renal failure     "related ? Kidney stone blockage"  . Gout flare     right index finger-tx. prednisone, allopurinol  . Swelling 05-08-13    rt. base of neck-something recent.  . Cancer 05/29/13    metastatic adenocarcinoma of right neck mass  . Metastatic  adenocarcinoma 05/29/13    right supraclavicular area    Past Surgical History  Procedure Laterality Date  . Cardiac catheterization  10/14/2010    EF 30%  . Coronary artery bypass graft  10/24/2010    LIMA TO THE LAD, A SAPHENOUS VEIN GRAFT TO THE DIAGONAL 1 AND 3, AND SAPHENOUS VEIN GRAFT TO THE DISTAL RIGHT CORONARY ARTERY  . Ileostomy  04/23/73  . Colectomy    . Cataract  extraction    . Transthoracic echocardiogram  01/16/2011    EF 50%  . Cardiovascular stress test  10/07/2010    EF 33%  . Appendectomy    . Cystostomy  04/22/2012    Procedure: CYSTOSTOMY SUPRAPUBIC;  Surgeon: Garnett Farm, MD;  Location: [facility];  Service: Urology;  Laterality: N/A;  CYSTOTOMY WITH MIDLINE INCISION   . Tee without cardioversion  08/28/2012    Procedure: TRANSESOPHAGEAL ECHOCARDIOGRAM (TEE);  Surgeon: [name], MD;  Location: [facility];  Service: Cardiovascular;  Laterality: N/A;  . Cardioversion  08/28/2012    Procedure: CARDIOVERSION;  Surgeon: [name], MD;  Location: [facility];  Service: Cardiovascular;  Laterality: N/A;  . Cystoscopy w/ ureteral stent placement Right 05/12/2013    Procedure: CYSTOSCOPY WITH RIGHT RETROGRADE PYELOGRAM/ RIGHT URETERAL STENT PLACEMENT;  Surgeon: Garnett Farm, MD;  Location: [facility];  Service: Urology;  Laterality: Right;    Prescriptions prior to admission  Medication Sig Dispense Refill  . allopurinol (ZYLOPRIM) 100 MG tablet Take 100 mg by mouth at bedtime.       [name] amiodarone (PACERONE) 200 MG tablet Take 0.5 tablets (100 mg total) by mouth daily.  30 tablet  1  . atorvastatin (LIPITOR) 40 MG tablet Take 20 mg by mouth every evening. 1/2 tablet daily      . carvedilol (COREG) 6.25 MG tablet Take 3.125 mg by mouth 2 (two) times daily with a meal.       . cholecalciferol (VITAMIN D) 1000 UNITS tablet Take 1,000 Units by mouth every morning.       . ferrous sulfate 325 (65 FE) MG tablet Take 325 mg by mouth every evening.      . fish oil-omega-3 fatty acids 1000 MG capsule Take 1 g by mouth 2 (two) times daily.       [name] FLAXSEED, LINSEED, PO Take 1 tablet by mouth at bedtime.       . folic acid (FOLVITE) 400 MCG tablet Take 400 mcg by mouth every morning.       . isosorbide mononitrate (IMDUR) 60 MG 24 hr tablet Take 30 mg by mouth every evening.       . nitroGLYCERIN (NITROSTAT) 0.4 MG SL tablet Place 1 tablet (0.4 mg  total) under the tongue every 5 (five) minutes as needed for chest pain.  30 tablet  0  . potassium chloride SA (K-DUR,KLOR-CON) 20 MEQ tablet Take 10 mEq by mouth 2 (two) times daily.       . Tamsulosin HCl (FLOMAX) 0.4 MG CAPS Take 0.4 mg by mouth daily after supper.        No Known Allergies  History  Substance Use Topics  . Smoking status: Former Smoker -- 1.00 packs/day for 15 years    Types: Cigarettes, Cigars    Quit date: 12/25/1968  . Smokeless tobacco: Not on file  . Alcohol Use: No     Comment: occasional glass of wine    Family History  Problem Relation Age of Onset  .  Colon cancer Mother     colon  . Heart attack Father   . Hypertension Father   . Heart disease Sister   . Hypertension Sister   . Heart disease Brother   . Hypertension Brother   . Heart disease Sister   . Hypertension Sister   . Heart disease Sister   . Hypertension Sister   . Heart disease Brother   . Hypertension Brother   . Heart disease Brother   . Hypertension Brother   . Heart disease Brother   . Hypertension Brother   . Heart disease Brother   . Hypertension Brother   . Heart disease Brother   . Hypertension Brother   . Heart disease Brother   . Hypertension Brother   . Heart disease Brother   . Hypertension Brother   . Stroke Brother      Objective:  Patient Vitals for the past 8 hrs:  BP Temp Temp src Pulse Resp SpO2 Height Weight  10/22/13 1945 107/56 mmHg 97.8 F (36.6 C) Oral 116 13 99 % - -  10/22/13 1831 101/72 mmHg 97.7 F (36.5 C) Oral 119 12 100 % - -  10/22/13 1706 113/76 mmHg - - 119 24 100 % - -  10/22/13 1504 97/61 mmHg 98.4 F (36.9 C) Oral 74 18 100 % 5\' 4"  (1.626 m) 55.566 kg (122 lb 8 oz)   General appearance: alert, cooperative, elderly appearing male and no distress Head: Normocephalic, without obvious abnormality, atraumatic Eyes: PERRL, EOM's intact.  Lungs: clear to auscultation bilaterally Chest wall: no tenderness Heart: tachycardiac S1, S2  normal, Abdomen: soft, non-tender; bowel sounds normal; no masses,  no organomegaly Extremities: extremities normal, atraumatic, no cyanosis or edema Pulses: 2+ and symmetric Neurologic: Grossly normal  Results for orders placed during the hospital encounter of 10/22/13 (from the past 48 hour(s))  CBC WITH DIFFERENTIAL     Status: Abnormal   Collection Time    10/22/13  3:30 PM      Result Value Range   WBC 10.7 (*) 4.0 - 10.5 K/uL   RBC 3.43 (*) 4.22 - 5.81 MIL/uL   Hemoglobin 10.4 (*) 13.0 - 17.0 g/dL   HCT 16.1 (*) 09.6 - 04.5 %   MCV 89.2  78.0 - 100.0 fL   MCH 30.3  26.0 - 34.0 pg   MCHC 34.0  30.0 - 36.0 g/dL   RDW 40.9 (*) 81.1 - 91.4 %   Platelets 45 (*) 150 - 400 K/uL   Comment: PLATELET COUNT CONFIRMED BY SMEAR     REPEATED TO VERIFY   Neutrophils Relative % 86 (*) 43 - 77 %   Lymphocytes Relative 6 (*) 12 - 46 %   Monocytes Relative 6  3 - 12 %   Eosinophils Relative 0  0 - 5 %   Basophils Relative 0  0 - 1 %   Band Neutrophils 2  0 - 10 %   Neutro Abs 9.5 (*) 1.7 - 7.7 K/uL   Lymphs Abs 0.6 (*) 0.7 - 4.0 K/uL   Monocytes Absolute 0.6  0.1 - 1.0 K/uL   Eosinophils Absolute 0.0  0.0 - 0.7 K/uL   Basophils Absolute 0.0  0.0 - 0.1 K/uL  COMPREHENSIVE METABOLIC PANEL     Status: Abnormal   Collection Time    10/22/13  3:30 PM      Result Value Range   Sodium 130 (*) 135 - 145 mEq/L   Potassium 6.0 (*) 3.5 - 5.1 mEq/L  Chloride 92 (*) 96 - 112 mEq/L   CO2 21  19 - 32 mEq/L   Glucose, Bld 131 (*) 70 - 99 mg/dL   BUN 66 (*) 6 - 23 mg/dL   Creatinine, Ser 1.61 (*) 0.50 - 1.35 mg/dL   Calcium 9.4  8.4 - 09.6 mg/dL   Total Protein 6.6  6.0 - 8.3 g/dL   Albumin 2.8 (*) 3.5 - 5.2 g/dL   AST 23  0 - 37 U/L   ALT 20  0 - 53 U/L   Alkaline Phosphatase 98  39 - 117 U/L   Total Bilirubin 0.4  0.3 - 1.2 mg/dL   GFR calc non Af Amer 8 (*) >90 mL/min   GFR calc Af Amer 9 (*) >90 mL/min   Comment: (NOTE)     The eGFR has been calculated using the CKD EPI equation.      This calculation has not been validated in all clinical situations.     eGFR's persistently <90 mL/min signify possible Chronic Kidney     Disease.  PROTIME-INR     Status: None   Collection Time    10/22/13  3:33 PM      Result Value Range   Prothrombin Time 13.5  11.6 - 15.2 seconds   INR 1.05  0.00 - 1.49  URINALYSIS, ROUTINE W REFLEX MICROSCOPIC     Status: Abnormal   Collection Time    10/22/13  4:40 PM      Result Value Range   Color, Urine AMBER (*) YELLOW   Comment: BIOCHEMICALS MAY BE AFFECTED BY COLOR   APPearance TURBID (*) CLEAR   Specific Gravity, Urine 1.016  1.005 - 1.030   pH 6.0  5.0 - 8.0   Glucose, UA NEGATIVE  NEGATIVE mg/dL   Hgb urine dipstick LARGE (*) NEGATIVE   Bilirubin Urine NEGATIVE  NEGATIVE   Ketones, ur 15 (*) NEGATIVE mg/dL   Protein, ur >045 (*) NEGATIVE mg/dL   Urobilinogen, UA 0.2  0.0 - 1.0 mg/dL   Nitrite NEGATIVE  NEGATIVE   Leukocytes, UA LARGE (*) NEGATIVE  URINE MICROSCOPIC-ADD ON     Status: Abnormal   Collection Time    10/22/13  4:40 PM      Result Value Range   Squamous Epithelial / LPF FEW (*) RARE   WBC, UA TOO NUMEROUS TO COUNT  <3 WBC/hpf   RBC / HPF 11-20  <3 RBC/hpf   Bacteria, UA FEW (*) RARE   Dg Chest Port 1 View  10/22/2013   CLINICAL DATA:  Renal failure.  Lung cancer.  EXAM: PORTABLE CHEST - 1 VIEW  COMPARISON:  Chest x-ray 10/12/2012  FINDINGS: Left Port-A-Cath is in place with the tip in the SVC near the cavoatrial junction. Prior median sternotomy. Heart is normal size. No focal airspace opacities or visible effusion. No acute bony abnormality.  IMPRESSION: No active disease.   Electronically Signed   By: Charlett Nose M.D.   On: 10/22/2013 16:28    ECG:  Initial ecg revealed suspected Atrial flutter with variable AV block HR 124. Telemetry: possible sinus tach HR 120s  Impression: Afib/Atrial flutter with variable AV conduction, hx of Afib on amiodarone. Recently had coumadin d/c due to bleeding along with  pancytopenia. AKI on CKD Hyperkalemia  Recommendations: -Suspect that HR will improved with volume resuscitation and management of electrolyetes. -Repeat ecg as appears to be in sinus tach.  Continue to monitor on telemetry for any change in rhythm given renal  failure and hyperkalemia. -Continue Amiodarone for hx of PAF. Can Give bolus of 150mg  IV and place on gtt Continue to hold coumadin given recent bleeding and pancytopenia.  Please call with questions.

## 2013-10-22 NOTE — ED Notes (Signed)
Attempted to call reports to [facility]-placed on hold x 6 min then disconnected

## 2013-10-22 NOTE — Progress Notes (Signed)
This office note has been dictated.

## 2013-10-22 NOTE — ED Notes (Signed)
Sent here from oncology office for eval of elevated BUN and CRT

## 2013-10-22 NOTE — ED Notes (Signed)
MD at bedside. 

## 2013-10-22 NOTE — ED Notes (Signed)
Pt c/o pain at foley site-EDP Belfi in to see pt-advised can be removed

## 2013-10-22 NOTE — ED Provider Notes (Signed)
CSN: [id number]     Arrival date & time 10/22/13  1457 History   First MD Initiated Contact with Patient 10/22/13 1505     Chief Complaint  Patient presents with  . Abnormal Lab   (Consider location/radiation/quality/duration/timing/severity/associated sxs/prior Treatment) HPI Comments: Patient presents from Dr. Tama Gander office with an elevated creatinine. He was recently in the hospital for throbocytopenia and an elevated INR with associated bleeding. This was one week ago. He's since then been off his Coumadin. He has a history of adenocarcinoma of the lung and currently is being treated with chemotherapy. His last chemotherapy was the first part of October. He does have a history of stage III chronic kidney disease with baseline creatinine around 2. He was supposed to have a hospital followup appointment tomorrow but today he felt like he needed to be seen sooner because he was feeling fatigued and lightheaded. He says he gets dizzy and lightheaded when he stands up. He's also had a tremor. He denies any chest pain or shortness of breath. He denies any palpitations. He denies any unilateral deficits. He denies any speech deficits. He denies a headache. He denies a spinning sensation. He denies any recent fevers. He had blood work done by Dr. Myna Hidalgo today and was noted that his creatinine was 5.9 and his potassium is elevated at 6.3. His BUN was also elevated at 66. He was sent here to the emergency pertinent for evaluation. He does have a history of kidney stones but denies any abdominal pain. He states he's had an sense of urinary urgency but is only peeing small amounts. He denies any pain on urination. He denies he fevers or chills. He denies any history of renal obstruction in the past.   Past Medical History  Diagnosis Date  . Coronary artery disease   . MI, old     AGE 30  . Ulcerative colitis   . Hyperlipidemia   . Hypertension   . History of nephrolithiasis   . PAF (paroxysmal atrial  fibrillation)   . LV dysfunction   . Myocardial infarction   . Anemia   . Blood transfusion   . Arthritis   . CHF (congestive heart failure)   . Mitral regurgitation   . Pneumonia     years ago  . Peripheral vascular disease   . 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 Acute renal failure     "related ? Kidney stone blockage"  . Gout flare     right index finger-tx. prednisone, allopurinol  . Swelling 05-08-13    rt. base of neck-something recent.  . Cancer 05/29/13    metastatic adenocarcinoma of right neck mass  . Metastatic adenocarcinoma 05/29/13    right supraclavicular area   Past Surgical History  Procedure Laterality Date  . Cardiac catheterization  10/14/2010    EF 30%  . Coronary artery bypass graft  10/24/2010    LIMA TO THE LAD, A SAPHENOUS VEIN GRAFT TO THE DIAGONAL 1 AND 3, AND SAPHENOUS VEIN GRAFT TO THE DISTAL RIGHT CORONARY ARTERY  . Ileostomy  04/23/73  . Colectomy    . Cataract extraction    . Transthoracic echocardiogram  01/16/2011    EF 50%  . Cardiovascular stress test  10/07/2010    EF 33%  . Appendectomy    . Cystostomy  04/22/2012    Procedure: CYSTOSTOMY SUPRAPUBIC;  Surgeon: Garnett Farm, MD;  Location: [facility];  Service: Urology;  Laterality: N/A;  CYSTOTOMY  WITH MIDLINE INCISION   . Tee without cardioversion  08/28/2012    Procedure: TRANSESOPHAGEAL ECHOCARDIOGRAM (TEE);  Surgeon: [name], MD;  Location: [facility] ENDOSCOPY;  Service: Cardiovascular;  Laterality: N/A;  . Cardioversion  08/28/2012    Procedure: CARDIOVERSION;  Surgeon: [name], MD;  Location: [facility] ENDOSCOPY;  Service: Cardiovascular;  Laterality: N/A;  . Cystoscopy w/ ureteral stent placement Right 05/12/2013    Procedure: CYSTOSCOPY WITH RIGHT RETROGRADE PYELOGRAM/ RIGHT URETERAL STENT PLACEMENT;  Surgeon: Garnett Farm, MD;  Location: [facility];  Service: Urology;  Laterality: Right;   Family History  Problem Relation Age of Onset  . Colon cancer Mother     colon  .  Heart attack Father   . Hypertension Father   . Heart disease Sister   . Hypertension Sister   . Heart disease Brother   . Hypertension Brother   . Heart disease Sister   . Hypertension Sister   . Heart disease Sister   . Hypertension Sister   . Heart disease Brother   . Hypertension Brother   . Heart disease Brother   . Hypertension Brother   . Heart disease Brother   . Hypertension Brother   . Heart disease Brother   . Hypertension Brother   . Heart disease Brother   . Hypertension Brother   . Heart disease Brother   . Hypertension Brother   . Heart disease Brother   . Hypertension Brother   . Stroke Brother    History  Substance Use Topics  . Smoking status: Former Smoker -- 1.00 packs/day for 15 years    Types: Cigarettes, Cigars    Quit date: 12/25/1968  . Smokeless tobacco: Not on file  . Alcohol Use: No     Comment: occasional glass of wine    Review of Systems  Constitutional: Positive for fatigue. Negative for fever, chills and diaphoresis.  HENT: Negative for congestion, rhinorrhea and sneezing.   Eyes: Negative.   Respiratory: Negative for cough, chest tightness and shortness of breath.   Cardiovascular: Negative for chest pain and leg swelling.  Gastrointestinal: Positive for nausea (chronic since on chemo). Negative for vomiting, abdominal pain, diarrhea and blood in stool.  Genitourinary: Positive for urgency, frequency and decreased urine volume. Negative for hematuria, flank pain, difficulty urinating and testicular pain.  Musculoskeletal: Negative for arthralgias and back pain.  Skin: Negative for rash.  Neurological: Positive for dizziness, tremors and light-headedness. Negative for speech difficulty, weakness, numbness and headaches.    Allergies  Review of patient's allergies indicates no known allergies.  Home Medications   Current Outpatient Rx  Name  Route  Sig  Dispense  Refill  . warfarin (COUMADIN) 5 MG tablet   Oral   Take 5 mg by  mouth daily.         [name] allopurinol (ZYLOPRIM) 100 MG tablet   Oral   Take 100 mg by mouth at bedtime.          [name] amiodarone (PACERONE) 200 MG tablet   Oral   Take 0.5 tablets (100 mg total) by mouth daily.   30 tablet   1   . atorvastatin (LIPITOR) 40 MG tablet   Oral   Take 20 mg by mouth every evening. 1/2 tablet daily         . carvedilol (COREG) 6.25 MG tablet   Oral   Take 3.125 mg by mouth 2 (two) times daily with a meal.          .  cholecalciferol (VITAMIN D) 1000 UNITS tablet   Oral   Take 1,000 Units by mouth every morning.          . ferrous sulfate 325 (65 FE) MG tablet   Oral   Take 325 mg by mouth every evening.         . fish oil-omega-3 fatty acids 1000 MG capsule   Oral   Take 1 g by mouth 2 (two) times daily.          [name] FLAXSEED, LINSEED, PO   Oral   Take 1 tablet by mouth at bedtime.          . folic acid (FOLVITE) 400 MCG tablet   Oral   Take 400 mcg by mouth every morning.          . isosorbide mononitrate (IMDUR) 60 MG 24 hr tablet   Oral   Take 30 mg by mouth every evening.          . nitroGLYCERIN (NITROSTAT) 0.4 MG SL tablet   Sublingual   Place 1 tablet (0.4 mg total) under the tongue every 5 (five) minutes as needed for chest pain.   30 tablet   0   . potassium chloride SA (K-DUR,KLOR-CON) 20 MEQ tablet   Oral   Take 10 mEq by mouth 2 (two) times daily.          . Tamsulosin HCl (FLOMAX) 0.4 MG CAPS   Oral   Take 0.4 mg by mouth every other day.           BP 113/76  Pulse 119  Temp(Src) 98.4 F (36.9 C) (Oral)  Resp 24  Ht 5\' 4"  (1.626 m)  Wt 122 lb 8 oz (55.566 kg)  BMI 21.02 kg/m2  SpO2 100% Physical Exam  Constitutional: He is oriented to person, place, and time. He appears well-developed and well-nourished.  HENT:  Head: Normocephalic and atraumatic.  Eyes: Pupils are equal, round, and reactive to light.  Neck: Normal range of motion. Neck supple.  Cardiovascular: Normal rate, regular  rhythm and normal heart sounds.   Pulmonary/Chest: Effort normal and breath sounds normal. No respiratory distress. He has no wheezes. He has no rales. He exhibits no tenderness.  Abdominal: Soft. Bowel sounds are normal. There is no tenderness. There is no rebound and no guarding.  Ileostomy bag in place  Musculoskeletal: Normal range of motion. He exhibits no edema.  Lymphadenopathy:    He has no cervical adenopathy.  Neurological: He is alert and oriented to person, place, and time. He has normal strength. No cranial nerve deficit or sensory deficit. GCS eye subscore is 4. GCS verbal subscore is 5. GCS motor subscore is 6.  No tremor noted. Finger to nose intact. No pronator drift.  Skin: Skin is warm and dry. No rash noted.  Psychiatric: He has a normal mood and affect.    ED Course  Procedures (including critical care time) Labs Review Labs Reviewed  CBC WITH DIFFERENTIAL - Abnormal; Notable for the following:    WBC 10.7 (*)    RBC 3.43 (*)    Hemoglobin 10.4 (*)    HCT 30.6 (*)    RDW 16.3 (*)    Platelets 45 (*)    Neutrophils Relative % 86 (*)    Lymphocytes Relative 6 (*)    Neutro Abs 9.5 (*)    Lymphs Abs 0.6 (*)    All other components within normal limits  COMPREHENSIVE METABOLIC PANEL - Abnormal; Notable for  the following:    Sodium 130 (*)    Potassium 6.0 (*)    Chloride 92 (*)    Glucose, Bld 131 (*)    BUN 66 (*)    Creatinine, Ser 5.90 (*)    Albumin 2.8 (*)    GFR calc non Af Amer 8 (*)    GFR calc Af Amer 9 (*)    All other components within normal limits  URINALYSIS, ROUTINE W REFLEX MICROSCOPIC - Abnormal; Notable for the following:    Color, Urine AMBER (*)    APPearance TURBID (*)    Hgb urine dipstick LARGE (*)    Ketones, ur 15 (*)    Protein, ur >300 (*)    Leukocytes, UA LARGE (*)    All other components within normal limits  URINE MICROSCOPIC-ADD ON - Abnormal; Notable for the following:    Squamous Epithelial / LPF FEW (*)    Bacteria,  UA FEW (*)    All other components within normal limits  URINE CULTURE  PROTIME-INR   Imaging Review Dg Chest Port 1 View  10/22/2013   CLINICAL DATA:  Renal failure.  Lung cancer.  EXAM: PORTABLE CHEST - 1 VIEW  COMPARISON:  Chest x-ray 10/12/2012  FINDINGS: Left Port-A-Cath is in place with the tip in the SVC near the cavoatrial junction. Prior median sternotomy. Heart is normal size. No focal airspace opacities or visible effusion. No acute bony abnormality.  IMPRESSION: No active disease.   Electronically Signed   By: Charlett Nose M.D.   On: 10/22/2013 16:28    EKG Interpretation   None       Date: 10/22/2013  Rate: 124  Rhythm: atrial flutter  QRS Axis: normal  Intervals: normal  ST/T Wave abnormalities: nonspecific ST/T changes  Conduction Disutrbances:right bundle branch block  Narrative Interpretation:   Old EKG Reviewed: changes noted   MDM   1. Hyperkalemia   2. Acute renal failure   3. Atrial flutter    Patient presents with acute renal failure and hyperkalemia. He was given IV fluid replacement. He has no evidence of pulmonary edema. A Foley catheter was placed and there was only a small amount of urine returned. The patient complained of significant pain from the catheter and we tried to have him maintain the catheter however he was adamantly wanting it removed and it was removed in the ED. He was given insulin D50 Lasix and Kayexalate for his hyperkalemia. His heart rate is remaining between 110 and 120. It does appear to be in atrial flutter. I feel that we initially need to try fluid resuscitation and if it does not come down with that he may need to get some Cardizem however he hasn't had any tachycardia greater than 120s. He is denying any chest pain or shortness of breath. He initially had some mild hypotension however recheck on his blood pressure was 113/76. His urine did show signs of infection he was given Rocephin for possible UTI. He has no other suggestions  of sepsis. He is alert and mentating normally.  18:04: pt's SBP 93, will continue fluid bolus, HR 124.  Discussed with Dr. Mahala Menghini at [facility] as pt wants to go to [facility], but given his renal problem, it was felt that he wound best be served at [facility].  Discussed with pt, he is willing to go to [facility].  Dr. Mahala Menghini has discussed with renal service and will notify hospitalist team at [facility].  Care Plan Guidelines: Regardless of whether or  not a patient has a care plan, every ED patient is entitled to a Medical Screening Exam (MSE) per EMTALA to determine if an Emergency Medical Condition [facility]) exists.  Care plans are guidelines, and do not constitute a standard of care or substitute for clinical judgment.  Patient Name: [name]  Care Plan updated: Cedra Villalon , @TODAY @ , 6:06 PM  PCP:  [name], MD  PMH:   Past Medical History  Diagnosis Date  . Coronary artery disease   . MI, old     AGE 38  . Ulcerative colitis   . Hyperlipidemia   . Hypertension   . History of nephrolithiasis   . PAF (paroxysmal atrial fibrillation)   . LV dysfunction   . Myocardial infarction   . Anemia   . Blood transfusion   . Arthritis   . CHF (congestive heart failure)   . Mitral regurgitation   . Pneumonia     years ago  . Peripheral vascular disease   . 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 Acute renal failure     "related ? Kidney stone blockage"  . Gout flare     right index finger-tx. prednisone, allopurinol  . Swelling 05-08-13    rt. base of neck-something recent.  . Cancer 05/29/13    metastatic adenocarcinoma of right neck mass  . Metastatic adenocarcinoma 05/29/13    right supraclavicular area    CRITICAL CARE Performed by: Khaliya Golinski Total critical care time: 60 Critical care time was exclusive of separately billable procedures and treating other patients. Critical care was necessary to treat or prevent imminent or life-threatening deterioration. Critical care was time  spent personally by me on the following activities: development of treatment plan with patient and/or surrogate as well as nursing, discussions with consultants, evaluation of patient's response to treatment, examination of patient, obtaining history from patient or surrogate, ordering and performing treatments and interventions, ordering and review of laboratory studies, ordering and review of radiographic studies, pulse oximetry and re-evaluation of patient's condition.      Rolan Bucco, MD 10/22/13 1806

## 2013-10-23 ENCOUNTER — Ambulatory Visit: Payer: Medicare Other | Admitting: Family Medicine

## 2013-10-23 ENCOUNTER — Encounter (HOSPITAL_COMMUNITY): Payer: Self-pay | Admitting: Internal Medicine

## 2013-10-23 ENCOUNTER — Ambulatory Visit: Payer: Medicare Other | Admitting: Hematology & Oncology

## 2013-10-23 ENCOUNTER — Other Ambulatory Visit: Payer: Medicare Other | Admitting: Lab

## 2013-10-23 DIAGNOSIS — I4892 Unspecified atrial flutter: Secondary | ICD-10-CM

## 2013-10-23 DIAGNOSIS — N179 Acute kidney failure, unspecified: Secondary | ICD-10-CM | POA: Diagnosis present

## 2013-10-23 DIAGNOSIS — R198 Other specified symptoms and signs involving the digestive system and abdomen: Secondary | ICD-10-CM

## 2013-10-23 DIAGNOSIS — E860 Dehydration: Secondary | ICD-10-CM | POA: Diagnosis present

## 2013-10-23 DIAGNOSIS — E875 Hyperkalemia: Secondary | ICD-10-CM

## 2013-10-23 DIAGNOSIS — N390 Urinary tract infection, site not specified: Secondary | ICD-10-CM | POA: Diagnosis present

## 2013-10-23 DIAGNOSIS — E43 Unspecified severe protein-calorie malnutrition: Secondary | ICD-10-CM | POA: Insufficient documentation

## 2013-10-23 LAB — LACTATE DEHYDROGENASE: LDH: 229 U/L (ref 94–250)

## 2013-10-23 LAB — COMPREHENSIVE METABOLIC PANEL
ALT: 14 U/L (ref 0–53)
AST: 18 U/L (ref 0–37)
CO2: 21 mEq/L (ref 19–32)
Calcium: 8 mg/dL — ABNORMAL LOW (ref 8.4–10.5)
Chloride: 100 mEq/L (ref 96–112)
Creatinine, Ser: 5.47 mg/dL — ABNORMAL HIGH (ref 0.50–1.35)
GFR calc Af Amer: 10 mL/min — ABNORMAL LOW (ref >90)
GFR calc non Af Amer: 9 mL/min — ABNORMAL LOW (ref >90)
Glucose, Bld: 82 mg/dL (ref 70–99)
Sodium: 135 mEq/L (ref 135–145)
Total Bilirubin: 0.2 mg/dL — ABNORMAL LOW (ref 0.3–1.2)

## 2013-10-23 LAB — CBC
Hemoglobin: 9.1 g/dL — ABNORMAL LOW (ref 13.0–17.0)
MCH: 30.1 pg (ref 26.0–34.0)
MCHC: 34 g/dL (ref 30.0–36.0)
Platelets: 32 10*3/uL — ABNORMAL LOW (ref 150–400)

## 2013-10-23 LAB — URINE CULTURE
Colony Count: NO GROWTH
Culture: NO GROWTH

## 2013-10-23 LAB — PHOSPHORUS: Phosphorus: 5 mg/dL — ABNORMAL HIGH (ref 2.3–4.6)

## 2013-10-23 LAB — TROPONIN I
Troponin I: <0.3 ng/mL (ref <0.30)
Troponin I: <0.3 ng/mL (ref <0.30)
Troponin I: <0.3 ng/mL (ref <0.30)

## 2013-10-23 LAB — MRSA PCR SCREENING: MRSA by PCR: NEGATIVE

## 2013-10-23 LAB — TSH: TSH: 1.228 u[IU]/mL (ref 0.350–4.500)

## 2013-10-23 LAB — PROTEIN / CREATININE RATIO, URINE: Total Protein, Urine: 98.8 mg/dL

## 2013-10-23 LAB — MAGNESIUM: Magnesium: 1.1 mg/dL — ABNORMAL LOW (ref 1.5–2.5)

## 2013-10-23 LAB — CREATININE, URINE, RANDOM: Creatinine, Urine: 83.09 mg/dL

## 2013-10-23 LAB — URIC ACID: Uric Acid, Serum: 7.4 mg/dL (ref 4.0–7.8)

## 2013-10-23 MED ORDER — AMIODARONE LOAD VIA INFUSION
150.0000 mg | Freq: Once | INTRAVENOUS | Status: AC
  Administered 2013-10-23: 150 mg via INTRAVENOUS
  Filled 2013-10-23: qty 83.34

## 2013-10-23 MED ORDER — FERROUS SULFATE 325 (65 FE) MG PO TABS
325.0000 mg | ORAL_TABLET | Freq: Every evening | ORAL | Status: DC
  Administered 2013-10-23 – 2013-10-25 (×3): 325 mg via ORAL
  Filled 2013-10-23 (×4): qty 1

## 2013-10-23 MED ORDER — ENSURE COMPLETE PO LIQD
237.0000 mL | Freq: Three times a day (TID) | ORAL | Status: DC
  Administered 2013-10-23 – 2013-10-26 (×6): 237 mL via ORAL

## 2013-10-23 MED ORDER — MAGNESIUM SULFATE 40 MG/ML IJ SOLN
4.0000 g | Freq: Once | INTRAMUSCULAR | Status: AC
  Administered 2013-10-23: 4 g via INTRAVENOUS
  Filled 2013-10-23: qty 100

## 2013-10-23 MED ORDER — ONDANSETRON HCL 4 MG/2ML IJ SOLN
4.0000 mg | Freq: Four times a day (QID) | INTRAMUSCULAR | Status: DC | PRN
  Filled 2013-10-23: qty 2

## 2013-10-23 MED ORDER — SODIUM CHLORIDE 0.9 % IJ SOLN: 3.0000 mL | INTRAMUSCULAR | Status: DC | PRN

## 2013-10-23 MED ORDER — HYDROCODONE-ACETAMINOPHEN 5-325 MG PO TABS: 1.0000 | ORAL_TABLET | ORAL | Status: DC | PRN

## 2013-10-23 MED ORDER — CARVEDILOL 3.125 MG PO TABS
3.1250 mg | ORAL_TABLET | Freq: Two times a day (BID) | ORAL | Status: DC
  Administered 2013-10-23: 3.125 mg via ORAL
  Filled 2013-10-23 (×6): qty 1

## 2013-10-23 MED ORDER — DOCUSATE SODIUM 100 MG PO CAPS
100.0000 mg | ORAL_CAPSULE | Freq: Two times a day (BID) | ORAL | Status: DC
  Filled 2013-10-23 (×2): qty 1

## 2013-10-23 MED ORDER — HEPARIN SOD (PORK) LOCK FLUSH 100 UNIT/ML IV SOLN
500.0000 [IU] | Freq: Every day | INTRAVENOUS | Status: DC | PRN
  Filled 2013-10-23: qty 5

## 2013-10-23 MED ORDER — ACETAMINOPHEN 325 MG PO TABS
650.0000 mg | ORAL_TABLET | Freq: Four times a day (QID) | ORAL | Status: DC | PRN
Start: 2013-10-23 — End: 2013-10-26

## 2013-10-23 MED ORDER — ONDANSETRON HCL 4 MG PO TABS: 4.0000 mg | ORAL_TABLET | Freq: Four times a day (QID) | ORAL | Status: DC | PRN

## 2013-10-23 MED ORDER — ACETAMINOPHEN 650 MG RE SUPP: 650.0000 mg | Freq: Four times a day (QID) | RECTAL | Status: DC | PRN

## 2013-10-23 MED ORDER — AMIODARONE HCL 100 MG PO TABS
100.0000 mg | ORAL_TABLET | Freq: Every day | ORAL | Status: DC
  Filled 2013-10-23: qty 1

## 2013-10-23 MED ORDER — ISOSORBIDE MONONITRATE ER 30 MG PO TB24
30.0000 mg | ORAL_TABLET | Freq: Every evening | ORAL | Status: DC
  Administered 2013-10-23 – 2013-10-25 (×2): 30 mg via ORAL
  Filled 2013-10-23 (×4): qty 1

## 2013-10-23 MED ORDER — AMIODARONE HCL IN DEXTROSE 360-4.14 MG/200ML-% IV SOLN
60.0000 mg/h | INTRAVENOUS | Status: AC
  Administered 2013-10-23: 60 mg/h via INTRAVENOUS
  Filled 2013-10-23 (×2): qty 200

## 2013-10-23 MED ORDER — DEXTROSE 5 % IV SOLN
1.0000 g | INTRAVENOUS | Status: DC
  Administered 2013-10-23 – 2013-10-26 (×4): 1 g via INTRAVENOUS
  Filled 2013-10-23 (×4): qty 10

## 2013-10-23 MED ORDER — SODIUM CHLORIDE 0.9 % IV SOLN
INTRAVENOUS | Status: DC
  Administered 2013-10-23: 23:00:00 via INTRAVENOUS
  Administered 2013-10-23: 150 mL/h via INTRAVENOUS
  Administered 2013-10-24 – 2013-10-25 (×5): via INTRAVENOUS

## 2013-10-23 MED ORDER — TAMSULOSIN HCL 0.4 MG PO CAPS
0.4000 mg | ORAL_CAPSULE | Freq: Every day | ORAL | Status: DC
  Administered 2013-10-23 – 2013-10-25 (×3): 0.4 mg via ORAL
  Filled 2013-10-23 (×4): qty 1

## 2013-10-23 MED ORDER — SODIUM CHLORIDE 0.9 % IV SOLN
INTRAVENOUS | Status: DC
  Administered 2013-10-23: 06:00:00 via INTRAVENOUS

## 2013-10-23 MED ORDER — ATORVASTATIN CALCIUM 20 MG PO TABS
20.0000 mg | ORAL_TABLET | Freq: Every evening | ORAL | Status: DC
  Administered 2013-10-23 – 2013-10-25 (×3): 20 mg via ORAL
  Filled 2013-10-23 (×4): qty 1

## 2013-10-23 MED ORDER — AMIODARONE HCL IN DEXTROSE 360-4.14 MG/200ML-% IV SOLN
30.0000 mg/h | INTRAVENOUS | Status: DC
  Administered 2013-10-23 (×2): 30 mg/h via INTRAVENOUS
  Filled 2013-10-23 (×4): qty 200

## 2013-10-23 MED ORDER — SODIUM CHLORIDE 0.9 % IJ SOLN
3.0000 mL | Freq: Two times a day (BID) | INTRAMUSCULAR | Status: DC
  Administered 2013-10-24 – 2013-10-25 (×2): 3 mL via INTRAVENOUS

## 2013-10-23 MED ORDER — FENTANYL CITRATE 0.05 MG/ML IJ SOLN: 50.0000 ug | INTRAMUSCULAR | Status: DC | PRN

## 2013-10-23 NOTE — Progress Notes (Signed)
DIAGNOSIS:  Metastatic adenocarcinoma of the lung.  CURRENT THERAPY:  Status post 2 cycles of reduced-dose carboplatin and Alimta.  INTERIM HISTORY:  Mr. Mcmillon comes in for an early visit.  He was just discharged from [facility] on the 22nd.  Despite reduced-dose chemotherapy, he came in with leukopenia and thrombocytopenia.  He was transfused.  He felt better.  There was no obvious infection.  He has atrial fibrillation.  He was on Coumadin, but this was subsequently discontinued.  He has not been feeling well for the past few days.  He is not eating. He is not urinating much.  He is getting weaker.  He has no taste for food.  He has had no fever.  There have been no chills or sweats.  We did a chest x-ray on him.  The results are pending.  PHYSICAL EXAMINATION:  General:  This is a frail, elderly white gentleman in no obvious distress.  He is alert and oriented.  Vital signs:  Temperature 97.3, pulse 61, respiratory rate 18, blood pressure 101/71, iron saturation on room air is 93%.  Head and neck:  Dry oral mucosa.  There is no mucositis.  There is no thrush.  He has continued improvement in the right cervical lymph node.  It is mobile and nontender, probably measures about 2 cm.  No other adenopathy is noted. Lungs:  Clear bilaterally.  I hear no rales, wheezes, or rhonchi. Cardiac:  Irregular rate and rhythm consistent with atrial fibrillation. He has a 1/6 systolic ejection murmur. I do not hear any rubs.  Abdomen:  Soft.  He has decreased bowel sounds. There is no fluid wave.  There is no palpable hepatosplenomegaly. Extremities:  Some muscle atrophy in the upper and lower extremities. He has decreased strength in the upper and lower extremities.  Skin: Some ecchymoses on his lower legs.  Neurological:  No focal neurological deficits.  LABORATORY STUDIES:  White cell count is 10.9, hemoglobin 11, hematocrit 33.2, platelet count 50,000.  Sodium 140, potassium  6.3, BUN 66, creatinine 5.9.  Glucose 127.  On his peripheral smear there is some mild anisocytosis and poikilocytosis.  I really did not see any schistocytes.  There were no nucleated red blood cells.  There were no hypersegmented polys.  I saw no rouleaux formation.  Platelets were decreased in number.  IMPRESSION:  Mr. Meidinger is a nice 79 year old gentleman.  He now has renal failure.  I hope that this is from just dehydration.  When he was discharged, his renal function showed a BUN of 19, creatinine 1.8.  He has chronic renal insufficiency.  Again, when he was discharged his platelet count was 9, his white cell count was 0.8.  These certainly have improved.  Unfortunately, he is going to have to be readmitted.  He is going to need to have his potassium improved.  He will need to be on a monitor.  I would not think that he will need any kind of dialysis.  I think he probably will need a Foley catheter.  May need renal ultrasound to see what is going on there.  The chemotherapy that he got is not nephrotoxic.  I do not see any medicine that he is on that are new that would be causing issues for him.  Again, possible that maybe with him being dehydrated with atrial fibrillation this may result in some decreased blood flow to the kidneys.  Again, he will be hospitalized.  We will go ahead and  follow him along.  This is certainly a complicated situation for [place].  Again, I feel bad for Mr. Bohlken that he has to be back in the hospital.  We spent about 45 minutes with him today trying to sort out the problems.  He did have a chest x-ray done.  Of course, I do not have the results back yet.    ______________________________ Josph Macho, M.D. PRE/MEDQ  D:  10/22/2013  T:  10/23/2013  Job:  [id number]

## 2013-10-23 NOTE — H&P (Signed)
PCP:  [name], MD  [facility]  Chief Complaint:  Abnormal labs  HPI: [name] [name] is a 79 y.o. male   has a past medical history of Coronary artery disease; MI, old; Ulcerative colitis; Hyperlipidemia; Hypertension; History of nephrolithiasis; PAF (paroxysmal atrial fibrillation); LV dysfunction; Myocardial infarction; Anemia; Blood transfusion; Arthritis; CHF (congestive heart failure); Mitral regurgitation; Pneumonia; Peripheral vascular disease; Ileostomy in place (05-08-13); Acute renal failure; Gout flare; Swelling (05-08-13); Cancer (05/29/13); and Metastatic adenocarcinoma (05/29/13).   Presented with  Patient was recently discharged to home after admission for uncontrolled epistaxis due to thrombocytopenia. His hospitalization was complicated by persistent neutropenia and thrombocytopenia requiring transfusion of platelets. Patient is on chronic Coumadin for atrial fibrillation which has been held.  On 10/22/2013 he went to see his oncologist  and was found to be in acute on chronic renal failure with elevated K 6.3 and Cr 5.9 up from baseline of 1.8 patient was sent to [facility] and from there transferred to [facility] stepdown. An attempt has has been made to place a Foley which returned small amount of urine was sent for UA and found to have large amount of white blood cells a few bacteria as well as hematuria.  He has been seen so far both by nephrology and cardiology. Denies any chest pain. No shortness of breath. Feeling comfortable. He has had very little appetite and decreased PO intake since he started  his chemotherapy. Last treatment was 10/8. Significant history metastatic adenocarcinoma of the lung status post radiation therapy and one cycle of carboplatin and alimta.  Patient felt weaker  In the morning and lightheaded when he was trying to stand up. He denies any diarrhea but has had trouble eating due to nausea. Patient has hx of Colectomy due to UC sp colostomy no increased  production of stool.    Review of Systems:    Pertinent positives include: fatigue, weight loss light headed.dizziness,   Constitutional:  No weight loss, night sweats, Fevers, chills, HEENT:  No headaches, Difficulty swallowing,Tooth/dental problems,Sore throat,  No sneezing, itching, ear ache, nasal congestion, post nasal drip,  Cardio-vascular:  No chest pain, Orthopnea, PND, anasarca, palpitations.no Bilateral lower extremity swelling  GI:  No heartburn, indigestion, abdominal pain, nausea, vomiting, diarrhea, change in bowel habits, loss of appetite, melena, blood in stool, hematemesis Resp:  no shortness of breath at rest. No dyspnea on exertion, No excess mucus, no productive cough, No non-productive cough, No coughing up of blood.No change in color of mucus.No wheezing. Skin:  no rash or lesions. No jaundice GU:  no dysuria, change in color of urine, no urgency or frequency. No straining to urinate.  No flank pain.  Musculoskeletal:  No joint pain or no joint swelling. No decreased range of motion. No back pain.  Psych:  No change in mood or affect. No depression or anxiety. No memory loss.  Neuro: no localizing neurological complaints, no tingling, no weakness, no double vision, no gait abnormality, no slurred speech, no confusion  Otherwise ROS are negative except for above, 10 systems were reviewed  Past Medical History: Past Medical History  Diagnosis Date  . Coronary artery disease   . MI, old     AGE 37  . Ulcerative colitis   . Hyperlipidemia   . Hypertension   . History of nephrolithiasis   . PAF (paroxysmal atrial fibrillation)   . LV dysfunction   . Myocardial infarction   . Anemia   . Blood transfusion   . Arthritis   .  CHF (congestive heart failure)   . Mitral regurgitation   . Pneumonia     years ago  . Peripheral vascular disease   . 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 Acute renal failure     "related ? Kidney stone blockage"   . Gout flare     right index finger-tx. prednisone, allopurinol  . Swelling 05-08-13    rt. base of neck-something recent.  . Cancer 05/29/13    metastatic adenocarcinoma of right neck mass  . Metastatic adenocarcinoma 05/29/13    right supraclavicular area   Past Surgical History  Procedure Laterality Date  . Cardiac catheterization  10/14/2010    EF 30%  . Coronary artery bypass graft  10/24/2010    LIMA TO THE LAD, A SAPHENOUS VEIN GRAFT TO THE DIAGONAL 1 AND 3, AND SAPHENOUS VEIN GRAFT TO THE DISTAL RIGHT CORONARY ARTERY  . Ileostomy  04/23/73  . Colectomy    . Cataract extraction    . Transthoracic echocardiogram  01/16/2011    EF 50%  . Cardiovascular stress test  10/07/2010    EF 33%  . Appendectomy    . Cystostomy  04/22/2012    Procedure: CYSTOSTOMY SUPRAPUBIC;  Surgeon: Garnett Farm, MD;  Location: [facility];  Service: Urology;  Laterality: N/A;  CYSTOTOMY WITH MIDLINE INCISION   . Tee without cardioversion  08/28/2012    Procedure: TRANSESOPHAGEAL ECHOCARDIOGRAM (TEE);  Surgeon: [name], MD;  Location: [facility] ENDOSCOPY;  Service: Cardiovascular;  Laterality: N/A;  . Cardioversion  08/28/2012    Procedure: CARDIOVERSION;  Surgeon: [name], MD;  Location: [facility] ENDOSCOPY;  Service: Cardiovascular;  Laterality: N/A;  . Cystoscopy w/ ureteral stent placement Right 05/12/2013    Procedure: CYSTOSCOPY WITH RIGHT RETROGRADE PYELOGRAM/ RIGHT URETERAL STENT PLACEMENT;  Surgeon: Garnett Farm, MD;  Location: [facility];  Service: Urology;  Laterality: Right;     Medications: Prior to Admission medications   Medication Sig Start Date End Date Taking? Authorizing Provider  allopurinol (ZYLOPRIM) 100 MG tablet Take 100 mg by mouth at bedtime.    Yes Historical Provider, MD  amiodarone (PACERONE) 200 MG tablet Take 0.5 tablets (100 mg total) by mouth daily. 09/01/13  Yes [name], MD  atorvastatin (LIPITOR) 40 MG tablet Take 20 mg by mouth every evening. 1/2 tablet daily 07/21/13   Yes [name], MD  carvedilol (COREG) 6.25 MG tablet Take 3.125 mg by mouth 2 (two) times daily with a meal.    Yes Historical Provider, MD  cholecalciferol (VITAMIN D) 1000 UNITS tablet Take 1,000 Units by mouth every morning.    Yes Historical Provider, MD  ferrous sulfate 325 (65 FE) MG tablet Take 325 mg by mouth every evening.   Yes Historical Provider, MD  fish oil-omega-3 fatty acids 1000 MG capsule Take 1 g by mouth 2 (two) times daily.    Yes Historical Provider, MD  FLAXSEED, LINSEED, PO Take 1 tablet by mouth at bedtime.    Yes Historical Provider, MD  folic acid (FOLVITE) 400 MCG tablet Take 400 mcg by mouth every morning.    Yes Historical Provider, MD  isosorbide mononitrate (IMDUR) 60 MG 24 hr tablet Take 30 mg by mouth every evening.    Yes Historical Provider, MD  nitroGLYCERIN (NITROSTAT) 0.4 MG SL tablet Place 1 tablet (0.4 mg total) under the tongue every 5 (five) minutes as needed for chest pain. 06/03/13  Yes Agapito Games, MD  potassium  chloride SA (K-DUR,KLOR-CON) 20 MEQ tablet Take 10 mEq by mouth 2 (two) times daily.    Yes Historical Provider, MD  Tamsulosin HCl (FLOMAX) 0.4 MG CAPS Take 0.4 mg by mouth daily after supper.    Yes Historical Provider, MD    Allergies:  No Known Allergies  Social History:  Ambulatory independently or  cane Lives at  Home with family   reports that he quit smoking about 44 years ago. His smoking use included Cigarettes and Cigars. He has a 15 pack-year smoking history. He does not have any smokeless tobacco history on file. He reports that he does not drink alcohol or use illicit drugs.   Family History: family history includes Colon cancer in his mother; Heart attack in his father; Heart disease in his brother, brother, brother, brother, brother, brother, brother, brother, sister, sister, and sister; Hypertension in his brother, brother, brother, brother, brother, brother, brother, brother, father, sister, sister, and  sister; Stroke in his brother.    Physical Exam: Patient Vitals for the past 24 hrs:  BP Temp Temp src Pulse Resp SpO2 Height Weight  10/23/13 0000 102/73 mmHg 97.6 F (36.4 C) Oral 121 19 97 % - -  10/22/13 1945 107/56 mmHg 97.8 F (36.6 C) Oral 116 13 99 % - -  10/22/13 1831 101/72 mmHg 97.7 F (36.5 C) Oral 119 12 100 % - -  10/22/13 1706 113/76 mmHg - - 119 24 100 % - -  10/22/13 1504 97/61 mmHg 98.4 F (36.9 C) Oral 74 18 100 % 5\' 4"  (1.626 m) 55.566 kg (122 lb 8 oz)    1. General:  in No Acute distress 2. Psychological: Alert and   Oriented 3. Head/ENT:    Dry Mucous Membranes                          Head Non traumatic, neck supple                            Poor Dentition 4. SKIN:   decreased Skin turgor,  Skin clean Dry and intact no rash 5. Heart: irregular rate and rhythm no Murmur, Rub or gallop 6. Lungs: Clear to auscultation bilaterally, no wheezes or crackles   7. Abdomen: Soft, non-tender, Non distended, colostomy bag in place 8. Lower extremities: no clubbing, cyanosis, or edema 9. Neurologically Grossly intact, moving all 4 extremities equally 10. MSK: Normal range of motion  body mass index is 21.02 kg/(m^2).   Labs on Admission:   Recent Labs  10/22/13 1530 10/22/13 2210  NA 130* 139  K 6.0* 4.8  CL 92* 100  CO2 21 21  GLUCOSE 131* 97  BUN 66* 64*  CREATININE 5.90* 5.60*  CALCIUM 9.4 8.5  PHOS  --  5.0*    Recent Labs  10/22/13 1530  AST 23  ALT 20  ALKPHOS 98  BILITOT 0.4  PROT 6.6  ALBUMIN 2.8*   No results found for this basename: LIPASE, AMYLASE,  in the last 72 hours  Recent Labs  10/22/13 1349 10/22/13 1530  WBC 10.9* 10.7*  NEUTROABS 9.5* 9.5*  HGB 11.0* 10.4*  HCT 33.2* 30.6*  MCV 91 89.2  PLT 50* 45*   No results found for this basename: CKTOTAL, CKMB, CKMBINDEX, TROPONINI,  in the last 72 hours No results found for this basename: TSH, T4TOTAL, FREET3, T3FREE, THYROIDAB,  in the last 72 hours No results  found for  this basename: VITAMINB12, FOLATE, FERRITIN, TIBC, IRON, RETICCTPCT,  in the last 72 hours Lab Results  Component Value Date   HGBA1C  Value: 4.8 (NOTE)                                                                       According to the ADA Clinical Practice Recommendations for 2011, when HbA1c is used as a screening test:   >=6.5%   Diagnostic of Diabetes Mellitus           (if abnormal result  is confirmed)  5.7-6.4%   Increased risk of developing Diabetes Mellitus  References:Diagnosis and Classification of Diabetes Mellitus,Diabetes Care,2011,34(Suppl 1):S62-S69 and Standards of Medical Care in         Diabetes - 2011,Diabetes Care,2011,34  (Suppl 1):S11-S61. 10/19/2010    Estimated Creatinine Clearance: 8.3 ml/min (by C-G formula based on Cr of 5.6). ABG    Component Value Date/Time   PHART 7.428 10/24/2010 2359   HCO3 20.1 10/24/2010 2359   TCO2 5 10/25/2010 1714   ACIDBASEDEF 4.0* 10/24/2010 2359   O2SAT 99.0 10/24/2010 2359     Lab Results  Component Value Date   DDIMER 2.82* 10/14/2013     Other results:  I have pearsonaly reviewed this: ECG REPORT  Rate: 111  Rhythm: rbbb, possible atrial flutter ST&T Change: no ischemia  UA  WBC too numerous to count   Cultures:    Component Value Date/Time   SDES URINE, RANDOM 04/30/2013 1516   SPECREQUEST NONE 04/30/2013 1516   CULT NO GROWTH 04/30/2013 1516   REPTSTATUS 05/01/2013 FINAL 04/30/2013 1516       Radiological Exams on Admission: US Renal  10/23/2013   CLINICAL DATA:  Acute renal failure  EXAM: RENAL/URINARY TRACT ULTRASOUND COMPLETE  COMPARISON:  Abdominal CT 04/30/2013  FINDINGS: Right Kidney  Length: 10 cm in length, although there is diffuse cortical thinning (approximately 9 mm in the interpolar region). No hydronephrosis. There are innumerable cysts, the largest measured is in the upper pole at 4 cm, although comparison CT shows a large lesion from the lower pole measuring 6 cm. Increased renal echogenicity  consistent with medical renal disease. Previously seen right-sided stones not evident currently.  Left Kidney  Length: 11 cm in length but diffuse cortical thinning. No hydronephrosis. There are numerous hilar echogenic foci, best seen in the interpolar and lower pole regions, consistent with stones. Multiple cysts, the largest in the lower pole at 5 cm.  Bladder  Appears normal for distension.  94 cc volume.  IMPRESSION: 1. No hydronephrosis. 2. Polycystic kidneys and medical renal disease/atrophy. 3. Left nephrolithiasis.   Electronically Signed   By: Tiburcio Pea M.D.   On: 10/23/2013 00:57   Dg Chest Port 1 View  10/22/2013   CLINICAL DATA:  Renal failure.  Lung cancer.  EXAM: PORTABLE CHEST - 1 VIEW  COMPARISON:  Chest x-ray 10/12/2012  FINDINGS: Left Port-A-Cath is in place with the tip in the SVC near the cavoatrial junction. Prior median sternotomy. Heart is normal size. No focal airspace opacities or visible effusion. No acute bony abnormality.  IMPRESSION: No active disease.   Electronically Signed   By: Charlett Nose M.D.   On: 10/22/2013 16:28  Chart has been reviewed  Assessment/Plan  79 year old gentleman with history of lung cancer extensive acute on chronic renal failure with renal ultrasound significant for polycystic kidneys no evidence of obstruction.  Present on Admission:  . Acute renal failure - acute on chronic renal failure we'll give IV fluids renal is aware and is following renal ultrasound did not show any evidence of obstruction . UTI (urinary tract infection) -  Treat with Rocephin await results of urine culture . Paroxysmal atrial fibrillation - continue home medications IV fluids are probably help with tachycardia  . Metastatic adenocarcinoma - would make sure that oncology is aware of patient being here  . Essential hypertension, benign - continue home medications  . Chronic systolic CHF (congestive heart failure) - at this point appears to be dehydrated we'll  hold Lasix give IV fluids   . Anemia of chronic disease - at this point no indication for transfusion     Prophylaxis: SCD  Protonix  CODE STATUS: DNI/DNR as per patient's wishes  Other plan as per orders.  I have spent a total of 55 min on this admission  Kyrillos Adams 10/23/2013, 3:50 AM

## 2013-10-23 NOTE — Progress Notes (Addendum)
INITIAL NUTRITION ASSESSMENT  DOCUMENTATION CODES Per approved criteria  -Severe malnutrition in the context of chronic illness   INTERVENTION:  Ensure Complete PO TID, each supplement provides 350 kcal and 13 grams of protein.  Recommend liberalize diet to regular.  NUTRITION DIAGNOSIS: Malnutrition related to inadequate oral intake as evidenced by severe depletion of muscle mass and 14% weight loss in the past month.   Goal: Intake to meet >90% of estimated nutrition needs.  Monitor:  PO intake, labs, weight trend.  Reason for Assessment: MST=2  79 y.o. male  Admitting Dx: abnormal labs  ASSESSMENT: Patient was recently discharged to home after admission for uncontrolled epistaxis due to thrombocytopenia. His hospitalization was complicated by persistent neutropenia and thrombocytopenia requiring transfusion of platelets. Patient was admitted on 10/29 after visit to oncologist showed labs consistent with acute on chronic renal failure with elevated K and Cr. Significant history of metastatic adenocarcinoma of the lung status post radiation therapy and one cycle of carboplatin and alimta. History of colectomy due to ulcerative colitis; S/P ileostomy 35 years ago; ileostomy still in place.  Patient reports that he has lost weight since he started chemotherapy. He drinks Ensure at home, likes vanilla flavor. He has been eating very poorly since he started chemotherapy earlier this month.   Nutrition Focused Physical Exam:  Subcutaneous Fat:  Orbital Region: mild-moderate PCM Upper Arm Region: mild-moderate PCM Thoracic and Lumbar Region: NA  Muscle:  Temple Region: severe depletion Clavicle Bone Region: severe depletion Clavicle and Acromion Bone Region: severe depletion Scapular Bone Region: NA Dorsal Hand: mild-moderate depletion Patellar Region: severe depletion Anterior Thigh Region: severe depletion Posterior Calf Region: severe depletion  Edema: none  Pt meets  criteria for severe MALNUTRITION in the context of chronic illness as evidenced by severe depletion of muscle mass and 14% weight loss in the past month.  Height: Ht Readings from Last 1 Encounters:  10/22/13 5\' 4"  (1.626 m)    Weight: Wt Readings from Last 1 Encounters:  10/22/13 122 lb 8 oz (55.566 kg)    Ideal Body Weight: 59.1 kg  % Ideal Body Weight: 94%  Wt Readings from Last 10 Encounters:  10/22/13 122 lb 8 oz (55.566 kg)  10/11/13 142 lb 4.8 oz (64.547 kg)  10/01/13 141 lb (63.957 kg)  09/29/13 142 lb (64.411 kg)  09/26/13 140 lb (63.504 kg)  09/23/13 148 lb (67.132 kg)  09/11/13 151 lb (68.493 kg)  08/29/13 152 lb (68.947 kg)  08/22/13 156 lb (70.761 kg)  08/05/13 157 lb 1.6 oz (71.26 kg)    Usual Body Weight: 142 lb  % Usual Body Weight: 86%  BMI:  Body mass index is 21.02 kg/(m^2).  Estimated Nutritional Needs: Kcal: 1800-2000 Protein: 90-110 gm Fluid: 1.8-2 L  Skin: no wounds  Diet Order: Cardiac  EDUCATION NEEDS: -Education needs addressed   Intake/Output Summary (Last 24 hours) at 10/23/13 0940 Last data filed at 10/23/13 0600  Gross per 24 hour  Intake   1500 ml  Output   1560 ml  Net    -60 ml    Last BM: PTA   Labs:   Recent Labs Lab 10/22/13 1530 10/22/13 2210 10/23/13 0540  NA 130* 139 135  K 6.0* 4.8 4.7  CL 92* 100 100  CO2 21 21 21   BUN 66* 64* 61*  CREATININE 5.90* 5.60* 5.47*  CALCIUM 9.4 8.5 8.0*  MG  --   --  1.1*  PHOS  --  5.0* 5.4*  GLUCOSE 131*  97 82    CBG (last 3)  No results found for this basename: GLUCAP,  in the last 72 hours  Scheduled Meds: . atorvastatin  20 mg Oral QPM  . carvedilol  3.125 mg Oral BID WC  . cefTRIAXone (ROCEPHIN)  IV  1 g Intravenous Q24H  . docusate sodium  100 mg Oral BID  . ferrous sulfate  325 mg Oral QPM  . isosorbide mononitrate  30 mg Oral QPM  . magnesium sulfate 1 - 4 g bolus IVPB  4 g Intravenous Once  . sodium chloride  3 mL Intravenous Q12H  . tamsulosin  0.4  mg Oral QPC supper    Continuous Infusions: . sodium chloride 100 mL/hr at 10/23/13 0600  . amiodarone (NEXTERONE PREMIX) 360 mg/200 mL dextrose 60 mg/hr (10/23/13 0850)   Followed by  . amiodarone (NEXTERONE PREMIX) 360 mg/200 mL dextrose      Past Medical History  Diagnosis Date  . Coronary artery disease   . MI, old     AGE 15  . Ulcerative colitis   . Hyperlipidemia   . Hypertension   . History of nephrolithiasis   . PAF (paroxysmal atrial fibrillation)   . LV dysfunction   . Myocardial infarction   . Anemia   . Blood transfusion   . Arthritis   . CHF (congestive heart failure)   . Mitral regurgitation   . Pneumonia     years ago  . Peripheral vascular disease   . 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 Acute renal failure     "related ? Kidney stone blockage"  . Gout flare     right index finger-tx. prednisone, allopurinol  . Swelling 05-08-13    rt. base of neck-something recent.  . Cancer 05/29/13    metastatic adenocarcinoma of right neck mass  . Metastatic adenocarcinoma 05/29/13    right supraclavicular area    Past Surgical History  Procedure Laterality Date  . Cardiac catheterization  10/14/2010    EF 30%  . Coronary artery bypass graft  10/24/2010    LIMA TO THE LAD, A SAPHENOUS VEIN GRAFT TO THE DIAGONAL 1 AND 3, AND SAPHENOUS VEIN GRAFT TO THE DISTAL RIGHT CORONARY ARTERY  . Ileostomy  04/23/73  . Colectomy    . Cataract extraction    . Transthoracic echocardiogram  01/16/2011    EF 50%  . Cardiovascular stress test  10/07/2010    EF 33%  . Appendectomy    . Cystostomy  04/22/2012    Procedure: CYSTOSTOMY SUPRAPUBIC;  Surgeon: Garnett Farm, MD;  Location: [facility];  Service: Urology;  Laterality: N/A;  CYSTOTOMY WITH MIDLINE INCISION   . Tee without cardioversion  08/28/2012    Procedure: TRANSESOPHAGEAL ECHOCARDIOGRAM (TEE);  Surgeon: [name], MD;  Location: [facility] ENDOSCOPY;  Service: Cardiovascular;  Laterality: N/A;  . Cardioversion   08/28/2012    Procedure: CARDIOVERSION;  Surgeon: [name], MD;  Location: [facility] ENDOSCOPY;  Service: Cardiovascular;  Laterality: N/A;  . Cystoscopy w/ ureteral stent placement Right 05/12/2013    Procedure: CYSTOSCOPY WITH RIGHT RETROGRADE PYELOGRAM/ RIGHT URETERAL STENT PLACEMENT;  Surgeon: Garnett Farm, MD;  Location: [facility];  Service: Urology;  Laterality: Right;    Joaquin Courts, RD, LDN, CNSC Pager [phone] After Hours Pager [phone]

## 2013-10-23 NOTE — Progress Notes (Signed)
Patient ID: [name] [name], male   DOB: 06/16/1930, 79 y.o.   MRN: [id number]    SUBJECTIVE: No dyspnea or chest pain.  HR in the 110s, atrial flutter.   [name] amiodarone  150 mg Intravenous Once  . atorvastatin  20 mg Oral QPM  . carvedilol  3.125 mg Oral BID WC  . cefTRIAXone (ROCEPHIN)  IV  1 g Intravenous Q24H  . docusate sodium  100 mg Oral BID  . ferrous sulfate  325 mg Oral QPM  . isosorbide mononitrate  30 mg Oral QPM  . sodium chloride  3 mL Intravenous Q12H  . tamsulosin  0.4 mg Oral QPC supper      Filed Vitals:   10/22/13 1831 10/22/13 1945 10/23/13 0000 10/23/13 0400  BP: 101/72 107/56 102/73 107/76  Pulse: 119 116 121 111  Temp: 97.7 F (36.5 C) 97.8 F (36.6 C) 97.6 F (36.4 C) 97.6 F (36.4 C)  TempSrc: Oral Oral Oral Oral  Resp: 12 13 19 20   Height:      Weight:      SpO2: 100% 99% 97% 100%    Intake/Output Summary (Last 24 hours) at 10/23/13 0807 Last data filed at 10/23/13 0600  Gross per 24 hour  Intake   1500 ml  Output   1560 ml  Net    -60 ml    LABS: Basic Metabolic Panel:  Recent Labs  40/98/11 2210 10/23/13 0540  NA 139 135  K 4.8 4.7  CL 100 100  CO2 21 21  GLUCOSE 97 82  BUN 64* 61*  CREATININE 5.60* 5.47*  CALCIUM 8.5 8.0*  MG  --  1.1*  PHOS 5.0* 5.4*   Liver Function Tests:  Recent Labs  10/22/13 1530 10/23/13 0540  AST 23 18  ALT 20 14  ALKPHOS 98 79  BILITOT 0.4 0.2*  PROT 6.6 5.6*  ALBUMIN 2.8* 2.3*   No results found for this basename: LIPASE, AMYLASE,  in the last 72 hours CBC:  Recent Labs  10/22/13 1349 10/22/13 1530 10/23/13 0540  WBC 10.9* 10.7* 7.1  NEUTROABS 9.5* 9.5*  --   HGB 11.0* 10.4* 9.1*  HCT 33.2* 30.6* 26.8*  MCV 91 89.2 88.7  PLT 50* 45* 32*   Cardiac Enzymes:  Recent Labs  10/23/13 0540  TROPONINI <0.30   BNP: No components found with this basename: POCBNP,  D-Dimer: No results found for this basename: DDIMER,  in the last 72 hours Hemoglobin A1C: No results found for  this basename: HGBA1C,  in the last 72 hours Fasting Lipid Panel: No results found for this basename: CHOL, HDL, LDLCALC, TRIG, CHOLHDL, LDLDIRECT,  in the last 72 hours Thyroid Function Tests: No results found for this basename: TSH, T4TOTAL, FREET3, T3FREE, THYROIDAB,  in the last 72 hours Anemia Panel: No results found for this basename: VITAMINB12, FOLATE, FERRITIN, TIBC, IRON, RETICCTPCT,  in the last 72 hours  RADIOLOGY: US Renal  10/23/2013   CLINICAL DATA:  Acute renal failure  EXAM: RENAL/URINARY TRACT ULTRASOUND COMPLETE  COMPARISON:  Abdominal CT 04/30/2013  FINDINGS: Right Kidney  Length: 10 cm in length, although there is diffuse cortical thinning (approximately 9 mm in the interpolar region). No hydronephrosis. There are innumerable cysts, the largest measured is in the upper pole at 4 cm, although comparison CT shows a large lesion from the lower pole measuring 6 cm. Increased renal echogenicity consistent with medical renal disease. Previously seen right-sided stones not evident currently.  Left Kidney  Length: 11  cm in length but diffuse cortical thinning. No hydronephrosis. There are numerous hilar echogenic foci, best seen in the interpolar and lower pole regions, consistent with stones. Multiple cysts, the largest in the lower pole at 5 cm.  Bladder  Appears normal for distension.  94 cc volume.  IMPRESSION: 1. No hydronephrosis. 2. Polycystic kidneys and medical renal disease/atrophy. 3. Left nephrolithiasis.   Electronically Signed   By: Tiburcio Pea M.D.   On: 10/23/2013 00:57   Dg Chest Port 1 View  10/22/2013   CLINICAL DATA:  Renal failure.  Lung cancer.  EXAM: PORTABLE CHEST - 1 VIEW  COMPARISON:  Chest x-ray 10/12/2012  FINDINGS: Left Port-A-Cath is in place with the tip in the SVC near the cavoatrial junction. Prior median sternotomy. Heart is normal size. No focal airspace opacities or visible effusion. No acute bony abnormality.  IMPRESSION: No active disease.    Electronically Signed   By: Charlett Nose M.D.   On: 10/22/2013 16:28    PHYSICAL EXAM General: NAD Neck: No JVD, no thyromegaly or thyroid nodule.  Lungs: Clear to auscultation bilaterally with normal respiratory effort. CV: Nondisplaced PMI.  Heart regular S1/S2, no S3/S4, 1/6 HSM apex.  No peripheral edema.  No carotid bruit.  Normal pedal pulses.  Abdomen: Soft, nontender, no hepatosplenomegaly, no distention.  Neurologic: Alert and oriented x 3.  Psych: Normal affect. Extremities: No clubbing or cyanosis.   TELEMETRY: Reviewed telemetry pt in atrial flutter, rate 110s  ASSESSMENT AND PLAN: 79 yo with history of CAD, paroxysmal atrial fibrillation, ischemic cardiomyopathy, and ischemic MR had developed lung cancer and is s/p 2 rounds of chemotherapy. He was admitted with dizziness and found to be in AKI on CKD.  In the hospital, he was noted to be in atrial flutter with RVR.  1. Atrial flutter: h/o paroxysmal atrial fibrillation.  He had been on low dose amiodarone.  He is off coumadin with thrombocytopenia and severe epistaxis.  He is in atrial flutter with RVR (110s) this morning.   - Will rate control for now with IV amiodarone.  - Will not be able to safely electrically cardiovert as he cannot be on anticoagulation at this time.  2. CAD: Stable with no ischemic symptoms.  Continue statin.  Off ASA with thrombocytopenia.  3. Ischemic cardiomyopathy: Last EF 45-50%.  He is likely dehydrated.  4. Ischemic mitral regurgitation: Surprisingly, only mild MR on last echo (had been severe).  He does not have a marked murmur on exam.  5. AKI on CKD: Possibly prerenal azotemia given poor po intake, nausea and vomiting associated with chemotherapy. - nephrology involved. - IV fluid per primary service.   [name] 10/23/2013 8:11 AM

## 2013-10-23 NOTE — Progress Notes (Signed)
Acute Kidney Injury due to dehydration and possble chemo CKD Metastatic [place]  Plan: Fluid rehydration  Subjective: Interval History: Urgency  Objective: Vital signs in last 24 hours: Temp:  [97.6 F (36.4 C)-98.4 F (36.9 C)] 97.7 F (36.5 C) (10/30 0800) Pulse Rate:  [64-121] 64 (10/30 0800) Resp:  [12-24] 20 (10/30 0800) BP: (92-113)/(56-76) 92/56 mmHg (10/30 0800) SpO2:  [97 %-100 %] 100 % (10/30 0800) Weight:  [55.566 kg (122 lb 8 oz)] 55.566 kg (122 lb 8 oz) (10/29 1504) Weight change:   Intake/Output from previous day: 10/29 0701 - 10/30 0700 In: 1500 [I.V.:1500] Out: 1560 [Urine:60; Stool:1500] Intake/Output this shift:    General appearance: alert and cooperative Resp: clear to auscultation bilaterally Cardio: regular rate and rhythm, S1, S2 normal, no murmur, click, rub or gallop Extremities: edema tr  Lab Results:  Recent Labs  10/22/13 1530 10/23/13 0540  WBC 10.7* 7.1  HGB 10.4* 9.1*  HCT 30.6* 26.8*  PLT 45* 32*   BMET:  Recent Labs  10/22/13 2210 10/23/13 0540  NA 139 135  K 4.8 4.7  CL 100 100  CO2 21 21  GLUCOSE 97 82  BUN 64* 61*  CREATININE 5.60* 5.47*  CALCIUM 8.5 8.0*   No results found for this basename: PTH,  in the last 72 hours Iron Studies: No results found for this basename: IRON, TIBC, TRANSFERRIN, FERRITIN,  in the last 72 hours Studies/Results: US Renal  10/23/2013   CLINICAL DATA:  Acute renal failure  EXAM: RENAL/URINARY TRACT ULTRASOUND COMPLETE  COMPARISON:  Abdominal CT 04/30/2013  FINDINGS: Right Kidney  Length: 10 cm in length, although there is diffuse cortical thinning (approximately 9 mm in the interpolar region). No hydronephrosis. There are innumerable cysts, the largest measured is in the upper pole at 4 cm, although comparison CT shows a large lesion from the lower pole measuring 6 cm. Increased renal echogenicity consistent with medical renal disease. Previously seen right-sided stones not evident  currently.  Left Kidney  Length: 11 cm in length but diffuse cortical thinning. No hydronephrosis. There are numerous hilar echogenic foci, best seen in the interpolar and lower pole regions, consistent with stones. Multiple cysts, the largest in the lower pole at 5 cm.  Bladder  Appears normal for distension.  94 cc volume.  IMPRESSION: 1. No hydronephrosis. 2. Polycystic kidneys and medical renal disease/atrophy. 3. Left nephrolithiasis.   Electronically Signed   By: Tiburcio Pea M.D.   On: 10/23/2013 00:57   Dg Chest Port 1 View  10/22/2013   CLINICAL DATA:  Renal failure.  Lung cancer.  EXAM: PORTABLE CHEST - 1 VIEW  COMPARISON:  Chest x-ray 10/12/2012  FINDINGS: Left Port-A-Cath is in place with the tip in the SVC near the cavoatrial junction. Prior median sternotomy. Heart is normal size. No focal airspace opacities or visible effusion. No acute bony abnormality.  IMPRESSION: No active disease.   Electronically Signed   By: Charlett Nose M.D.   On: 10/22/2013 16:28   Scheduled: . atorvastatin  20 mg Oral QPM  . carvedilol  3.125 mg Oral BID WC  . cefTRIAXone (ROCEPHIN)  IV  1 g Intravenous Q24H  . docusate sodium  100 mg Oral BID  . feeding supplement (ENSURE COMPLETE)  237 mL Oral TID BM  . ferrous sulfate  325 mg Oral QPM  . isosorbide mononitrate  30 mg Oral QPM  . magnesium sulfate 1 - 4 g bolus IVPB  4 g Intravenous Once  . sodium  chloride  3 mL Intravenous Q12H  . tamsulosin  0.4 mg Oral QPC supper     LOS: 1 day   [name] [name] C 10/23/2013,12:00 PM

## 2013-10-23 NOTE — Telephone Encounter (Signed)
Pt's wife called stating he starting having dizziness and decreased urine output on Tuesday. He is has worsened today and she is getting concerned because he is not drinking or eating very much and seems very weak. Reviewed with Dr Myna Hidalgo. To come in for stat labs and possible hydration.

## 2013-10-23 NOTE — Progress Notes (Signed)
[facility] Progress Note [name] [name] TEAM 1 - Stepdown ICU Team   Itai Barbian ZOX:[id number] DOB: 02-12-1930 DOA: 10/22/2013 PCP: [name], MD  Brief narrative: 79 year old male with multiple medical problems. Most recently has been treated for lung cancer and has been undergoing chemotherapy under the direction of Dr. Myna Hidalgo. He has a chronic high output ileostomy syndrome which he manages quite nicely. He was recently hospitalized due to uncontrolled epistaxis related to chemotherapeutic induced thrombocytopenia.  On 10/22/2013 he follow up with his oncologist and laboratory data revealed acute on chronic renal failure with hyperkalemia and creatinine 5.9 up from a baseline of 1.8. He was subsequently transferred to [facility] and directly admitted to the step down unit. An attempt was made to place a Foley catheter but was unsuccessful. A urinalysis was obtained and this appeared consistent with a urinary tract infection. According to the patient he has had generalized malaise for several days with poor appetite and decreased oral intake but had worsened with most recent chemotherapy treatment on 10/01/2013. Patient also endorsed increased weakness and lightheadedness with mobilization especially when standing upright.  Assessment/Plan: Active Problems:   Acute renal failure/CKD (chronic kidney disease) -cr slowly decreasing with hydration -cont IVFs and follow lytes -Dr. Myna Hidalgo says current chemo not nephrotoxic - pt states he drinks 64 oz of water daily which contradicts the fact that he appears to be dehydrated - urine sodium was high and sp gravity of urine was normal    UTI (urinary tract infection) -cx pending -cont empiric anbx's    Dehydration -NOT related to ileostomy output since has been chronically high for 30 years -more likely due to poor PO intake recently    High output ileostomy-CHRONIC -allow for unlimited intake of fluids including  Gatorade    Metastatic adenocarcinoma lung/Antineoplastic chemotherapy induced pancytopenia -Ennever following -labs stable    Chronic systolic CHF (congestive heart failure) -compensated and currently dehydrated -cont coreg    Essential hypertension, benign -BP soft so watch closely on Coreg and consider HOLD Imdur    Paroxysmal atrial fibrillation -rate initially not controlled and likely due to [facility]- better -cont IV Amiodarone    Anemia of chronic disease -hgb stable    DVT prophylaxis: SCDs Code Status: DO NOT RESUSCITATE Family Communication: Patient and wife at bedside Disposition Plan/Expected LOS: Remain in step down Isolation: None Nutritional Status: Acute protein calorie malnutrition related to anorexia from recurrent chemotherapy treatments  Consultants: Oncology Cardiology  Procedures: None  Antibiotics: Rocephin 10/29 >>>  HPI/Subjective: Patient alert and primarily endorsing increased frequency of micturition but no dysuria. No chest pain or shortness of breath. No apparent awareness of tachypalpitations.  Objective: Blood pressure 102/67, pulse 75, temperature 97.3 F (36.3 C), temperature source Oral, resp. rate 22, height 5\' 4"  (1.626 m), weight 55.566 kg (122 lb 8 oz), SpO2 100.00%.  Intake/Output Summary (Last 24 hours) at 10/23/13 1322 Last data filed at 10/23/13 1305  Gross per 24 hour  Intake   2740 ml  Output   2185 ml  Net    555 ml     Exam: Followup exam completed. Patient admitted at 3:50 AM today  Scheduled Meds:  Scheduled Meds: . atorvastatin  20 mg Oral QPM  . carvedilol  3.125 mg Oral BID WC  . cefTRIAXone (ROCEPHIN)  IV  1 g Intravenous Q24H  . docusate sodium  100 mg Oral BID  . feeding supplement (ENSURE COMPLETE)  237 mL Oral TID BM  . ferrous sulfate  325 mg  Oral QPM  . isosorbide mononitrate  30 mg Oral QPM  . magnesium sulfate 1 - 4 g bolus IVPB  4 g Intravenous Once  . sodium chloride  3 mL Intravenous Q12H  .  tamsulosin  0.4 mg Oral QPC supper   Continuous Infusions: . sodium chloride 150 mL/hr (10/23/13 1230)  . amiodarone (NEXTERONE PREMIX) 360 mg/200 mL dextrose 60 mg/hr (10/23/13 1200)   Followed by  . amiodarone (NEXTERONE PREMIX) 360 mg/200 mL dextrose      **Reviewed in detail by the Attending Physician  Data Reviewed: Basic Metabolic Panel:  Recent Labs Lab 10/22/13 1352 10/22/13 1530 10/22/13 2210 10/23/13 0540  NA 140 130* 139 135  K 6.3* 6.0* 4.8 4.7  CL 97* 92* 100 100  CO2 25 21 21 21   GLUCOSE 127* 131* 97 82  BUN 66* 66* 64* 61*  CREATININE 5.9* 5.90* 5.60* 5.47*  CALCIUM 9.4 9.4 8.5 8.0*  MG  --   --   --  1.1*  PHOS  --   --  5.0* 5.4*   Liver Function Tests:  Recent Labs Lab 10/22/13 1530 10/23/13 0540  AST 23 18  ALT 20 14  ALKPHOS 98 79  BILITOT 0.4 0.2*  PROT 6.6 5.6*  ALBUMIN 2.8* 2.3*   No results found for this basename: LIPASE, AMYLASE,  in the last 168 hours No results found for this basename: AMMONIA,  in the last 168 hours CBC:  Recent Labs Lab 10/22/13 1349 10/22/13 1530 10/23/13 0540  WBC 10.9* 10.7* 7.1  NEUTROABS 9.5* 9.5*  --   HGB 11.0* 10.4* 9.1*  HCT 33.2* 30.6* 26.8*  MCV 91 89.2 88.7  PLT 50* 45* 32*   Cardiac Enzymes:  Recent Labs Lab 10/23/13 0540 10/23/13 1025  TROPONINI <0.30 <0.30   BNP (last 3 results)  Recent Labs  03/05/13 1208  PROBNP 557.0*   CBG: No results found for this basename: GLUCAP,  in the last 168 hours  Recent Results (from the past 240 hour(s))  MRSA PCR SCREENING     Status: None   Collection Time    10/23/13  4:14 AM      Result Value Range Status   MRSA by PCR NEGATIVE  NEGATIVE Final   Comment:            The GeneXpert MRSA Assay (FDA     approved for NASAL specimens     only), is one component of a     comprehensive MRSA colonization     surveillance program. It is not     intended to diagnose MRSA     infection nor to guide or     monitor treatment for     MRSA  infections.     Studies:  Recent x-ray studies have been reviewed in detail by the Attending Physician     Junious Silk, ANP [facility] Office  [phone] Pager [phone]  **If unable to reach the above provider after paging please contact the Flow Manager @ [phone]  On-Call/Text Page:      [email or web address]      password [email or web address]  If 7PM-7AM, please contact night-coverage [email or web address] Password [email or web address] 10/23/2013, 1:22 PM   LOS: 1 day   I have examined the patient, reviewed the chart and modified the above note which I agree with.   Nirvi Boehler,MD [phone] 10/23/2013, 3:53 PM

## 2013-10-24 DIAGNOSIS — I4891 Unspecified atrial fibrillation: Secondary | ICD-10-CM

## 2013-10-24 DIAGNOSIS — N179 Acute kidney failure, unspecified: Principal | ICD-10-CM

## 2013-10-24 LAB — RENAL FUNCTION PANEL
Albumin: 1.8 g/dL — ABNORMAL LOW (ref 3.5–5.2)
BUN: 48 mg/dL — ABNORMAL HIGH (ref 6–23)
Calcium: 7.4 mg/dL — ABNORMAL LOW (ref 8.4–10.5)
Creatinine, Ser: 4.02 mg/dL — ABNORMAL HIGH (ref 0.50–1.35)
GFR calc non Af Amer: 13 mL/min — ABNORMAL LOW (ref >90)
Glucose, Bld: 90 mg/dL (ref 70–99)
Phosphorus: 4.4 mg/dL (ref 2.3–4.6)
Sodium: 136 mEq/L (ref 135–145)

## 2013-10-24 LAB — CBC
HCT: 21.5 % — ABNORMAL LOW (ref 39.0–52.0)
MCH: 31.7 pg (ref 26.0–34.0)
MCHC: 35.3 g/dL (ref 30.0–36.0)
MCV: 89.6 fL (ref 78.0–100.0)
Platelets: 45 10*3/uL — ABNORMAL LOW (ref 150–400)
RBC: 2.4 MIL/uL — ABNORMAL LOW (ref 4.22–5.81)
RDW: 16.5 % — ABNORMAL HIGH (ref 11.5–15.5)

## 2013-10-24 LAB — PREPARE RBC (CROSSMATCH)

## 2013-10-24 MED ORDER — AMIODARONE HCL 200 MG PO TABS: 400.0000 mg | ORAL_TABLET | Freq: Two times a day (BID) | ORAL | Status: DC

## 2013-10-24 MED ORDER — AMIODARONE HCL IN DEXTROSE 360-4.14 MG/200ML-% IV SOLN
30.0000 mg/h | INTRAVENOUS | Status: AC
  Administered 2013-10-24 (×3): 30 mg/h via INTRAVENOUS
  Filled 2013-10-24 (×6): qty 200

## 2013-10-24 NOTE — Progress Notes (Addendum)
Patient ID: [name] [name], male   DOB: [birth year], 79 y.o.   MRN: [id number]    SUBJECTIVE: No dyspnea or chest pain.  HR in 90s this morning, atrial fibrillation.  Urine output has improved some.  Still having a hard time eating. Rigors this morning, no fever.   [name] atorvastatin  20 mg Oral QPM  . cefTRIAXone (ROCEPHIN)  IV  1 g Intravenous Q24H  . docusate sodium  100 mg Oral BID  . feeding supplement (ENSURE COMPLETE)  237 mL Oral TID BM  . ferrous sulfate  325 mg Oral QPM  . isosorbide mononitrate  30 mg Oral QPM  . sodium chloride  3 mL Intravenous Q12H  . tamsulosin  0.4 mg Oral QPC supper  amiodarone gtt    Filed Vitals:   10/23/13 1532 10/23/13 2000 10/24/13 0020 10/24/13 0400  BP:  103/71 87/65 94/48   Pulse: 120 122 97 80  Temp:  98.5 F (36.9 C) 98 F (36.7 C) 98.7 F (37.1 C)  TempSrc:  Oral Oral Oral  Resp: 16 13 20 15   Height:      Weight:      SpO2: 100% 100% 100% 100%    Intake/Output Summary (Last 24 hours) at 10/24/13 0904 Last data filed at 10/24/13 0400  Gross per 24 hour  Intake   2320 ml  Output   1400 ml  Net    920 ml    LABS: Basic Metabolic Panel:  Recent Labs  78/29/56 0540 10/24/13 0540  NA 135 136  K 4.7 3.9  CL 100 101  CO2 21 22  GLUCOSE 82 90  BUN 61* 48*  CREATININE 5.47* 4.02*  CALCIUM 8.0* 7.4*  MG 1.1*  --   PHOS 5.4* 4.4   Liver Function Tests:  Recent Labs  10/22/13 1530 10/23/13 0540 10/24/13 0540  AST 23 18  --   ALT 20 14  --   ALKPHOS 98 79  --   BILITOT 0.4 0.2*  --   PROT 6.6 5.6*  --   ALBUMIN 2.8* 2.3* 1.8*   No results found for this basename: LIPASE, AMYLASE,  in the last 72 hours CBC:  Recent Labs  10/22/13 1349  10/22/13 1530 10/23/13 0540 10/24/13 0540  WBC 10.9*  < > 10.7* 7.1 5.7  NEUTROABS 9.5*  --  9.5*  --   --   HGB 11.0*  < > 10.4* 9.1* 7.6*  HCT 33.2*  < > 30.6* 26.8* 21.5*  MCV 91  < > 89.2 88.7 89.6  PLT 50*  < > 45* 32* 45*  < > = values in this interval not  displayed. Cardiac Enzymes:  Recent Labs  10/23/13 0540 10/23/13 1025 10/23/13 1700  TROPONINI <0.30 <0.30 <0.30   BNP: No components found with this basename: POCBNP,  D-Dimer: No results found for this basename: DDIMER,  in the last 72 hours Hemoglobin A1C: No results found for this basename: HGBA1C,  in the last 72 hours Fasting Lipid Panel: No results found for this basename: CHOL, HDL, LDLCALC, TRIG, CHOLHDL, LDLDIRECT,  in the last 72 hours Thyroid Function Tests:  Recent Labs  10/23/13 0540  TSH 1.228   Anemia Panel: No results found for this basename: VITAMINB12, FOLATE, FERRITIN, TIBC, IRON, RETICCTPCT,  in the last 72 hours  RADIOLOGY: US Renal  10/23/2013   CLINICAL DATA:  Acute renal failure  EXAM: RENAL/URINARY TRACT ULTRASOUND COMPLETE  COMPARISON:  Abdominal CT 04/30/2013  FINDINGS: Right Kidney  Length: 10 cm  in length, although there is diffuse cortical thinning (approximately 9 mm in the interpolar region). No hydronephrosis. There are innumerable cysts, the largest measured is in the upper pole at 4 cm, although comparison CT shows a large lesion from the lower pole measuring 6 cm. Increased renal echogenicity consistent with medical renal disease. Previously seen right-sided stones not evident currently.  Left Kidney  Length: 11 cm in length but diffuse cortical thinning. No hydronephrosis. There are numerous hilar echogenic foci, best seen in the interpolar and lower pole regions, consistent with stones. Multiple cysts, the largest in the lower pole at 5 cm.  Bladder  Appears normal for distension.  94 cc volume.  IMPRESSION: 1. No hydronephrosis. 2. Polycystic kidneys and medical renal disease/atrophy. 3. Left nephrolithiasis.   Electronically Signed   By: Tiburcio Pea M.D.   On: 10/23/2013 00:57   Dg Chest Port 1 View  10/22/2013   CLINICAL DATA:  Renal failure.  Lung cancer.  EXAM: PORTABLE CHEST - 1 VIEW  COMPARISON:  Chest x-ray 10/12/2012  FINDINGS:  Left Port-A-Cath is in place with the tip in the SVC near the cavoatrial junction. Prior median sternotomy. Heart is normal size. No focal airspace opacities or visible effusion. No acute bony abnormality.  IMPRESSION: No active disease.   Electronically Signed   By: Charlett Nose M.D.   On: 10/22/2013 16:28    PHYSICAL EXAM General: NAD Neck: No JVD, no thyromegaly or thyroid nodule.  Lungs: Clear to auscultation bilaterally with normal respiratory effort. CV: Nondisplaced PMI.  Heart regular S1/S2, no S3/S4, 1/6 HSM apex.  No peripheral edema.  No carotid bruit.  Normal pedal pulses.  Abdomen: Soft, nontender, no hepatosplenomegaly, no distention.  Neurologic: Alert and oriented x 3.  Psych: Normal affect. Extremities: No clubbing or cyanosis.   TELEMETRY: Reviewed telemetry pt in atrial fibrillation, rate 90s  ASSESSMENT AND PLAN: 79 yo with history of CAD, paroxysmal atrial fibrillation, ischemic cardiomyopathy, and ischemic MR had developed lung cancer and is s/p 2 rounds of chemotherapy. He was admitted with dizziness and found to be in AKI on CKD.  In the hospital, he was noted to be in atrial flutter with RVR.  1. Atrial flutter/fibrillation: h/o paroxysmal atrial fibrillation.  He was in atrial flutter yesterday, now in fibrillation.  Rate is better-controlled today but still in 90s to low 100s.  He is off coumadin with thrombocytopenia and severe epistaxis.    - Continue IV amiodarone for rate control.  Can stop Coreg for now with marginal blood pressure.  - Will not be able to safely electrically cardiovert as he cannot be on anticoagulation at this time.  2. CAD: Stable with no ischemic symptoms.  Continue statin.  Off ASA with thrombocytopenia.  3. Ischemic cardiomyopathy: Last EF 45-50%.  No JVD.  4. Ischemic mitral regurgitation: Surprisingly, only mild MR on last echo (had been severe).  He does not have a marked murmur on exam.  5. AKI on CKD: Suspected dehydration from poor po  intake, nausea and vomiting associated with chemotherapy.  Interestingly, urine sodium was high when checked and specific gravity low.  Probable component of ATN.  - nephrology involved. - IV fluid per primary service.  Watch volume status carefully.  6. ID: Rigors this morning, no fever.  He is on ceftriaxone empirically for possible UTI.   [name] 10/24/2013 9:04 AM

## 2013-10-24 NOTE — Progress Notes (Signed)
Acute Kidney Injury due to dehydration and possble chemo  CKD  Metastatic Adenoca  Anemia     Plan: Fluid rehydration, decrease rate, consider PRBCs  Subjective: Interval History:Feels better , no appetite  Objective: Vital signs in last 24 hours: Temp:  [97.3 F (36.3 C)-98.7 F (37.1 C)] 98.7 F (37.1 C) (10/31 0400) Pulse Rate:  [75-122] 80 (10/31 0400) Resp:  [13-22] 15 (10/31 0400) BP: (87-103)/(48-71) 94/48 mmHg (10/31 0400) SpO2:  [100 %] 100 % (10/31 0400) Weight change:   Intake/Output from previous day: 10/30 0701 - 10/31 0700 In: 2760 [P.O.:1210; I.V.:1550] Out: 1775 [Urine:775; Stool:1000] Intake/Output this shift:    General appearance: alert and cooperative facial puffiness Lung clear Cor RRR Ext no edema  Lab Results:  Recent Labs  10/23/13 0540 10/24/13 0540  WBC 7.1 5.7  HGB 9.1* 7.6*  HCT 26.8* 21.5*  PLT 32* 45*   BMET:  Recent Labs  10/23/13 0540 10/24/13 0540  NA 135 136  K 4.7 3.9  CL 100 101  CO2 21 22  GLUCOSE 82 90  BUN 61* 48*  CREATININE 5.47* 4.02*  CALCIUM 8.0* 7.4*   No results found for this basename: PTH,  in the last 72 hours Iron Studies: No results found for this basename: IRON, TIBC, TRANSFERRIN, FERRITIN,  in the last 72 hours Studies/Results: US Renal  10/23/2013   CLINICAL DATA:  Acute renal failure  EXAM: RENAL/URINARY TRACT ULTRASOUND COMPLETE  COMPARISON:  Abdominal CT 04/30/2013  FINDINGS: Right Kidney  Length: 10 cm in length, although there is diffuse cortical thinning (approximately 9 mm in the interpolar region). No hydronephrosis. There are innumerable cysts, the largest measured is in the upper pole at 4 cm, although comparison CT shows a large lesion from the lower pole measuring 6 cm. Increased renal echogenicity consistent with medical renal disease. Previously seen right-sided stones not evident currently.  Left Kidney  Length: 11 cm in length but diffuse cortical thinning. No hydronephrosis. There are  numerous hilar echogenic foci, best seen in the interpolar and lower pole regions, consistent with stones. Multiple cysts, the largest in the lower pole at 5 cm.  Bladder  Appears normal for distension.  94 cc volume.  IMPRESSION: 1. No hydronephrosis. 2. Polycystic kidneys and medical renal disease/atrophy. 3. Left nephrolithiasis.   Electronically Signed   By: Tiburcio Pea M.D.   On: 10/23/2013 00:57   Dg Chest Port 1 View  10/22/2013   CLINICAL DATA:  Renal failure.  Lung cancer.  EXAM: PORTABLE CHEST - 1 VIEW  COMPARISON:  Chest x-ray 10/12/2012  FINDINGS: Left Port-A-Cath is in place with the tip in the SVC near the cavoatrial junction. Prior median sternotomy. Heart is normal size. No focal airspace opacities or visible effusion. No acute bony abnormality.  IMPRESSION: No active disease.   Electronically Signed   By: Charlett Nose M.D.   On: 10/22/2013 16:28   Scheduled: . atorvastatin  20 mg Oral QPM  . cefTRIAXone (ROCEPHIN)  IV  1 g Intravenous Q24H  . docusate sodium  100 mg Oral BID  . feeding supplement (ENSURE COMPLETE)  237 mL Oral TID BM  . ferrous sulfate  325 mg Oral QPM  . isosorbide mononitrate  30 mg Oral QPM  . sodium chloride  3 mL Intravenous Q12H  . tamsulosin  0.4 mg Oral QPC supper     LOS: 2 days   Glynnis Gavel C 10/24/2013,9:36 AM

## 2013-10-24 NOTE — Progress Notes (Signed)
[facility] Progress Note [place] TEAM 1 - Stepdown ICU Team   [name] [name] XBJ:[id number] DOB: May 29, 1930 DOA: 10/22/2013 PCP: [name], MD  Brief narrative: 79 year old male with multiple medical problems. Most recently has been undergoing chemotherapy under the direction of Dr. Myna Hidalgo for lung CA. He has a chronic high output ileostomy syndrome which he manages quite nicely. He was recently hospitalized due to uncontrolled epistaxis related to chemotherapy induced thrombocytopenia.  On 10/22/2013 he followed up with his oncologist and laboratory data revealed acute on chronic renal failure with hyperkalemia and creatinine 5.9 up from a baseline of 1.8. He was subsequently transferred to [facility] and directly admitted to the step down unit. An attempt was made to place a Foley catheter but was unsuccessful. A urinalysis was obtained and this appeared consistent with a urinary tract infection. According to the patient he has had generalized malaise for several days with poor appetite and decreased oral intake but had worsened with most recent chemotherapy treatment on 10/01/2013. Patient also endorsed increased weakness and lightheadedness with mobilization especially when standing upright.  Assessment/Plan:    Acute renal failure/CKD (chronic kidney disease) -cr slowly decreasing with hydration -cont IVFs and follow lytes -Dr. Myna Hidalgo says current chemo not nephrotoxic - pt states normally he drinks 64 oz of water daily but recently had poor intake and likely just got behind on his fluids given chronic high ileostomy output    UTI (urinary tract infection) -cx no growth -UA highly suggestive of UTI despite negative cx and pt with rigors without elevated temp today so will cont empiric anbx's    Dehydration -likely due to poor PO intake recently    High output ileostomy - CHRONIC -allow for unlimited intake of fluids including Gatorade    Metastatic  adenocarcinoma lung/Antineoplastic chemotherapy induced pancytopenia -Ennever following -labs stable    Chronic systolic CHF (congestive heart failure) -compensated / currently dehydrated -cont coreg    Essential hypertension, benign -BP soft so Imdur dc'd 10/30 and Cards dc'd Coreg 10/31    Paroxysmal atrial fibrillation -rate initially not controlled and likely due to [facility] - better -cont IV Amiodarone -Cardiology is following     Anemia of chronic disease -hgb has drifted down almost 2 gms after hydration so will give 1 unit PRBCs today in setting of known CAD  DVT prophylaxis: SCDs Code Status: DO NOT RESUSCITATE Family Communication: Patient and wife at bedside Disposition Plan/Expected LOS: Remain in step down  Consultants: Oncology Cardiology Nephrology   Procedures: None  Antibiotics: Rocephin 10/29 >>>  HPI/Subjective: Patient alert and primarily endorsing increased frequency of urination but no dysuria. No chest pain or shortness of breath. No apparent awareness of tachypalpitations.  Objective: Blood pressure 99/60, pulse 121, temperature 98.6 F (37 C), temperature source Oral, resp. rate 25, height 5\' 4"  (1.626 m), weight 55.566 kg (122 lb 8 oz), SpO2 100.00%.  Intake/Output Summary (Last 24 hours) at 10/24/13 1109 Last data filed at 10/24/13 0827  Gross per 24 hour  Intake   2120 ml  Output   2100 ml  Net     20 ml   Exam: General: No acute respiratory distress Lungs: Clear to auscultation bilaterally without wheezes or crackles Cardiovascular: Regular rate without murmur gallop or rub Abdomen: Nontender, nondistended, soft, bowel sounds positive, no rebound, no ascites, no appreciable mass Extremities: No significant cyanosis, clubbing, or edema bilateral lower extremities   Scheduled Meds:  Scheduled Meds: . atorvastatin  20 mg Oral QPM  . cefTRIAXone (  ROCEPHIN)  IV  1 g Intravenous Q24H  . docusate sodium  100 mg Oral BID  . feeding  supplement (ENSURE COMPLETE)  237 mL Oral TID BM  . ferrous sulfate  325 mg Oral QPM  . isosorbide mononitrate  30 mg Oral QPM  . sodium chloride  3 mL Intravenous Q12H  . tamsulosin  0.4 mg Oral QPC supper   Continuous Infusions: . sodium chloride 100 mL/hr at 10/24/13 1058  . amiodarone (NEXTERONE PREMIX) 360 mg/200 mL dextrose     Data Reviewed: Basic Metabolic Panel:  Recent Labs Lab 10/22/13 1352 10/22/13 1530 10/22/13 2210 10/23/13 0540 10/24/13 0540  NA 140 130* 139 135 136  K 6.3* 6.0* 4.8 4.7 3.9  CL 97* 92* 100 100 101  CO2 25 21 21 21 22   GLUCOSE 127* 131* 97 82 90  BUN 66* 66* 64* 61* 48*  CREATININE 5.9* 5.90* 5.60* 5.47* 4.02*  CALCIUM 9.4 9.4 8.5 8.0* 7.4*  MG  --   --   --  1.1*  --   PHOS  --   --  5.0* 5.4* 4.4   Liver Function Tests:  Recent Labs Lab 10/22/13 1530 10/23/13 0540 10/24/13 0540  AST 23 18  --   ALT 20 14  --   ALKPHOS 98 79  --   BILITOT 0.4 0.2*  --   PROT 6.6 5.6*  --   ALBUMIN 2.8* 2.3* 1.8*   CBC:  Recent Labs Lab 10/22/13 1349 10/22/13 1530 10/23/13 0540 10/24/13 0540  WBC 10.9* 10.7* 7.1 5.7  NEUTROABS 9.5* 9.5*  --   --   HGB 11.0* 10.4* 9.1* 7.6*  HCT 33.2* 30.6* 26.8* 21.5*  MCV 91 89.2 88.7 89.6  PLT 50* 45* 32* 45*   Cardiac Enzymes:  Recent Labs Lab 10/23/13 0540 10/23/13 1025 10/23/13 1700  TROPONINI <0.30 <0.30 <0.30   BNP (last 3 results)  Recent Labs  03/05/13 1208  PROBNP 557.0*    Recent Results (from the past 240 hour(s))  URINE CULTURE     Status: None   Collection Time    10/22/13  4:40 PM      Result Value Range Status   Specimen Description URINE, CLEAN CATCH   Final   Special Requests NONE   Final   Culture  Setup Time     Final   Value: 10/23/2013 01:57     Performed at [facility] Count     Final   Value: NO GROWTH     Performed at [facility]   Culture     Final   Value: NO GROWTH     Performed at [facility]   Report Status  10/23/2013 FINAL   Final  MRSA PCR SCREENING     Status: None   Collection Time    10/23/13  4:14 AM      Result Value Range Status   MRSA by PCR NEGATIVE  NEGATIVE Final   Comment:            The GeneXpert MRSA Assay (FDA     approved for NASAL specimens     only), is one component of a     comprehensive MRSA colonization     surveillance program. It is not     intended to diagnose MRSA     infection nor to guide or     monitor treatment for     MRSA infections.     Studies:  Recent x-ray studies have been reviewed in detail by the Attending Physician     Junious Silk, ANP [facility] Office  [phone] Pager [phone]  **If unable to reach the above provider after paging please contact the Flow Manager @ [phone]  On-Call/Text Page:      [email or web address]      password [email or web address]  If 7PM-7AM, please contact night-coverage [email or web address] Password [email or web address] 10/24/2013, 11:09 AM   LOS: 2 days   I have personally examined this patient and reviewed the entire database. I have reviewed the above note, made any necessary editorial changes, and agree with its content.  Lonia Blood, MD [facility]

## 2013-10-25 DIAGNOSIS — Z66 Do not resuscitate: Secondary | ICD-10-CM

## 2013-10-25 DIAGNOSIS — I255 Ischemic cardiomyopathy: Secondary | ICD-10-CM | POA: Diagnosis present

## 2013-10-25 DIAGNOSIS — Z9229 Personal history of other drug therapy: Secondary | ICD-10-CM

## 2013-10-25 LAB — CBC
HCT: 24.9 % — ABNORMAL LOW (ref 39.0–52.0)
MCH: 30.4 pg (ref 26.0–34.0)
MCHC: 33.7 g/dL (ref 30.0–36.0)
Platelets: 49 10*3/uL — ABNORMAL LOW (ref 150–400)
RBC: 2.76 MIL/uL — ABNORMAL LOW (ref 4.22–5.81)
RDW: 15.8 % — ABNORMAL HIGH (ref 11.5–15.5)
WBC: 6.5 10*3/uL (ref 4.0–10.5)

## 2013-10-25 LAB — BASIC METABOLIC PANEL
BUN: 35 mg/dL — ABNORMAL HIGH (ref 6–23)
Calcium: 7.4 mg/dL — ABNORMAL LOW (ref 8.4–10.5)
Chloride: 107 mEq/L (ref 96–112)
Creatinine, Ser: 2.84 mg/dL — ABNORMAL HIGH (ref 0.50–1.35)
GFR calc Af Amer: 23 mL/min — ABNORMAL LOW (ref >90)
GFR calc non Af Amer: 20 mL/min — ABNORMAL LOW (ref >90)
Potassium: 3.6 mEq/L (ref 3.5–5.1)

## 2013-10-25 LAB — TYPE AND SCREEN: Unit division: 0

## 2013-10-25 MED ORDER — AMIODARONE HCL 200 MG PO TABS
400.0000 mg | ORAL_TABLET | Freq: Two times a day (BID) | ORAL | Status: DC
  Administered 2013-10-25 – 2013-10-26 (×2): 400 mg via ORAL
  Filled 2013-10-25 (×3): qty 2

## 2013-10-25 NOTE — Progress Notes (Signed)
Acute Kidney Injury due to dehydration and possble chemo (Carboplitin has a "Nephrotoxicity is less common and severe than  that associated with cisplatin; concomitant IV hydration and diuresis generally not necessary. 1 10 11 24 27  37 92 93              However, consider the possibility that nephrotoxicity may be potentiated by other nephrotoxic drugs." CKD  Metastatic Adenoca  Anemia  A Fib on Amio IV Plan: Cont. Fluid rehydration, needs to drink more  Subjective: Interval History: Wants to go home  Objective: Vital signs in last 24 hours: Temp:  [97.9 F (36.6 C)-99.4 F (37.4 C)] 98.5 F (36.9 C) (11/01 0332) Pulse Rate:  [90-125] 100 (11/01 0729) Resp:  [14-23] 14 (11/01 0729) BP: (92-113)/(48-72) 97/56 mmHg (11/01 0729) SpO2:  [96 %-100 %] 100 % (11/01 0729) Weight change:   Intake/Output from previous day: 10/31 0701 - 11/01 0700 In: 2382.5 [P.O.:720; I.V.:1250; Blood:362.5; IV Piggyback:50] Out: 2100 [Urine:1100; Stool:1000] Intake/Output this shift:    General appearance: alert, cooperative and grumpy Chest wall: no tenderness Cardio: regular rate and rhythm, S1, S2 normal, no murmur, click, rub or gallop Extremities: extremities normal, atraumatic, no cyanosis or edema  Lab Results:  Recent Labs  10/24/13 0540 10/25/13 0456  WBC 5.7 6.5  HGB 7.6* 8.4*  HCT 21.5* 24.9*  PLT 45* 49*   BMET:  Recent Labs  10/24/13 0540 10/25/13 0456  NA 136 138  K 3.9 3.6  CL 101 107  CO2 22 20  GLUCOSE 90 78  BUN 48* 35*  CREATININE 4.02* 2.84*  CALCIUM 7.4* 7.4*   No results found for this basename: PTH,  in the last 72 hours Iron Studies: No results found for this basename: IRON, TIBC, TRANSFERRIN, FERRITIN,  in the last 72 hours Studies/Results: No results found.  Scheduled: . atorvastatin  20 mg Oral QPM  . cefTRIAXone (ROCEPHIN)  IV  1 g Intravenous Q24H  . docusate sodium  100 mg Oral BID  . feeding supplement (ENSURE COMPLETE)  237 mL Oral TID BM  .  ferrous sulfate  325 mg Oral QPM  . isosorbide mononitrate  30 mg Oral QPM  . sodium chloride  3 mL Intravenous Q12H  . tamsulosin  0.4 mg Oral QPC supper      LOS: 3 days   Rebekka Lobello C 10/25/2013,12:27 PM

## 2013-10-25 NOTE — Progress Notes (Signed)
Patient ID: [name] [name], male   DOB: 02-09-1930, 79 y.o.   MRN: [id number]    SUBJECTIVE:   Patient is continuously talking about trying to go home.   Filed Vitals:   10/24/13 1919 10/24/13 2333 10/25/13 0332 10/25/13 0729  BP: 105/52 113/72 101/48 97/56  Pulse:  109 90 100  Temp: 98.5 F (36.9 C) 97.9 F (36.6 C) 98.5 F (36.9 C)   TempSrc: Oral Oral Oral   Resp:  20 16 14   Height:      Weight:      SpO2:  100% 100% 100%     Intake/Output Summary (Last 24 hours) at 10/25/13 1001 Last data filed at 10/25/13 0507  Gross per 24 hour  Intake 1692.5 ml  Output   1500 ml  Net  192.5 ml    LABS: Basic Metabolic Panel:  Recent Labs  40/98/11 0540 10/24/13 0540 10/25/13 0456  NA 135 136 138  K 4.7 3.9 3.6  CL 100 101 107  CO2 21 22 20   GLUCOSE 82 90 78  BUN 61* 48* 35*  CREATININE 5.47* 4.02* 2.84*  CALCIUM 8.0* 7.4* 7.4*  MG 1.1*  --   --   PHOS 5.4* 4.4  --    Liver Function Tests:  Recent Labs  10/22/13 1530 10/23/13 0540 10/24/13 0540  AST 23 18  --   ALT 20 14  --   ALKPHOS 98 79  --   BILITOT 0.4 0.2*  --   PROT 6.6 5.6*  --   ALBUMIN 2.8* 2.3* 1.8*   No results found for this basename: LIPASE, AMYLASE,  in the last 72 hours CBC:  Recent Labs  10/22/13 1349 10/22/13 1530  10/24/13 0540 10/25/13 0456  WBC 10.9* 10.7*  < > 5.7 6.5  NEUTROABS 9.5* 9.5*  --   --   --   HGB 11.0* 10.4*  < > 7.6* 8.4*  HCT 33.2* 30.6*  < > 21.5* 24.9*  MCV 91 89.2  < > 89.6 90.2  PLT 50* 45*  < > 45* 49*  < > = values in this interval not displayed. Cardiac Enzymes:  Recent Labs  10/23/13 0540 10/23/13 1025 10/23/13 1700  TROPONINI <0.30 <0.30 <0.30   BNP: No components found with this basename: POCBNP,  D-Dimer: No results found for this basename: DDIMER,  in the last 72 hours Hemoglobin A1C: No results found for this basename: HGBA1C,  in the last 72 hours Fasting Lipid Panel: No results found for this basename: CHOL, HDL, LDLCALC, TRIG,  CHOLHDL, LDLDIRECT,  in the last 72 hours Thyroid Function Tests:  Recent Labs  10/23/13 0540  TSH 1.228    RADIOLOGY: US Renal  10/23/2013   CLINICAL DATA:  Acute renal failure  EXAM: RENAL/URINARY TRACT ULTRASOUND COMPLETE  COMPARISON:  Abdominal CT 04/30/2013  FINDINGS: Right Kidney  Length: 10 cm in length, although there is diffuse cortical thinning (approximately 9 mm in the interpolar region). No hydronephrosis. There are innumerable cysts, the largest measured is in the upper pole at 4 cm, although comparison CT shows a large lesion from the lower pole measuring 6 cm. Increased renal echogenicity consistent with medical renal disease. Previously seen right-sided stones not evident currently.  Left Kidney  Length: 11 cm in length but diffuse cortical thinning. No hydronephrosis. There are numerous hilar echogenic foci, best seen in the interpolar and lower pole regions, consistent with stones. Multiple cysts, the largest in the lower pole at 5 cm.  Bladder  Appears normal for distension.  94 cc volume.  IMPRESSION: 1. No hydronephrosis. 2. Polycystic kidneys and medical renal disease/atrophy. 3. Left nephrolithiasis.   Electronically Signed   By: Tiburcio Pea M.D.   On: 10/23/2013 00:57   Dg Chest Port 1 View  10/22/2013   CLINICAL DATA:  Renal failure.  Lung cancer.  EXAM: PORTABLE CHEST - 1 VIEW  COMPARISON:  Chest x-ray 10/12/2012  FINDINGS: Left Port-A-Cath is in place with the tip in the SVC near the cavoatrial junction. Prior median sternotomy. Heart is normal size. No focal airspace opacities or visible effusion. No acute bony abnormality.  IMPRESSION: No active disease.   Electronically Signed   By: Charlett Nose M.D.   On: 10/22/2013 16:28    PHYSICAL EXAM    Patient continues to asked to go home. Is lying flat in bed. Lungs reveal scattered rhonchi. Cardiac exam reveals S1 and S2.   TELEMETRY:    I reviewed telemetry today October 25, 2013. There is atrial fibrillation. The  rate is in the range of 90 beats per minute.   ASSESSMENT AND PLAN:    Paroxysmal atrial fibrillation     Atrial fib continues. The rate is controlled with IV amiodarone. Historically he was on low-dose oral amiodarone at home. It seems we should be able to switch him to oral amiodarone with the new higher loading dose if this is okay with the other physicians. He could be put on 400 mg by mouth twice daily to start with.    Hx of amiodarone therapy    DNR (do not resuscitate)   [name] 10/25/2013 10:01 AM

## 2013-10-25 NOTE — Progress Notes (Signed)
[facility] Progress Note [place] TEAM 1 - Stepdown ICU Team   [name] [name] WUX:[id number] DOB: 1930-03-27 DOA: 10/22/2013 PCP: [name], MD  Brief narrative: 79 year old male with multiple medical problems. Most recently has been undergoing chemotherapy under the direction of Dr. Myna Hidalgo for lung CA. He has a chronic high output ileostomy syndrome which he manages quite nicely. He was recently hospitalized due to uncontrolled epistaxis related to chemotherapy induced thrombocytopenia.  On 10/22/2013 he followed up with his Oncologist and laboratory data revealed acute on chronic renal failure with hyperkalemia and creatinine 5.9 up from a baseline of 1.8. He was subsequently transferred to [facility] and directly admitted to the step down unit. An attempt was made to place a Foley catheter but was unsuccessful. A urinalysis was obtained and this appeared consistent with a urinary tract infection. According to the patient he had generalized malaise for several days with poor appetite and decreased oral intake but his sx had worsened with his most recent chemotherapy treatment on 10/01/2013. Patient also endorsed increased weakness and lightheadedness with mobilization especially when standing upright.  Assessment/Plan:  Acute renal failure in the setting of CKD  -baseline of 1.8 -cr slowly improving with hydration -Dr. Myna Hidalgo says current chemo not nephrotoxic  UTI  -cx no growth -UA highly suggestive of UTI - tx w/ empiric abx in setting of symptoms suggestive of UTI  Dehydration -due to poor PO intake recently in setting of daily high ostomy output - cont IVF - pt still orthostatic so will increase rate gently  High output ileostomy - CHRONIC -allow for unlimited intake of fluids including Gatorade  Metastatic adenocarcinoma lung / Antineoplastic chemotherapy induced pancytopenia -Ennever following -labs stable  Chronic systolic CHF (congestive heart  failure) -compensated / currently dehydrated -cont coreg  Essential hypertension, benign -BP soft so Imdur dc'd 10/30 and Cards dc'd Coreg 10/31  Paroxysmal atrial fibrillation -rate initially not controlled and likely due to [facility] - better -cont Amiodarone with change to oral dosing as suggested by Cards  -Cardiology is following   Anemia of chronic disease -hgb drifted down almost 2 gms after hydration so gave 1 unit PRBCs in setting of known CAD  DVT prophylaxis: SCDs Code Status: DO NOT RESUSCITATE Family Communication: Patient and wife at bedside Disposition Plan/Expected LOS: Remain in step down  Consultants: Oncology Cardiology Nephrology   Procedures: None  Antibiotics: Rocephin 10/29 >>>  HPI/Subjective: Patient is alert and interactive.  He is very much fixation on going home.  He does admit to being somewhat dizzy upon standing earlier today however.  Objective: Blood pressure 97/56, pulse 100, temperature 97.9 F (36.6 C), temperature source Oral, resp. rate 14, height 5\' 4"  (1.626 m), weight 55.566 kg (122 lb 8 oz), SpO2 100.00%.  Intake/Output Summary (Last 24 hours) at 10/25/13 1345 Last data filed at 10/25/13 1227  Gross per 24 hour  Intake 1032.5 ml  Output   1700 ml  Net -667.5 ml   Exam: General: No acute respiratory distress Lungs: Clear to auscultation bilaterally without wheezes or crackles Cardiovascular: Regular rate without murmur gallop or rub Abdomen: Nontender, nondistended, soft, bowel sounds positive, no rebound, no ascites, no appreciable mass Extremities: No significant cyanosis, clubbing, edema bilateral lower extremities   Scheduled Meds:  Scheduled Meds: . atorvastatin  20 mg Oral QPM  . cefTRIAXone (ROCEPHIN)  IV  1 g Intravenous Q24H  . docusate sodium  100 mg Oral BID  . feeding supplement (ENSURE COMPLETE)  237 mL Oral TID  BM  . ferrous sulfate  325 mg Oral QPM  . isosorbide mononitrate  30 mg Oral QPM  . sodium chloride   3 mL Intravenous Q12H  . tamsulosin  0.4 mg Oral QPC supper   Continuous Infusions: . sodium chloride 75 mL/hr at 10/25/13 0700  . amiodarone (NEXTERONE PREMIX) 360 mg/200 mL dextrose 30 mg/hr (10/25/13 0700)   Data Reviewed: Basic Metabolic Panel:  Recent Labs Lab 10/22/13 1530 10/22/13 2210 10/23/13 0540 10/24/13 0540 10/25/13 0456  NA 130* 139 135 136 138  K 6.0* 4.8 4.7 3.9 3.6  CL 92* 100 100 101 107  CO2 21 21 21 22 20   GLUCOSE 131* 97 82 90 78  BUN 66* 64* 61* 48* 35*  CREATININE 5.90* 5.60* 5.47* 4.02* 2.84*  CALCIUM 9.4 8.5 8.0* 7.4* 7.4*  MG  --   --  1.1*  --   --   PHOS  --  5.0* 5.4* 4.4  --    Liver Function Tests:  Recent Labs Lab 10/22/13 1530 10/23/13 0540 10/24/13 0540  AST 23 18  --   ALT 20 14  --   ALKPHOS 98 79  --   BILITOT 0.4 0.2*  --   PROT 6.6 5.6*  --   ALBUMIN 2.8* 2.3* 1.8*   CBC:  Recent Labs Lab 10/22/13 1349 10/22/13 1530 10/23/13 0540 10/24/13 0540 10/25/13 0456  WBC 10.9* 10.7* 7.1 5.7 6.5  NEUTROABS 9.5* 9.5*  --   --   --   HGB 11.0* 10.4* 9.1* 7.6* 8.4*  HCT 33.2* 30.6* 26.8* 21.5* 24.9*  MCV 91 89.2 88.7 89.6 90.2  PLT 50* 45* 32* 45* 49*   Cardiac Enzymes:  Recent Labs Lab 10/23/13 0540 10/23/13 1025 10/23/13 1700  TROPONINI <0.30 <0.30 <0.30   BNP (last 3 results)  Recent Labs  03/05/13 1208  PROBNP 557.0*    Recent Results (from the past 240 hour(s))  URINE CULTURE     Status: None   Collection Time    10/22/13  4:40 PM      Result Value Range Status   Specimen Description URINE, CLEAN CATCH   Final   Special Requests NONE   Final   Culture  Setup Time     Final   Value: 10/23/2013 01:57     Performed at [facility] Count     Final   Value: NO GROWTH     Performed at [facility]   Culture     Final   Value: NO GROWTH     Performed at [facility]   Report Status 10/23/2013 FINAL   Final  MRSA PCR SCREENING     Status: None   Collection Time     10/23/13  4:14 AM      Result Value Range Status   MRSA by PCR NEGATIVE  NEGATIVE Final   Comment:            The GeneXpert MRSA Assay (FDA     approved for NASAL specimens     only), is one component of a     comprehensive MRSA colonization     surveillance program. It is not     intended to diagnose MRSA     infection nor to guide or     monitor treatment for     MRSA infections.     Studies:  Recent x-ray studies have been reviewed in detail by the Attending Physician   Tinnie Gens  Silvestre Gunner, MD [facility] Office  [phone] Pager [phone]  On-Call/Text Page:      [email or web address]      password [email or web address]  If 7PM-7AM, please contact night-coverage [email or web address] Password [email or web address] 10/25/2013, 1:45 PM   LOS: 3 days

## 2013-10-26 LAB — CBC
HCT: 23.7 % — ABNORMAL LOW (ref 39.0–52.0)
Hemoglobin: 8.3 g/dL — ABNORMAL LOW (ref 13.0–17.0)
MCH: 31.4 pg (ref 26.0–34.0)
MCHC: 35 g/dL (ref 30.0–36.0)
Platelets: 60 10*3/uL — ABNORMAL LOW (ref 150–400)
RBC: 2.64 MIL/uL — ABNORMAL LOW (ref 4.22–5.81)

## 2013-10-26 LAB — BASIC METABOLIC PANEL
BUN: 28 mg/dL — ABNORMAL HIGH (ref 6–23)
CO2: 19 mEq/L (ref 19–32)
Chloride: 103 mEq/L (ref 96–112)
GFR calc Af Amer: 29 mL/min — ABNORMAL LOW (ref >90)
GFR calc non Af Amer: 25 mL/min — ABNORMAL LOW (ref >90)
Glucose, Bld: 77 mg/dL (ref 70–99)
Potassium: 3.3 mEq/L — ABNORMAL LOW (ref 3.5–5.1)

## 2013-10-26 MED ORDER — HEPARIN SOD (PORK) LOCK FLUSH 100 UNIT/ML IV SOLN
500.0000 [IU] | INTRAVENOUS | Status: AC | PRN
  Administered 2013-10-26: 500 [IU]

## 2013-10-26 MED ORDER — CEFUROXIME AXETIL 500 MG PO TABS: 500.0000 mg | ORAL_TABLET | Freq: Two times a day (BID) | ORAL | Status: DC

## 2013-10-26 MED ORDER — POTASSIUM CHLORIDE CRYS ER 20 MEQ PO TBCR
40.0000 meq | EXTENDED_RELEASE_TABLET | Freq: Once | ORAL | Status: AC
  Administered 2013-10-26: 40 meq via ORAL
  Filled 2013-10-26: qty 2

## 2013-10-26 MED ORDER — AMIODARONE HCL 400 MG PO TABS: 400.0000 mg | ORAL_TABLET | Freq: Two times a day (BID) | ORAL | Status: DC

## 2013-10-26 NOTE — Progress Notes (Signed)
Pt discharged home per MD order. All discharge instructions reviewed and all questions answered.  

## 2013-10-26 NOTE — Discharge Summary (Signed)
DISCHARGE SUMMARY  [name] [name]  MR#: [id number]  DOB:02-06-1930  Date of Admission: 10/22/2013 Date of Discharge: 10/26/2013  Attending Physician:Jonn Chaikin T  Patient's NFA:[id number],QIONGEXBM, MD  Consults:Treatment Team:  Trevor Iha, MD Josph Macho, MD [facility], MD  Disposition: D/C home   Follow-up Appts:     Follow-up Information   Follow up with METHENEY,CATHERINE, MD. Schedule an appointment as soon as possible for a visit in 3 days.   Specialty:  Family Medicine   Contact information:   [address] [place] [address] [phone]       Follow up with [name], MD. Schedule an appointment as soon as possible for a visit in 1 week.   Specialty:  Cardiology   Contact information:   [address] [place] [address] [phone]      Tests Needing Follow-up: -) recheck of electrolytes and renal function is recommended in 3-5 days -) assessment of heart rate is suggested -) assessment of volume status/BP is suggested  -) pt needs to see his Cardiologist within the next 7-10 days to determine when his amiodarone loading dose can be decreased  Discharge Diagnoses: Acute renal failure in the setting of CKD  Paroxysmal atrial fibrillation w/ acute RVR UTI  Dehydration  High output ileostomy - CHRONIC  Metastatic adenocarcinoma lung / Antineoplastic chemotherapy induced pancytopenia  Chronic systolic CHF (congestive heart failure)  Essential hypertension, Anemia of chronic disease   Initial presentation: 79 year old male with multiple medical problems. Most recently has been undergoing chemotherapy under the direction of Dr. Myna Hidalgo for lung CA. He has a chronic high output ileostomy syndrome which he manages quite nicely. He was recently hospitalized due to uncontrolled epistaxis related to chemotherapy induced thrombocytopenia.  On 10/22/2013 he followed up with his Oncologist and  laboratory data revealed acute on chronic renal failure with hyperkalemia and creatinine 5.9 up from a baseline of 1.8. He was subsequently transferred to [facility] and directly admitted to the step down unit. An attempt was made to place a Foley catheter but was unsuccessful. A urinalysis was obtained and this appeared consistent with a urinary tract infection. According to the patient he had generalized malaise for several days with poor appetite and decreased oral intake but his sx had worsened with his most recent chemotherapy treatment on 10/01/2013. Patient also endorsed increased weakness and lightheadedness with mobilization especially when standing upright.   Hospital Course:  Acute renal failure in the setting of CKD  -baseline of 1.8  -cr slowly improved with hydration  -renal US ruled out hydronephrosis/obstruction -Dr. Myna Hidalgo reported that current chemo not nephrotoxic - Nephrology evaluated but no intervention or further testing was required   Paroxysmal atrial fibrillation  -rate initially not controlled - likely due to [facility] -Amiodarone IV initially utilized, with change to oral dosing as suggested by Cards prior to d/c home  -Cardiology to f/u as outpt to determine timing of dosage change  -anticoag is currently on hold due to recent issues w/ bleeding (epistaxis) and chemo induced thrombobytopenia  UTI  -cx no growth  -UA highly suggestive of UTI - tx w/ empiric abx in setting of symptoms suggestive of UTI for an empiric 10 day course   Dehydration  -due to poor PO intake recently in setting of daily high ostomy output - corrected with IVF  High output ileostomy - CHRONIC  -allow for unlimited intake of fluids including Gatorade   Metastatic adenocarcinoma lung / Antineoplastic  chemotherapy induced pancytopenia  -Ennever following as outpt  -labs stable   Chronic systolic CHF (congestive heart failure)  -compensated  -cont coreg   Essential hypertension,  benign  -BP soft so Imdur dc'd 10/30 and Cards dc'd Coreg 10/31   Anemia of chronic disease  -Hgb drifted down almost 2 gms after hydration so gave 1 unit PRBCs in setting of known CAD - Hgb stable at time of d/c w/ no evidence of active blood loss    Medication List    STOP taking these medications       carvedilol 6.25 MG tablet  Commonly known as:  COREG      TAKE these medications       allopurinol 100 MG tablet  Commonly known as:  ZYLOPRIM  Take 100 mg by mouth at bedtime.     amiodarone 400 MG tablet  Commonly known as:  PACERONE  Take 1 tablet (400 mg total) by mouth 2 (two) times daily.     atorvastatin 40 MG tablet  Commonly known as:  LIPITOR  Take 20 mg by mouth every evening. 1/2 tablet daily     cefUROXime 500 MG tablet  Commonly known as:  CEFTIN  Take 1 tablet (500 mg total) by mouth 2 (two) times daily.  Start taking on:  10/27/2013     cholecalciferol 1000 UNITS tablet  Commonly known as:  VITAMIN D  Take 1,000 Units by mouth every morning.     ferrous sulfate 325 (65 FE) MG tablet  Take 325 mg by mouth every evening.     fish oil-omega-3 fatty acids 1000 MG capsule  Take 1 g by mouth 2 (two) times daily.     FLAXSEED (LINSEED) PO  Take 1 tablet by mouth at bedtime.     folic acid 400 MCG tablet  Commonly known as:  FOLVITE  Take 400 mcg by mouth every morning.     isosorbide mononitrate 60 MG 24 hr tablet  Commonly known as:  IMDUR  Take 30 mg by mouth every evening.     nitroGLYCERIN 0.4 MG SL tablet  Commonly known as:  NITROSTAT  Place 1 tablet (0.4 mg total) under the tongue every 5 (five) minutes as needed for chest pain.     potassium chloride SA 20 MEQ tablet  Commonly known as:  K-DUR,KLOR-CON  Take 10 mEq by mouth 2 (two) times daily.     tamsulosin 0.4 MG Caps capsule  Commonly known as:  FLOMAX  Take 0.4 mg by mouth daily after supper.       Day of Discharge BP 97/37  Pulse 99  Temp(Src) 98.5 F (36.9 C) (Oral)   Resp 25  Ht 5\' 4"  (1.626 m)  Wt 55.566 kg (122 lb 8 oz)  BMI 21.02 kg/m2  SpO2 96%  Physical Exam: General: No acute respiratory distress Lungs: Clear to auscultation bilaterally without wheezes or crackles Cardiovascular: Regular rate  without murmur gallop or rub normal S1 and S2 Abdomen: Nontender, nondistended, soft, bowel sounds positive, no rebound, no ascites, no appreciable mass Extremities: No significant cyanosis, clubbing, or edema bilateral lower extremities  Results for orders placed during the hospital encounter of 10/22/13 (from the past 24 hour(s))  CBC     Status: Abnormal   Collection Time    10/26/13  3:22 AM      Result Value Range   WBC 6.8  4.0 - 10.5 K/uL   RBC 2.64 (*) 4.22 - 5.81 MIL/uL   Hemoglobin  8.3 (*) 13.0 - 17.0 g/dL   HCT 16.1 (*) 09.6 - 04.5 %   MCV 89.8  78.0 - 100.0 fL   MCH 31.4  26.0 - 34.0 pg   MCHC 35.0  30.0 - 36.0 g/dL   RDW 40.9 (*) 81.1 - 91.4 %   Platelets 60 (*) 150 - 400 K/uL  BASIC METABOLIC PANEL     Status: Abnormal   Collection Time    10/26/13  3:22 AM      Result Value Range   Sodium 133 (*) 135 - 145 mEq/L   Potassium 3.3 (*) 3.5 - 5.1 mEq/L   Chloride 103  96 - 112 mEq/L   CO2 19  19 - 32 mEq/L   Glucose, Bld 77  70 - 99 mg/dL   BUN 28 (*) 6 - 23 mg/dL   Creatinine, Ser 7.82 (*) 0.50 - 1.35 mg/dL   Calcium 7.5 (*) 8.4 - 10.5 mg/dL   GFR calc non Af Amer 25 (*) >90 mL/min   GFR calc Af Amer 29 (*) >90 mL/min    Time spent in discharge (includes decision making & examination of pt): >30 minutes  10/26/2013, 10:20 AM   Lonia Blood, MD [facility] Office  [phone] Pager [phone]  On-Call/Text Page:      [email or web address]      password [facility]

## 2013-10-26 NOTE — Progress Notes (Signed)
Patient ID: [name] [name], male   DOB: 06/10/1930, 79 y.o.   MRN: [id number]    SUBJECTIVE:  Atrial fib continues. The patient was changed to oral amiodarone yesterday.   Filed Vitals:   10/25/13 1917 10/26/13 0024 10/26/13 0321 10/26/13 0757  BP: 111/70 104/62 108/48 97/37  Pulse: 110 111 104 99  Temp: 98.1 F (36.7 C) 98.4 F (36.9 C) 98.3 F (36.8 C) 98.5 F (36.9 C)  TempSrc: Oral Oral Oral Oral  Resp: 17 17 16 25   Height:      Weight:      SpO2: 93% 94% 95% 96%     Intake/Output Summary (Last 24 hours) at 10/26/13 0943 Last data filed at 10/26/13 0800  Gross per 24 hour  Intake 2596.7 ml  Output   2000 ml  Net  596.7 ml    LABS: Basic Metabolic Panel:  Recent Labs  19/14/78 0540 10/25/13 0456 10/26/13 0322  NA 136 138 133*  K 3.9 3.6 3.3*  CL 101 107 103  CO2 22 20 19   GLUCOSE 90 78 77  BUN 48* 35* 28*  CREATININE 4.02* 2.84* 2.31*  CALCIUM 7.4* 7.4* 7.5*  PHOS 4.4  --   --    Liver Function Tests:  Recent Labs  10/24/13 0540  ALBUMIN 1.8*   No results found for this basename: LIPASE, AMYLASE,  in the last 72 hours CBC:  Recent Labs  10/25/13 0456 10/26/13 0322  WBC 6.5 6.8  HGB 8.4* 8.3*  HCT 24.9* 23.7*  MCV 90.2 89.8  PLT 49* 60*   Cardiac Enzymes:  Recent Labs  10/23/13 1025 10/23/13 1700  TROPONINI <0.30 <0.30   BNP: No components found with this basename: POCBNP,  D-Dimer: No results found for this basename: DDIMER,  in the last 72 hours Hemoglobin A1C: No results found for this basename: HGBA1C,  in the last 72 hours Fasting Lipid Panel: No results found for this basename: CHOL, HDL, LDLCALC, TRIG, CHOLHDL, LDLDIRECT,  in the last 72 hours Thyroid Function Tests: No results found for this basename: TSH, T4TOTAL, FREET3, T3FREE, THYROIDAB,  in the last 72 hours  RADIOLOGY: US Renal  10/23/2013   CLINICAL DATA:  Acute renal failure  EXAM: RENAL/URINARY TRACT ULTRASOUND COMPLETE  COMPARISON:  Abdominal CT 04/30/2013   FINDINGS: Right Kidney  Length: 10 cm in length, although there is diffuse cortical thinning (approximately 9 mm in the interpolar region). No hydronephrosis. There are innumerable cysts, the largest measured is in the upper pole at 4 cm, although comparison CT shows a large lesion from the lower pole measuring 6 cm. Increased renal echogenicity consistent with medical renal disease. Previously seen right-sided stones not evident currently.  Left Kidney  Length: 11 cm in length but diffuse cortical thinning. No hydronephrosis. There are numerous hilar echogenic foci, best seen in the interpolar and lower pole regions, consistent with stones. Multiple cysts, the largest in the lower pole at 5 cm.  Bladder  Appears normal for distension.  94 cc volume.  IMPRESSION: 1. No hydronephrosis. 2. Polycystic kidneys and medical renal disease/atrophy. 3. Left nephrolithiasis.   Electronically Signed   By: Tiburcio Pea M.D.   On: 10/23/2013 00:57   Dg Chest Port 1 View  10/22/2013   CLINICAL DATA:  Renal failure.  Lung cancer.  EXAM: PORTABLE CHEST - 1 VIEW  COMPARISON:  Chest x-ray 10/12/2012  FINDINGS: Left Port-A-Cath is in place with the tip in the SVC near the cavoatrial junction. Prior median sternotomy. Heart  is normal size. No focal airspace opacities or visible effusion. No acute bony abnormality.  IMPRESSION: No active disease.   Electronically Signed   By: Charlett Nose M.D.   On: 10/22/2013 16:28    PHYSICAL EXAM  patient is stable today. I spoke with his wife in the room.   TELEMETRY: I reviewed telemetry today October 26, 2013. There is atrial fibrillation with a rate of 90.   ASSESSMENT AND PLAN:    Paroxysmal atrial fibrillation    The plan is to continue loading with oral amiodarone.   [name] 10/26/2013 9:43 AM

## 2013-10-26 NOTE — Progress Notes (Signed)
Acute Kidney Injury due to dehydration and possble chemo  Metastatic Adenoca  Anemia  A Fib on Amio   Plan: Agree with discharge and f/u with Dr. Kathrene Bongo.  Encouraged to maintain his hydration given his ileostomy output and he is well aware of this problem   Subjective: Interval History: Better  Objective: Vital signs in last 24 hours: Temp:  [97.9 F (36.6 C)-98.5 F (36.9 C)] 98.5 F (36.9 C) (11/02 0757) Pulse Rate:  [88-111] 99 (11/02 0757) Resp:  [11-25] 25 (11/02 0757) BP: (97-112)/(37-70) 97/37 mmHg (11/02 0757) SpO2:  [93 %-100 %] 96 % (11/02 0757) Weight change:   Intake/Output from previous day: 11/01 0701 - 11/02 0700 In: 2886.7 [P.O.:720; I.V.:2166.7] Out: 1600 [Urine:1250; Stool:350] Intake/Output this shift: Total I/O In: 100 [I.V.:100] Out: 600 [Urine:250; Stool:350]  Extremities: extremities normal, atraumatic, no cyanosis or edema  Lab Results:  Recent Labs  10/25/13 0456 10/26/13 0322  WBC 6.5 6.8  HGB 8.4* 8.3*  HCT 24.9* 23.7*  PLT 49* 60*   BMET:  Recent Labs  10/25/13 0456 10/26/13 0322  NA 138 133*  K 3.6 3.3*  CL 107 103  CO2 20 19  GLUCOSE 78 77  BUN 35* 28*  CREATININE 2.84* 2.31*  CALCIUM 7.4* 7.5*   No results found for this basename: PTH,  in the last 72 hours Iron Studies: No results found for this basename: IRON, TIBC, TRANSFERRIN, FERRITIN,  in the last 72 hours Studies/Results: No results found.  Scheduled: . amiodarone  400 mg Oral BID  . atorvastatin  20 mg Oral QPM  . cefTRIAXone (ROCEPHIN)  IV  1 g Intravenous Q24H  . docusate sodium  100 mg Oral BID  . feeding supplement (ENSURE COMPLETE)  237 mL Oral TID BM  . ferrous sulfate  325 mg Oral QPM  . isosorbide mononitrate  30 mg Oral QPM  . potassium chloride  40 mEq Oral Once  . sodium chloride  3 mL Intravenous Q12H  . tamsulosin  0.4 mg Oral QPC supper     LOS: 4 days   [name] [name] C 10/26/2013,10:28 AM

## 2013-10-28 ENCOUNTER — Ambulatory Visit (INDEPENDENT_AMBULATORY_CARE_PROVIDER_SITE_OTHER): Payer: Medicare Other | Admitting: Cardiology

## 2013-10-28 ENCOUNTER — Encounter: Payer: Self-pay | Admitting: Cardiology

## 2013-10-28 ENCOUNTER — Telehealth: Payer: Self-pay | Admitting: Cardiology

## 2013-10-28 VITALS — BP 116/75 | HR 115 | Ht 60.0 in | Wt 145.0 lb

## 2013-10-28 DIAGNOSIS — E785 Hyperlipidemia, unspecified: Secondary | ICD-10-CM

## 2013-10-28 DIAGNOSIS — I2510 Atherosclerotic heart disease of native coronary artery without angina pectoris: Secondary | ICD-10-CM

## 2013-10-28 DIAGNOSIS — I5022 Chronic systolic (congestive) heart failure: Secondary | ICD-10-CM

## 2013-10-28 DIAGNOSIS — I4891 Unspecified atrial fibrillation: Secondary | ICD-10-CM

## 2013-10-28 DIAGNOSIS — I059 Rheumatic mitral valve disease, unspecified: Secondary | ICD-10-CM

## 2013-10-28 DIAGNOSIS — I480 Paroxysmal atrial fibrillation: Secondary | ICD-10-CM

## 2013-10-28 DIAGNOSIS — N189 Chronic kidney disease, unspecified: Secondary | ICD-10-CM

## 2013-10-28 DIAGNOSIS — I509 Heart failure, unspecified: Secondary | ICD-10-CM

## 2013-10-28 DIAGNOSIS — I340 Nonrheumatic mitral (valve) insufficiency: Secondary | ICD-10-CM

## 2013-10-28 NOTE — Telephone Encounter (Signed)
New message  Patient was seen this morning and he was given a packet of paper and he is confused on the dates, He would like for you to give him a call. Please Advise!

## 2013-10-28 NOTE — Patient Instructions (Signed)
Start metoprolol succinate (Toprol XL) 25mg  two times a day.  Decrease amidodarone to 200mg  two times a day.    Your physician recommends that you schedule a follow-up appointment in: 1 week with Dr [name].   Your physician recommends that you return for lab work in: 1 week when you see Dr [name].--BMET/CBCD

## 2013-10-28 NOTE — Progress Notes (Signed)
Patient ID: [name] [name], male   DOB: 1930/03/08, 79 y.o.   MRN: [id number] PCP: Dr. Linford Arnold  79 yo with history of CAD s/p CABG, ulcerative colitis s/p colectomy, and CHF presents for cardiology followup.  He had CABG in 10/11 complicated by post-operative atrial fibrillation.  He was started on amiodarone while in the hospital after CABG and this was continued due to risk of further atrial fibrillation in the setting of significantly enlarged left atrium.  He was admitted to the hospital in [place] in 7/12 with a diastolic CHF exacerbation and was diuresed.  I had him stop amiodarone in 2012 given no atrial fibrillation recurrence.    In 7/13, he began to note increased exertional dyspnea.  He went to the hospital in [place] in 7/13 with a diastolic CHF exacerbation.  I am not sure if he was in atrial fibrillation at that time as I have no records of that hospitalization.  It sounds like he was only in the hospital for 2-3 days.  He had an echo done there with EF 35-40%, severe MR, PA systolic pressure 50-60 mmHg.   At a prior appointment, [name] [name] was in atrial fibrillation with RVR.  I started him on coumadin (NOACs not covered by his insurance) and Toprol XL.  In 9/13, I sent him for TEE-guided cardioversion on amiodarone (INR did not stay therapeutic throught the pre-DCCV period so needed TEE).  He converted to NSR.  I did admit him after cardioversion for diuresis.  TEE prior to DCCV confirmed EF 35-40% with severe, post-infarction [name] in the setting of severe hypokinesis to akinesis of the inferior and posterior walls. Repeat echo in [place] in 11/13 showed EF 45-50% with severe [name].  Most recent echo in 5/14 showed EF 45-50% with only mild [name].   [name] [name] developed a right supraclavicular solid neck mass that may be lung adenocarcinoma primary, though no obvious primary lung site was noted on imaging.  He has had radiation directed to the supraclavicular mass and had recent treatment with  carboplatin and Alimta.  He was admitted in 10/14 after with dehydration and AKI (creatinine to 5.9) post-chemotherapy.  He had no appetite and very poor po intake.  In this setting, he went back into atrial fibrillation with RVR.  BP was soft and amiodarone was increased to control rate.  He had been taken off coumadin with low platelets (down to 9,000) and epistaxis.  Most recent creatinine is down to 2.3.  He is back at home with his wife.  HR today is in the 110s, atrial fibrillation.  Platelets are up to 60,000.  He has been walking with a walker due to unsteadiness.  He feels weak overall but denies dyspnea walking around his house.  No chest pain. He is currently off lasix.  Appetite is still poor but he has been trying to drink Boost and Ensure.   Labs (9/12): LDL 81, HDL 54, K 4.2, creatinine 1.9, BNP 1065 Labs (11/12): K 4.1, creatinine 1.7, BNP 837 Labs (1/13): K 3.7, creatinine 1.6, HDL 52, LDL 100 Labs (5/13): K 4.4, creatinine 1.5, HDL 47, LDL 76 Labs (7/13): creatinine 2.0 Labs (8/13): K 3.9, creatinine 1.9, BNP 1066 Labs (9/13): K 3.5, creatinine 2.4=>2.3, BNP 971, LFTs normal, TSH normal Labs (10/13): K 4.6, creatinine 1.87 Labs (11/13): K 4.5, creatinine 2.4, LDL 73, HDL 50 Labs (5/14): K 4.3, creatinine 1.95, LFTs normal, HCT 33.4 Labs (10/14): LFTs normal, TSH normal Labs (11/14): K 3.3, creatinine 5.9 =>  2.3, HCT 23.7, plts 9 => 60  ECG: atrial fibrillation with rate 115, RBBB, old inferior MI.   PMH: 1. Paroxysmal atrial fibrillation: Noted only post-op CABG in 10/11.  Was not put on coumadin.  Initially on amiodarone but this was stopped. Back in atrial fibrillation in 8/13, amiodarone restarted along with coumadin.  TEE-guided DCCV in 9/13.  Back in atrial fibrillation in 10/14 with coumadin held because of decreased plts.  2. Knee osteoarthritis 3. Anemia of uncertain etiology: had erythropoietin shots for a period of time.  4. CAD: MI at age 61.  Had CABG in 10/11 with  LIMA-LAD, SVG-D, and SVG-RCA.  Lexiscan myoview (9/13) with EF 34%, large inferior infarct from base to apex with no ischemia.   5. Systolic CHF: Echo (1/12) with EF 50%, inferior and inferoseptal hypokinesis, mild LV hypertrophy, moderate [name], severe left atrial enlargement.  Echo [facility] hospital, 7/13): EF 35-40%, severe [name], moderate-severe LAE, PASP 50-60 mmHg.  TEE (9/13): EF 35-40%, posterior and inferior severe HK to AK, severe post-infarction [name], mildly decreased RV systolic function. Echo (11/13) with EF 45-50%, LV dilation, severe [name], mild RV dilation with moderate RV dysfunction.  6. Ulcerative colitis: s/p total colectomy with ileostomy.  7. HTN 8. Hyperlipidemia 9. Nephrolithiasis 10. CKD: H/o ARF/hyperkalemia in the setting of dehydration.  He is off ACEI and spironolactone.  11. [name]: Severe on 7/13 echo and 9/13 TEE.  Suspect post-infarction [name] in the setting of inferoposterior wall motion abnormality.  12. Gout 13. Metastatic lung adenocarcinoma with right supraclavicular mass diagnosed in 2014.  Patient had caroplatin/Alimta chemotherapy and tolerated poorly.   SH: Retired from [employer].  Married, lives in [place]. Stopped smoking in 1970.    FH: Strong history of CAD.  Father with MI at 59.  All siblings (11) have now passed away, many with heart disease.   ROS: All systems reviewed and negative except as per HPI.   Current Outpatient Prescriptions  Medication Sig Dispense Refill  . allopurinol (ZYLOPRIM) 100 MG tablet Take 100 mg by mouth at bedtime.       [name] atorvastatin (LIPITOR) 40 MG tablet Take 20 mg by mouth every evening. 1/2 tablet daily      . cefUROXime (CEFTIN) 500 MG tablet Take 1 tablet (500 mg total) by mouth 2 (two) times daily.  10 tablet  0  . cholecalciferol (VITAMIN D) 1000 UNITS tablet Take 1,000 Units by mouth every morning.       . ferrous sulfate 325 (65 FE) MG tablet Take 325 mg by mouth every evening.      . fish oil-omega-3 fatty acids 1000 MG  capsule Take 1 g by mouth 2 (two) times daily.       [name] FLAXSEED, LINSEED, PO Take 1 tablet by mouth at bedtime.       . folic acid (FOLVITE) 400 MCG tablet Take 400 mcg by mouth every morning.       . isosorbide mononitrate (IMDUR) 60 MG 24 hr tablet Take 30 mg by mouth every evening.       . nitroGLYCERIN (NITROSTAT) 0.4 MG SL tablet Place 1 tablet (0.4 mg total) under the tongue every 5 (five) minutes as needed for chest pain.  30 tablet  0  . potassium chloride SA (K-DUR,KLOR-CON) 20 MEQ tablet Take 10 mEq by mouth 2 (two) times daily.       . Tamsulosin HCl (FLOMAX) 0.4 MG CAPS Take 0.4 mg by mouth daily after supper.       [name]  amiodarone (PACERONE) 200 MG tablet Take 1 tablet (200 mg total) by mouth 2 (two) times daily.  60 tablet  6  . metoprolol succinate (TOPROL XL) 25 MG 24 hr tablet Take 1 tablet (25 mg total) by mouth 2 (two) times daily.  60 tablet  6  . [DISCONTINUED] pantoprazole (PROTONIX) 40 MG tablet Take 40 mg by mouth daily.         No current facility-administered medications for this visit.   Facility-Administered Medications Ordered in Other Visits  Medication Dose Route Frequency Provider Last Rate Last Dose  . heparin lock flush 100 unit/mL  250 Units Intracatheter PRN Josph Macho, MD      . sodium chloride 0.9 % injection 10 mL  10 mL Intracatheter PRN Josph Macho, MD        BP 116/75  Pulse 115  Ht 5' (1.524 m)  Wt 65.772 kg (145 lb)  BMI 28.32 kg/m2 General: NAD Neck: JVP not elevated, no thyromegaly or thyroid nodule.  Lungs: Clear to auscultation bilaterally with normal respiratory effort. CV: Nondisplaced PMI.  Heart tachy, irregular S1/S2, no S3/S4, 1/6 HSM at apex. 1+ edema 1/3 to knees bilaterally.  No carotid bruit.  Abdomen: Soft, nontender, no hepatosplenomegaly, no distention.  Neurologic: Alert and oriented x 3.  Psych: Normal affect. Extremities: No clubbing or cyanosis.  Skin: Solid mass at right supraclavicular  area  Assessment/Plan  1. Atrial fibrillation  Patient went back into atrial fibrillation with RVR with recent episode of dehydration/AKI.  He is in atrial fibrillation with RVR today.  BP has been soft but is ok today.  He is currently on amiodarone 400 mg bid.  He is off coumadin due to thrombocytopenia associated with chemotherapy.  - Decrease amiodarone to 200 mg bid.  Recent LFTs and TSH were normal. - Add Toprol XL 25 mg bid.  - Platelets are increasing, most recently 60,000.  I will repeat CBC next week. If they continue to rise, I will restart coumadin.  If HR remains difficult to control, I can then arrange for TEE-guided DCCV.  2. Chronic systolic CHF  EF 82-95% on most recent echo, suspect ischemic cardiomyopathy plays major role. NYHA class II.   - He is off spironolactone and ACEI after ARF with hyperkalemia and given baseline significantly CKD.   - Hydralazine/Imdur and Coreg were held with low blood pressure during recent hospitalization.    - Lasix was held with recent AKI.  I will keep him off Lasix for now given continued poor appetite.  Will reassess volume status at next appointment in 1 week.  - As above, will start Toprol XL 25 mg bid.   3. HYPERLIPIDEMIA  Continue atorvastatin 20 with known CAD.   4. Mitral regurgitation  This appeared to be post-infarction [name] with inferoposterior severe hypokinesis to akinesis.  Most recent echo was read as showing mild [name].  I will need to review this for accuracy as prior studies showed severe [name].  5. CAD  Status post CABG.  Last [facility] showed dense inferior scar with no ischemia.  Continue medical management.  He is off ASA and warfarin with thrombocytopenia.  Continue statin. 6. CKD  BMET at followup in 1 week.  Creatinine has been trending back to baseline.    [name] 10/28/2013

## 2013-10-28 NOTE — Telephone Encounter (Signed)
Spoke with patient.

## 2013-10-29 ENCOUNTER — Encounter: Payer: Self-pay | Admitting: Family Medicine

## 2013-10-29 ENCOUNTER — Encounter (HOSPITAL_COMMUNITY)
Admission: RE | Admit: 2013-10-29 | Discharge: 2013-10-29 | Disposition: A | Discharge status: Home / Self Care | Payer: Medicare Other | Source: Ambulatory Visit | Attending: Hematology & Oncology | Admitting: Hematology & Oncology

## 2013-10-29 ENCOUNTER — Ambulatory Visit (INDEPENDENT_AMBULATORY_CARE_PROVIDER_SITE_OTHER): Payer: Medicare Other | Admitting: Family Medicine

## 2013-10-29 ENCOUNTER — Encounter (HOSPITAL_COMMUNITY): Payer: Self-pay

## 2013-10-29 VITALS — BP 89/64 | HR 121 | Temp 97.5°F | Wt 144.0 lb

## 2013-10-29 DIAGNOSIS — I480 Paroxysmal atrial fibrillation: Secondary | ICD-10-CM

## 2013-10-29 DIAGNOSIS — N179 Acute kidney failure, unspecified: Secondary | ICD-10-CM

## 2013-10-29 DIAGNOSIS — C799 Secondary malignant neoplasm of unspecified site: Secondary | ICD-10-CM

## 2013-10-29 DIAGNOSIS — G4700 Insomnia, unspecified: Secondary | ICD-10-CM

## 2013-10-29 DIAGNOSIS — S39012A Strain of muscle, fascia and tendon of lower back, initial encounter: Secondary | ICD-10-CM

## 2013-10-29 DIAGNOSIS — S335XXA Sprain of ligaments of lumbar spine, initial encounter: Secondary | ICD-10-CM

## 2013-10-29 DIAGNOSIS — J069 Acute upper respiratory infection, unspecified: Secondary | ICD-10-CM

## 2013-10-29 DIAGNOSIS — I4891 Unspecified atrial fibrillation: Secondary | ICD-10-CM

## 2013-10-29 DIAGNOSIS — C801 Malignant (primary) neoplasm, unspecified: Secondary | ICD-10-CM | POA: Insufficient documentation

## 2013-10-29 MED ORDER — FLUDEOXYGLUCOSE F - 18 (FDG) INJECTION
15.1000 | Freq: Once | INTRAVENOUS | Status: AC | PRN
  Administered 2013-10-29: 15.1 via INTRAVENOUS

## 2013-10-29 MED ORDER — NITROGLYCERIN 0.4 MG SL SUBL: 0.4000 mg | SUBLINGUAL_TABLET | SUBLINGUAL | Status: AC | PRN

## 2013-10-29 NOTE — Progress Notes (Signed)
Subjective:    Patient ID: [name] [name], male    DOB: 09/21/1930, 79 y.o.   MRN: [id number]  HPI Hospitalized for ARF.  Cr was > 5.  Down to 2.3 on 11/2.  He is feeling somewhat better overall but still fatigued and feeling a little bit weak in his lower extremity Dr. his hospitalization. Lab Results  Component Value Date   CREATININE 2.31* 10/26/2013     low back pain started this AM. [place]st occasionally.  Using an arthritic cream on it and trying stretches.  Does help some.  No falls or injuries.  His head from side of his low back and says this feels similar. No radiation down his legs. He says it usually gets better on its own after couple of days.  Insomnia - never been a good sleeper.  Goes to bed about 10PM and up and down until about 4 AM. Once he finally falls asleep he does well. Occ takes Sleep-ez and helps some. Says similar to Unisom.    Dec appetie on the chemotherapy.  No vomiting.  Some heaving.  Does drink ensure and boost. Has pet scan schedule for latera today.    Runny nose for about 3-4 days.  Occ cough but no change. No fever.  Heard some wheezing last night.  On cefting currently for kidney infection.  His wife has had a cold as well. No change in sputum color. No fever, chills or sweats.  He wants and if it would be okay to use NyQuil.  Paroxysmal atrial fibrillation-not on Coumadin per cardiology. They did restart his metoprolol yesterday. He had an old bottle at home.  Review of Systems     Objective:   Physical Exam  Constitutional: He is oriented to person, place, and time. He appears well-developed and well-nourished.  HENT:  Head: Normocephalic and atraumatic.  Cardiovascular: Regular rhythm and normal heart sounds.   He is tachycardic today.  Pulmonary/Chest: Effort normal and breath sounds normal.  Musculoskeletal: He exhibits no edema.  Mass on the right lower side of the neck above the clavicle is definitely smaller than last saw him. Nontender over  the lumbar spine. She's mildly tender over the paraspinous muscles bilaterally. No SI joint tenderness. Hip, knee, ankle strength is 5 out of 5 bilaterally. Patellar reflexes are 1+ bilaterally. Negative straight leg raise bilaterally  Neurological: He is alert and oriented to person, place, and time.  Skin: Skin is warm and dry.  Psychiatric: He has a normal mood and affect. His behavior is normal.          Assessment & Plan:  ARF - kidneys are recovering. Has repeat in labs in one week with Dr. Myna Hidalgo.   Low back pain - conservative treatment with rub and heating pad. Work on gentle stretches. Can use Tylenol as needed. Recommend conservative therapy initially and call if she's not getting better feels that she's getting worse or noticing any pain radiating down the back of the legs.  Insomnia - discussed options. He has tried some over-the-counter options that were not helpful. We did discuss potentially using a prescription type medication that can help him relax enough to be a falsely. He says typically his mind starts racing and that makes it difficult to fall asleep. Once he is asleep he usually does well.  URI - I. do think he has a viral illness. He is currently undergoing chemotherapy which does put him at risk for immunosuppression. He is Re: on Ceftin for urinary  tract infection. If he is not feeling better in the next couple of days or if he spikes a fever or feels worse as far as his respiratory symptoms or concerns please call the office immediately.  Decreased appetite-secondary to chemotherapy. Encouraged him to keep up the insurance based products to help maintain adequate calories and protein for healing.  Paroxysmal atrial fibrillation-he did restart his metoprolol today. He is still tachycardic but just started it so we'll monitor next couple weeks.  His oncologist recommended avoiding the influenza vaccine for now.

## 2013-10-29 NOTE — Patient Instructions (Addendum)
Valerian root capsules for sleep.  Call if you back pain is not improving in a week.

## 2013-11-05 ENCOUNTER — Other Ambulatory Visit (HOSPITAL_BASED_OUTPATIENT_CLINIC_OR_DEPARTMENT_OTHER): Payer: Medicare Other | Admitting: Lab

## 2013-11-05 ENCOUNTER — Ambulatory Visit (HOSPITAL_BASED_OUTPATIENT_CLINIC_OR_DEPARTMENT_OTHER): Payer: Medicare Other

## 2013-11-05 ENCOUNTER — Ambulatory Visit (HOSPITAL_BASED_OUTPATIENT_CLINIC_OR_DEPARTMENT_OTHER): Payer: Medicare Other | Admitting: Hematology & Oncology

## 2013-11-05 ENCOUNTER — Ambulatory Visit (HOSPITAL_COMMUNITY)
Admission: RE | Admit: 2013-11-05 | Discharge: 2013-11-05 | Disposition: A | Discharge status: Home / Self Care | Payer: Medicare Other | Source: Ambulatory Visit | Attending: Hematology & Oncology | Admitting: Hematology & Oncology

## 2013-11-05 VITALS — BP 96/54 | HR 102 | Temp 97.8°F | Resp 16 | Ht 62.0 in | Wt 140.0 lb

## 2013-11-05 DIAGNOSIS — I4891 Unspecified atrial fibrillation: Secondary | ICD-10-CM | POA: Insufficient documentation

## 2013-11-05 DIAGNOSIS — R531 Weakness: Secondary | ICD-10-CM

## 2013-11-05 DIAGNOSIS — D631 Anemia in chronic kidney disease: Secondary | ICD-10-CM

## 2013-11-05 DIAGNOSIS — D649 Anemia, unspecified: Secondary | ICD-10-CM | POA: Insufficient documentation

## 2013-11-05 DIAGNOSIS — C3490 Malignant neoplasm of unspecified part of unspecified bronchus or lung: Secondary | ICD-10-CM | POA: Insufficient documentation

## 2013-11-05 DIAGNOSIS — C189 Malignant neoplasm of colon, unspecified: Secondary | ICD-10-CM

## 2013-11-05 DIAGNOSIS — I2510 Atherosclerotic heart disease of native coronary artery without angina pectoris: Secondary | ICD-10-CM

## 2013-11-05 DIAGNOSIS — C799 Secondary malignant neoplasm of unspecified site: Secondary | ICD-10-CM

## 2013-11-05 DIAGNOSIS — N189 Chronic kidney disease, unspecified: Secondary | ICD-10-CM

## 2013-11-05 DIAGNOSIS — Z7901 Long term (current) use of anticoagulants: Secondary | ICD-10-CM

## 2013-11-05 DIAGNOSIS — C801 Malignant (primary) neoplasm, unspecified: Secondary | ICD-10-CM | POA: Insufficient documentation

## 2013-11-05 DIAGNOSIS — N183 Chronic kidney disease, stage 3 unspecified: Secondary | ICD-10-CM | POA: Insufficient documentation

## 2013-11-05 DIAGNOSIS — I480 Paroxysmal atrial fibrillation: Secondary | ICD-10-CM

## 2013-11-05 DIAGNOSIS — I5022 Chronic systolic (congestive) heart failure: Secondary | ICD-10-CM

## 2013-11-05 LAB — PROTIME-INR (CHCC SATELLITE)
INR: 1.1 — ABNORMAL LOW (ref 2.0–3.5)
Protime: 13.2 Seconds (ref 10.6–13.4)

## 2013-11-05 LAB — CBC WITH DIFFERENTIAL (CANCER CENTER ONLY)
BASO#: 0 10*3/uL (ref 0.0–0.2)
Eosinophils Absolute: 0 10*3/uL (ref 0.0–0.5)
HCT: 23.2 % — ABNORMAL LOW (ref 38.7–49.9)
HGB: 7.4 g/dL — ABNORMAL LOW (ref 13.0–17.1)
LYMPH%: 6.4 % — ABNORMAL LOW (ref 14.0–48.0)
MCH: 31.5 pg (ref 28.0–33.4)
MCV: 99 fL — ABNORMAL HIGH (ref 82–98)
MONO#: 1.7 10*3/uL — ABNORMAL HIGH (ref 0.1–0.9)
MONO%: 19.1 % — ABNORMAL HIGH (ref 0.0–13.0)
NEUT#: 6.6 10*3/uL — ABNORMAL HIGH (ref 1.5–6.5)
NEUT%: 74 % (ref 40.0–80.0)
Platelets: 103 10*3/uL — ABNORMAL LOW (ref 145–400)
RBC: 2.35 10*6/uL — ABNORMAL LOW (ref 4.20–5.70)
WBC: 8.9 10*3/uL (ref 4.0–10.0)

## 2013-11-05 LAB — PREALBUMIN: Prealbumin: 13.9 mg/dL — ABNORMAL LOW (ref 17.0–34.0)

## 2013-11-05 LAB — COMPREHENSIVE METABOLIC PANEL
Alkaline Phosphatase: 74 U/L (ref 39–117)
BUN: 24 mg/dL — ABNORMAL HIGH (ref 6–23)
CO2: 15 mEq/L — ABNORMAL LOW (ref 19–32)
Creatinine, Ser: 2.35 mg/dL — ABNORMAL HIGH (ref 0.50–1.35)
Glucose, Bld: 85 mg/dL (ref 70–99)
Sodium: 138 mEq/L (ref 135–145)
Total Bilirubin: 0.5 mg/dL (ref 0.3–1.2)
Total Protein: 4.9 g/dL — ABNORMAL LOW (ref 6.0–8.3)

## 2013-11-05 LAB — TECHNOLOGIST REVIEW CHCC SATELLITE

## 2013-11-05 MED ORDER — HEPARIN SOD (PORK) LOCK FLUSH 100 UNIT/ML IV SOLN
500.0000 [IU] | Freq: Once | INTRAVENOUS | Status: AC
  Administered 2013-11-05: 500 [IU] via INTRAVENOUS
  Filled 2013-11-05: qty 5

## 2013-11-05 MED ORDER — SODIUM CHLORIDE 0.9 % IJ SOLN
10.0000 mL | INTRAMUSCULAR | Status: DC | PRN
  Administered 2013-11-05: 10 mL via INTRAVENOUS
  Filled 2013-11-05: qty 10

## 2013-11-05 MED ORDER — SODIUM CHLORIDE 0.9 % IJ SOLN
10.0000 mL | Freq: Once | INTRAMUSCULAR | Status: AC
  Administered 2013-11-05: 10 mL via INTRAVENOUS
  Filled 2013-11-05: qty 10

## 2013-11-05 NOTE — Progress Notes (Signed)
This office note has been dictated.

## 2013-11-05 NOTE — Patient Instructions (Signed)
Implanted Port Instructions  An implanted port is a central line that has a round shape and is placed under the skin. It is used for long-term IV (intravenous) access for:  · Medicine.  · Fluids.  · Liquid nutrition, such as TPN (total parenteral nutrition).  · Blood samples.  Ports can be placed:  · In the chest area just below the collarbone (this is the most common place.)  · In the arms.  · In the belly (abdomen) area.  · In the legs.  PARTS OF THE PORT  A port has 2 main parts:  · The reservoir. The reservoir is round, disc-shaped, and will be a small, raised area under your skin.  · The reservoir is the part where a needle is inserted (accessed) to either give medicines or to draw blood.  · The catheter. The catheter is a long, slender tube that extends from the reservoir. The catheter is placed into a large vein.  · Medicine that is inserted into the reservoir goes into the catheter and then into the vein.  INSERTION OF THE PORT  · The port is surgically placed in either an operating room or in a procedural area (interventional radiology).  · Medicine may be given to help you relax during the procedure.  · The skin where the port will be inserted is numbed (local anesthetic).  · 1 or 2 small cuts (incisions) will be made in the skin to insert the port.  · The port can be used after it has been inserted.  INCISION SITE CARE  · The incision site may have small adhesive strips on it. This helps keep the incision site closed. Sometimes, no adhesive strips are placed. Instead of adhesive strips, a special kind of surgical glue is used to keep the incision closed.  · If adhesive strips were placed on the incision sites, do not take them off. They will fall off on their own.  · The incision site may be sore for 1 to 2 days. Pain medicine can help.  · Do not get the incision site wet. Bathe or shower as directed by your caregiver.  · The incision site should heal in 5 to 7 days. A small scar may form after the  incision has healed.  ACCESSING THE PORT  Special steps must be taken to access the port:  · Before the port is accessed, a numbing cream can be placed on the skin. This helps numb the skin over the port site.  · A sterile technique is used to access the port.  · The port is accessed with a needle. Only "non-coring" port needles should be used to access the port. Once the port is accessed, a blood return should be checked. This helps ensure the port is in the vein and is not clogged (clotted).  · If your caregiver believes your port should remain accessed, a clear (transparent) bandage will be placed over the needle site. The bandage and needle will need to be changed every week or as directed by your caregiver.  · Keep the bandage covering the needle clean and dry. Do not get it wet. Follow your caregiver's instructions on how to take a shower or bath when the port is accessed.  · If your port does not need to stay accessed, no bandage is needed over the port.  FLUSHING THE PORT  Flushing the port keeps it from getting clogged. How often the port is flushed depends on:  · If a   constant infusion is running. If a constant infusion is running, the port may not need to be flushed.  · If intermittent medicines are given.  · If the port is not being used.  For intermittent medicines:  · The port will need to be flushed:  · After medicines have been given.  · After blood has been drawn.  · As part of routine maintenance.  · A port is normally flushed with:  · Normal saline.  · Heparin.  · Follow your caregiver's advice on how often, how much, and the type of flush to use on your port.  IMPORTANT PORT INFORMATION  · Tell your caregiver if you are allergic to heparin.  · After your port is placed, you will get a manufacturer's information card. The card has information about your port. Keep this card with you at all times.  · There are many types of ports available. Know what kind of port you have.  · In case of an  emergency, it may be helpful to wear a medical alert bracelet. This can help alert health care workers that you have a port.  · The port can stay in for as long as your caregiver believes it is necessary.  · When it is time for the port to come out, surgery will be done to remove it. The surgery will be similar to how the port was put in.  · If you are in the hospital or clinic:  · Your port will be taken care of and flushed by a nurse.  · If you are at home:  · A home health care nurse may give medicines and take care of the port.  · You or a family member can get special training and directions for giving medicine and taking care of the port at home.  SEEK IMMEDIATE MEDICAL CARE IF:   · Your port does not flush or you are unable to get a blood return.  · New drainage or pus is coming from the incision.  · A bad smell is coming from the incision site.  · You develop swelling or increased redness at the incision site.  · You develop increased swelling or pain at the port site.  · You develop swelling or pain in the surrounding skin near the port.  · You have an oral temperature above 102° F (38.9° C), not controlled by medicine.  MAKE SURE YOU:   · Understand these instructions.  · Will watch your condition.  · Will get help right away if you are not doing well or get worse.  Document Released: 12/11/2005 Document Revised: 03/04/2012 Document Reviewed: 03/04/2009  [employer]® Patient Information ©2014 [employer].

## 2013-11-05 NOTE — Progress Notes (Signed)
[name] [name] presented for Portacath access and flush. Proper placement of portacath confirmed by CXR. Portacath located in the left chest wall accessed with  H 20 needle. Clean, Dry and Intact Good blood return present. Portacath flushed with 20ml NS and 500U/77ml Heparin per protocol and needle removed intact. Procedure without incident. Patient tolerated procedure well.

## 2013-11-06 ENCOUNTER — Encounter: Payer: Self-pay | Admitting: Cardiology

## 2013-11-06 ENCOUNTER — Ambulatory Visit (INDEPENDENT_AMBULATORY_CARE_PROVIDER_SITE_OTHER): Payer: Medicare Other | Admitting: Cardiology

## 2013-11-06 VITALS — BP 110/60 | HR 100 | Ht 62.0 in | Wt 140.0 lb

## 2013-11-06 DIAGNOSIS — I340 Nonrheumatic mitral (valve) insufficiency: Secondary | ICD-10-CM

## 2013-11-06 DIAGNOSIS — I2510 Atherosclerotic heart disease of native coronary artery without angina pectoris: Secondary | ICD-10-CM

## 2013-11-06 DIAGNOSIS — I4891 Unspecified atrial fibrillation: Secondary | ICD-10-CM

## 2013-11-06 DIAGNOSIS — I5022 Chronic systolic (congestive) heart failure: Secondary | ICD-10-CM

## 2013-11-06 DIAGNOSIS — I480 Paroxysmal atrial fibrillation: Secondary | ICD-10-CM

## 2013-11-06 DIAGNOSIS — I509 Heart failure, unspecified: Secondary | ICD-10-CM

## 2013-11-06 DIAGNOSIS — I059 Rheumatic mitral valve disease, unspecified: Secondary | ICD-10-CM

## 2013-11-06 NOTE — Progress Notes (Signed)
Patient ID: [name] [name], male   DOB: 11-Feb-1930, 79 y.o.   MRN: [id number] PCP: Dr. Linford Arnold  79 y.o. with history of CAD s/p CABG, ulcerative colitis s/p colectomy, and CHF presents for cardiology followup.  He had CABG in 10/11 complicated by post-operative atrial fibrillation.  He was started on amiodarone while in the hospital after CABG and this was continued due to risk of further atrial fibrillation in the setting of significantly enlarged left atrium.  He was admitted to the hospital in [place] in 7/12 with a diastolic CHF exacerbation and was diuresed.  I had him stop amiodarone in 2012 given no atrial fibrillation recurrence.    In 7/13, he began to note increased exertional dyspnea.  He went to the hospital in [place] in 7/13 with a diastolic CHF exacerbation.  I am not sure if he was in atrial fibrillation at that time as I have no records of that hospitalization.  It sounds like he was only in the hospital for 2-3 days.  He had an echo done there with EF 35-40%, severe MR, PA systolic pressure 50-60 mmHg.   At a prior appointment, [name] [name] was in atrial fibrillation with RVR.  I started him on coumadin (NOACs not covered by his insurance) and Toprol XL.  In 9/13, I sent him for TEE-guided cardioversion on amiodarone (INR did not stay therapeutic throught the pre-DCCV period so needed TEE).  He converted to NSR.  I did admit him after cardioversion for diuresis.  TEE prior to DCCV confirmed EF 35-40% with severe, post-infarction [name] in the setting of severe hypokinesis to akinesis of the inferior and posterior walls. Repeat echo in [facility] in 11/13 showed EF 45-50% with severe [name].  Most recent echo in 5/14 showed EF 45-50% with only mild [name].   [name] [name] developed a right supraclavicular solid neck mass that may be lung adenocarcinoma primary, though no obvious primary lung site was noted on imaging.  He has had radiation directed to the supraclavicular mass and had recent treatment with  carboplatin and Alimta.  He was admitted in 10/14 after with dehydration and AKI (creatinine to 5.9) post-chemotherapy.  He had no appetite and very poor po intake.  In this setting, he went back into atrial fibrillation with RVR.  BP was soft and amiodarone was increased to control rate.  He had been taken off coumadin with low platelets (down to 9,000) and epistaxis.  Most recent creatinine was down to 2.3.  He is back at home with his wife.  HR today is in the 100s, atrial fibrillation.  At last appointment, I started Toprol XL.  Platelets are up to 103,000 but hemoglobin is still low.  He is going to get pRBCs transfused tomorrow. He has been walking with a walker due to unsteadiness.  He feels weak overall but denies dyspnea walking around his house.  No chest pain. He is currently off lasix.  Appetite gradually improving but he is down another 5 lbs .   Labs (9/12): LDL 81, HDL 54, K 4.2, creatinine 1.9, BNP 1065 Labs (11/12): K 4.1, creatinine 1.7, BNP 837 Labs (1/13): K 3.7, creatinine 1.6, HDL 52, LDL 100 Labs (5/13): K 4.4, creatinine 1.5, HDL 47, LDL 76 Labs (7/13): creatinine 2.0 Labs (8/13): K 3.9, creatinine 1.9, BNP 1066 Labs (9/13): K 3.5, creatinine 2.4=>2.3, BNP 971, LFTs normal, TSH normal Labs (10/13): K 4.6, creatinine 1.87 Labs (11/13): K 4.5, creatinine 2.4, LDL 73, HDL 50 Labs (5/14): K 4.3, creatinine  1.95, LFTs normal, HCT 33.4 Labs (10/14): LFTs normal, TSH normal Labs (11/14): K 3.3, creatinine 5.9 => 2.3, HCT 23.7=>23.2, plts 9 => 60=>103  ECG: atrial fibrillation with rate 107, RBBB, old inferior MI.   PMH: 1. Paroxysmal atrial fibrillation: Noted initially post-op CABG in 10/11. Back in atrial fibrillation in 8/13, amiodarone restarted along with coumadin.  TEE-guided DCCV in 9/13.  Back in atrial fibrillation in 10/14 with coumadin held because of decreased plts.  2. Knee osteoarthritis 3. Anemia of uncertain etiology: had erythropoietin shots for a period of time.   4. CAD: MI at age 79.  Had CABG in 10/11 with LIMA-LAD, SVG-D, and SVG-RCA.  Lexiscan myoview (9/13) with EF 34%, large inferior infarct from base to apex with no ischemia.   5. Systolic CHF: Echo (1/12) with EF 50%, inferior and inferoseptal hypokinesis, mild LV hypertrophy, moderate [name], severe left atrial enlargement.  Echo [facility] hospital, 7/13): EF 35-40%, severe [name], moderate-severe LAE, PASP 50-60 mmHg.  TEE (9/13): EF 35-40%, posterior and inferior severe HK to AK, severe post-infarction [name], mildly decreased RV systolic function. Echo (11/13) with EF 45-50%, LV dilation, severe [name], mild RV dilation with moderate RV dysfunction.  6. Ulcerative colitis: s/p total colectomy with ileostomy.  7. HTN 8. Hyperlipidemia 9. Nephrolithiasis 10. CKD: H/o ARF/hyperkalemia in the setting of dehydration.  He is off ACEI and spironolactone.  11. [name]: Severe on 7/13 echo and 9/13 TEE.  Suspect post-infarction [name] in the setting of inferoposterior wall motion abnormality.  12. Gout 13. Metastatic lung adenocarcinoma with right supraclavicular mass diagnosed in 2014.  Patient had caroplatin/Alimta chemotherapy and tolerated poorly.   SH: Retired from [employer].  Married, lives in [place]. Stopped smoking in 1970.    FH: Strong history of CAD.  Father with MI at 77.  All siblings (11) have now passed away, many with heart disease.   ROS: All systems reviewed and negative except as per HPI.   Current Outpatient Prescriptions  Medication Sig Dispense Refill  . allopurinol (ZYLOPRIM) 100 MG tablet Take 100 mg by mouth at bedtime.       [name] amiodarone (PACERONE) 200 MG tablet Take 1 tablet (200 mg total) by mouth 2 (two) times daily.  60 tablet  6  . atorvastatin (LIPITOR) 40 MG tablet Take 20 mg by mouth every evening. 1/2 tablet daily      . cholecalciferol (VITAMIN D) 1000 UNITS tablet Take 1,000 Units by mouth every morning.       . ferrous sulfate 325 (65 FE) MG tablet Take 325 mg by mouth every  evening.      . fish oil-omega-3 fatty acids 1000 MG capsule Take 1 g by mouth 2 (two) times daily.       [name] FLAXSEED, LINSEED, PO Take 1 tablet by mouth at bedtime.       . folic acid (FOLVITE) 400 MCG tablet Take 400 mcg by mouth every morning.       . isosorbide mononitrate (IMDUR) 60 MG 24 hr tablet Take 30 mg by mouth every evening.       . nitroGLYCERIN (NITROSTAT) 0.4 MG SL tablet Place 1 tablet (0.4 mg total) under the tongue every 5 (five) minutes as needed for chest pain.  10 tablet  0  . potassium chloride SA (K-DUR,KLOR-CON) 20 MEQ tablet Take 10 mEq by mouth 2 (two) times daily.       . Tamsulosin HCl (FLOMAX) 0.4 MG CAPS Take 0.4 mg by mouth daily after supper.       [name]  metoprolol succinate (TOPROL-XL) 50 MG 24 hr tablet Take 1 tablet (50 mg total) by mouth 2 (two) times daily. Take with or immediately following a meal.  60 tablet  3  . [DISCONTINUED] pantoprazole (PROTONIX) 40 MG tablet Take 40 mg by mouth daily.         No current facility-administered medications for this visit.   Facility-Administered Medications Ordered in Other Visits  Medication Dose Route Frequency Provider Last Rate Last Dose  . heparin lock flush 100 unit/mL  250 Units Intracatheter PRN Josph Macho, MD      . sodium chloride 0.9 % injection 10 mL  10 mL Intracatheter PRN Josph Macho, MD        BP 110/60  Pulse 100  Ht 5\' 2"  (1.575 m)  Wt 140 lb (63.504 kg)  BMI 25.60 kg/m2 General: NAD Neck: JVP not elevated, no thyromegaly or thyroid nodule.  Lungs: Clear to auscultation bilaterally with normal respiratory effort. CV: Nondisplaced PMI.  Heart tachy, irregular S1/S2, no S3/S4, 1/6 HSM at apex. Trace ankle edema.  No carotid bruit.  Abdomen: Soft, nontender, no hepatosplenomegaly, no distention.  Neurologic: Alert and oriented x 3.  Psych: Normal affect. Extremities: No clubbing or cyanosis.  Skin: Solid mass at right supraclavicular area  Assessment/Plan  1. Atrial fibrillation   Patient went back into atrial fibrillation with RVR with recent episode of dehydration/AKI.  He is in atrial fibrillation with RVR today.  He is off coumadin due to thrombocytopenia and anemia associated with chemotherapy.  - Continue amiodarone at 200 mg bid for now.  Recent LFTs and TSH were normal. - Increase Toprol XL to 50 mg bid.  - Platelets are increasing, most recently 103,000.  However, hemoglobin is still low and he is going to be transfused tomorrow.  I would like to restart his coumadin but will wait until anemia stabilizes.  At that point, he could undergo TEE-guided DCCV if we still are having trouble controlling his rate.  Will get CBC in 2 weeks.  2. Chronic systolic CHF  EF 16-10% on most recent echo, suspect ischemic cardiomyopathy plays major role. NYHA class II.   - He is off spironolactone and ACEI after ARF with hyperkalemia and given baseline significantly CKD.   - Hydralazine/Imdur and Coreg were held with low blood pressure during recent hospitalization.    - Lasix was held with recent AKI.  I will keep him off Lasix for now given continued poor appetite.   - As above, now on Toprol XL.   3. HYPERLIPIDEMIA  Continue atorvastatin with known CAD.   4. Mitral regurgitation  This appeared to be post-infarction [name] with inferoposterior severe hypokinesis to akinesis.  Most recent echo was read as showing mild [name].   5. CAD  Status post CABG.  Last [facility] showed dense inferior scar with no ischemia.  Continue medical management.  He is off ASA and warfarin with thrombocytopenia.  Continue statin.  As above, would like to eventually restart warfarin.  6. CKD  BMET with CBC in 2 wks.   [name] 11/06/2013

## 2013-11-06 NOTE — Patient Instructions (Addendum)
Increase Toprol XL to 50mg  two times a day. You can take 2 of your 25mg  tablets two times a day.  Your physician recommends that you return for lab work in: 2 weeks--BMET/CBCd. I have given you an order for this. Please fax the results to Dr [name] [phone]   Your physician recommends that you schedule a follow-up appointment in: 3 weeks with Dr [name].

## 2013-11-07 ENCOUNTER — Encounter: Payer: Self-pay | Admitting: Hematology & Oncology

## 2013-11-07 ENCOUNTER — Ambulatory Visit (HOSPITAL_BASED_OUTPATIENT_CLINIC_OR_DEPARTMENT_OTHER): Payer: Medicare Other

## 2013-11-07 VITALS — BP 110/60 | HR 62 | Temp 97.0°F | Resp 20

## 2013-11-07 DIAGNOSIS — D631 Anemia in chronic kidney disease: Secondary | ICD-10-CM

## 2013-11-07 DIAGNOSIS — N189 Chronic kidney disease, unspecified: Secondary | ICD-10-CM

## 2013-11-07 LAB — PREPARE RBC (CROSSMATCH)

## 2013-11-07 MED ORDER — FUROSEMIDE 10 MG/ML IJ SOLN
INTRAMUSCULAR | Status: AC
  Filled 2013-11-07: qty 4

## 2013-11-07 MED ORDER — ACETAMINOPHEN 325 MG PO TABS
ORAL_TABLET | ORAL | Status: AC
  Filled 2013-11-07: qty 2

## 2013-11-07 MED ORDER — HEPARIN SOD (PORK) LOCK FLUSH 100 UNIT/ML IV SOLN
500.0000 [IU] | Freq: Every day | INTRAVENOUS | Status: AC | PRN
  Administered 2013-11-07: 500 [IU]
  Filled 2013-11-07: qty 5

## 2013-11-07 MED ORDER — SODIUM CHLORIDE 0.9 % IV SOLN
250.0000 mL | Freq: Once | INTRAVENOUS | Status: AC
  Administered 2013-11-07: 250 mL via INTRAVENOUS

## 2013-11-07 MED ORDER — SODIUM CHLORIDE 0.9 % IJ SOLN
10.0000 mL | INTRAMUSCULAR | Status: AC | PRN
  Administered 2013-11-07: 10 mL
  Filled 2013-11-07: qty 10

## 2013-11-07 MED ORDER — ACETAMINOPHEN 325 MG PO TABS
650.0000 mg | ORAL_TABLET | Freq: Once | ORAL | Status: AC
  Administered 2013-11-07: 650 mg via ORAL

## 2013-11-07 MED ORDER — FUROSEMIDE 10 MG/ML IJ SOLN
10.0000 mg | Freq: Once | INTRAMUSCULAR | Status: AC
  Administered 2013-11-07: 10 mg via INTRAVENOUS

## 2013-11-07 NOTE — Patient Instructions (Signed)
Blood Transfusion  A blood transfusion replaces your blood or some of its parts. Blood is replaced when you have lost blood because of surgery, an accident, or for severe blood conditions like anemia. You can donate blood to be used on yourself if you have a planned surgery. If you lose blood during that surgery, your own blood can be given back to you. Any blood given to you is checked to make sure it matches your blood type. Your temperature, blood pressure, and heart rate (vital signs) will be checked often.  GET HELP RIGHT AWAY IF:   You feel sick to your stomach (nauseous) or throw up (vomit).  You have watery poop (diarrhea).  You have shortness of breath or trouble breathing.  You have blood in your pee (urine) or have dark colored pee.  You have chest pain or tightness.  Your eyes or skin turn yellow (jaundice).  You have a temperature by mouth above 102 F (38.9 C), not controlled by medicine.  You start to shake and have chills.  You develop a a red rash (hives) or feel itchy.  You develop lightheadedness or feel confused.  You develop back, joint, or muscle pain.  You do not feel hungry (lost appetite).  You feel tired, restless, or nervous.  You develop belly (abdominal) cramps. Document Released: 03/09/2009 Document Revised: 03/04/2012 Document Reviewed: 03/09/2009 Exit[employer] Patient Information 2014 [employer].  

## 2013-11-08 LAB — TYPE AND SCREEN
ABO/RH(D): O POS
Antibody Screen: NEGATIVE
Unit division: 0
Unit division: 0

## 2013-11-15 NOTE — Progress Notes (Signed)
DIAGNOSES: 1. Metastatic adenocarcinoma of the lung. 2. Chronic renal insufficiency. 3. Chronic atrial fibrillation.  CURRENT THERAPY: 1. The patient is status post 2 cycles of reduced dose     Alimta/carboplatin -- the patient tolerated it very poorly. 2. Aranesp 300 mcg subcu as needed for hemoglobin less than 10.  INTERIM HISTORY:  Mr. [name] comes in for a followup.  Unfortunately, he has been in and out of the hospital.  He has had a tough time recently. Despite reduced dose chemotherapy, he really did not tolerate this well at all.  He became quite cytopenic.  He had some bleeding.  He was hospitalized back in October.  I think he was then rehospitalized for problems with atrial fibrillation.  He had been on Coumadin, but this was stopped because of the thrombocytopenia.  He is still weak.  He is at home right now.  He needs physical therapy badly.  We will see about making a referral for him.  His appetite is slow to come back.  He still does not have much taste for food.  We did do a PET scan on him.  This was done on November 5th.  The PET scan showed that he did respond to chemotherapy.  He had a decrease in the right supraclavicular nodal mass.  It now measures 4 x 4.8 cm.  He has a decrease in SUV down to 19.  He has a 2.5 x 2.2 cm red hot paratracheal node, which is decreased down to SUV of 19.5.  There is a new, I think, right gluteal nodule.  I think this measures 1.7 cm.  He also has, I think, a left adrenal nodule, but this does not have any significant hypermetabolism.  Although he has active disease, he just is not a candidate for any further chemotherapy.  He really had a tough time with chemo.  Quality of life is what we need to focus on now.  We will try to get physical therapy in for him.  PHYSICAL EXAMINATION:  General:  This is an elderly, somewhat thin white gentleman, in no obvious distress.  Vital Signs:  Temperature of 97.8, pulse 102,  respiratory rate 16, blood pressure 96/54.  Weight is 140 pounds.  Head and Neck:  Normocephalic and atraumatic skull.  There are no ocular or oral lesions.  He does have the palpable right supraclavicular lymph node.  This is nontender.  It is about, I think, 4 x 3 cm by my palpation.  No other adenopathy is noted in the neck. Lungs:  Pretty clear bilaterally.  He has no rales, wheezes, or rhonchi. Cardiac:  Regular rate and rhythm consistent with atrial fibrillation. He has a 1/6 systolic ejection murmur.  Abdomen:  Soft.  He has decent bowel sounds.  There is no fluid wave.  There is no palpable hepatosplenomegaly.  Back:  No tenderness over the spine, ribs, or hips. Extremities:  Some muscle atrophy in upper and lower extremities.  His muscle strength in his legs is 3+ over 5 bilaterally.  Neurological:  No focal neurological deficits.  LABORATORY STUDIES:  White cell count is 8.9, hemoglobin 7.4, hematocrit 23.2, platelet count 103.  BUN 24, creatinine 2.35.  Potassium is 5.0. Calcium 8.2 with an albumin of 2.5.  Prealbumin is 13.9.  IMPRESSION:  [name] is an 79 year old gentleman with metastatic adenocarcinoma of the lung.  His tumor was negative for EGFR and ALK. He did not do well with systemic chemotherapy.  Our goal now  is quality of life.  I want to try to improve this.  We will transfuse him.  We will give him Aranesp to try to get his hemoglobin up.  I think physical therapy will be very helpful.  If Cardiology wants to get him back onto Coumadin, I do not have any problems with this from a blood count point of view.  Currently, his performance status is ECOG 2 at best.  He is a fall risk, so Coumadin may not be a good thing for the present time.  I will probably plan for another PET scan in 2 or 3 months.  I think this will give me a really good idea as to how quickly or how slowly this tumor is growing.  I talked to his daughter in private.  I told her that I  thought that he probably will require hospice in the near future.  I want to see what his next PET scan shows first.  We can certainly get hospice going sooner if he continues to decline.  I will plan to see him back myself probably in another 3 or 4 weeks.    ______________________________ Josph Macho, M.D. PRE/MEDQ  D:  11/05/2013  T:  11/15/2013  Job:  [id number]

## 2013-11-25 ENCOUNTER — Other Ambulatory Visit (HOSPITAL_BASED_OUTPATIENT_CLINIC_OR_DEPARTMENT_OTHER): Payer: Medicare Other | Admitting: Lab

## 2013-11-25 ENCOUNTER — Ambulatory Visit (HOSPITAL_BASED_OUTPATIENT_CLINIC_OR_DEPARTMENT_OTHER): Payer: Medicare Other | Admitting: Hematology & Oncology

## 2013-11-25 ENCOUNTER — Ambulatory Visit (HOSPITAL_BASED_OUTPATIENT_CLINIC_OR_DEPARTMENT_OTHER): Payer: Medicare Other

## 2013-11-25 DIAGNOSIS — N179 Acute kidney failure, unspecified: Secondary | ICD-10-CM

## 2013-11-25 DIAGNOSIS — D631 Anemia in chronic kidney disease: Secondary | ICD-10-CM

## 2013-11-25 DIAGNOSIS — C3490 Malignant neoplasm of unspecified part of unspecified bronchus or lung: Secondary | ICD-10-CM

## 2013-11-25 DIAGNOSIS — D509 Iron deficiency anemia, unspecified: Secondary | ICD-10-CM

## 2013-11-25 DIAGNOSIS — I4891 Unspecified atrial fibrillation: Secondary | ICD-10-CM

## 2013-11-25 DIAGNOSIS — N189 Chronic kidney disease, unspecified: Secondary | ICD-10-CM

## 2013-11-25 DIAGNOSIS — C799 Secondary malignant neoplasm of unspecified site: Secondary | ICD-10-CM

## 2013-11-25 LAB — CBC WITH DIFFERENTIAL (CANCER CENTER ONLY)
BASO%: 0.2 % (ref 0.0–2.0)
Eosinophils Absolute: 0.1 10*3/uL (ref 0.0–0.5)
HCT: 30.2 % — ABNORMAL LOW (ref 38.7–49.9)
LYMPH#: 0.4 10*3/uL — ABNORMAL LOW (ref 0.9–3.3)
LYMPH%: 4.1 % — ABNORMAL LOW (ref 14.0–48.0)
MCV: 100 fL — ABNORMAL HIGH (ref 82–98)
MONO#: 1 10*3/uL — ABNORMAL HIGH (ref 0.1–0.9)
NEUT#: 9.1 10*3/uL — ABNORMAL HIGH (ref 1.5–6.5)
NEUT%: 85.4 % — ABNORMAL HIGH (ref 40.0–80.0)
Platelets: 72 10*3/uL — ABNORMAL LOW (ref 145–400)
RBC: 3.01 10*6/uL — ABNORMAL LOW (ref 4.20–5.70)
RDW: 20.5 % — ABNORMAL HIGH (ref 11.1–15.7)
WBC: 10.6 10*3/uL — ABNORMAL HIGH (ref 4.0–10.0)

## 2013-11-25 LAB — COMPREHENSIVE METABOLIC PANEL
ALT: 11 U/L (ref 0–53)
AST: 22 U/L (ref 0–37)
Alkaline Phosphatase: 72 U/L (ref 39–117)
Chloride: 107 mEq/L (ref 96–112)
Creatinine, Ser: 2.49 mg/dL — ABNORMAL HIGH (ref 0.50–1.35)
Potassium: 4.9 mEq/L (ref 3.5–5.3)
Sodium: 136 mEq/L (ref 135–145)
Total Bilirubin: 0.7 mg/dL (ref 0.3–1.2)
Total Protein: 5.4 g/dL — ABNORMAL LOW (ref 6.0–8.3)

## 2013-11-25 LAB — PREALBUMIN: Prealbumin: 12.1 mg/dL — ABNORMAL LOW (ref 17.0–34.0)

## 2013-11-25 MED ORDER — DARBEPOETIN ALFA-POLYSORBATE 300 MCG/0.6ML IJ SOLN
INTRAMUSCULAR | Status: AC
  Filled 2013-11-25: qty 0.6

## 2013-11-25 MED ORDER — DARBEPOETIN ALFA-POLYSORBATE 300 MCG/0.6ML IJ SOLN
300.0000 ug | Freq: Once | INTRAMUSCULAR | Status: AC
  Administered 2013-11-25: 300 ug via SUBCUTANEOUS

## 2013-11-25 NOTE — Progress Notes (Signed)
Hematology and Oncology Follow Up Visit  [name] [name] [id number] Aug 10, 1930 79 y.o. 11/25/2013   Principle Diagnosis:   #1 metastatic adenocarcinoma of the lung  #2 chronic renal insufficiency  #3 chronic atrial fibrillation-off anticoagulation  #4 anemia secondary to renal insufficiency  Current Therapy:    Aranesp 300 mcg subcutaneous every 3 weeks for hemoglobin less than 10     Interim History:  [name] [name] is back for followup. He is looking better. He saw his cardiologist. He decided not to put him on anticoagulation because his thrombocytopenia. He has some right hip discomfort. I do not think that this is coming from his gluteal lesion. However, we will need to watch this.  He's not bleeding. He is having a bowel bowels or bladder. He had a good appetite at Thanksgiving. There's been no nausea.  Is doing physical therapy. He is going today for this.   Has not noted any cough or shortness of breath. He's had a swallowing difficulties. There's been no headache. He's had no fever.  Overall, his performance status is ECOG 2-3.    Medications: Current outpatient prescriptions:allopurinol (ZYLOPRIM) 100 MG tablet, Take 100 mg by mouth at bedtime. , Disp: , Rfl: ;  amiodarone (PACERONE) 200 MG tablet, Take 1 tablet (200 mg total) by mouth 2 (two) times daily., Disp: 60 tablet, Rfl: 6;  atorvastatin (LIPITOR) 40 MG tablet, Take 20 mg by mouth every evening. 1/2 tablet daily, Disp: , Rfl:  cholecalciferol (VITAMIN D) 1000 UNITS tablet, Take 1,000 Units by mouth every morning. , Disp: , Rfl: ;  ferrous sulfate 325 (65 FE) MG tablet, Take 325 mg by mouth every evening., Disp: , Rfl: ;  fish oil-omega-3 fatty acids 1000 MG capsule, Take 1 g by mouth 2 (two) times daily. , Disp: , Rfl: ;  FLAXSEED, LINSEED, PO, Take 1 tablet by mouth at bedtime. , Disp: , Rfl:  folic acid (FOLVITE) 400 MCG tablet, Take 400 mcg by mouth every morning. , Disp: , Rfl: ;  isosorbide mononitrate (IMDUR) 60  MG 24 hr tablet, Take 30 mg by mouth every evening. , Disp: , Rfl: ;  metoprolol succinate (TOPROL-XL) 50 MG 24 hr tablet, Take 1 tablet (50 mg total) by mouth 2 (two) times daily. Take with or immediately following a meal., Disp: 60 tablet, Rfl: 3 nitroGLYCERIN (NITROSTAT) 0.4 MG SL tablet, Place 1 tablet (0.4 mg total) under the tongue every 5 (five) minutes as needed for chest pain., Disp: 10 tablet, Rfl: 0;  potassium chloride SA (K-DUR,KLOR-CON) 20 MEQ tablet, Take 10 mEq by mouth 2 (two) times daily. , Disp: , Rfl: ;  Tamsulosin HCl (FLOMAX) 0.4 MG CAPS, Take 0.4 mg by mouth daily after supper. , Disp: , Rfl:  [DISCONTINUED] pantoprazole (PROTONIX) 40 MG tablet, Take 40 mg by mouth daily.  , Disp: , Rfl:  No current facility-administered medications for this visit. Facility-Administered Medications Ordered in Other Visits: heparin lock flush 100 unit/mL, 250 Units, Intracatheter, PRN, Josph Macho, MD;  sodium chloride 0.9 % injection 10 mL, 10 mL, Intracatheter, PRN, Josph Macho, MD  Allergies: No Known Allergies  Past Medical History, Surgical history, Social history, and Family History were reviewed and updated.  Review of Systems: As stated previously in interim history  Physical Exam:  On his physical exam, is an elderly white gentleman in no obvious distress. Vital signs are temperature of 96 9. Pulse 120. Blood pressure 100/70. Respiratory rate 18. Weight is 136. Head and neck exam shows  no ocular or oral lesions. There is no scleral icterus. He has no mucositis. The right supraclavicular lymph node is mobile, and firm and nontender. It probably measures 2 x 2 centimeters. Lungs are clear bilaterally. Has no rales wheezes or rhonchi. Cardiac exam irregular rate and rhythm consistent with atrial fibrillation. Abdomen is soft. Has good bowel sounds. There is no fluid wave. There is no palpable liver or spleen tip. Back exam is unremarkable.Gluteal exam is nonfocal. I really cannot  feel a right gluteal mass. Hips shows some slight tenderness to palpation in the right lateral hip. No swelling is noted. Has good range of motion of the hip. Extremities shows some 1+ edema. This is chronic. Strength is 4/5 bilaterally. He has gouty changes in some of his joints. Neurological exam is nonfocal.  Lab Results  Component Value Date   WBC 10.6* 11/25/2013   HGB 9.7* 11/25/2013   HCT 30.2* 11/25/2013   MCV 100* 11/25/2013   PLT 72* 11/25/2013     Chemistry      Component Value Date/Time   NA 138 11/05/2013 1059   NA 140 10/22/2013 1352   K 5.0 11/05/2013 1059   K 6.3* 10/22/2013 1352   CL 111 11/05/2013 1059   CL 97* 10/22/2013 1352   CO2 15* 11/05/2013 1059   CO2 25 10/22/2013 1352   BUN 24* 11/05/2013 1059   BUN 66* 10/22/2013 1352   CREATININE 2.35* 11/05/2013 1059   CREATININE 5.9* 10/22/2013 1352      Component Value Date/Time   CALCIUM 8.2* 11/05/2013 1059   CALCIUM 9.4 10/22/2013 1352   ALKPHOS 74 11/05/2013 1059   ALKPHOS 76 10/01/2013 0930   AST 26 11/05/2013 1059   AST 22 10/01/2013 0930   ALT 18 11/05/2013 1059   ALT 23 10/01/2013 0930   BILITOT 0.5 11/05/2013 1059   BILITOT 0.70 10/01/2013 0930         Impression and Plan: [name] [name] is an 79 year old gentleman with metastatic adenocarcinoma of the lung. He had chemotherapy which he did poorly with. He had 2 cycles. Even with dose reduction, he cannot tolerate this. We gave him Alimta and carboplatin. I thought about single agent Alimta but he did not feel that he can handle this. He wishes have good quality of life.  He will get physical therapy. I think this will help.  He'll get Aranesp today. His hemoglobin is coming up a little bit. This, I think will make him feel better.  We do need to set him up with another PET scan. We'll get one set up for the end of this month. This will give a very good idea as to how his cancer is progressing.  Weight loss is a little bothersome. This, we need to watch  closely. His wife is doing a great job with him.  I will plan to see him back after his PET scan is done.  To come back in 3 weeks for a blood work and Aranesp injection if his hemoglobin is less than 10.   Josph Macho, MD 12/2/201412:51 PM

## 2013-11-26 ENCOUNTER — Ambulatory Visit: Payer: Medicare Other | Admitting: Physical Therapy

## 2013-11-26 ENCOUNTER — Telehealth: Payer: Self-pay | Admitting: Hematology & Oncology

## 2013-11-26 DIAGNOSIS — R5381 Other malaise: Secondary | ICD-10-CM

## 2013-11-26 DIAGNOSIS — R5383 Other fatigue: Secondary | ICD-10-CM

## 2013-11-26 DIAGNOSIS — M6281 Muscle weakness (generalized): Secondary | ICD-10-CM

## 2013-11-26 DIAGNOSIS — M2550 Pain in unspecified joint: Secondary | ICD-10-CM

## 2013-11-26 NOTE — Telephone Encounter (Signed)
Pt aware of 12-23 lab and inj he didn't want to come 12-24. He is aware of 12-31 PET and to be NPO 6 hrs prior. Pt wants to get jan schedule when he comes in 12-23

## 2013-11-27 ENCOUNTER — Other Ambulatory Visit: Payer: Self-pay | Admitting: Cardiology

## 2013-11-28 ENCOUNTER — Ambulatory Visit (HOSPITAL_COMMUNITY)
Admission: RE | Admit: 2013-11-28 | Discharge: 2013-11-28 | Disposition: A | Discharge status: Home / Self Care | Payer: Medicare Other | Source: Ambulatory Visit | Attending: Hematology & Oncology | Admitting: Hematology & Oncology

## 2013-12-01 ENCOUNTER — Ambulatory Visit (INDEPENDENT_AMBULATORY_CARE_PROVIDER_SITE_OTHER): Payer: Medicare Other | Admitting: Cardiology

## 2013-12-01 ENCOUNTER — Other Ambulatory Visit: Payer: Medicare Other

## 2013-12-01 ENCOUNTER — Encounter: Payer: Self-pay | Admitting: Cardiology

## 2013-12-01 ENCOUNTER — Encounter: Payer: Self-pay | Admitting: *Deleted

## 2013-12-01 VITALS — BP 100/78 | HR 127 | Ht 62.0 in | Wt 137.0 lb

## 2013-12-01 DIAGNOSIS — I2510 Atherosclerotic heart disease of native coronary artery without angina pectoris: Secondary | ICD-10-CM

## 2013-12-01 DIAGNOSIS — I509 Heart failure, unspecified: Secondary | ICD-10-CM

## 2013-12-01 DIAGNOSIS — I5022 Chronic systolic (congestive) heart failure: Secondary | ICD-10-CM

## 2013-12-01 DIAGNOSIS — I340 Nonrheumatic mitral (valve) insufficiency: Secondary | ICD-10-CM

## 2013-12-01 DIAGNOSIS — I059 Rheumatic mitral valve disease, unspecified: Secondary | ICD-10-CM

## 2013-12-01 DIAGNOSIS — I480 Paroxysmal atrial fibrillation: Secondary | ICD-10-CM

## 2013-12-01 DIAGNOSIS — I4891 Unspecified atrial fibrillation: Secondary | ICD-10-CM

## 2013-12-01 DIAGNOSIS — N189 Chronic kidney disease, unspecified: Secondary | ICD-10-CM

## 2013-12-01 DIAGNOSIS — E785 Hyperlipidemia, unspecified: Secondary | ICD-10-CM

## 2013-12-01 MED ORDER — WARFARIN SODIUM 2.5 MG PO TABS: ORAL_TABLET | ORAL | Status: DC

## 2013-12-01 MED ORDER — AMIODARONE HCL 200 MG PO TABS: 200.0000 mg | ORAL_TABLET | Freq: Two times a day (BID) | ORAL | Status: DC

## 2013-12-01 NOTE — Progress Notes (Signed)
CC:   [name], M.D.  DIAGNOSIS:  Metastatic adenocarcinoma of the lung.  CURRENT THERAPY:  Patient is status post one cycle of carboplatin/Alimta.  INTERIM HISTORY:  Mr. Trefz comes in for followup.  He actually looks incredibly well.  He tolerated his first cycle of chemotherapy quite nicely.  He has had very little in the way of nausea and vomiting. There have been no mouth sores.  The mass on his right neck is shrinking quite nicely.  He has had no problems with abdominal pain.  His appetite has been okay. He has had no fevers, sweats or chills.  He has had no diarrhea.  He has had no leg swelling.  There has been no rashes.  He has had no tingling in the hands or feet.  PHYSICAL EXAMINATION:  General:  This is an elderly white gentleman, in no obvious distress.  Vital Signs:  Temperature of 97, pulse 52, respiratory rate 18, blood pressure 92/53, weight is 151.  Head and Neck:  Normocephalic, atraumatic skull.  He has no ocular or oral lesions.  He has no mucositis.  There is no scleral icterus.  The lymphadenopathy in the right supraclavicular region has regressed quite nicely.  He still has about a 3 x 3 cm mobile mass.  This is nontender. There is no left cervical or supraclavicular lymph nodes.  Lungs:  Clear bilaterally.  Cardiac:  Regular rate and rhythm with a normal S1 and S2. There are no murmurs, rubs, or bruits.  Abdomen:  Soft.  He has good bowel sounds.  There is no fluid wave.  There is no palpable hepatosplenomegaly.  Extremities:  No clubbing, cyanosis, or edema. Neurologic:  No focal neurological deficits.  LABORATORY STUDIES:  White cell count is 2.9, hemoglobin 9, hematocrit 28.3, platelet count is 61,000.  IMPRESSION:  Mr. Meader is a very nice 79 year old gentleman with metastatic adenocarcinoma of the lung.  He is on chemotherapy.  This is strictly palliative.  So far, it has helped him out quite nicely.  The right supraclavicular  adenopathy has regressed.  We will go ahead and plan for his second cycle to begin in a couple of weeks.  His platelet count is on the low side.  He is on Coumadin.  His INR yesterday, he said, was 2.  Of note, his erythropoietin level was only 7.2.  We will certainly consider Aranesp if necessary.  I will plan to get him back for his second cycle on October 1st.  I will plan for a PET scan after this second cycle of treatment.    ______________________________ Josph Macho, M.D. PRE/MEDQ  D:  09/11/2013  T:  11/30/2013  Job:  [id number]

## 2013-12-01 NOTE — Patient Instructions (Addendum)
Your physician has recommended that you have a Cardioversion (DCCV). Electrical Cardioversion uses a jolt of electricity to your heart either through paddles or wired patches attached to your chest. This is a controlled, usually prescheduled, procedure. Defibrillation is done under light anesthesia in the hospital, and you usually go home the day of the procedure. This is done to get your heart back into a normal rhythm. You are not awake for the procedure. Please see the instruction sheet given to you today. This will be a TEE/Cardioversion. Friday December 19,2014  Start warfarin (Coumadin) 2.5mg  daily.  Your physician recommends that you schedule a follow-up appointment with Dr Eppie Gibson on December 12,2014 for a protime/INR. Tell her you are scheduled for a TEE/Cardioversion on December 19,2014.  Your physician recommends that you return for lab (STAT PROTIME/INR)   on Friday December 19,2014 at 7:30 AM. You need to have this done before you go to [facility]  at 8:30AM.  Your physician recommends that you schedule a follow-up appointment on December 30,2014 at 2:15 PM with Dr [name].

## 2013-12-01 NOTE — Progress Notes (Signed)
Patient ID: [name] [name], male   DOB: 04-14-1930, 79 y.o.   MRN: [id number] PCP: Dr. Linford Arnold  79 yo with history of CAD s/p CABG, ulcerative colitis s/p colectomy, and CHF presents for cardiology followup.  He had CABG in 10/11 complicated by post-operative atrial fibrillation.  He was started on amiodarone while in the hospital after CABG and this was continued due to risk of further atrial fibrillation in the setting of significantly enlarged left atrium.  He was admitted to the hospital in [place] in 7/12 with a diastolic CHF exacerbation and was diuresed.  I had him stop amiodarone in 2012 given no atrial fibrillation recurrence.    In 7/13, he began to note increased exertional dyspnea.  He went to the hospital in [place] in 7/13 with a diastolic CHF exacerbation.  I am not sure if he was in atrial fibrillation at that time as I have no records of that hospitalization.  It sounds like he was only in the hospital for 2-3 days.  He had an echo done there with EF 35-40%, severe MR, PA systolic pressure 50-60 mmHg.   At a prior appointment, [name] [name] was in atrial fibrillation with RVR.  I started him on coumadin (NOACs not covered by his insurance) and Toprol XL.  In 9/13, I sent him for TEE-guided cardioversion on amiodarone (INR did not stay therapeutic throught the pre-DCCV period so needed TEE).  He converted to NSR.  I did admit him after cardioversion for diuresis.  TEE prior to DCCV confirmed EF 35-40% with severe, post-infarction [name] in the setting of severe hypokinesis to akinesis of the inferior and posterior walls. Repeat echo in [place] in 11/13 showed EF 45-50% with severe [name].  Most recent echo in 5/14 showed EF 45-50% with only mild [name].   [name] [name] developed a right supraclavicular solid neck mass that may be lung adenocarcinoma primary, though no obvious primary lung site was noted on imaging.  He has had radiation directed to the supraclavicular mass and had recent treatment with  carboplatin and Alimta.  He was admitted in 10/14 after with dehydration and AKI (creatinine to 5.9) post-chemotherapy.  He had no appetite and very poor po intake.  In this setting, he went back into atrial fibrillation with RVR.  BP was soft and amiodarone was increased to control rate.  He had been taken off coumadin with low platelets (down to 9,000) and epistaxis.  Most recent creatinine was 2.5.  He is back at home with his wife.  HR today is in the 120s, still atrial fibrillation.  At last appointment, I increased his Toprol XL but it does not seem to have had much effect.  Hemoglobin was improved most recently.  Platelets were 72,000.  He will not be having any further chemotherapy.  He has been walking with a walker due to unsteadiness and hiop pain.  He feels weak overall but denies dyspnea walking around his house.  No chest pain. He is currently off lasix.  Appetite gradually improving but he is down another 3 lbs .   Labs (9/12): LDL 81, HDL 54, K 4.2, creatinine 1.9, BNP 1065 Labs (11/12): K 4.1, creatinine 1.7, BNP 837 Labs (1/13): K 3.7, creatinine 1.6, HDL 52, LDL 100 Labs (5/13): K 4.4, creatinine 1.5, HDL 47, LDL 76 Labs (7/13): creatinine 2.0 Labs (8/13): K 3.9, creatinine 1.9, BNP 1066 Labs (9/13): K 3.5, creatinine 2.4=>2.3, BNP 971, LFTs normal, TSH normal Labs (10/13): K 4.6, creatinine 1.87 Labs (11/13):  K 4.5, creatinine 2.4, LDL 73, HDL 50 Labs (5/14): K 4.3, creatinine 1.95, LFTs normal, HCT 33.4 Labs (10/14): LFTs normal, TSH normal Labs (11/14): K 3.3, creatinine 5.9 => 2.3, HCT 23.7=>23.2, plts 9 => 60=>103 Labs (12/14): K 4.9, creatinine 2.49, HCT 30.2, plts 72, LFTs normal  ECG: atrial fibrillation (versus atypical flutter) with rate 127, RBBB, old inferior MI.   PMH: 1. Paroxysmal atrial fibrillation: Noted initially post-op CABG in 10/11. Back in atrial fibrillation in 8/13, amiodarone restarted along with coumadin.  TEE-guided DCCV in 9/13.  Back in atrial  fibrillation in 10/14 with coumadin held because of decreased plts.  2. Knee osteoarthritis 3. Anemia of uncertain etiology: had erythropoietin shots for a period of time.  4. CAD: MI at age 23.  Had CABG in 10/11 with LIMA-LAD, SVG-D, and SVG-RCA.  Lexiscan myoview (9/13) with EF 34%, large inferior infarct from base to apex with no ischemia.   5. Systolic CHF: Echo (1/12) with EF 50%, inferior and inferoseptal hypokinesis, mild LV hypertrophy, moderate [name], severe left atrial enlargement.  Echo [facility] hospital, 7/13): EF 35-40%, severe [name], moderate-severe LAE, PASP 50-60 mmHg.  TEE (9/13): EF 35-40%, posterior and inferior severe HK to AK, severe post-infarction [name], mildly decreased RV systolic function. Echo (11/13) with EF 45-50%, LV dilation, severe [name], mild RV dilation with moderate RV dysfunction.  6. Ulcerative colitis: s/p total colectomy with ileostomy.  7. HTN 8. Hyperlipidemia 9. Nephrolithiasis 10. CKD: H/o ARF/hyperkalemia in the setting of dehydration.  He is off ACEI and spironolactone.  11. [name]: Severe on 7/13 echo and 9/13 TEE.  Suspect post-infarction [name] in the setting of inferoposterior wall motion abnormality.  12. Gout 13. Metastatic lung adenocarcinoma with right supraclavicular mass diagnosed in 2014.  Patient had caroplatin/Alimta chemotherapy and tolerated poorly.   SH: Retired from [employer].  Married, lives in [place]. Stopped smoking in 1970.    FH: Strong history of CAD.  Father with MI at 61.  All siblings (11) have now passed away, many with heart disease.   ROS: All systems reviewed and negative except as per HPI.   Current Outpatient Prescriptions  Medication Sig Dispense Refill  . allopurinol (ZYLOPRIM) 100 MG tablet Take 100 mg by mouth at bedtime.       [name] atorvastatin (LIPITOR) 40 MG tablet Take 20 mg by mouth every evening. 1/2 tablet daily      . cholecalciferol (VITAMIN D) 1000 UNITS tablet Take 1,000 Units by mouth every morning.       . ferrous  sulfate 325 (65 FE) MG tablet Take 325 mg by mouth every evening.      . fish oil-omega-3 fatty acids 1000 MG capsule Take 1 g by mouth 2 (two) times daily.       [name] FLAXSEED, LINSEED, PO Take 1 tablet by mouth at bedtime.       . folic acid (FOLVITE) 400 MCG tablet Take 400 mcg by mouth every morning.       . isosorbide mononitrate (IMDUR) 60 MG 24 hr tablet Take 30 mg by mouth every evening.       . metoprolol succinate (TOPROL-XL) 50 MG 24 hr tablet Take 1 tablet (50 mg total) by mouth 2 (two) times daily. Take with or immediately following a meal.  60 tablet  3  . nitroGLYCERIN (NITROSTAT) 0.4 MG SL tablet Place 1 tablet (0.4 mg total) under the tongue every 5 (five) minutes as needed for chest pain.  10 tablet  0  .  potassium chloride SA (K-DUR,KLOR-CON) 20 MEQ tablet Take 10 mEq by mouth 2 (two) times daily.       . Tamsulosin HCl (FLOMAX) 0.4 MG CAPS Take 0.4 mg by mouth daily after supper.       [name] amiodarone (PACERONE) 200 MG tablet Take 1 tablet (200 mg total) by mouth 2 (two) times daily.  60 tablet  0  . warfarin (COUMADIN) 2.5 MG tablet 1 tablet daily or as directed  15 tablet  0  . [DISCONTINUED] pantoprazole (PROTONIX) 40 MG tablet Take 40 mg by mouth daily.         No current facility-administered medications for this visit.   Facility-Administered Medications Ordered in Other Visits  Medication Dose Route Frequency Provider Last Rate Last Dose  . heparin lock flush 100 unit/mL  250 Units Intracatheter PRN Josph Macho, MD      . sodium chloride 0.9 % injection 10 mL  10 mL Intracatheter PRN Josph Macho, MD        BP 100/78  Pulse 127  Ht 5\' 2"  (1.575 m)  Wt 62.143 kg (137 lb)  BMI 25.05 kg/m2 General: NAD Neck: JVP not elevated, no thyromegaly or thyroid nodule.  Lungs: Clear to auscultation bilaterally with normal respiratory effort. CV: Nondisplaced PMI.  Heart tachy, irregular S1/S2, no S3/S4, 1/6 HSM at apex. 1+ ankle edema.  No carotid bruit.  Abdomen: Soft,  nontender, no hepatosplenomegaly, no distention.  Neurologic: Alert and oriented x 3.  Psych: Normal affect. Extremities: No clubbing or cyanosis.  Skin: Solid mass at right supraclavicular area  Assessment/Plan  1. Atrial fibrillation  Patient went back into atrial fibrillation with RVR with recent episode of dehydration/AKI.  He remains in atrial fibrillation with RVR today.  Rate has been difficult to control.  He is off coumadin due to thrombocytopenia and anemia associated with chemotherapy but counts are now running higher and he is not going to be getting any more chemotherapy.  - Continue amiodarone at 200 mg bid for now (decrease to 200 mg daily after cardioversion).  Recent LFTs were normal.   - Continue current Toprol XL, do not have BP room to increased.  - Platelets most recently 72,000 and HCT 30.2.  He will not be getting further chemotherapy.  I am going to restart him on coumadin.  Next week, I will plan TEE-guided cardioversion when INR is 2 or above. I do not think that I will be able to control his atrial fibrillation rate without cardioversion.  2. Chronic systolic CHF  EF 16-10% on most recent echo, suspect ischemic cardiomyopathy plays major role. NYHA class II.   - He is off spironolactone and ACEI after ARF with hyperkalemia and given baseline significantly CKD.   - Hydralazine/Imdur and Coreg were held with low blood pressure during recent hospitalization.    - Lasix was held with recent AKI.  I will keep him off Lasix for now given continued poor appetite and weight loss.  He does not appear significantly volume overloaded today. .   - As above, now on Toprol XL.   3. HYPERLIPIDEMIA  Continue atorvastatin with known CAD.   4. Mitral regurgitation  This appeared to be post-infarction [name] with inferoposterior severe hypokinesis to akinesis.  Most recent echo was read as showing mild [name].   5. CAD  Status post CABG.  Last [facility] showed dense inferior scar with  no ischemia.  Continue medical management.  He is off ASA and warfarin with  thrombocytopenia.  Continue statin.  As above, plan to restart warfarin.  6. CKD  Creatinine remains elevated and K is high normal.  Stop supplemental K.    [name] 12/01/2013

## 2013-12-02 ENCOUNTER — Telehealth: Payer: Self-pay | Admitting: Family Medicine

## 2013-12-02 ENCOUNTER — Telehealth: Payer: Self-pay | Admitting: Hematology & Oncology

## 2013-12-02 NOTE — Telephone Encounter (Signed)
Pt called.  His Cardiologist started him on 2.5 mg of Coumadin which he started taking last night. Cardiologist also wants him to let Dr know that he is scheduled for TEE/Cardio version on Dec 19th.

## 2013-12-02 NOTE — Telephone Encounter (Signed)
Patient call and request December calendar to be sent to his home.  Calendar was mailed out today

## 2013-12-05 ENCOUNTER — Encounter: Payer: Self-pay | Admitting: *Deleted

## 2013-12-05 ENCOUNTER — Encounter (HOSPITAL_COMMUNITY): Payer: Self-pay | Admitting: Pharmacy Technician

## 2013-12-05 ENCOUNTER — Ambulatory Visit (INDEPENDENT_AMBULATORY_CARE_PROVIDER_SITE_OTHER): Payer: Medicare Other | Admitting: Family Medicine

## 2013-12-05 DIAGNOSIS — I4891 Unspecified atrial fibrillation: Secondary | ICD-10-CM

## 2013-12-05 LAB — POCT INR: INR: 1.3

## 2013-12-05 NOTE — Progress Notes (Signed)
Spoke to patient gave him new coumadin dose as instructed. Jenesa Foresta,CMA

## 2013-12-05 NOTE — Progress Notes (Signed)
Patient was in office for coumadine check. INR was 1.3. Patient did not have show any negative effects while in the office. [name] [name],CMA

## 2013-12-08 ENCOUNTER — Other Ambulatory Visit: Payer: Self-pay | Admitting: *Deleted

## 2013-12-08 MED ORDER — AMBULATORY NON FORMULARY MEDICATION: Status: DC

## 2013-12-09 ENCOUNTER — Other Ambulatory Visit: Payer: Self-pay

## 2013-12-09 ENCOUNTER — Emergency Department (HOSPITAL_COMMUNITY): Payer: Medicare Other

## 2013-12-09 ENCOUNTER — Inpatient Hospital Stay (HOSPITAL_COMMUNITY)
Admission: EM | Admit: 2013-12-09 | Discharge: 2013-12-11 | DRG: 640 | Disposition: A | Discharge status: Home Health | Payer: Medicare Other | Attending: Internal Medicine | Admitting: Internal Medicine

## 2013-12-09 ENCOUNTER — Telehealth: Payer: Self-pay | Admitting: *Deleted

## 2013-12-09 ENCOUNTER — Encounter (HOSPITAL_COMMUNITY): Payer: Self-pay | Admitting: Emergency Medicine

## 2013-12-09 DIAGNOSIS — D61810 Antineoplastic chemotherapy induced pancytopenia: Secondary | ICD-10-CM

## 2013-12-09 DIAGNOSIS — T451X5A Adverse effect of antineoplastic and immunosuppressive drugs, initial encounter: Secondary | ICD-10-CM

## 2013-12-09 DIAGNOSIS — Z85118 Personal history of other malignant neoplasm of bronchus and lung: Secondary | ICD-10-CM

## 2013-12-09 DIAGNOSIS — C799 Secondary malignant neoplasm of unspecified site: Secondary | ICD-10-CM

## 2013-12-09 DIAGNOSIS — I509 Heart failure, unspecified: Secondary | ICD-10-CM | POA: Diagnosis present

## 2013-12-09 DIAGNOSIS — E860 Dehydration: Secondary | ICD-10-CM

## 2013-12-09 DIAGNOSIS — R0902 Hypoxemia: Secondary | ICD-10-CM

## 2013-12-09 DIAGNOSIS — E875 Hyperkalemia: Secondary | ICD-10-CM

## 2013-12-09 DIAGNOSIS — I739 Peripheral vascular disease, unspecified: Secondary | ICD-10-CM | POA: Diagnosis present

## 2013-12-09 DIAGNOSIS — I5022 Chronic systolic (congestive) heart failure: Secondary | ICD-10-CM

## 2013-12-09 DIAGNOSIS — Z800 Family history of malignant neoplasm of digestive organs: Secondary | ICD-10-CM

## 2013-12-09 DIAGNOSIS — T462X5A Adverse effect of other antidysrhythmic drugs, initial encounter: Secondary | ICD-10-CM | POA: Diagnosis present

## 2013-12-09 DIAGNOSIS — I340 Nonrheumatic mitral (valve) insufficiency: Secondary | ICD-10-CM

## 2013-12-09 DIAGNOSIS — Z8249 Family history of ischemic heart disease and other diseases of the circulatory system: Secondary | ICD-10-CM

## 2013-12-09 DIAGNOSIS — R627 Adult failure to thrive: Secondary | ICD-10-CM | POA: Diagnosis present

## 2013-12-09 DIAGNOSIS — Z951 Presence of aortocoronary bypass graft: Secondary | ICD-10-CM

## 2013-12-09 DIAGNOSIS — E871 Hypo-osmolality and hyponatremia: Principal | ICD-10-CM | POA: Diagnosis present

## 2013-12-09 DIAGNOSIS — E43 Unspecified severe protein-calorie malnutrition: Secondary | ICD-10-CM

## 2013-12-09 DIAGNOSIS — G4700 Insomnia, unspecified: Secondary | ICD-10-CM

## 2013-12-09 DIAGNOSIS — Z7901 Long term (current) use of anticoagulants: Secondary | ICD-10-CM

## 2013-12-09 DIAGNOSIS — I959 Hypotension, unspecified: Secondary | ICD-10-CM

## 2013-12-09 DIAGNOSIS — E872 Acidosis, unspecified: Secondary | ICD-10-CM | POA: Diagnosis present

## 2013-12-09 DIAGNOSIS — M1A9XX1 Chronic gout, unspecified, with tophus (tophi): Secondary | ICD-10-CM

## 2013-12-09 DIAGNOSIS — D696 Thrombocytopenia, unspecified: Secondary | ICD-10-CM | POA: Diagnosis present

## 2013-12-09 DIAGNOSIS — Z9229 Personal history of other drug therapy: Secondary | ICD-10-CM

## 2013-12-09 DIAGNOSIS — C3490 Malignant neoplasm of unspecified part of unspecified bronchus or lung: Secondary | ICD-10-CM | POA: Diagnosis present

## 2013-12-09 DIAGNOSIS — Z79899 Other long term (current) drug therapy: Secondary | ICD-10-CM

## 2013-12-09 DIAGNOSIS — N189 Chronic kidney disease, unspecified: Secondary | ICD-10-CM

## 2013-12-09 DIAGNOSIS — I4891 Unspecified atrial fibrillation: Secondary | ICD-10-CM

## 2013-12-09 DIAGNOSIS — J9819 Other pulmonary collapse: Secondary | ICD-10-CM | POA: Diagnosis present

## 2013-12-09 DIAGNOSIS — N390 Urinary tract infection, site not specified: Secondary | ICD-10-CM

## 2013-12-09 DIAGNOSIS — D689 Coagulation defect, unspecified: Secondary | ICD-10-CM | POA: Diagnosis present

## 2013-12-09 DIAGNOSIS — I059 Rheumatic mitral valve disease, unspecified: Secondary | ICD-10-CM | POA: Diagnosis present

## 2013-12-09 DIAGNOSIS — N184 Chronic kidney disease, stage 4 (severe): Secondary | ICD-10-CM | POA: Diagnosis present

## 2013-12-09 DIAGNOSIS — Z823 Family history of stroke: Secondary | ICD-10-CM

## 2013-12-09 DIAGNOSIS — R0602 Shortness of breath: Secondary | ICD-10-CM

## 2013-12-09 DIAGNOSIS — I129 Hypertensive chronic kidney disease with stage 1 through stage 4 chronic kidney disease, or unspecified chronic kidney disease: Secondary | ICD-10-CM | POA: Diagnosis present

## 2013-12-09 DIAGNOSIS — I252 Old myocardial infarction: Secondary | ICD-10-CM

## 2013-12-09 DIAGNOSIS — R791 Abnormal coagulation profile: Secondary | ICD-10-CM

## 2013-12-09 DIAGNOSIS — D62 Acute posthemorrhagic anemia: Secondary | ICD-10-CM

## 2013-12-09 DIAGNOSIS — IMO0002 Reserved for concepts with insufficient information to code with codable children: Secondary | ICD-10-CM

## 2013-12-09 DIAGNOSIS — D638 Anemia in other chronic diseases classified elsewhere: Secondary | ICD-10-CM

## 2013-12-09 DIAGNOSIS — I10 Essential (primary) hypertension: Secondary | ICD-10-CM

## 2013-12-09 DIAGNOSIS — E785 Hyperlipidemia, unspecified: Secondary | ICD-10-CM

## 2013-12-09 DIAGNOSIS — R198 Other specified symptoms and signs involving the digestive system and abdomen: Secondary | ICD-10-CM

## 2013-12-09 DIAGNOSIS — I255 Ischemic cardiomyopathy: Secondary | ICD-10-CM

## 2013-12-09 DIAGNOSIS — I480 Paroxysmal atrial fibrillation: Secondary | ICD-10-CM

## 2013-12-09 DIAGNOSIS — Z9221 Personal history of antineoplastic chemotherapy: Secondary | ICD-10-CM

## 2013-12-09 DIAGNOSIS — Z933 Colostomy status: Secondary | ICD-10-CM

## 2013-12-09 DIAGNOSIS — I2589 Other forms of chronic ischemic heart disease: Secondary | ICD-10-CM | POA: Diagnosis present

## 2013-12-09 DIAGNOSIS — I2510 Atherosclerotic heart disease of native coronary artery without angina pectoris: Secondary | ICD-10-CM

## 2013-12-09 DIAGNOSIS — Z87442 Personal history of urinary calculi: Secondary | ICD-10-CM

## 2013-12-09 DIAGNOSIS — Z66 Do not resuscitate: Secondary | ICD-10-CM

## 2013-12-09 DIAGNOSIS — Z87891 Personal history of nicotine dependence: Secondary | ICD-10-CM

## 2013-12-09 LAB — CBC WITH DIFFERENTIAL/PLATELET
Basophils Absolute: 0 10*3/uL (ref 0.0–0.1)
Eosinophils Relative: 1 % (ref 0–5)
HCT: 27.5 % — ABNORMAL LOW (ref 39.0–52.0)
Hemoglobin: 8.9 g/dL — ABNORMAL LOW (ref 13.0–17.0)
Lymphocytes Relative: 6 % — ABNORMAL LOW (ref 12–46)
Monocytes Absolute: 0.5 10*3/uL (ref 0.1–1.0)
Monocytes Relative: 7 % (ref 3–12)
Neutro Abs: 5.9 10*3/uL (ref 1.7–7.7)
Neutrophils Relative %: 86 % — ABNORMAL HIGH (ref 43–77)
RBC: 2.76 MIL/uL — ABNORMAL LOW (ref 4.22–5.81)
RDW: 22.3 % — ABNORMAL HIGH (ref 11.5–15.5)
WBC: 6.9 10*3/uL (ref 4.0–10.5)

## 2013-12-09 LAB — URINALYSIS, ROUTINE W REFLEX MICROSCOPIC
Bilirubin Urine: NEGATIVE
Ketones, ur: NEGATIVE mg/dL
Nitrite: NEGATIVE
Specific Gravity, Urine: 1.015 (ref 1.005–1.030)
Urobilinogen, UA: 0.2 mg/dL (ref 0.0–1.0)
pH: 5.5 (ref 5.0–8.0)

## 2013-12-09 LAB — BASIC METABOLIC PANEL
BUN: 48 mg/dL — ABNORMAL HIGH (ref 6–23)
Calcium: 9 mg/dL (ref 8.4–10.5)
Chloride: 105 mEq/L (ref 96–112)
Creatinine, Ser: 2.51 mg/dL — ABNORMAL HIGH (ref 0.50–1.35)
GFR calc Af Amer: 26 mL/min — ABNORMAL LOW (ref >90)
GFR calc non Af Amer: 23 mL/min — ABNORMAL LOW (ref >90)
Glucose, Bld: 75 mg/dL (ref 70–99)
Potassium: 5.4 mEq/L — ABNORMAL HIGH (ref 3.5–5.1)
Sodium: 133 mEq/L — ABNORMAL LOW (ref 135–145)

## 2013-12-09 LAB — PRO B NATRIURETIC PEPTIDE: Pro B Natriuretic peptide (BNP): 31194 pg/mL — ABNORMAL HIGH (ref 0–450)

## 2013-12-09 LAB — POCT I-STAT TROPONIN I

## 2013-12-09 LAB — PROTIME-INR
INR: 3.61 — ABNORMAL HIGH (ref 0.00–1.49)
Prothrombin Time: 34.6 seconds — ABNORMAL HIGH (ref 11.6–15.2)

## 2013-12-09 LAB — URINE MICROSCOPIC-ADD ON

## 2013-12-09 MED ORDER — CEPHALEXIN 500 MG PO CAPS: 500.0000 mg | ORAL_CAPSULE | Freq: Two times a day (BID) | ORAL | Status: DC

## 2013-12-09 MED ORDER — SODIUM POLYSTYRENE SULFONATE 15 GM/60ML PO SUSP
30.0000 g | Freq: Once | ORAL | Status: AC
  Administered 2013-12-09: 30 g via ORAL
  Filled 2013-12-09 (×2): qty 120

## 2013-12-09 MED ORDER — OXYCODONE-ACETAMINOPHEN 5-325 MG PO TABS
1.0000 | ORAL_TABLET | Freq: Once | ORAL | Status: AC
  Administered 2013-12-09: 1 via ORAL
  Filled 2013-12-09: qty 1

## 2013-12-09 MED ORDER — SODIUM CHLORIDE 0.9 % IV BOLUS (SEPSIS)
500.0000 mL | Freq: Once | INTRAVENOUS | Status: AC
  Administered 2013-12-09: 500 mL via INTRAVENOUS

## 2013-12-09 MED ORDER — SODIUM CHLORIDE 0.9 % IJ SOLN
INTRAMUSCULAR | Status: AC
  Filled 2013-12-09: qty 10

## 2013-12-09 MED ORDER — TECHNETIUM TO 99M ALBUMIN AGGREGATED
4.9000 | Freq: Once | INTRAVENOUS | Status: AC | PRN
  Administered 2013-12-09: 4.9 via INTRAVENOUS

## 2013-12-09 MED ORDER — TECHNETIUM TC 99M DIETHYLENETRIAME-PENTAACETIC ACID: 40.3000 | Freq: Once | INTRAVENOUS | Status: AC | PRN

## 2013-12-09 MED ORDER — SODIUM CHLORIDE 0.9 % IV BOLUS (SEPSIS): 250.0000 mL | Freq: Once | INTRAVENOUS | Status: DC

## 2013-12-09 NOTE — ED Provider Notes (Addendum)
CSN: [id number]     Arrival date & time 12/09/13  1623 History   First MD Initiated Contact with Patient 12/09/13 1658     Chief Complaint  Patient presents with  . Shortness of Breath  . Weakness   (Consider location/radiation/quality/duration/timing/severity/associated sxs/prior Treatment) HPI  This is an 79 year old male with a history of coronary artery disease, lung cancer who presents with shortness of breath and generalized weakness. Patient reports a 2-3 day history of increasing generalized weakness. Patient reports that he is having difficulty getting out of bed. He reports dizziness upon standing. Patient denies any focal weakness or numbness. Patient also reports worsening dyspnea on exertion. He denies orthopnea. He denies any fevers or cough. Patient has a history of A. fib/flutter and is currently on Coumadin. He still being treated for his lung cancer. He does have a history of heart failure.  Past Medical History  Diagnosis Date  . Coronary artery disease   . MI, old     AGE 69  . Ulcerative colitis   . Hyperlipidemia   . Hypertension   . History of nephrolithiasis   . PAF (paroxysmal atrial fibrillation)   . LV dysfunction   . Myocardial infarction   . Anemia   . Blood transfusion   . Arthritis   . CHF (congestive heart failure)   . Mitral regurgitation   . Pneumonia     years ago  . Peripheral vascular disease   . 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 Acute renal failure     "related ? Kidney stone blockage"  . Gout flare     right index finger-tx. prednisone, allopurinol  . Swelling 05-08-13    rt. base of neck-something recent.  . Cancer 05/29/13    metastatic adenocarcinoma of right neck mass  . Metastatic adenocarcinoma 05/29/13    right supraclavicular area   Past Surgical History  Procedure Laterality Date  . Cardiac catheterization  10/14/2010    EF 30%  . Coronary artery bypass graft  10/24/2010    LIMA TO THE LAD, A SAPHENOUS VEIN  GRAFT TO THE DIAGONAL 1 AND 3, AND SAPHENOUS VEIN GRAFT TO THE DISTAL RIGHT CORONARY ARTERY  . Ileostomy  04/23/73  . Colectomy    . Cataract extraction    . Transthoracic echocardiogram  01/16/2011    EF 50%  . Cardiovascular stress test  10/07/2010    EF 33%  . Appendectomy    . Cystostomy  04/22/2012    Procedure: CYSTOSTOMY SUPRAPUBIC;  Surgeon: Garnett Farm, MD;  Location: [facility];  Service: Urology;  Laterality: N/A;  CYSTOTOMY WITH MIDLINE INCISION   . Tee without cardioversion  08/28/2012    Procedure: TRANSESOPHAGEAL ECHOCARDIOGRAM (TEE);  Surgeon: [name], MD;  Location: [facility] END[facility];  Service: Cardiovascular;  Laterality: N/A;  . Cardioversion  08/28/2012    Procedure: CARDIOVERSION;  Surgeon: [name], MD;  Location: [facility] ENDOSCOPY;  Service: Cardiovascular;  Laterality: N/A;  . Cystoscopy w/ ureteral stent placement Right 05/12/2013    Procedure: CYSTOSCOPY WITH RIGHT RETROGRADE PYELOGRAM/ RIGHT URETERAL STENT PLACEMENT;  Surgeon: Garnett Farm, MD;  Location: [facility];  Service: Urology;  Laterality: Right;   Family History  Problem Relation Age of Onset  . Colon cancer Mother     colon  . Heart attack Father   . Hypertension Father   . Heart disease Sister   . Hypertension Sister   . Heart disease Brother   .  Hypertension Brother   . Heart disease Sister   . Hypertension Sister   . Heart disease Sister   . Hypertension Sister   . Heart disease Brother   . Hypertension Brother   . Heart disease Brother   . Hypertension Brother   . Heart disease Brother   . Hypertension Brother   . Heart disease Brother   . Hypertension Brother   . Heart disease Brother   . Hypertension Brother   . Heart disease Brother   . Hypertension Brother   . Heart disease Brother   . Hypertension Brother   . Stroke Brother    History  Substance Use Topics  . Smoking status: Former Smoker -- 1.00 packs/day for 15 years    Types: Cigarettes, Cigars    Quit date: 12/25/1968   . Smokeless tobacco: Not on file  . Alcohol Use: No     Comment: occasional glass of wine    Review of Systems  Constitutional: Negative for fever.  Respiratory: Positive for shortness of breath. Negative for chest tightness.   Cardiovascular: Negative.  Negative for chest pain and leg swelling.  Gastrointestinal: Negative.  Negative for abdominal pain.  Genitourinary: Negative.  Negative for dysuria.  Musculoskeletal: Negative for back pain.  Skin: Negative for rash.  Neurological: Positive for dizziness, weakness and light-headedness. Negative for speech difficulty and headaches.  All other systems reviewed and are negative.    Allergies  Review of patient's allergies indicates no known allergies.  Home Medications   Current Outpatient Rx  Name  Route  Sig  Dispense  Refill  . acetaminophen (TYLENOL) 500 MG tablet   Oral   Take 500 mg by mouth every 6 (six) hours as needed for moderate pain.         [name] allopurinol (ZYLOPRIM) 100 MG tablet   Oral   Take 100 mg by mouth at bedtime.          [name] amiodarone (PACERONE) 200 MG tablet   Oral   Take 1 tablet (200 mg total) by mouth 2 (two) times daily.   60 tablet   0   . atorvastatin (LIPITOR) 40 MG tablet   Oral   Take 20 mg by mouth every evening. 1/2 tablet daily         . cholecalciferol (VITAMIN D) 1000 UNITS tablet   Oral   Take 1,000 Units by mouth every morning.          . ferrous sulfate 325 (65 FE) MG tablet   Oral   Take 325 mg by mouth every evening.         . fish oil-omega-3 fatty acids 1000 MG capsule   Oral   Take 1 g by mouth 2 (two) times daily.          [name] FLAXSEED, LINSEED, PO   Oral   Take 1 tablet by mouth at bedtime.          . folic acid (FOLVITE) 400 MCG tablet   Oral   Take 400 mcg by mouth every morning.          . isosorbide mononitrate (IMDUR) 60 MG 24 hr tablet   Oral   Take 30 mg by mouth every evening.          . metoprolol succinate (TOPROL-XL) 25 MG 24 hr  tablet   Oral   Take 25 mg by mouth 2 (two) times daily.         . Tamsulosin HCl (  FLOMAX) 0.4 MG CAPS   Oral   Take 0.4 mg by mouth daily after supper.          . warfarin (COUMADIN) 5 MG tablet   Oral   Take 2.5-5 mg by mouth every evening. Take 1 tablet (5 mg) on Sunday, Monday, Wednesday, Friday. Take 0.5 tablet (2.5 mg) on Tuesday, Thursday, Saturday.         . cephALEXin (KEFLEX) 500 MG capsule   Oral   Take 1 capsule (500 mg total) by mouth 2 (two) times daily.   14 capsule   0   . nitroGLYCERIN (NITROSTAT) 0.4 MG SL tablet   Sublingual   Place 1 tablet (0.4 mg total) under the tongue every 5 (five) minutes as needed for chest pain.   10 tablet   0    BP 91/60  Pulse 99  Temp(Src) 98 F (36.7 C) (Oral)  Resp 26  SpO2 97% Physical Exam  Nursing note and vitals reviewed. Constitutional: He is oriented to person, place, and time. No distress.  Elderly  HENT:  Head: Normocephalic and atraumatic.  Mucous membranes dry  Eyes: Pupils are equal, round, and reactive to light.  Neck: Neck supple.  Cardiovascular: Normal heart sounds.   No murmur heard. Tachycardia  Pulmonary/Chest: Effort normal and breath sounds normal. No respiratory distress. He has no wheezes.  portacath in left upper chest  Abdominal: Soft. Bowel sounds are normal. There is no tenderness. There is no rebound.  Musculoskeletal: He exhibits no edema.  Lymphadenopathy:    He has no cervical adenopathy.  Neurological: He is alert and oriented to person, place, and time. No cranial nerve deficit. Coordination normal.  5 out of 5 strength in all 4 extremities  Skin: Skin is warm and dry.  Psychiatric: He has a normal mood and affect.    ED Course  Procedures (including critical care time) Labs Review Labs Reviewed  CBC WITH DIFFERENTIAL - Abnormal; Notable for the following:    RBC 2.76 (*)    Hemoglobin 8.9 (*)    HCT 27.5 (*)    RDW 22.3 (*)    Platelets 72 (*)    Neutrophils  Relative % 86 (*)    Lymphocytes Relative 6 (*)    Lymphs Abs 0.4 (*)    All other components within normal limits  BASIC METABOLIC PANEL - Abnormal; Notable for the following:    Sodium 133 (*)    Potassium 5.4 (*)    CO2 16 (*)    BUN 48 (*)    Creatinine, Ser 2.51 (*)    GFR calc non Af Amer 23 (*)    GFR calc Af Amer 26 (*)    All other components within normal limits  URINALYSIS, ROUTINE W REFLEX MICROSCOPIC - Abnormal; Notable for the following:    APPearance CLOUDY (*)    Leukocytes, UA MODERATE (*)    All other components within normal limits  PRO B NATRIURETIC PEPTIDE - Abnormal; Notable for the following:    Pro B Natriuretic peptide (BNP) 31194.0 (*)    All other components within normal limits  PROTIME-INR - Abnormal; Notable for the following:    Prothrombin Time 34.6 (*)    INR 3.61 (*)    All other components within normal limits  URINE CULTURE  URINE MICROSCOPIC-ADD ON  POCT I-STAT TROPONIN I   Imaging Review Dg Chest 2 View  12/09/2013   CLINICAL DATA:  History lung cancer  EXAM: CHEST  2 VIEW  COMPARISON:  PET-CT 10/29/2013  FINDINGS: There is a left-sided Port-A-Cath with the tip projecting over the SVC. There is right paratracheal soft tissue prominence in the right supraclavicular and infraclavicular region with leftward deviation of the trachea. There is no focal consolidation, pleural effusion or pneumothorax. The heart and mediastinal contours are unremarkable.  The osseous structures are unremarkable.  IMPRESSION: No active cardiopulmonary disease.  Right upper paratracheal soft tissue prominence better visualized on prior PET-CT of 10/29/2013.   Electronically Signed   By: Elige Ko   On: 12/09/2013 17:50   Nm Pulmonary Perf And Vent  12/09/2013   CLINICAL DATA:  Chest pain, shortness of breath, history lung cancer, renal dysfunction  EXAM: NUCLEAR MEDICINE VENTILATION - PERFUSION LUNG SCAN  TECHNIQUE: Ventilation images were obtained in multiple  projections using inhaled aerosol technetium 99 M DTPA. Perfusion images were obtained in multiple projections after intravenous injection of Tc-38m MAA.  COMPARISON:  None  Correlation:  Chest radiograph 12/09/2013  RADIOPHARMACEUTICALS:  40.3 mCi Tc-75m DTPA aerosol and 4.9 mCi Tc-32m MAA  FINDINGS: Ventilation: Central airway deposition of aerosol. Patchy diminished ventilation in right upper lobe. Small amount of swallowed aerosol within stomach.  Perfusion: Much better perfusion than ventilation particularly in right upper lobe. No segmental or subsegmental perfusion defects. Minimally irregularity of perfusion at the lower lobes.  Chest radiograph significant for upper normal heart size post median sternotomy/ CABG, Port-A-Cath, and mild elevation of right diaphragm.  IMPRESSION: Very low probability for pulmonary embolism.   Electronically Signed   By: Ulyses Southward M.D.   On: 12/09/2013 22:18    EKG Interpretation   None      EKG independently reviewed by myself: Atrial flutter with 2:1 block, right bundle branch block noted  CRITICAL CARE Performed by: Ross Marcus, F   Total critical care time: 30 min  Critical care time was exclusive of separately billable procedures and treating other patients.  Critical care was necessary to treat or prevent imminent or life-threatening deterioration.  Critical care was time spent personally by me on the following activities: development of treatment plan with patient and/or surrogate as well as nursing, discussions with consultants, evaluation of patient's response to treatment, examination of patient, obtaining history from patient or surrogate, ordering and performing treatments and interventions, ordering and review of laboratory studies, ordering and review of radiographic studies, pulse oximetry and re-evaluation of patient's condition.  MDM   1. Shortness of breath   2. Supratherapeutic INR   3. Hyperkalemia      Patient presents  with generalized weakness and dyspnea on exertion.  Initial vital signs notable for pulse of 105. Patient appears to be in atrial flutter with a 2 to one block that is rate controlled. He is not hypoxic and satting 100% on room air. He has no evidence of peripheral edema or crackles. Basic labwork was obtained. Patient was given a 500 cc bolus. Orthostatics negative. Troponin is negative.  Lab work is at the patient's baseline including patient's creatinine. Potassium is 5.4 without evidence of EKG changes. His INR is 3.61 which is slightly elevated. Patient's BNP is >31,000. He has a known history of CHF with EF of 45-50% but no evidence of systemic volume overload and a chest x-ray that shows no evidence of edema or effusions. Given patient's history of cancer, he would be at risk for pulmonary embolus. V/Q scan was obtained and is low probability for PE.  I discussed the patient with Dr. Ladona Ridgel who is on  for cardiology. I am unsure the clinical significance of the patient's BNP of 31,000 without evidence of volume overload. The patient has maintained oxygen saturations at 100%. He is currently asymptomatic. He has never had chest pain. Dr. Ladona Ridgel agrees that the patient can likely be discharged with very close followup tomorrow in cardiology clinic.   Of note, patient was noted to have blood pressures ranging from 91/62 - 106/75. I reviewed the patient's chart including recent office visits and recent hospitalizations. It appears patient's blood pressure at baseline ranges from systolics in the high 90s to low 100s.  Patient was given Kayexalate for his mildly elevated potassium.Of note, patient's urine also appears dirty.  Last urine culture was negative. Will cover with Keflex and send urine culture.  12:32 AM Patient was ambulated with pulse ox.  Dropped he oxygen saturations to 82% with ambulation.  NOt on supplemental oxygen at home.  Will be admitted given desaturations with  ambulation.      Shon Baton, MD 12/10/13 1478  Shon Baton, MD 12/10/13 [phone]

## 2013-12-09 NOTE — ED Notes (Signed)
Pt reports generalized weakness that began one week ago. Family member states that he is not eating normally. Pt has lung cancer, which he reports his last chemotherapy and radiation in August. Pt reports shortness of breath with ambulation. Pt is A/O x4. Pt is tachycardiac at 110 during triage, however has an oxygen saturation of 99% on room air.

## 2013-12-09 NOTE — ED Notes (Signed)
Pt is aware of the need for urine, however states he voided prior to arriving in room.

## 2013-12-09 NOTE — Telephone Encounter (Signed)
Pt's wife called stating that he isn't doing well and she is getting concerned about his condition. He is getting weaker, has intermittent shakiness, poor po intake, new back pain. He has known metastatic lung cancer. Advised her to take him to [facility] ER for evaluation. She verbalized understand and stated she would take him there.

## 2013-12-10 ENCOUNTER — Encounter (HOSPITAL_COMMUNITY): Payer: Self-pay | Admitting: Internal Medicine

## 2013-12-10 DIAGNOSIS — I4891 Unspecified atrial fibrillation: Secondary | ICD-10-CM

## 2013-12-10 DIAGNOSIS — E875 Hyperkalemia: Secondary | ICD-10-CM

## 2013-12-10 DIAGNOSIS — N179 Acute kidney failure, unspecified: Secondary | ICD-10-CM

## 2013-12-10 DIAGNOSIS — E860 Dehydration: Secondary | ICD-10-CM | POA: Diagnosis present

## 2013-12-10 DIAGNOSIS — I2589 Other forms of chronic ischemic heart disease: Secondary | ICD-10-CM

## 2013-12-10 DIAGNOSIS — Z66 Do not resuscitate: Secondary | ICD-10-CM

## 2013-12-10 DIAGNOSIS — I5022 Chronic systolic (congestive) heart failure: Secondary | ICD-10-CM

## 2013-12-10 DIAGNOSIS — I10 Essential (primary) hypertension: Secondary | ICD-10-CM

## 2013-12-10 DIAGNOSIS — I509 Heart failure, unspecified: Secondary | ICD-10-CM

## 2013-12-10 DIAGNOSIS — R0902 Hypoxemia: Secondary | ICD-10-CM

## 2013-12-10 DIAGNOSIS — I959 Hypotension, unspecified: Secondary | ICD-10-CM | POA: Diagnosis present

## 2013-12-10 DIAGNOSIS — N390 Urinary tract infection, site not specified: Secondary | ICD-10-CM | POA: Diagnosis present

## 2013-12-10 DIAGNOSIS — I059 Rheumatic mitral valve disease, unspecified: Secondary | ICD-10-CM

## 2013-12-10 DIAGNOSIS — R0602 Shortness of breath: Secondary | ICD-10-CM | POA: Diagnosis present

## 2013-12-10 LAB — CBC
HCT: 27.9 % — ABNORMAL LOW (ref 39.0–52.0)
Hemoglobin: 9.1 g/dL — ABNORMAL LOW (ref 13.0–17.0)
MCH: 33 pg (ref 26.0–34.0)
MCV: 101.1 fL — ABNORMAL HIGH (ref 78.0–100.0)
Platelets: 67 10*3/uL — ABNORMAL LOW (ref 150–400)
RBC: 2.76 MIL/uL — ABNORMAL LOW (ref 4.22–5.81)
WBC: 6.2 10*3/uL (ref 4.0–10.5)

## 2013-12-10 LAB — COMPREHENSIVE METABOLIC PANEL
AST: 19 U/L (ref 0–37)
Albumin: 1.9 g/dL — ABNORMAL LOW (ref 3.5–5.2)
Alkaline Phosphatase: 67 U/L (ref 39–117)
BUN: 46 mg/dL — ABNORMAL HIGH (ref 6–23)
Calcium: 8.9 mg/dL (ref 8.4–10.5)
Creatinine, Ser: 2.41 mg/dL — ABNORMAL HIGH (ref 0.50–1.35)
Total Bilirubin: 0.4 mg/dL (ref 0.3–1.2)
Total Protein: 5.3 g/dL — ABNORMAL LOW (ref 6.0–8.3)

## 2013-12-10 LAB — IRON AND TIBC
Iron: 21 ug/dL — ABNORMAL LOW (ref 42–135)
TIBC: 139 ug/dL — ABNORMAL LOW (ref 215–435)
UIBC: 118 ug/dL — ABNORMAL LOW (ref 125–400)

## 2013-12-10 LAB — TESTOSTERONE: Testosterone: 68 ng/dL — ABNORMAL LOW (ref 300–890)

## 2013-12-10 LAB — PROTIME-INR: Prothrombin Time: 36.9 seconds — ABNORMAL HIGH (ref 11.6–15.2)

## 2013-12-10 MED ORDER — ALBUTEROL SULFATE (5 MG/ML) 0.5% IN NEBU: 2.5000 mg | INHALATION_SOLUTION | RESPIRATORY_TRACT | Status: DC | PRN

## 2013-12-10 MED ORDER — FOLIC ACID 1 MG PO TABS
1000.0000 ug | ORAL_TABLET | Freq: Every day | ORAL | Status: DC
  Administered 2013-12-10 – 2013-12-11 (×2): 1 mg via ORAL
  Filled 2013-12-10 (×2): qty 1

## 2013-12-10 MED ORDER — SODIUM CHLORIDE 0.9 % IJ SOLN
3.0000 mL | Freq: Two times a day (BID) | INTRAMUSCULAR | Status: DC
  Administered 2013-12-11: 3 mL via INTRAVENOUS

## 2013-12-10 MED ORDER — OMEGA-3-ACID ETHYL ESTERS 1 G PO CAPS
1.0000 g | ORAL_CAPSULE | Freq: Two times a day (BID) | ORAL | Status: DC
  Administered 2013-12-10 – 2013-12-11 (×3): 1 g via ORAL
  Filled 2013-12-10 (×4): qty 1

## 2013-12-10 MED ORDER — ONDANSETRON HCL 4 MG PO TABS: 4.0000 mg | ORAL_TABLET | Freq: Four times a day (QID) | ORAL | Status: DC | PRN

## 2013-12-10 MED ORDER — SODIUM CHLORIDE 0.9 % IV SOLN
1020.0000 mg | Freq: Once | INTRAVENOUS | Status: AC
  Administered 2013-12-10: 1020 mg via INTRAVENOUS
  Filled 2013-12-10: qty 34

## 2013-12-10 MED ORDER — ACETAMINOPHEN 325 MG PO TABS: 650.0000 mg | ORAL_TABLET | Freq: Four times a day (QID) | ORAL | Status: DC | PRN

## 2013-12-10 MED ORDER — TAMSULOSIN HCL 0.4 MG PO CAPS
0.4000 mg | ORAL_CAPSULE | Freq: Every day | ORAL | Status: DC
  Administered 2013-12-10: 0.4 mg via ORAL
  Filled 2013-12-10 (×2): qty 1

## 2013-12-10 MED ORDER — VITAMINS A & D EX OINT
TOPICAL_OINTMENT | CUTANEOUS | Status: AC
  Administered 2013-12-10: 04:00:00
  Filled 2013-12-10: qty 5

## 2013-12-10 MED ORDER — SODIUM CHLORIDE 0.9 % IJ SOLN
10.0000 mL | INTRAMUSCULAR | Status: DC | PRN
  Administered 2013-12-10 – 2013-12-11 (×3): 10 mL

## 2013-12-10 MED ORDER — ONDANSETRON HCL 4 MG/2ML IJ SOLN: 4.0000 mg | Freq: Four times a day (QID) | INTRAMUSCULAR | Status: DC | PRN

## 2013-12-10 MED ORDER — BOOST / RESOURCE BREEZE PO LIQD
1.0000 | Freq: Two times a day (BID) | ORAL | Status: DC
  Administered 2013-12-10: 1 via ORAL

## 2013-12-10 MED ORDER — AMIODARONE HCL 200 MG PO TABS
200.0000 mg | ORAL_TABLET | Freq: Two times a day (BID) | ORAL | Status: DC
  Administered 2013-12-10 – 2013-12-11 (×3): 200 mg via ORAL
  Filled 2013-12-10 (×5): qty 1

## 2013-12-10 MED ORDER — ACETAMINOPHEN 650 MG RE SUPP: 650.0000 mg | Freq: Four times a day (QID) | RECTAL | Status: DC | PRN

## 2013-12-10 MED ORDER — SODIUM CHLORIDE 0.9 % IV SOLN
INTRAVENOUS | Status: AC
  Administered 2013-12-10: 1000 mL via INTRAVENOUS

## 2013-12-10 MED ORDER — VITAMIN D3 25 MCG (1000 UNIT) PO TABS
1000.0000 [IU] | ORAL_TABLET | Freq: Every day | ORAL | Status: DC
  Administered 2013-12-10 – 2013-12-11 (×2): 1000 [IU] via ORAL
  Filled 2013-12-10 (×2): qty 1

## 2013-12-10 MED ORDER — DARBEPOETIN ALFA-POLYSORBATE 300 MCG/0.6ML IJ SOLN
300.0000 ug | Freq: Once | INTRAMUSCULAR | Status: AC
  Administered 2013-12-10: 10:00:00 300 ug via SUBCUTANEOUS
  Filled 2013-12-10: qty 0.6

## 2013-12-10 MED ORDER — WARFARIN - PHARMACIST DOSING INPATIENT: Freq: Every day | Status: DC

## 2013-12-10 MED ORDER — ALLOPURINOL 100 MG PO TABS
100.0000 mg | ORAL_TABLET | Freq: Every day | ORAL | Status: DC
  Administered 2013-12-10: 100 mg via ORAL
  Filled 2013-12-10 (×2): qty 1

## 2013-12-10 MED ORDER — DEXTROSE 5 % IV SOLN
1.0000 g | Freq: Every day | INTRAVENOUS | Status: DC
  Administered 2013-12-10 (×2): 1 g via INTRAVENOUS
  Filled 2013-12-10 (×3): qty 10

## 2013-12-10 MED ORDER — NITROGLYCERIN 0.4 MG SL SUBL: 0.4000 mg | SUBLINGUAL_TABLET | SUBLINGUAL | Status: DC | PRN

## 2013-12-10 MED ORDER — FERROUS SULFATE 325 (65 FE) MG PO TABS
325.0000 mg | ORAL_TABLET | Freq: Every evening | ORAL | Status: DC
  Filled 2013-12-10: qty 1

## 2013-12-10 MED ORDER — ATORVASTATIN CALCIUM 20 MG PO TABS
20.0000 mg | ORAL_TABLET | Freq: Every evening | ORAL | Status: DC
  Administered 2013-12-10: 20 mg via ORAL
  Filled 2013-12-10 (×2): qty 1

## 2013-12-10 MED ORDER — ENSURE COMPLETE PO LIQD
237.0000 mL | Freq: Two times a day (BID) | ORAL | Status: DC
  Administered 2013-12-10: 237 mL via ORAL

## 2013-12-10 NOTE — Consult Note (Signed)
CONSULT NOTE  Date: 12/10/2013               Patient Name:  [name] [name] MRN: [id number]  DOB: 15-Sep-1930 Age / Sex: 79 y.o., male        PCP: METHENEY,CATHERINE Primary Cardiologist: [name]            Referring Physician: Judie Petit. Short, MD              Reason for Consult: Atrial fib           History of Present Illness: Patient is a 79 y.o. male with a PMHx of metastatic lung ca , who was admitted to [facility] on 12/09/2013 for evaluation of  Weakness,lack of energy, and UTI.  He was though to be dehydrated.    He was scheduled for a TEE cardioversion  this Friday.  His INR was 1.1 on Dec. 12.  His appetite has been poor for the past week or so.   He is completely asymptomatic from an A-fib standpoint.   He cannot tell that his HR is irregular.   No CP , no dyspnea at rest.   He is short of breath with daily activities.    Today his HR is 104. BP is 107/77   Medications: Outpatient medications: Prescriptions prior to admission  Medication Sig Dispense Refill  . acetaminophen (TYLENOL) 500 MG tablet Take 500 mg by mouth every 6 (six) hours as needed for moderate pain.      [name] allopurinol (ZYLOPRIM) 100 MG tablet Take 100 mg by mouth at bedtime.       [name] amiodarone (PACERONE) 200 MG tablet Take 1 tablet (200 mg total) by mouth 2 (two) times daily.  60 tablet  0  . atorvastatin (LIPITOR) 40 MG tablet Take 20 mg by mouth every evening. 1/2 tablet daily      . cholecalciferol (VITAMIN D) 1000 UNITS tablet Take 1,000 Units by mouth every morning.       . ferrous sulfate 325 (65 FE) MG tablet Take 325 mg by mouth every evening.      . fish oil-omega-3 fatty acids 1000 MG capsule Take 1 g by mouth 2 (two) times daily.       [name] FLAXSEED, LINSEED, PO Take 1 tablet by mouth at bedtime.       . folic acid (FOLVITE) 400 MCG tablet Take 400 mcg by mouth every morning.       . isosorbide mononitrate (IMDUR) 60 MG 24 hr tablet Take 30 mg by mouth every evening.       . metoprolol succinate  (TOPROL-XL) 25 MG 24 hr tablet Take 25 mg by mouth 2 (two) times daily.      . Tamsulosin HCl (FLOMAX) 0.4 MG CAPS Take 0.4 mg by mouth daily after supper.       . warfarin (COUMADIN) 2.5 MG tablet Take 2.5-5 mg by mouth daily. Take 1 tab (2.5mg ) on Tue, Thur, Sat, Sun and 2 tabs (5mg ) on Mon, Wed, Fri      . nitroGLYCERIN (NITROSTAT) 0.4 MG SL tablet Place 1 tablet (0.4 mg total) under the tongue every 5 (five) minutes as needed for chest pain.  10 tablet  0    Current medications: Current Facility-Administered Medications  Medication Dose Route Frequency Provider Last Rate Last Dose  . acetaminophen (TYLENOL) tablet 650 mg  650 mg Oral Q6H PRN Elease Etienne, MD       Or  . acetaminophen (TYLENOL)  suppository 650 mg  650 mg Rectal Q6H PRN Elease Etienne, MD      . albuterol (PROVENTIL) (5 MG/ML) 0.5% nebulizer solution 2.5 mg  2.5 mg Nebulization Q2H PRN Elease Etienne, MD      . allopurinol (ZYLOPRIM) tablet 100 mg  100 mg Oral QHS Elease Etienne, MD      . amiodarone (PACERONE) tablet 200 mg  200 mg Oral BID Elease Etienne, MD   200 mg at 12/10/13 1030  . atorvastatin (LIPITOR) tablet 20 mg  20 mg Oral QPM Elease Etienne, MD      . cefTRIAXone (ROCEPHIN) 1 g in dextrose 5 % 50 mL IVPB  1 g Intravenous QHS Elease Etienne, MD   1 g at 12/10/13 0241  . cholecalciferol (VITAMIN D) tablet 1,000 Units  1,000 Units Oral Daily Elease Etienne, MD   1,000 Units at 12/10/13 1030  . feeding supplement (ENSURE COMPLETE) (ENSURE COMPLETE) liquid 237 mL  237 mL Oral BID BM Lorraine Lax, RD   237 mL at 12/10/13 1355  . feeding supplement (RESOURCE BREEZE) (RESOURCE BREEZE) liquid 1 Container  1 Container Oral BID WC Lorraine Lax, RD      . folic acid (FOLVITE) tablet 1 mg  1,000 mcg Oral Daily Elease Etienne, MD   1 mg at 12/10/13 1030  . nitroGLYCERIN (NITROSTAT) SL tablet 0.4 mg  0.4 mg Sublingual Q5 min PRN Elease Etienne, MD      . omega-3 acid ethyl esters (LOVAZA) capsule  1 g  1 g Oral BID Elease Etienne, MD   1 g at 12/10/13 1030  . ondansetron (ZOFRAN) tablet 4 mg  4 mg Oral Q6H PRN Elease Etienne, MD       Or  . ondansetron (ZOFRAN) injection 4 mg  4 mg Intravenous Q6H PRN Elease Etienne, MD      . sodium chloride 0.9 % injection 3 mL  3 mL Intravenous Q12H Elease Etienne, MD      . tamsulosin (FLOMAX) capsule 0.4 mg  0.4 mg Oral QPC supper Elease Etienne, MD      . Warfarin - Pharmacist Dosing Inpatient   Does not apply [id number] Renae Fickle, MD       Facility-Administered Medications Ordered in Other Encounters  Medication Dose Route Frequency Provider Last Rate Last Dose  . heparin lock flush 100 unit/mL  250 Units Intracatheter PRN Josph Macho, MD      . sodium chloride 0.9 % injection 10 mL  10 mL Intracatheter PRN Josph Macho, MD         No Known Allergies   Past Medical History  Diagnosis Date  . Coronary artery disease   . MI, old     AGE 66  . Ulcerative colitis   . Hyperlipidemia   . Hypertension   . History of nephrolithiasis   . PAF (paroxysmal atrial fibrillation)   . LV dysfunction   . Myocardial infarction   . Anemia   . Blood transfusion   . Arthritis   . CHF (congestive heart failure)   . Mitral regurgitation   . Pneumonia     years ago  . Peripheral vascular disease   . 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 Acute renal failure     "related ? Kidney stone blockage"  . Gout flare     right index finger-tx. prednisone, allopurinol  .  Swelling 05-08-13    rt. base of neck-something recent.  . Cancer 05/29/13    metastatic adenocarcinoma of right neck mass  . Metastatic adenocarcinoma 05/29/13    right supraclavicular area    Past Surgical History  Procedure Laterality Date  . Cardiac catheterization  10/14/2010    EF 30%  . Coronary artery bypass graft  10/24/2010    LIMA TO THE LAD, A SAPHENOUS VEIN GRAFT TO THE DIAGONAL 1 AND 3, AND SAPHENOUS VEIN GRAFT TO THE DISTAL RIGHT CORONARY ARTERY   . Ileostomy  04/23/73  . Colectomy    . Cataract extraction    . Transthoracic echocardiogram  01/16/2011    EF 50%  . Cardiovascular stress test  10/07/2010    EF 33%  . Appendectomy    . Cystostomy  04/22/2012    Procedure: CYSTOSTOMY SUPRAPUBIC;  Surgeon: Garnett Farm, MD;  Location: [facility]S;  Service: Urology;  Laterality: N/A;  CYSTOTOMY WITH MIDLINE INCISION   . Tee without cardioversion  08/28/2012    Procedure: TRANSESOPHAGEAL ECHOCARDIOGRAM (TEE);  Surgeon: [name], MD;  Location: [facility] ENDOSCOPY;  Service: Cardiovascular;  Laterality: N/A;  . Cardioversion  08/28/2012    Procedure: CARDIOVERSION;  Surgeon: [name], MD;  Location: [facility] ENDOSCOPY;  Service: Cardiovascular;  Laterality: N/A;  . Cystoscopy w/ ureteral stent placement Right 05/12/2013    Procedure: CYSTOSCOPY WITH RIGHT RETROGRADE PYELOGRAM/ RIGHT URETERAL STENT PLACEMENT;  Surgeon: Garnett Farm, MD;  Location: [facility] ORS;  Service: Urology;  Laterality: Right;    Family History  Problem Relation Age of Onset  . Colon cancer Mother     colon  . Heart attack Father   . Hypertension Father   . Heart disease Sister   . Hypertension Sister   . Heart disease Brother   . Hypertension Brother   . Heart disease Sister   . Hypertension Sister   . Heart disease Sister   . Hypertension Sister   . Heart disease Brother   . Hypertension Brother   . Heart disease Brother   . Hypertension Brother   . Heart disease Brother   . Hypertension Brother   . Heart disease Brother   . Hypertension Brother   . Heart disease Brother   . Hypertension Brother   . Heart disease Brother   . Hypertension Brother   . Heart disease Brother   . Hypertension Brother   . Stroke Brother     Social History:  reports that he quit smoking about 44 years ago. His smoking use included Cigarettes and Cigars. He has a 15 pack-year smoking history. He does not have any smokeless tobacco history on file. He reports that he does not  drink alcohol or use illicit drugs.   Review of Systems: Constitutional:  admits to poor appetite, has progressive  fatigue.  HEENT: denies photophobia, eye pain, redness, hearing loss, ear pain, congestion, sore throat, rhinorrhea, sneezing, neck pain, neck stiffness and tinnitus.  Respiratory: admits to SOB with activity.   Cardiovascular: denies chest pain, palpitations and leg swelling.  Gastrointestinal: denies nausea, vomiting, abdominal pain, diarrhea, constipation, blood in stool.  Genitourinary: denies dysuria, urgency, frequency, hematuria, flank pain and difficulty urinating.  Musculoskeletal: admits to   back pain,     Skin: denies pallor, rash and wound.  Neurological: denies dizziness, seizures, syncope, weakness, light-headedness, numbness and headaches.   Hematological: denies adenopathy, easy bruising, personal or family bleeding history.  Psychiatric/ Behavioral: denies suicidal ideation, mood changes, confusion,  nervousness, sleep disturbance and agitation.    Physical Exam: BP 107/77  Pulse 104  Temp(Src) 97.4 F (36.3 C) (Oral)  Resp 21  Ht 5\' 4"  (1.626 m)  Wt 134 lb 11.2 oz (61.1 kg)  BMI 23.11 kg/m2  SpO2 95%  General: Vital signs reviewed and noted. Chronically ill appearing gentleman  Head: Normocephalic, atraumatic, sclera anicteric, mucus membranes are moist  Neck: Supple. Negative for carotid bruits. JVD not elevated.  Lungs:  Clear bilaterally to auscultation without wheezes, rales, or rhonchi. Breathing is normal   Heart: Irreg. Irreg.   Abdomen:  Soft, non-tender, colostomy  MSK: Strength and the appear normal for age.  Extremities: No clubbing or cyanosis. No edema.  Distal pedal pulses are 2+ and equal bilaterally.  Neurologic: Alert and oriented X 3. Moves all extremities spontaneously.  Psych: Responds to questions appropriately with a normal affect.    Lab results: Basic Metabolic Panel:  Recent Labs Lab 12/09/13 1840 12/10/13 0615    NA 133* 136  K 5.4* 4.7  CL 105 108  CO2 16* 17*  GLUCOSE 75 82  BUN 48* 46*  CREATININE 2.51* 2.41*  CALCIUM 9.0 8.9    Liver Function Tests:  Recent Labs Lab 12/10/13 0615  AST 19  ALT 9  ALKPHOS 67  BILITOT 0.4  PROT 5.3*  ALBUMIN 1.9*   No results found for this basename: LIPASE, AMYLASE,  in the last 168 hours No results found for this basename: AMMONIA,  in the last 168 hours  CBC:  Recent Labs Lab 12/09/13 1840 12/10/13 0615  WBC 6.9 6.2  NEUTROABS 5.9  --   HGB 8.9* 9.1*  HCT 27.5* 27.9*  MCV 99.6 101.1*  PLT 72* 67*    Cardiac Enzymes:  Recent Labs Lab 12/10/13 1308  TROPONINI <0.30    BNP: No components found with this basename: POCBNP,   CBG: No results found for this basename: GLUCAP,  in the last 168 hours  Coagulation Studies:  Recent Labs  12/09/13 1840 12/10/13 0615  LABPROT 34.6* 36.9*  INR 3.61* 3.92*     Other results:  Tele:  Atrial fib at rate of 104   Imaging: Dg Chest 2 View  12/09/2013   CLINICAL DATA:  History lung cancer  EXAM: CHEST  2 VIEW  COMPARISON:  PET-CT 10/29/2013  FINDINGS: There is a left-sided Port-A-Cath with the tip projecting over the SVC. There is right paratracheal soft tissue prominence in the right supraclavicular and infraclavicular region with leftward deviation of the trachea. There is no focal consolidation, pleural effusion or pneumothorax. The heart and mediastinal contours are unremarkable.  The osseous structures are unremarkable.  IMPRESSION: No active cardiopulmonary disease.  Right upper paratracheal soft tissue prominence better visualized on prior PET-CT of 10/29/2013.   Electronically Signed   By: Elige Ko   On: 12/09/2013 17:50   Nm Pulmonary Perf And Vent  12/09/2013   CLINICAL DATA:  Chest pain, shortness of breath, history lung cancer, renal dysfunction  EXAM: NUCLEAR MEDICINE VENTILATION - PERFUSION LUNG SCAN  TECHNIQUE: Ventilation images were obtained in multiple  projections using inhaled aerosol technetium 99 M DTPA. Perfusion images were obtained in multiple projections after intravenous injection of Tc-57m MAA.  COMPARISON:  None  Correlation:  Chest radiograph 12/09/2013  RADIOPHARMACEUTICALS:  40.3 mCi Tc-58m DTPA aerosol and 4.9 mCi Tc-61m MAA  FINDINGS: Ventilation: Central airway deposition of aerosol. Patchy diminished ventilation in right upper lobe. Small amount of swallowed aerosol within stomach.  Perfusion:  Much better perfusion than ventilation particularly in right upper lobe. No segmental or subsegmental perfusion defects. Minimally irregularity of perfusion at the lower lobes.  Chest radiograph significant for upper normal heart size post median sternotomy/ CABG, Port-A-Cath, and mild elevation of right diaphragm.  IMPRESSION: Very low probability for pulmonary embolism.   Electronically Signed   By: Ulyses Southward M.D.   On: 12/09/2013 22:18    Echo 05/01/13:  Left ventricle: The cavity size was mildly dilated. Wall thickness was increased in a pattern of mild LVH. Systolic function was mildly reduced. The estimated ejection fraction was in the range of 45% to 50%. There was an increased relative contribution of atrial contraction to ventricular filling. - Mitral valve: Mild regurgitation. - Left atrium: The atrium was moderately dilated. - Right atrium: The atrium was mildly to moderately dilated     Assessment & Plan:  1. Atrial fibrillation: The patient presents with atrial fibrillation.  He had atrial fibrillation with a right rapid ventricular response when he was seen by Dr. [name] in the office in early December.  His INR was 1.1  Five  days ago. He was originally scheduled have a transesophageal echocardiogram/cardioversion and 2 days. Unfortunately, he was admitted to the hospital with progressive weakness, poor appetite and was found to have urinary tract infection.  He has stage IV   lung cancer and his overall health seems to  be gradually and steadily declining. He is quite comfortable at rest. He does comment that he gets short of breath when he gets up to go to the bathroom and do other activities.  He's completely asystematic from an atrial fibrillation standpoint. He denies any chest pain he cannot tell that his heartbeat is irregular.  We could do a cardioversion this Friday as previously scheduled although he would need to have a transesophageal echo since his INR has been subtherapeutic within the past month.    I'm Not sure how aggressive we should be in trying to maintain sinus rhythm.  He is completely asymptomatic. I suspect that his long-term prognosis from metastatic cancer he is fairly poor and I suspect that keeping him in atrial fibrillation with a mildly elevated ventricular response is a relatively insignificant issue. I will discuss the case with Dr. [name].   2. Chronic systolic congestive heart failure: Clinically I think that he was probably a little bit dehydrated. His pro BNP was markedly elevated it should be noted that( according to the pathologists)  the pro BNP level is not accurate in the setting of renal insufficiency.  His lungs were clear on chest x-ray.  3. Metastatic lung cancer:  He's had chemotherapy in the past but apparently did not tolerate the chemotherapy very well. It is my understanding that he would not be receiving any further chemotherapy.    Vesta Mixer, Montez Hageman., MD, [facility] 12/10/2013, 2:21 PM

## 2013-12-10 NOTE — Evaluation (Signed)
Physical Therapy Evaluation Patient Details Name: [name] [name] MRN: [id number] DOB: 07/07/1930 Today's Date: 12/10/2013 Time: 1450-1506 PT Time Calculation (min): 16 min  SATURATION QUALIFICATIONS: (This note is used to comply with regulatory documentation for home oxygen)  Patient Saturations on Room Air at Rest = 98%  Patient Saturations on Room Air while Ambulating = 83%    PT Assessment / Plan / Recommendation History of Present Illness  79 yo male admitted with dehydration, SOB, weakness. Hx of stage IV lung cancer, A fib/flutter, PVD, MI  Clinical Impression  On eval, pt required Min assist for mobility-able to ambulate ~140 feet with walker. Demonstrates decreased activity tolerance and strength. Dyspnea 3/4 with activity. Pt could benefit from 4 wheeled walker for community ambulation due to decreased activity tolerance. Seat on walker would be beneficial to allow for rest breaks.     PT Assessment  Patient needs continued PT services    Follow Up Recommendations  Home health PT;Supervision/Assistance - 24 hour    Does the patient have the potential to tolerate intense rehabilitation      Barriers to Discharge        Equipment Recommendations  4 wheeled rolling walker/rollator   Recommendations for Other Services OT consult   Frequency Min 3X/week    Precautions / Restrictions Precautions Precautions: Fall Restrictions Weight Bearing Restrictions: No   Pertinent Vitals/Pain No c/o pain      Mobility  Bed Mobility Bed Mobility: Supine to Sit;Sit to Supine Supine to Sit: 4: Min assist Sit to Supine: 4: Min guard Details for Bed Mobility Assistance: Assist for trunk to upright Transfers Transfers: Sit to Stand;Stand to Sit Sit to Stand: 4: Min guard;From bed Stand to Sit: 4: Min guard;To bed Details for Transfer Assistance: VCs safety, hand placement Ambulation/Gait Ambulation/Gait Assistance: 4: Min guard Ambulation Distance (Feet): 140  Feet Assistive device: Rolling walker Ambulation/Gait Assistance Details: O2 sats 83% on RA during ambulation. Dyspnea 2/4.  Gait Pattern: Step-through pattern Stairs: No    Exercises     PT Diagnosis: Difficulty walking  PT Problem List: Decreased activity tolerance;Decreased mobility PT Treatment Interventions: Gait training;Functional mobility training;Therapeutic activities;Therapeutic exercise;Patient/family education     PT Goals(Current goals can be found in the care plan section) Acute Rehab PT Goals Patient Stated Goal: home. breathe better PT Goal Formulation: With patient/family Time For Goal Achievement: 12/24/13 Potential to Achieve Goals: Good  Visit Information  Last PT Received On: 12/10/13 Assistance Needed: +1 History of Present Illness: 79 yo male admitted with dehydration, SOB, weakness. Hx of stage IV lung cancer, A fib/flutter, PVD, MI       Prior Functioning  Home Living Family/patient expects to be discharged to:: Private residence Living Arrangements: Spouse/significant other Available Help at Discharge: Family Type of Home:  (townhome) Home Access: Stairs to enter [employer] of Steps: 1 small threshold Entrance Stairs-Rails: None Home Layout: Two level;Able to live on main level with bedroom/bathroom Home Equipment: None Additional Comments: using wife's walker for now Prior Function Level of Independence: Independent with assistive device(s) Communication Communication: No difficulties    Cognition  Cognition Arousal/Alertness: Awake/alert Behavior During Therapy: WFL for tasks assessed/performed Overall Cognitive Status: Within Functional Limits for tasks assessed    Extremity/Trunk Assessment Upper Extremity Assessment Upper Extremity Assessment: Generalized weakness Lower Extremity Assessment Lower Extremity Assessment: Generalized weakness Cervical / Trunk Assessment Cervical / Trunk Assessment: Normal   Balance     End of Session PT - End of Session Activity Tolerance: Patient limited by  fatigue Patient left: in bed;with call bell/phone within reach;with family/visitor present  GP     Rebeca Alert, MPT Pager: [phone]

## 2013-12-10 NOTE — Consult Note (Signed)
I saw Mr. Schnepf today. I'll leave a full consult note later.  He has a very high pro-BNP.    He has metastatic adenocarcinoma of the lung. I don't think this is an issue right now.   He is anemic. He has renal failure. This is chronic. I will give him Aranesp today.  He is due for cardioversion on Friday. Dr. [name] sees him for this. Please let him know that Mr. Michael is in the hospital.  I did discuss CODE STATUS with him. He does not want to be resuscitated or placed on life support if he has a critical event. He understands that he has an incurable cancer. He has marginal cardiac function. If he were to go on life support, he would not come off. He understands this completely. He does not want to the kept alive and exist and have his family have to make the decision to take him off life support. As such, he is a DO NOT RESUSCITATE.  I told him that I would still do what needed to be done for his quality of life. He appreciates this.  We will follow along.  Hewitt Shorts

## 2013-12-10 NOTE — Progress Notes (Signed)
ANTICOAGULATION CONSULT NOTE - Follow Up Consult  Pharmacy Consult for warfarin Indication: atrial fibrillation  No Known Allergies  Patient Measurements: Height: 5\' 4"  (162.6 cm) Weight: 134 lb 11.2 oz (61.1 kg) IBW/kg (Calculated) : 59.2 Heparin Dosing Weight:   Vital Signs: Temp: 97.4 F (36.3 C) (12/17 0510) Temp src: Oral (12/17 0510) BP: 107/77 mmHg (12/17 0513) Pulse Rate: 104 (12/17 0510)  Labs:  Recent Labs  12/09/13 1840 12/10/13 0615  HGB 8.9* 9.1*  HCT 27.5* 27.9*  PLT 72* 67*  LABPROT 34.6* 36.9*  INR 3.61* 3.92*  CREATININE 2.51* 2.41*    Estimated Creatinine Clearance: 20.5 ml/min (by C-G formula based on Cr of 2.41).   Assessment: [name] [name] admitted with weakness, pharmacy consulted to resume coumadin dosing for history of afib.   Home warfarin dose (per coumadin clinic 12/12): dose increased to 2.5mg  daily with 5mg  MWF, per medication history - patient stated was taking 5mg  daily with 2.5mg  TuThSa  Today's INR - 3.92  CBC: Hgb = 9.1 platelets = 67 (chronic thrombocytopenia)  Goal of Therapy:  INR 2-3 Monitor platelets by anticoagulation protocol: Yes   Plan:   Hold warfarin for SUPRAtherapeutic INR  Check daily INR  Monitor for bleeding, watch platelets  [name], PharmD, BCPS.   Pager: [phone]  12/10/2013,8:43 AM

## 2013-12-10 NOTE — Progress Notes (Signed)
[facility] PROGRESS NOTE  [name] [name] ZOX:[id number] DOB: 11/18/1930 DOA: 12/09/2013 PCP: [name] Gasser, MD  Medical Oncology: Dr. Arlan [name].  Urology: Dr. Vernie [name].  Nephrology: Dr. Annie [name]  Cardiology: Dr. Marca [name]   Assessment/Plan  Dehydration with hyponatremia/hypotension/generalized weakness  - Likely multifactorial secondary to possible UTI, poor oral intake & high output ileostomy  - Continue to hold diuretics  Hypertension, blood pressures continue to be mildly low - Continue to hold metoprolol and Imdur.   Possible UTI.  Continue ceftriaxone -  Followup urine culture  Mild hyperkalemia, resolved with IV fluids and holding potassium supplements, spurlike -  Did receive one dose of Kayexalate  Hypoxia, secondary to atelectasis, failure to thrive, progressive lung cancer. Agree that the patient is not overtly in heart failure, despite his elevated BNP. His CT scan demonstrated no evidence of pulmonary embolism, however he did have some poor ventilation and some of the upper lobe areas. - Oxygen supplementation.  -  Incentive spirometry and out of bed  Paroxysmal A. fib/A. Flutter  -  Cardiology consult, plan to talk to [name] [name] about the possibility of doing a cardioversion while patient is in hospital. He will need a transesophageal echocardiogram prior to the procedure, because he has been under coagulated earlier this month.  There is a question as to whether the patient would benefit from undergoing cardioversion as he is relatively asymptomatic in the setting of his atrial fibrillation.  - Coumadin-management per pharmacy.   CAD/CABG/ischemic cardiomyopathy/chronic systolic CHF  - Many of his medications have been held due to recent episode of hypotension, AKI and hyperkalemia including spinal lactone, Lasix, ACE inhibitors, hydralazine, Coreg -   restart his blood pressure and kidney function allow   Chronic kidney disease  stage IV,  creatinine near baseline.  -  renally dosed medications and minimize nephrotoxins .  Anemia and thrombocytopenia  platelets and hemoglobin approximately stable, likely secondary to underlying malignancy  .  Stage IV adenocarcinoma lung  -   appreciate Dr. Gustavo [name] assistance, including code status conversation  Non-anion gap metabolic acidosis  - Likely secondary to high output ileostomy. stable   Failure to thrive, evaluated by physical therapy who recommended home health physical therapy with 24-hour assistance. They also recommended a rolling walker  Severe protein calorie malnutrition, regular diet, continue supplements, appreciate nutrition recommendations   Diet:   regular Access:   port IVF:   no Proph:   warfarin   Code Status:  DO NOT RESUSCITATE Family Communication:  spoke to patient and multiple family members including daughter  Disposition Plan:  pending results of urine culture, the patient feeling symptomatically better, blood pressure somewhat improved so we can restart him at his home heart failure and blood pressure medications.  If he is continuing to do poorly, consideration of home hospice if patient's oncologist amenable.   Consultants:   cardiology    oncology  Procedures:   chest x-ray    VQ scan  Antibiotics:   ceftriaxone from 12/17    HPI/Subjective:   patient states that he is still quite fatigued and [name] [name] of breath with minimal exertion. When walking with physical therapy, his oxygen levels dropped to the low to mid 80s on room air.     Objective: Filed Vitals:   12/10/13 0512 12/10/13 0513 12/10/13 1448 12/10/13 1500  BP: 102/80 107/77 99/77   Pulse:   110   Temp:   98.2 F (36.8 C)   TempSrc:   Oral   Resp:  20   Height:      Weight:      SpO2:   97% 83%    Intake/Output Summary (Last 24 hours) at 12/10/13 1832 Last data filed at 12/10/13 1700  Gross per 24 hour  Intake    619 ml  Output    200 ml  Net    419  ml   Filed Weights   12/10/13 0216  Weight: 61.1 kg (134 lb 11.2 oz)    Exam:   General:   cachectic Caucasian male, No acute distress  HEENT:  NCAT, MMM  Cardiovascular:  IRRR, nl S1, S2 no mrg, 2+ pulses, warm extremities  Respiratory:  Diminished bilateral breath sounds, but otherwise clear to auscultation, no wheezes, rales, rhonchi, no increased WOB  Abdomen:   NABS, soft, NT/ND  MSK:   decreased tone and bulk, no LEE  Neuro:  Grossly intact  Data Reviewed: Basic Metabolic Panel:  Recent Labs Lab 12/09/13 1840 12/10/13 0615  NA 133* 136  K 5.4* 4.7  CL 105 108  CO2 16* 17*  GLUCOSE 75 82  BUN 48* 46*  CREATININE 2.51* 2.41*  CALCIUM 9.0 8.9   Liver Function Tests:  Recent Labs Lab 12/10/13 0615  AST 19  ALT 9  ALKPHOS 67  BILITOT 0.4  PROT 5.3*  ALBUMIN 1.9*   No results found for this basename: LIPASE, AMYLASE,  in the last 168 hours No results found for this basename: AMMONIA,  in the last 168 hours CBC:  Recent Labs Lab 12/09/13 1840 12/10/13 0615  WBC 6.9 6.2  NEUTROABS 5.9  --   HGB 8.9* 9.1*  HCT 27.5* 27.9*  MCV 99.6 101.1*  PLT 72* 67*   Cardiac Enzymes:  Recent Labs Lab 12/10/13 1308  TROPONINI <0.30   BNP (last 3 results)  Recent Labs  03/05/13 1208 12/09/13 1840  PROBNP 557.0* 31194.0*   CBG: No results found for this basename: GLUCAP,  in the last 168 hours  No results found for this or any previous visit (from the past 240 hour(s)).   Studies: Dg Chest 2 View  12/09/2013   CLINICAL DATA:  History lung cancer  EXAM: CHEST  2 VIEW  COMPARISON:  PET-CT 10/29/2013  FINDINGS: There is a left-sided Port-A-Cath with the tip projecting over the SVC. There is right paratracheal soft tissue prominence in the right supraclavicular and infraclavicular region with leftward deviation of the trachea. There is no focal consolidation, pleural effusion or pneumothorax. The heart and mediastinal contours are unremarkable.  The  osseous structures are unremarkable.  IMPRESSION: No active cardiopulmonary disease.  Right upper paratracheal soft tissue prominence better visualized on prior PET-CT of 10/29/2013.   Electronically Signed   By: [name] [name]   On: 12/09/2013 17:50   Nm Pulmonary Perf And Vent  12/09/2013   CLINICAL DATA:  Chest pain, shortness of breath, history lung cancer, renal dysfunction  EXAM: NUCLEAR MEDICINE VENTILATION - PERFUSION LUNG SCAN  TECHNIQUE: Ventilation images were obtained in multiple projections using inhaled aerosol technetium 99 M DTPA. Perfusion images were obtained in multiple projections after intravenous injection of Tc-14m MAA.  COMPARISON:  None  Correlation:  Chest radiograph 12/09/2013  RADIOPHARMACEUTICALS:  40.3 mCi Tc-50m DTPA aerosol and 4.9 mCi Tc-73m MAA  FINDINGS: Ventilation: Central airway deposition of aerosol. Patchy diminished ventilation in right upper lobe. Small amount of swallowed aerosol within stomach.  Perfusion: Much better perfusion than ventilation particularly in right upper lobe. No segmental or subsegmental perfusion defects.  Minimally irregularity of perfusion at the lower lobes.  Chest radiograph significant for upper normal heart size post median sternotomy/ CABG, Port-A-Cath, and mild elevation of right diaphragm.  IMPRESSION: Very low probability for pulmonary embolism.   Electronically Signed   By: Ulyses Southward M.D.   On: 12/09/2013 22:18    Scheduled Meds: . allopurinol  100 mg Oral QHS  . amiodarone  200 mg Oral BID  . atorvastatin  20 mg Oral QPM  . cefTRIAXone (ROCEPHIN)  IV  1 g Intravenous QHS  . cholecalciferol  1,000 Units Oral Daily  . feeding supplement (ENSURE COMPLETE)  237 mL Oral BID BM  . feeding supplement (RESOURCE BREEZE)  1 Container Oral BID WC  . folic acid  1,000 mcg Oral Daily  . omega-3 acid ethyl esters  1 g Oral BID  . sodium chloride  3 mL Intravenous Q12H  . tamsulosin  0.4 mg Oral QPC supper  . Warfarin - Pharmacist Dosing  Inpatient   Does not apply q1800   Continuous Infusions:   Principal Problem:   Dehydration Active Problems:   CAD   CKD (chronic kidney disease)   Paroxysmal atrial fibrillation   Metastatic adenocarcinoma   Anemia of chronic disease   Chronic systolic CHF (congestive heart failure)   High output ileostomy-CHRONIC   Protein-calorie malnutrition, severe   Cardiomyopathy, ischemic   Shortness of breath   Hyperkalemia   Hypotension   UTI (lower urinary tract infection)   Hypoxia    Time spent: 30 min    Stephanye Finnicum  [facility] Pager [phone]. If 7PM-7AM, please contact night-coverage at [email or web address], password [facility] 12/10/2013, 6:32 PM  LOS: 1 day

## 2013-12-10 NOTE — Progress Notes (Signed)
INITIAL NUTRITION ASSESSMENT  DOCUMENTATION CODES Per approved criteria  -Severe malnutrition in the context of acute illness or injury  Pt meet criteria for SEVERE MALNUTRITION in the context of ACUTE ILLNESS as evidenced by 4% weight loss in less than one week and estimated energy intake <50% of estimated energy needs for > 5 days.  INTERVENTIOsN: Provide Ensure Complete BID Provide Resource Breeze BID Encourage PO intake  NUTRITION DIAGNOSIS: Inadequate oral intake related to poor appetite as evidenced by pt's report of eating < 1 meal daily for the past week and 4% weight loss in the past week.   Goal: Pt to meet >/= 90% of their estimated nutrition needs   Monitor:  PO intake Weight Labs  Reason for Assessment: Consult/ MST  79 y.o. male  Admitting Dx: Dehydration  ASSESSMENT: 79 y.o. male with extensive PMH-stage IV adenocarcinoma of lung-chemotherapy discontinued because he could not tolerate, CAD, status post CABG, ulcerative colitis status post colectomy, high-output ileostomy, atrial fibrillation status post prior failed cardioversion- on Coumadin, chronic systolic CHF with LVEF 45-50%, hyperlipidemia, mitral regurgitation, chronic kidney disease presented to the ED with complaints of generalized weakness. Patient gives one-week history of lack of taste, progressively decreased appetite, generalized weakness, dizziness when he gets up, chills at night but no fevers. The symptoms seem to have worsened in the last 1-2 days. Pt states that for 3-4 weeks he got his appetite back, his taste back, and he was eating great but, for the past week he has had no appetite and has been eating less than one meal daily. Pt reports weighing 215 lbs 4 years ago before CABG, weighing 155 lbs this past year and around 140 lbs the past couple months. Weight history shows 4% weight loss in the past week.    Height: Ht Readings from Last 1 Encounters:  12/10/13 5\' 4"  (1.626 m)     Weight: Wt Readings from Last 1 Encounters:  12/10/13 134 lb 11.2 oz (61.1 kg)    Ideal Body Weight: 130 lbs  % Ideal Body Weight: 103%  Wt Readings from Last 10 Encounters:  12/10/13 134 lb 11.2 oz (61.1 kg)  12/05/13 140 lb (63.504 kg)  12/01/13 137 lb (62.143 kg)  11/06/13 140 lb (63.504 kg)  11/05/13 140 lb (63.504 kg)  10/29/13 144 lb (65.318 kg)  10/28/13 145 lb (65.772 kg)  10/22/13 122 lb 8 oz (55.566 kg)  10/11/13 142 lb 4.8 oz (64.547 kg)  10/01/13 141 lb (63.957 kg)    Usual Body Weight: 155 lbs  % Usual Body Weight: 87%  BMI:  Body mass index is 23.11 kg/(m^2).  Estimated Nutritional Needs: Kcal: 1500-1700 Protein: 50-60 grams Fluid: 1.5-1.7 L/day  Skin: intact  Diet Order: General  EDUCATION NEEDS: -No education needs identified at this time   Intake/Output Summary (Last 24 hours) at 12/10/13 1033 Last data filed at 12/10/13 0700  Gross per 24 hour  Intake    259 ml  Output      0 ml  Net    259 ml    Last BM: 12/17  Labs:   Recent Labs Lab 12/09/13 1840 12/10/13 0615  NA 133* 136  K 5.4* 4.7  CL 105 108  CO2 16* 17*  BUN 48* 46*  CREATININE 2.51* 2.41*  CALCIUM 9.0 8.9  GLUCOSE 75 82    CBG (last 3)  No results found for this basename: GLUCAP,  in the last 72 hours  Scheduled Meds: . allopurinol  100 mg Oral  QHS  . amiodarone  200 mg Oral BID  . atorvastatin  20 mg Oral QPM  . cefTRIAXone (ROCEPHIN)  IV  1 g Intravenous QHS  . cholecalciferol  1,000 Units Oral Daily  . folic acid  1,000 mcg Oral Daily  . omega-3 acid ethyl esters  1 g Oral BID  . sodium chloride  3 mL Intravenous Q12H  . tamsulosin  0.4 mg Oral QPC supper  . Warfarin - Pharmacist Dosing Inpatient   Does not apply q1800    Continuous Infusions: . sodium chloride Stopped (12/10/13 1029)    Past Medical History  Diagnosis Date  . Coronary artery disease   . MI, old     AGE 22  . Ulcerative colitis   . Hyperlipidemia   . Hypertension   .  History of nephrolithiasis   . PAF (paroxysmal atrial fibrillation)   . LV dysfunction   . Myocardial infarction   . Anemia   . Blood transfusion   . Arthritis   . CHF (congestive heart failure)   . Mitral regurgitation   . Pneumonia     years ago  . Peripheral vascular disease   . 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 Acute renal failure     "related ? Kidney stone blockage"  . Gout flare     right index finger-tx. prednisone, allopurinol  . Swelling 05-08-13    rt. base of neck-something recent.  . Cancer 05/29/13    metastatic adenocarcinoma of right neck mass  . Metastatic adenocarcinoma 05/29/13    right supraclavicular area    Past Surgical History  Procedure Laterality Date  . Cardiac catheterization  10/14/2010    EF 30%  . Coronary artery bypass graft  10/24/2010    LIMA TO THE LAD, A SAPHENOUS VEIN GRAFT TO THE DIAGONAL 1 AND 3, AND SAPHENOUS VEIN GRAFT TO THE DISTAL RIGHT CORONARY ARTERY  . Ileostomy  04/23/73  . Colectomy    . Cataract extraction    . Transthoracic echocardiogram  01/16/2011    EF 50%  . Cardiovascular stress test  10/07/2010    EF 33%  . Appendectomy    . Cystostomy  04/22/2012    Procedure: CYSTOSTOMY SUPRAPUBIC;  Surgeon: Garnett Farm, MD;  Location: [facility] ORS;  Service: Urology;  Laterality: N/A;  CYSTOTOMY WITH MIDLINE INCISION   . Tee without cardioversion  08/28/2012    Procedure: TRANSESOPHAGEAL ECHOCARDIOGRAM (TEE);  Surgeon: [name], MD;  Location[facility] ENDOSCOPY;  Service: Cardiovascular;  Laterality: N/A;  . Cardioversion  08/28/2012    Procedure: CARDIOVERSION;  Surgeon: [name], MD;  Location: [facility] ENDOSCOPY;  Service: Cardiovascular;  Laterality: N/A;  . Cystoscopy w/ ureteral stent placement Right 05/12/2013    Procedure: CYSTOSCOPY WITH RIGHT RETROGRADE PYELOGRAM/ RIGHT URETERAL STENT PLACEMENT;  Surgeon: Garnett Farm, MD;  Location: [facility] ORS;  Service: Urology;  Laterality: Right;    Ian Malkin RD,  LDN Inpatient Clinical Dietitian Pager: [phone] After Hours Pager: [phone]

## 2013-12-10 NOTE — Progress Notes (Signed)
Echo Lab  2D Echocardiogram completed.  [name] [name], RDCS 12/10/2013 1:50 PM

## 2013-12-10 NOTE — Progress Notes (Signed)
ANTICOAGULATION CONSULT NOTE - Initial Consult  Pharmacy Consult for warfarin Indication: atrial fibrillation  No Known Allergies  Patient Measurements: Height: 5\' 4"  (162.6 cm) Weight: 134 lb 11.2 oz (61.1 kg) IBW/kg (Calculated) : 59.2 Heparin Dosing Weight:   Vital Signs: Temp: 97.4 F (36.3 C) (12/17 0510) Temp src: Oral (12/17 0510) BP: 107/77 mmHg (12/17 0513) Pulse Rate: 104 (12/17 0510)  Labs:  Recent Labs  12/09/13 1840  HGB 8.9*  HCT 27.5*  PLT 72*  LABPROT 34.6*  INR 3.61*  CREATININE 2.51*    Estimated Creatinine Clearance: 19.7 ml/min (by C-G formula based on Cr of 2.51).   Medical History: Past Medical History  Diagnosis Date  . Coronary artery disease   . MI, old     AGE 79  . Ulcerative colitis   . Hyperlipidemia   . Hypertension   . History of nephrolithiasis   . PAF (paroxysmal atrial fibrillation)   . LV dysfunction   . Myocardial infarction   . Anemia   . Blood transfusion   . Arthritis   . CHF (congestive heart failure)   . Mitral regurgitation   . Pneumonia     years ago  . Peripheral vascular disease   . 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 Acute renal failure     "related ? Kidney stone blockage"  . Gout flare     right index finger-tx. prednisone, allopurinol  . Swelling 05-08-13    rt. base of neck-something recent.  . Cancer 05/29/13    metastatic adenocarcinoma of right neck mass  . Metastatic adenocarcinoma 05/29/13    right supraclavicular area    Medications:  Prescriptions prior to admission  Medication Sig Dispense Refill  . acetaminophen (TYLENOL) 500 MG tablet Take 500 mg by mouth every 6 (six) hours as needed for moderate pain.      [name] allopurinol (ZYLOPRIM) 100 MG tablet Take 100 mg by mouth at bedtime.       [name] amiodarone (PACERONE) 200 MG tablet Take 1 tablet (200 mg total) by mouth 2 (two) times daily.  60 tablet  0  . atorvastatin (LIPITOR) 40 MG tablet Take 20 mg by mouth every evening. 1/2  tablet daily      . cholecalciferol (VITAMIN D) 1000 UNITS tablet Take 1,000 Units by mouth every morning.       . ferrous sulfate 325 (65 FE) MG tablet Take 325 mg by mouth every evening.      . fish oil-omega-3 fatty acids 1000 MG capsule Take 1 g by mouth 2 (two) times daily.       [name] FLAXSEED, LINSEED, PO Take 1 tablet by mouth at bedtime.       . folic acid (FOLVITE) 400 MCG tablet Take 400 mcg by mouth every morning.       . isosorbide mononitrate (IMDUR) 60 MG 24 hr tablet Take 30 mg by mouth every evening.       . metoprolol succinate (TOPROL-XL) 25 MG 24 hr tablet Take 25 mg by mouth 2 (two) times daily.      . Tamsulosin HCl (FLOMAX) 0.4 MG CAPS Take 0.4 mg by mouth daily after supper.       . warfarin (COUMADIN) 5 MG tablet Take 2.5-5 mg by mouth every evening. Take 1 tablet (5 mg) on Sunday, Monday, Wednesday, Friday. Take 0.5 tablet (2.5 mg) on Tuesday, Thursday, Saturday.      . nitroGLYCERIN (NITROSTAT) 0.4 MG SL tablet  Place 1 tablet (0.4 mg total) under the tongue every 5 (five) minutes as needed for chest pain.  10 tablet  0    Assessment: Patient with chronic warfarin for afib.  INR >3 on admit.    Goal of Therapy:  INR 2-3    Plan:  Check PT/INR daily.  Follow up with 12/17 am INR when results Dose warfarin if needed today.   Darlina Guys, Jacquenette Shone Crowford 12/10/2013,6:10 AM

## 2013-12-10 NOTE — H&P (Signed)
[facility]  History and Physical  Anant Agard WJX:[id number] DOB: [birth year] DOA: 12/09/2013  Referring physician: EDP PCP: [name], MD  Outpatient Specialists:  1. Medical Oncology: Dr. Arlan Organ. 2. Urology: Dr. [name]. 3. Nephrology: Dr. Annie Sable 4. Cardiology: Dr. [name]  Chief Complaint: Generalized weakness  HPI: [name] [name] is a 79 y.o. male with extensive PMH-stage IV adenocarcinoma of lung-chemotherapy discontinued because he could not tolerate, CAD, status post CABG, ulcerative colitis status post colectomy, high-output ileostomy, atrial fibrillation status post prior failed cardioversion- on Coumadin, chronic systolic CHF with LVEF 45-50%, hyperlipidemia, mitral regurgitation, chronic kidney disease presented to the ED with complaints of generalized weakness. Patient gives one-week history of lack of taste, progressively decreased appetite, generalized weakness, dizziness when he gets up, chills at night but no fevers. The symptoms seem to have worsened in the last 1-2 days. He has chronic dyspnea, mostly on exertion which he states is unchanged. He denies headache, earache, sore throat, chest pain, orthopnea, cough, nausea, vomiting, abdominal pain. He does complain of intermittent runny nose ongoing for the last 6 weeks. Output and colostomy bag also seems watery but states that that is because he has not been eating much. In the ED, blood pressures are soft in the 90s, sodium 133, potassium 5.4, bicarbonate 16, BUN 48, creatinine 2.51, hemoglobin 8.9, platelets 72, pro BNP 31194, chest x-ray without acute findings and VQ scan read as very low probability for pulmonary embolism. In the ED, patient received IV fluid bolus 500 mL times one, orthostatics were negative. EDP discussed his case with Dr. Ladona Ridgel, cardiologist on call who recommended discharge and followup in the cardiology clinic in the a.m. Patient was given a dose of Kayexalate. He  was ambulated and his oxygen saturation dropped to 82%. Hospitalist admission was requested.   Review of Systems: All systems reviewed and apart from history of presenting illness, are negative.  Past Medical History  Diagnosis Date  . Coronary artery disease   . MI, old     AGE 60  . Ulcerative colitis   . Hyperlipidemia   . Hypertension   . History of nephrolithiasis   . PAF (paroxysmal atrial fibrillation)   . LV dysfunction   . Myocardial infarction   . Anemia   . Blood transfusion   . Arthritis   . CHF (congestive heart failure)   . Mitral regurgitation   . Pneumonia     years ago  . Peripheral vascular disease   . 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 Acute renal failure     "related ? Kidney stone blockage"  . Gout flare     right index finger-tx. prednisone, allopurinol  . Swelling 05-08-13    rt. base of neck-something recent.  . Cancer 05/29/13    metastatic adenocarcinoma of right neck mass  . Metastatic adenocarcinoma 05/29/13    right supraclavicular area   Past Surgical History  Procedure Laterality Date  . Cardiac catheterization  10/14/2010    EF 30%  . Coronary artery bypass graft  10/24/2010    LIMA TO THE LAD, A SAPHENOUS VEIN GRAFT TO THE DIAGONAL 1 AND 3, AND SAPHENOUS VEIN GRAFT TO THE DISTAL RIGHT CORONARY ARTERY  . Ileostomy  04/23/73  . Colectomy    . Cataract extraction    . Transthoracic echocardiogram  01/16/2011    EF 50%  . Cardiovascular stress test  10/07/2010    EF 33%  . Appendectomy    .  Cystostomy  04/22/2012    Procedure: CYSTOSTOMY SUPRAPUBIC;  Surgeon: Garnett Farm, MD;  Location: [facility];  Service: Urology;  Laterality: N/A;  CYSTOTOMY WITH MIDLINE INCISION   . Tee without cardioversion  08/28/2012    Procedure: TRANSESOPHAGEAL ECHOCARDIOGRAM (TEE);  Surgeon: [name], MD;  Location: [facility] ENDOSCOPY;  Service: Cardiovascular;  Laterality: N/A;  . Cardioversion  08/28/2012    Procedure: CARDIOVERSION;  Surgeon: [name], MD;  Location: [facility] ENDOSCOPY;  Service: Cardiovascular;  Laterality: N/A;  . Cystoscopy w/ ureteral stent placement Right 05/12/2013    Procedure: CYSTOSCOPY WITH RIGHT RETROGRADE PYELOGRAM/ RIGHT URETERAL STENT PLACEMENT;  Surgeon: Garnett Farm, MD;  Location: [facility];  Service: Urology;  Laterality: Right;   Social History:  reports that he quit smoking about 44 years ago. His smoking use included Cigarettes and Cigars. He has a 15 pack-year smoking history. He does not have any smokeless tobacco history on file. He reports that he does not drink alcohol or use illicit drugs. Married. Independent of activities of daily living.  No Known Allergies  Family History  Problem Relation Age of Onset  . Colon cancer Mother     colon  . Heart attack Father   . Hypertension Father   . Heart disease Sister   . Hypertension Sister   . Heart disease Brother   . Hypertension Brother   . Heart disease Sister   . Hypertension Sister   . Heart disease Sister   . Hypertension Sister   . Heart disease Brother   . Hypertension Brother   . Heart disease Brother   . Hypertension Brother   . Heart disease Brother   . Hypertension Brother   . Heart disease Brother   . Hypertension Brother   . Heart disease Brother   . Hypertension Brother   . Heart disease Brother   . Hypertension Brother   . Heart disease Brother   . Hypertension Brother   . Stroke Brother     Prior to Admission medications   Medication Sig Start Date End Date Taking? Authorizing Provider  acetaminophen (TYLENOL) 500 MG tablet Take 500 mg by mouth every 6 (six) hours as needed for moderate pain.   Yes Historical Provider, MD  allopurinol (ZYLOPRIM) 100 MG tablet Take 100 mg by mouth at bedtime.    Yes Historical Provider, MD  amiodarone (PACERONE) 200 MG tablet Take 1 tablet (200 mg total) by mouth 2 (two) times daily. 12/01/13  Yes [name], MD  atorvastatin (LIPITOR) 40 MG tablet Take 20 mg by mouth every  evening. 1/2 tablet daily 07/21/13  Yes [name], MD  cholecalciferol (VITAMIN D) 1000 UNITS tablet Take 1,000 Units by mouth every morning.    Yes Historical Provider, MD  ferrous sulfate 325 (65 FE) MG tablet Take 325 mg by mouth every evening.   Yes Historical Provider, MD  fish oil-omega-3 fatty acids 1000 MG capsule Take 1 g by mouth 2 (two) times daily.    Yes Historical Provider, MD  FLAXSEED, LINSEED, PO Take 1 tablet by mouth at bedtime.    Yes Historical Provider, MD  folic acid (FOLVITE) 400 MCG tablet Take 400 mcg by mouth every morning.    Yes Historical Provider, MD  isosorbide mononitrate (IMDUR) 60 MG 24 hr tablet Take 30 mg by mouth every evening.    Yes Historical Provider, MD  metoprolol succinate (TOPROL-XL) 25 MG 24 hr tablet Take 25 mg by mouth 2 (two)  times daily.   Yes Historical Provider, MD  Tamsulosin HCl (FLOMAX) 0.4 MG CAPS Take 0.4 mg by mouth daily after supper.    Yes Historical Provider, MD  warfarin (COUMADIN) 5 MG tablet Take 2.5-5 mg by mouth every evening. Take 1 tablet (5 mg) on Sunday, Monday, Wednesday, Friday. Take 0.5 tablet (2.5 mg) on Tuesday, Thursday, Saturday.   Yes Historical Provider, MD  cephALEXin (KEFLEX) 500 MG capsule Take 1 capsule (500 mg total) by mouth 2 (two) times daily. 12/09/13   Shon Baton, MD  nitroGLYCERIN (NITROSTAT) 0.4 MG SL tablet Place 1 tablet (0.4 mg total) under the tongue every 5 (five) minutes as needed for chest pain. 10/29/13   Agapito Games, MD   Physical Exam: Filed Vitals:   12/09/13 1723 12/09/13 1724 12/09/13 1954 12/09/13 1955  BP: 104/65 104/65 93/66 91/60   Pulse: 99  99   Temp:   98 F (36.7 C)   TempSrc:   Oral   Resp:  16 26   SpO2:   97%      General exam: Moderately built and nourished pleasant male patient, lying comfortably supine on the gurney in no obvious distress.  Head, eyes and ENT: Nontraumatic and normocephalic. Pupils equally reacting to light and accommodation. Oral  mucosa dry.  Neck: Supple. No JVD, carotid bruit or thyromegaly.  Lymphatics: No lymphadenopathy.  Respiratory system: Slightly reduced breath sounds in the bases with coarse leathery/Velcro-like crackles (seen chronic). No increased work of breathing. Able to speak in full sentences. Porta cath in left upper anterior chest.  Cardiovascular system: S1 and S2 heard, RRR. No JVD, murmurs, gallops, clicks or pedal edema.  Gastrointestinal system: Abdomen is nondistended, soft and nontender. Normal bowel sounds heard. No organomegaly or masses appreciated. Ileostomy right lower quadrant with liquid green stools.  Central nervous system: Alert and oriented. No focal neurological deficits.  Extremities: Symmetric 5 x 5 power. Peripheral pulses symmetrically felt.   Skin: No rashes or acute findings. Reduced skin turgor.  Musculoskeletal system: Negative exam.  Psychiatry: Pleasant and cooperative.   Labs on Admission:  Basic Metabolic Panel:  Recent Labs Lab 12/09/13 1840  NA 133*  K 5.4*  CL 105  CO2 16*  GLUCOSE 75  BUN 48*  CREATININE 2.51*  CALCIUM 9.0   Liver Function Tests: No results found for this basename: AST, ALT, ALKPHOS, BILITOT, PROT, ALBUMIN,  in the last 168 hours No results found for this basename: LIPASE, AMYLASE,  in the last 168 hours No results found for this basename: AMMONIA,  in the last 168 hours CBC:  Recent Labs Lab 12/09/13 1840  WBC 6.9  NEUTROABS 5.9  HGB 8.9*  HCT 27.5*  MCV 99.6  PLT 72*   Cardiac Enzymes: No results found for this basename: CKTOTAL, CKMB, CKMBINDEX, TROPONINI,  in the last 168 hours  BNP (last 3 results)  Recent Labs  03/05/13 1208 12/09/13 1840  PROBNP 557.0* 31194.0*   CBG: No results found for this basename: GLUCAP,  in the last 168 hours  Radiological Exams on Admission: Dg Chest 2 View  12/09/2013   CLINICAL DATA:  History lung cancer  EXAM: CHEST  2 VIEW  COMPARISON:  PET-CT 10/29/2013  FINDINGS:  There is a left-sided Port-A-Cath with the tip projecting over the SVC. There is right paratracheal soft tissue prominence in the right supraclavicular and infraclavicular region with leftward deviation of the trachea. There is no focal consolidation, pleural effusion or pneumothorax. The heart and mediastinal contours  are unremarkable.  The osseous structures are unremarkable.  IMPRESSION: No active cardiopulmonary disease.  Right upper paratracheal soft tissue prominence better visualized on prior PET-CT of 10/29/2013.   Electronically Signed   By: Elige Ko   On: 12/09/2013 17:50   Nm Pulmonary Perf And Vent  12/09/2013   CLINICAL DATA:  Chest pain, shortness of breath, history lung cancer, renal dysfunction  EXAM: NUCLEAR MEDICINE VENTILATION - PERFUSION LUNG SCAN  TECHNIQUE: Ventilation images were obtained in multiple projections using inhaled aerosol technetium 99 M DTPA. Perfusion images were obtained in multiple projections after intravenous injection of Tc-41m MAA.  COMPARISON:  None  Correlation:  Chest radiograph 12/09/2013  RADIOPHARMACEUTICALS:  40.3 mCi Tc-40m DTPA aerosol and 4.9 mCi Tc-35m MAA  FINDINGS: Ventilation: Central airway deposition of aerosol. Patchy diminished ventilation in right upper lobe. Small amount of swallowed aerosol within stomach.  Perfusion: Much better perfusion than ventilation particularly in right upper lobe. No segmental or subsegmental perfusion defects. Minimally irregularity of perfusion at the lower lobes.  Chest radiograph significant for upper normal heart size post median sternotomy/ CABG, Port-A-Cath, and mild elevation of right diaphragm.  IMPRESSION: Very low probability for pulmonary embolism.   Electronically Signed   By: Ulyses Southward M.D.   On: 12/09/2013 22:18    EKG: Independently reviewed. Atrial flutter with 2 in 1 block, right bundle branch block (old) in no acute changes.  Assessment/Plan Principal Problem:   Dehydration Active  Problems:   CAD   CKD (chronic kidney disease)   Paroxysmal atrial fibrillation   Metastatic adenocarcinoma   Anemia of chronic disease   Chronic systolic CHF (congestive heart failure)   High output ileostomy-CHRONIC   Protein-calorie malnutrition, severe   Cardiomyopathy, ischemic   Shortness of breath   Hyperkalemia   Hypotension   UTI (lower urinary tract infection)   Dehydration with hyponatremia/hypotension/generalized weakness - Likely multifactorial secondary to possible UTI, poor oral intake & high output ileostomy - Brief gentle IV fluid hydration. Lasix has been held due to recent episode of AKI. He's also of a spinal lactone and ACE inhibitors after recent episode of hyperkalemia and AKI - Temporarily hold metoprolol and Imdur. Resume same medications later this morning if blood pressures are better. - Nutrition consultation.  Possible UTI. - Continue IV Rocephin pending urine culture results.  Mild hyperkalemia - Likely secondary to potassium supplements. Hold same. - No EKG changes. Status post a dose of Kayexalate. Follow BMP in a.m. Gentle IV fluids.  Hypoxia - Likely multifactorial from lung cancer,? Amiodarone toxicity and anemia. Not overtly in CHF at this time. - Oxygen supplementation.  Paroxysmal A. fib/A. Flutter - Currently rate controlled. Monitor on telemetry. - Beta blockers temporarily held secondary to hypotension and can be resumed later today if blood pressures are better. - Coagulopathic from Coumadin-management per pharmacy. - Consult cardiology in the morning: Cardiology was planning to cardiovert patient coming Friday.  CAD/CABG/ischemic cardiomyopathy/chronic systolic CHF - Patient clinically dehydrated at this time. Management as above. - Many of his medications have been held due to recent episode of hypotension, AKI and hyperkalemia including spinal lactone, Lasix, ACE inhibitors, hydralazine, Coreg  Chronic kidney disease stage IV -  Creatinine seems to be at baseline. Follow BMP name post hydration.  Anemia and thrombocytopenia - Hemoglobin is slightly lower than 9.7 on 12/2. Platelets are stable. No active bleeding. Follow CBC in a.m.  Stage IV adenocarcinoma lung - Per patient and cardiologist's recent note-no further chemotherapy.  Non-anion gap metabolic  acidosis - Likely secondary to high output ileostomy. Follow BMP in a.m.  Failure to thrive   Code Status:  Full (patient had been DO NOT RESUSCITATE in the past)  Family Communication:  Discussed with spouse at bedside.  Disposition Plan:  Home when medically stable.   Time spent:  65 minutes  HONGALGI,ANAND, MD, FACP, FHM. [facility] Pager [phone]  If 7PM-7AM, please contact night-coverage [email or web address] Password [email or web address] 12/10/2013, 1:26 AM

## 2013-12-10 NOTE — Progress Notes (Signed)
1600: report received from E. Ali,RN. Will continue current plan of care. Florie Carico Johnson 

## 2013-12-10 NOTE — Progress Notes (Signed)
Call ED for report . Was on hold . Terminated call after holding for several minutes. Will Cal him andl back

## 2013-12-11 DIAGNOSIS — D62 Acute posthemorrhagic anemia: Secondary | ICD-10-CM

## 2013-12-11 DIAGNOSIS — D631 Anemia in chronic kidney disease: Secondary | ICD-10-CM

## 2013-12-11 DIAGNOSIS — C3490 Malignant neoplasm of unspecified part of unspecified bronchus or lung: Secondary | ICD-10-CM

## 2013-12-11 DIAGNOSIS — N189 Chronic kidney disease, unspecified: Secondary | ICD-10-CM

## 2013-12-11 DIAGNOSIS — D638 Anemia in other chronic diseases classified elsewhere: Secondary | ICD-10-CM

## 2013-12-11 LAB — CBC
HCT: 29.2 % — ABNORMAL LOW (ref 39.0–52.0)
Hemoglobin: 9.5 g/dL — ABNORMAL LOW (ref 13.0–17.0)
MCHC: 32.5 g/dL (ref 30.0–36.0)
MCV: 100.7 fL — ABNORMAL HIGH (ref 78.0–100.0)
RBC: 2.9 MIL/uL — ABNORMAL LOW (ref 4.22–5.81)

## 2013-12-11 LAB — BASIC METABOLIC PANEL
BUN: 37 mg/dL — ABNORMAL HIGH (ref 6–23)
CO2: 15 mEq/L — ABNORMAL LOW (ref 19–32)
Chloride: 107 mEq/L (ref 96–112)
GFR calc non Af Amer: 27 mL/min — ABNORMAL LOW (ref >90)
Glucose, Bld: 86 mg/dL (ref 70–99)
Potassium: 4.2 mEq/L (ref 3.5–5.1)

## 2013-12-11 LAB — URINE CULTURE
Colony Count: NO GROWTH
Culture: NO GROWTH

## 2013-12-11 LAB — PROTIME-INR
INR: 3.96 — ABNORMAL HIGH (ref 0.00–1.49)
Prothrombin Time: 37.2 seconds — ABNORMAL HIGH (ref 11.6–15.2)

## 2013-12-11 LAB — URIC ACID: Uric Acid, Serum: 6.8 mg/dL (ref 4.0–7.8)

## 2013-12-11 MED ORDER — BOOST / RESOURCE BREEZE PO LIQD: 1.0000 | Freq: Two times a day (BID) | ORAL | Status: AC

## 2013-12-11 MED ORDER — PROMETHAZINE HCL 25 MG PO TABS: 25.0000 mg | ORAL_TABLET | Freq: Four times a day (QID) | ORAL | Status: DC | PRN

## 2013-12-11 MED ORDER — DRONABINOL 2.5 MG PO CAPS: 2.5000 mg | ORAL_CAPSULE | Freq: Two times a day (BID) | ORAL | Status: DC

## 2013-12-11 MED ORDER — METOPROLOL SUCCINATE 12.5 MG HALF TABLET: 12.5000 mg | ORAL_TABLET | Freq: Two times a day (BID) | ORAL | Status: DC

## 2013-12-11 MED ORDER — METOPROLOL SUCCINATE 12.5 MG HALF TABLET: 12.5000 mg | ORAL_TABLET | Freq: Every day | ORAL | Status: DC

## 2013-12-11 MED ORDER — TESTOSTERONE CYPIONATE 200 MG/ML IM SOLN
400.0000 mg | Freq: Once | INTRAMUSCULAR | Status: AC
  Administered 2013-12-11: 400 mg via INTRAMUSCULAR
  Filled 2013-12-11: qty 2

## 2013-12-11 MED ORDER — HEPARIN SOD (PORK) LOCK FLUSH 100 UNIT/ML IV SOLN
500.0000 [IU] | INTRAVENOUS | Status: AC | PRN
  Administered 2013-12-11: 500 [IU]

## 2013-12-11 MED ORDER — TESTOSTERONE CYPIONATE 100 MG/ML IM SOLN
400.0000 mg | Freq: Once | INTRAMUSCULAR | Status: DC
  Filled 2013-12-11: qty 4

## 2013-12-11 NOTE — Discharge Summary (Signed)
Physician Discharge Summary  [name] [name] UJW:[id number] DOB: 1930-03-08 DOA: 12/09/2013  PCP: [name], MD  Admit date: 12/09/2013 Discharge date: 12/11/2013  Recommendations for Outpatient Follow-up:  1. Home with hospice care  Discharge Diagnoses:  Principal Problem:   Dehydration Active Problems:   CAD   CKD (chronic kidney disease)   Paroxysmal atrial fibrillation   Metastatic adenocarcinoma   Anemia of chronic disease   Chronic systolic CHF (congestive heart failure)   High output ileostomy-CHRONIC   Protein-calorie malnutrition, severe   Cardiomyopathy, ischemic   Shortness of breath   Hyperkalemia   Hypotension   UTI (lower urinary tract infection)   Hypoxia   Discharge Condition: stable, improved  Diet recommendation: regular diet, low salt, low potassium  Wt Readings from Last 3 Encounters:  12/11/13 61.689 kg (136 lb)  12/05/13 63.504 kg (140 lb)  12/01/13 62.143 kg (137 lb)    History of present illness:  [name] [name] is a 79 y.o. male with extensive PMH-stage IV adenocarcinoma of lung-chemotherapy discontinued because he could not tolerate, CAD, status post CABG, ulcerative colitis status post colectomy, high-output ileostomy, atrial fibrillation status post prior failed cardioversion- on Coumadin, chronic systolic CHF with LVEF 45-50%, hyperlipidemia, mitral regurgitation, chronic kidney disease presented to the ED with complaints of generalized weakness. Patient gives one-week history of lack of taste, progressively decreased appetite, generalized weakness, dizziness when he gets up, chills at night but no fevers. The symptoms seem to have worsened in the last 1-2 days. He has chronic dyspnea, mostly on exertion which he states is unchanged. He denies headache, earache, sore throat, chest pain, orthopnea, cough, nausea, vomiting, abdominal pain. He does complain of intermittent runny nose ongoing for the last 6 weeks. Output and colostomy bag also  seems watery but states that that is because he has not been eating much. In the ED, blood pressures are soft in the 90s, sodium 133, potassium 5.4, bicarbonate 16, BUN 48, creatinine 2.51, hemoglobin 8.9, platelets 72, pro BNP 31194, chest x-ray without acute findings and VQ scan read as very low probability for pulmonary embolism. In the ED, patient received IV fluid bolus 500 mL times one, orthostatics were negative. EDP discussed his case with Dr. Ladona Ridgel, cardiologist on call who recommended discharge and followup in the cardiology clinic in the a.m. Patient was given a dose of Kayexalate. He was ambulated and his oxygen saturation dropped to 82%. Hospitalist admission was requested.  Hospital Course:   Dehydration with hyponatremia/hypotension/generalized weakness.  Likely secondary to dehydration from poor oral intake and high output ileostomy.  He has had poor appetite during his hospitalization.  He was started on testosterone.  Discussed possibility of other appetite stimulants such as dexamethasone, megace, marinol.  He met with the nutritionist.  He is high risk to return to the hospital with dehydration which both he and his wife are aware.  They would like to pursue hospice care to help prevent future hospitalizations.  Recommended against restarting diuretics at this time secondary to ongoing poor oral intake.    Hypertension, blood pressures continue to be mildly low.  Stopped his metoprolol and Imdur.  Possible UTI.  He was started on ceftriaxone which was discontinued after his urine culture was negative.   Mild hyperkalemia, likely due to CKD and dehydration while continuing to take potassium.  Resolved with kayexalate, IV fluids and holding potassium supplements.    Hypoxia, secondary to atelectasis, failure to thrive, progressive lung cancer. Agree that the patient is not overtly in heart failure,  despite his elevated BNP. His CT scan demonstrated no evidence of pulmonary embolism,  however he did have some poor ventilation and some of the upper lobe areas.  Encouraged incentive spirometry and out of bed.  Paroxysmal A. fib/A. Flutter Although he was previously scheduled to undergo cardioversion, because he has remained asymptomatic from his a-fib, cardiology decided that the risks of cardioversion were outweighed by the benefits.  Although he has fatigue, it is felt that his fatigue is likely due to progressive cancer and dehydration and less likely to be due to his a-fib.  He continued anticoagulation with warfarin and his levels were supratherapeutic so pharmacy recommended to stop warfarin and recheck the INR on Monday.    CAD/CABG/ischemic cardiomyopathy/chronic systolic CHF  Many of his medications have been held due to persistent hypotension, AKI and hyperkalemia including spironolactone, Lasix, ACE inhibitors, hydralazine, and Coreg.  He should see cardiology and oncology shortly after discharge to discuss whether he would benefit from restarting any of these medications.    Chronic kidney disease stage IV, creatinine near baseline.    Anemia and thrombocytopenia platelets and hemoglobin approximately stable, likely secondary to underlying malignancy.  Stage IV adenocarcinoma lung Appreciate Dr. Gustavo Lah assistance, including code status conversation.  Non-anion gap metabolic acidosis likely secondary to high output ileostomy. stable   Failure to thrive, evaluated by physical therapy who recommended home health physical therapy with 24-hour assistance. They also recommended a rolling walker.  Will arrange for home hospice who can provide further equipment and services.    Severe protein calorie malnutrition, regular diet, continue supplements, appreciate nutrition recommendations   Consultants:  cardiology  oncology Procedures:  chest x-ray  VQ scan Antibiotics:  ceftriaxone from 12/17 > 12/18  Discharge Exam: Filed Vitals:   12/11/13 0605  BP: 102/83   Pulse: 120  Temp:   Resp:    Filed Vitals:   12/11/13 0555 12/11/13 0559 12/11/13 0605 12/11/13 1000  BP: 100/67 102/70 102/83   Pulse: 109 114 120   Temp: 97.7 F (36.5 C)     TempSrc: Oral     Resp: 16     Height:      Weight:      SpO2: 96%   96%    General: cachectic Caucasian male, No acute distress  HEENT: NCAT, MMM  Cardiovascular: IRRR, nl S1, S2 no mrg, 2+ pulses, warm extremities  Respiratory: Diminished bilateral breath sounds, but otherwise clear to auscultation, no wheezes, rales, rhonchi, no increased WOB  Abdomen: NABS, soft, NT/ND, ostomy with green fluid MSK: decreased tone and bulk, no LEE  Neuro: Grossly intact   Discharge Instructions      Discharge Orders   Future Appointments Provider Department Dept Phone   12/12/2013 7:30 AM [facility] [place] [facility] [phone]   12/16/2013 9:30 AM Rachael Fee [facility] [phone]   12/16/2013 9:45 AM [facility] In[facility] Nurse [facility] [phone]   12/23/2013 2:15 PM [name], MD [facility] [place] [facility] [phone]   12/24/2013 2:00 PM [facility] [phone]   Pt should arrive15 minutes prior to scheduled appt time. Please inform patient that exam will take a minimum of 1 1/2 hours. Patient to be NPO 6 hours prior to exam  and should not take any insulin the day of exam.   01/07/2014 10:30 AM Sharlotte Alamo [facility] [phone]  01/07/2014 11:00 AM Josph Macho, MD [facility] [facility] [phone]   01/07/2014 11:45 AM [facility] Inj Nurse [place] [facility] [phone]   Future Orders Complete By Expires   Call MD for:  difficulty breathing, headache or visual disturbances  As directed    Call MD for:  extreme fatigue  As directed    Call MD for:  hives  As directed    Call MD for:   persistant dizziness or light-headedness  As directed    Call MD for:  persistant nausea and vomiting  As directed    Call MD for:  severe uncontrolled pain  As directed    Call MD for:  temperature >100.4  As directed    Diet general  As directed    Comments:     Low potassium   Discharge instructions  As directed    Comments:     You were hospitalized with dehydration.  You need to drink at least 1.5L of fluid per day.  I have given you prescriptions for marinol and phenergan.  Marinol is an appetite stimulant which can be taken twice a day.  Phenergan is a nausea medication which you can take as needed for nausea and vomiting.  Because your blood pressure has been low during your admission, your imdur was stopped and I have reduced the dose of your metoprolol to 12.5mg  twice daily.  Your metoprolol dose may need to be decreased even further if your blood pressure becomes lower.  Please talk to the hospice care workers about what you would like done if you are unable to eat and drink enough to stay hydrated.   Increase activity slowly  As directed        Medication List    STOP taking these medications       isosorbide mononitrate 60 MG 24 hr tablet  Commonly known as:  IMDUR     potassium chloride SA 20 MEQ tablet  Commonly known as:  K-DUR,KLOR-CON     warfarin 2.5 MG tablet  Commonly known as:  COUMADIN      TAKE these medications       acetaminophen 500 MG tablet  Commonly known as:  TYLENOL  Take 500 mg by mouth every 6 (six) hours as needed for moderate pain.     allopurinol 100 MG tablet  Commonly known as:  ZYLOPRIM  Take 100 mg by mouth at bedtime.     amiodarone 200 MG tablet  Commonly known as:  PACERONE  Take 1 tablet (200 mg total) by mouth 2 (two) times daily.     atorvastatin 40 MG tablet  Commonly known as:  LIPITOR  Take 20 mg by mouth every evening. 1/2 tablet daily     cholecalciferol 1000 UNITS tablet  Commonly known as:  VITAMIN D  Take 1,000  Units by mouth every morning.     dronabinol 2.5 MG capsule  Commonly known as:  MARINOL  Take 1 capsule (2.5 mg total) by mouth 2 (two) times daily before lunch and supper.     feeding supplement (RESOURCE BREEZE) Liqd  Take 1 Container by mouth 2 (two) times daily with a meal.     ferrous sulfate 325 (65 FE) MG tablet  Take 325 mg by mouth every evening.     fish oil-omega-3 fatty acids 1000 MG capsule  Take 1 g by mouth 2 (two) times daily.     FLAXSEED (LINSEED) PO  Take 1 tablet by mouth at bedtime.     folic acid 400 MCG tablet  Commonly known as:  FOLVITE  Take 400 mcg by mouth every morning.     metoprolol succinate 12.5 mg Tb24 24 hr tablet  Commonly known as:  TOPROL-XL  Take 0.5 tablets (12.5 mg total) by mouth daily.     nitroGLYCERIN 0.4 MG SL tablet  Commonly known as:  NITROSTAT  Place 1 tablet (0.4 mg total) under the tongue every 5 (five) minutes as needed for chest pain.     promethazine 25 MG tablet  Commonly known as:  PHENERGAN  Take 1 tablet (25 mg total) by mouth every 6 (six) hours as needed for nausea or vomiting.     tamsulosin 0.4 MG Caps capsule  Commonly known as:  FLOMAX  Take 0.4 mg by mouth daily after supper.       Follow-up Information   Schedule an appointment as soon as possible for a visit with [name], MD. (For follow-up tomorrow per Dr. Ladona Ridgel)    Specialty:  Cardiology   Contact information:   [address] [place] [address] [place] [address] [phone]        The results of significant diagnostics from this hospitalization (including imaging, microbiology, ancillary and laboratory) are listed below for reference.    Significant Diagnostic Studies: Dg Chest 2 View  12/09/2013   CLINICAL DATA:  History lung cancer  EXAM: CHEST  2 VIEW  COMPARISON:  PET-CT 10/29/2013  FINDINGS: There is a left-sided Port-A-Cath with the tip projecting over the SVC. There is right paratracheal soft tissue prominence in the right  supraclavicular and infraclavicular region with leftward deviation of the trachea. There is no focal consolidation, pleural effusion or pneumothorax. The heart and mediastinal contours are unremarkable.  The osseous structures are unremarkable.  IMPRESSION: No active cardiopulmonary disease.  Right upper paratracheal soft tissue prominence better visualized on prior PET-CT of 10/29/2013.   Electronically Signed   By: Elige Ko   On: 12/09/2013 17:50   Nm Pulmonary Perf And Vent  12/09/2013   CLINICAL DATA:  Chest pain, shortness of breath, history lung cancer, renal dysfunction  EXAM: NUCLEAR MEDICINE VENTILATION - PERFUSION LUNG SCAN  TECHNIQUE: Ventilation images were obtained in multiple projections using inhaled aerosol technetium 99 M DTPA. Perfusion images were obtained in multiple projections after intravenous injection of Tc-1m MAA.  COMPARISON:  None  Correlation:  Chest radiograph 12/09/2013  RADIOPHARMACEUTICALS:  40.3 mCi Tc-37m DTPA aerosol and 4.9 mCi Tc-26m MAA  FINDINGS: Ventilation: Central airway deposition of aerosol. Patchy diminished ventilation in right upper lobe. Small amount of swallowed aerosol within stomach.  Perfusion: Much better perfusion than ventilation particularly in right upper lobe. No segmental or subsegmental perfusion defects. Minimally irregularity of perfusion at the lower lobes.  Chest radiograph significant for upper normal heart size post median sternotomy/ CABG, Port-A-Cath, and mild elevation of right diaphragm.  IMPRESSION: Very low probability for pulmonary embolism.   Electronically Signed   By: Ulyses Southward M.D.   On: 12/09/2013 22:18    Microbiology: Recent Results (from the past 240 hour(s))  URINE CULTURE     Status: None   Collection Time    12/09/13 11:14 PM      Result Value Range Status   Specimen Description URINE, CLEAN CATCH   Final   Special Requests NONE   Final   Culture  Setup Time     Final   Value: 12/10/2013 05:02  Performed  at [facility] Count     Final   Value: NO GROWTH     Performed at [facility]   Culture     Final   Value: NO GROWTH     Performed at [facility]   Report Status 12/11/2013 FINAL   Final     Labs: Basic Metabolic Panel:  Recent Labs Lab 12/09/13 1840 12/10/13 0615 12/11/13 0605  NA 133* 136 135  K 5.4* 4.7 4.2  CL 105 108 107  CO2 16* 17* 15*  GLUCOSE 75 82 86  BUN 48* 46* 37*  CREATININE 2.51* 2.41* 2.18*  CALCIUM 9.0 8.9 8.8   Liver Function Tests:  Recent Labs Lab 12/10/13 0615  AST 19  ALT 9  ALKPHOS 67  BILITOT 0.4  PROT 5.3*  ALBUMIN 1.9*   No results found for this basename: LIPASE, AMYLASE,  in the last 168 hours No results found for this basename: AMMONIA,  in the last 168 hours CBC:  Recent Labs Lab 12/09/13 1840 12/10/13 0615 12/11/13 0605  WBC 6.9 6.2 7.6  NEUTROABS 5.9  --   --   HGB 8.9* 9.1* 9.5*  HCT 27.5* 27.9* 29.2*  MCV 99.6 101.1* 100.7*  PLT 72* 67* 86*   Cardiac Enzymes:  Recent Labs Lab 12/10/13 1308  TROPONINI <0.30   BNP: BNP (last 3 results)  Recent Labs  03/05/13 1208 12/09/13 1840  PROBNP 557.0* 31194.0*   CBG: No results found for this basename: GLUCAP,  in the last 168 hours  Time coordinating discharge: 45 minutes  Signed:  Marasia Newhall  [facility] 12/11/2013, 12:29 PM

## 2013-12-11 NOTE — Progress Notes (Signed)
Physical Therapy Treatment Patient Details Name: [name] [name] MRN: [id number] DOB: [birth year] Today's Date: 12/11/2013 Time: 8469-6295 PT Time Calculation (min): 9 min  PT Assessment / Plan / Recommendation  History of Present Illness 79 yo male admitted with dehydration, SOB, weakness. Hx of stage IV lung cancer, A fib/flutter, PVD, MI   PT Comments   Progressing well with mobility. Pt able to tolerated increased distance. O2 sats briefly dropped to 85% on RA but then quickly returned to 91% (pt maintained 91% on RA through most of ambulation distance of 400 feet). Plan is for possible d/c later today. Pt would benefit from walker use for safety/stability however therapist no longer feels pt needs HHPT.   Follow Up Recommendations  No PT follow up;Supervision - Intermittent     Does the patient have the potential to tolerate intense rehabilitation     Barriers to Discharge        Equipment Recommendations  Other (comment) (4 wheeled walker/rollator)    Recommendations for Other Services OT consult  Frequency Min 3X/week   Progress towards PT Goals Progress towards PT goals: Progressing toward goals  Plan Discharge plan needs to be updated    Precautions / Restrictions Precautions Precautions: None Restrictions Weight Bearing Restrictions: No   Pertinent Vitals/Pain No c/o pain    Mobility  Transfers Transfers: Sit to Stand;Stand to Sit Sit to Stand: 6: Modified independent (Device/Increase time);From chair/3-in-1 Stand to Sit: 6: Modified independent (Device/Increase time);To chair/3-in-1 Ambulation/Gait Ambulation/Gait Assistance: 5: Supervision Ambulation Distance (Feet): 400 Feet Assistive device: Rolling walker Ambulation/Gait Assistance Details: Good gait speed. O2 sats briefly dropped to 85% but then quickly returned to 91% on RA.  Gait Pattern: Step-through pattern    Exercises     PT Diagnosis:    PT Problem List:   PT Treatment Interventions:     PT  Goals (current goals can now be found in the care plan section) Acute Rehab PT Goals Patient Stated Goal: none stated  Visit Information  Last PT Received On: 12/11/13 Assistance Needed: +1 History of Present Illness: 79 yo male admitted with dehydration, SOB, weakness. Hx of stage IV lung cancer, A fib/flutter, PVD, MI    Subjective Data  Patient Stated Goal: none stated   Cognition  Cognition Arousal/Alertness: Awake/alert Behavior During Therapy: WFL for tasks assessed/performed Overall Cognitive Status: Within Functional Limits for tasks assessed    Balance  Balance Balance Assessed: Yes Static Standing Balance Static Standing - Level of Assistance: 5: Stand by assistance  End of Session PT - End of Session Activity Tolerance: Patient tolerated treatment well Patient left: in chair;with call bell/phone within reach   GP     Rebeca Alert, MPT Pager: [phone]

## 2013-12-11 NOTE — Progress Notes (Signed)
Pharmacist Heart Failure Core Measure Documentation  Assessment: [name] [name] has an EF documented as 35-40% on 12/10/13 by ECHO.  Rationale: Heart failure patients with left ventricular systolic dysfunction (LVSD) and an EF < 40% should be prescribed an angiotensin converting enzyme inhibitor (ACEI) or angiotensin receptor blocker (ARB) at discharge unless a contraindication is documented in the medical record.  This patient is not currently on an ACEI or ARB for HF.  This note is being placed in the record in order to provide documentation that a contraindication to the use of these agents is present for this encounter.  ACE Inhibitor or Angiotensin Receptor Blocker is contraindicated (specify all that apply)  []   ACEI allergy AND ARB allergy []   Angioedema []   Moderate or severe aortic stenosis []   Hyperkalemia [x]   Hypotension []   Renal artery stenosis [x]   Worsening renal function, preexisting renal disease or dysfunction   [name] [name] 12/11/2013 12:19 PM

## 2013-12-11 NOTE — Evaluation (Signed)
Occupational Therapy Evaluation Patient Details Name: [name] [name] MRN: [id number] DOB: [birth year] Today's Date: 12/11/2013 Time: 7829-5621 OT Time Calculation (min): 28 min  OT Assessment / Plan / Recommendation History of present illness 79 yo male admitted with dehydration, SOB, weakness. Hx of stage IV lung cancer, A fib/flutter, PVD, MI   Clinical Impression   Pt educated on energy conservation strategies and issued handout. Reviewed info with wife also. Pt declined 3in1 currently but educated on benefit of it as a shower chair with handles. No further OT needs. Pt and wife feel ok with ADL.    OT Assessment  Patient does not need any further OT services    Follow Up Recommendations  No OT follow up;Supervision/Assistance - 24 hour    Barriers to Discharge      Equipment Recommendations  None recommended by OT (pt declines 3in1 for shower)    Recommendations for Other Services    Frequency       Precautions / Restrictions Precautions Precautions: Fall Restrictions Weight Bearing Restrictions: No   Pertinent Vitals/Pain 96% after activity; room air    ADL  Eating/Feeding: Simulated;Independent Where Assessed - Eating/Feeding: Bed level Grooming: Simulated;Wash/dry hands Where Assessed - Grooming: Supported sitting Upper Body Bathing: Simulated;Chest;Right arm;Left arm;Abdomen;Set up Where Assessed - Upper Body Bathing: Unsupported sitting Lower Body Bathing: Simulated;Min guard Where Assessed - Lower Body Bathing: Supported sit to stand Upper Body Dressing: Simulated;Set up Where Assessed - Upper Body Dressing: Unsupported sitting Lower Body Dressing: Simulated;Min guard Where Assessed - Lower Body Dressing: Supported sit to stand Toilet Transfer: Simulated;Min Pension scheme manager Method:  (standing urinal. has colostomy) Toileting - Clothing Manipulation and Hygiene: Simulated;Min guard Where Assessed - Glass blower/designer Manipulation and Hygiene:  Standing Tub/Shower Transfer: Performed;Min guard Equipment Used: Rolling walker ADL Comments: Issued energy conservation handout as pt states he notices faitgue with activity. Reviewed information with pt and spouse. Pt states he has recently had some difficulty with standing up from shower chair in shower and demonstrated 3in1 option to use armrests to help stand but pt declines DME. Also showed pt how to use walker to back up to shower and step over ledge and also use walker to step back out. Demonstrated how wife is to hold walker steady and pt practiced and did well. He states he plans to put grab bars in shower to help with standing up from chair. Explained that if also has difficulty with the stepping out of shower he can use walker as shown to help with stepping in/out. Wife and pt verbalize understanding. No further OT needs.     OT Diagnosis:    OT Problem List:   OT Treatment Interventions:     OT Goals(Current goals can be found in the care plan section) Acute Rehab OT Goals Patient Stated Goal: none stated  Visit Information  Last OT Received On: 12/11/13 Assistance Needed: +1 History of Present Illness: 79 yo male admitted with dehydration, SOB, weakness. Hx of stage IV lung cancer, A fib/flutter, PVD, MI       Prior Functioning     Home Living Family/patient expects to be discharged to:: Private residence Living Arrangements: Spouse/significant other Available Help at Discharge: Family Type of Home:  (townhome) Home Access: Stairs to enter [employer] of Steps: 1 small threshold Entrance Stairs-Rails: None Home Layout: Two level;Able to live on main level with bedroom/bathroom Home Equipment: None Additional Comments: using wife's walker for now Prior Function Level of Independence: Independent with assistive device(s);Needs assistance ADL's /  Homemaking Assistance Needed: wife helped recently with in and out of shower. pt states X 1 difficulty to get up  from shower chair. Communication Communication: No difficulties         Vision/Perception Vision - History Baseline Vision: Wears glasses all the time   Cognition  Cognition Arousal/Alertness: Awake/alert Behavior During Therapy: WFL for tasks assessed/performed Overall Cognitive Status: Within Functional Limits for tasks assessed    Extremity/Trunk Assessment Upper Extremity Assessment Upper Extremity Assessment: Generalized weakness     Mobility Bed Mobility Supine to Sit: 4: Min guard;HOB elevated Details for Bed Mobility Assistance: increased time Transfers Transfers: Sit to Stand;Stand to Sit Sit to Stand: 5: Supervision;With upper extremity assist;From bed Stand to Sit: 5: Supervision;With upper extremity assist;To chair/3-in-1     Exercise     Balance Balance Balance Assessed: Yes Static Standing Balance Static Standing - Level of Assistance: 5: Stand by assistance   End of Session OT - End of Session Equipment Utilized During Treatment: Rolling walker Activity Tolerance: Patient tolerated treatment well Patient left: in chair;with call bell/phone within reach  GO     Lennox Laity [phone] 12/11/2013, 10:36 AM

## 2013-12-11 NOTE — Progress Notes (Signed)
ANTICOAGULATION CONSULT NOTE - Follow Up Consult  Pharmacy Consult for warfarin Indication: atrial fibrillation  No Known Allergies  Patient Measurements: Height: 5\' 4"  (162.6 cm) Weight: 161 lb (61.689 kg) IBW/kg (Calculated) : 59.2 Heparin Dosing Weight:   Vital Signs: Temp: 97.7 F (36.5 C) (12/18 0555) Temp src: Oral (12/18 0555) BP: 102/83 mmHg (12/18 0605) Pulse Rate: 120 (12/18 0605)  Labs:  Recent Labs  12/09/13 1840 12/10/13 0615 12/10/13 1308 12/11/13 0605  HGB 8.9* 9.1*  --  9.5*  HCT 27.5* 27.9*  --  29.2*  PLT 72* 67*  --  86*  LABPROT 34.6* 36.9*  --  37.2*  INR 3.61* 3.92*  --  3.96*  CREATININE 2.51* 2.41*  --  2.18*  TROPONINI  --   --  <0.30  --     Estimated Creatinine Clearance: 22.6 ml/min (by C-G formula based on Cr of 2.18).   Assessment: [name] [name] admitted with weakness, pharmacy consulted to resume coumadin dosing for history of afib.   Home warfarin dose (per coumadin clinic 12/12): dose increased to 2.5mg  daily with 5mg  MWF, per medication history - patient stated was taking 5mg  daily with 2.5mg  TuThSa  Today's INR - 3.96  CBC: Hgb = 9.5 platelets = 86 (chronic thrombocytopenia)  Goal of Therapy:  INR 2-3 Monitor platelets by anticoagulation protocol: Yes   Plan:   Continue to hold warfarin for SUPRAtherapeutic INR  Check daily INR  Monitor for bleeding, watch platelets  [name], PharmD, BCPS.   Pager: [phone]  12/11/2013,8:55 AM

## 2013-12-11 NOTE — Progress Notes (Signed)
[facility]  Patient Status: New  [facility] is providing the following services: RN.  [facility] called to schedule a HH RN start of care visit for 12/191/4 and the patient has refused any visits until after Christmas.    If patient discharges after hours, please call [phone].   Lanae Crumbly 12/11/2013, 4:36 PM

## 2013-12-11 NOTE — Progress Notes (Signed)
     SUBJECTIVE: No complaints this am.   BP 102/83  Pulse 100  Temp(Src) 97.7 F (36.5 C) (Oral)  Resp 16  Ht 5\' 4"  (1.626 m)  Wt 136 lb (61.689 kg)  BMI 23.33 kg/m2  SpO2 96%  Intake/Output Summary (Last 24 hours) at 12/11/13 0644 Last data filed at 12/11/13 0500  Gross per 24 hour  Intake    909 ml  Output    725 ml  Net    184 ml    PHYSICAL EXAM General: Well developed, well nourished, in no acute distress. Alert and oriented x 3.  Psych:  Good affect, responds appropriately Neck: No JVD. No masses noted.  Lungs: Clear bilaterally with no wheezes or rhonci noted.  Heart: Irreg irreg with no murmurs noted. Abdomen: Bowel sounds are present. Soft, non-tender.  Extremities: No lower extremity edema.   LABS: Basic Metabolic Panel:  Recent Labs  16/10/96 1840 12/10/13 0615  NA 133* 136  K 5.4* 4.7  CL 105 108  CO2 16* 17*  GLUCOSE 75 82  BUN 48* 46*  CREATININE 2.51* 2.41*  CALCIUM 9.0 8.9   CBC:  Recent Labs  12/09/13 1840 12/10/13 0615 12/11/13 0605  WBC 6.9 6.2 7.6  NEUTROABS 5.9  --   --   HGB 8.9* 9.1* 9.5*  HCT 27.5* 27.9* 29.2*  MCV 99.6 101.1* 100.7*  PLT 72* 67* 86*   Cardiac Enzymes:  Recent Labs  12/10/13 1308  TROPONINI <0.30    Current Meds: . allopurinol  100 mg Oral QHS  . amiodarone  200 mg Oral BID  . atorvastatin  20 mg Oral QPM  . cefTRIAXone (ROCEPHIN)  IV  1 g Intravenous QHS  . cholecalciferol  1,000 Units Oral Daily  . feeding supplement (ENSURE COMPLETE)  237 mL Oral BID BM  . feeding supplement (RESOURCE BREEZE)  1 Container Oral BID WC  . folic acid  1,000 mcg Oral Daily  . omega-3 acid ethyl esters  1 g Oral BID  . sodium chloride  3 mL Intravenous Q12H  . tamsulosin  0.4 mg Oral QPC supper  . Warfarin - Pharmacist Dosing Inpatient   Does not apply q1800     ASSESSMENT AND PLAN:  1. Atrial fibrillation: Currently asymptomatic with controlled rates. There were plans for outpatient cardioversion guided by  TEE this week. Since he is asymptomatic, will not plan on TEE guided cardioversion tomorrow. I will discuss this with his cardiologist Dr. [name] later today and will continue to follow with you. Continue amiodarone and coumadin.    2. Stage IV Lung Cancer: No plans for further chemotherapy. Poor prognosis. He is a DNR. Failure to thrive at this time due to complex issues including end stage cancer.   3. Chronic systolic CHF: No clinical signs of volume overload. Diuretics on hold.   MCALHANY,CHRISTOPHER  12/18/20146:44 AM

## 2013-12-11 NOTE — Progress Notes (Signed)
Spoke with pt concerning discharge plans. Wife at bedside. Pt do not want Hospice at present time. Pt had no preferences for agencies. Referral given to in house rep with [facility].

## 2013-12-11 NOTE — Progress Notes (Signed)
Mr. Zahradnik is doing better. He sounds much stronger. His appetite might be a little bit improved.  We did give him a dose of Aranesp. He also got a dose of IV iron as his iron studies suggested some iron deficiency.  I spoke with cardiology. They will decide about doing a cardioversion on him. His INR is 3.96.  His white cell count 7.6. His hemoglobin 9.5. Platelet count 86. He has an incredibly low testosterone level. As such, I think if we gave him a testosterone shot this may help his performance status and also help his anemia.  On his physical exam, this is an elderly white gentleman. He is chronically ill appearing. Temperature 97.7. Pulse is 120. Blood pressure 102/83. His lungs sound clear. Cardiac exam is tachycardic and irregular. Abdomen is soft. There is no palpable abdominal mass. There is no palpable pedal splenomegaly. Neck exam shows stable right cervical/supraclavicular lymph nodes. Extremities shows some trace edema.  Again, my goal for him is quality-of-life. Cardiology does not think cardioverting him will help his quality of life, then this does not need to be done.  I think it we can work on his anemia this will help him. I also think if we can get his testosterone level better, this may help.  We will find out about his cancer status and would do his PET scan in a couple weeks.  Pete E  Psalm 30:2

## 2013-12-12 ENCOUNTER — Other Ambulatory Visit: Payer: Medicare Other

## 2013-12-12 ENCOUNTER — Encounter (HOSPITAL_COMMUNITY): Admission: RE | Payer: Self-pay | Source: Ambulatory Visit

## 2013-12-12 ENCOUNTER — Ambulatory Visit (HOSPITAL_COMMUNITY): Admission: RE | Admit: 2013-12-12 | Payer: Medicare Other | Source: Ambulatory Visit | Admitting: Cardiology

## 2013-12-12 ENCOUNTER — Other Ambulatory Visit: Payer: Self-pay | Admitting: *Deleted

## 2013-12-12 SURGERY — ECHOCARDIOGRAM, TRANSESOPHAGEAL
Anesthesia: Moderate Sedation

## 2013-12-12 MED ORDER — AMBULATORY NON FORMULARY MEDICATION: Status: DC

## 2013-12-16 ENCOUNTER — Ambulatory Visit (HOSPITAL_BASED_OUTPATIENT_CLINIC_OR_DEPARTMENT_OTHER): Payer: Medicare Other

## 2013-12-16 ENCOUNTER — Other Ambulatory Visit (HOSPITAL_BASED_OUTPATIENT_CLINIC_OR_DEPARTMENT_OTHER): Payer: Medicare Other | Admitting: Lab

## 2013-12-16 ENCOUNTER — Telehealth: Payer: Self-pay | Admitting: Hematology & Oncology

## 2013-12-16 VITALS — BP 80/63 | HR 106 | Temp 96.3°F | Resp 16

## 2013-12-16 DIAGNOSIS — D631 Anemia in chronic kidney disease: Secondary | ICD-10-CM

## 2013-12-16 DIAGNOSIS — N189 Chronic kidney disease, unspecified: Secondary | ICD-10-CM

## 2013-12-16 DIAGNOSIS — N179 Acute kidney failure, unspecified: Secondary | ICD-10-CM

## 2013-12-16 DIAGNOSIS — D509 Iron deficiency anemia, unspecified: Secondary | ICD-10-CM

## 2013-12-16 DIAGNOSIS — C799 Secondary malignant neoplasm of unspecified site: Secondary | ICD-10-CM

## 2013-12-16 DIAGNOSIS — D638 Anemia in other chronic diseases classified elsewhere: Secondary | ICD-10-CM

## 2013-12-16 LAB — BASIC METABOLIC PANEL - CANCER CENTER ONLY
BUN, Bld: 38 mg/dL — ABNORMAL HIGH (ref 7–22)
Chloride: 106 mEq/L (ref 98–108)
Potassium: 4.5 mEq/L (ref 3.3–4.7)
Sodium: 134 mEq/L (ref 128–145)

## 2013-12-16 LAB — CBC WITH DIFFERENTIAL (CANCER CENTER ONLY)
BASO#: 0 10*3/uL (ref 0.0–0.2)
BASO%: 0.1 % (ref 0.0–2.0)
EOS%: 0.6 % (ref 0.0–7.0)
Eosinophils Absolute: 0 10*3/uL (ref 0.0–0.5)
HCT: 29 % — ABNORMAL LOW (ref 38.7–49.9)
HGB: 9.3 g/dL — ABNORMAL LOW (ref 13.0–17.1)
LYMPH#: 0.5 10*3/uL — ABNORMAL LOW (ref 0.9–3.3)
LYMPH%: 6.5 % — ABNORMAL LOW (ref 14.0–48.0)
MCHC: 32.1 g/dL (ref 32.0–35.9)
NEUT#: 6 10*3/uL (ref 1.5–6.5)
Platelets: 104 10*3/uL — ABNORMAL LOW (ref 145–400)
RBC: 2.79 10*6/uL — ABNORMAL LOW (ref 4.20–5.70)
WBC: 7.2 10*3/uL (ref 4.0–10.0)

## 2013-12-16 MED ORDER — TESTOSTERONE CYPIONATE 200 MG/ML IM SOLN
INTRAMUSCULAR | Status: AC
  Filled 2013-12-16: qty 1

## 2013-12-16 MED ORDER — TESTOSTERONE CYPIONATE 200 MG/ML IM SOLN
200.0000 mg | Freq: Once | INTRAMUSCULAR | Status: AC
  Administered 2013-12-16: 200 mg via INTRAMUSCULAR

## 2013-12-16 MED ORDER — DARBEPOETIN ALFA-POLYSORBATE 300 MCG/0.6ML IJ SOLN
INTRAMUSCULAR | Status: AC
  Filled 2013-12-16: qty 0.6

## 2013-12-16 MED ORDER — DARBEPOETIN ALFA-POLYSORBATE 300 MCG/0.6ML IJ SOLN
300.0000 ug | Freq: Once | INTRAMUSCULAR | Status: AC
  Administered 2013-12-16: 300 ug via SUBCUTANEOUS

## 2013-12-16 NOTE — Patient Instructions (Signed)
Darbepoetin Alfa injection What is this medicine? DARBEPOETIN ALFA (dar be POE e tin AL fa) helps your body make more red blood cells. It is used to treat anemia caused by chronic kidney failure and chemotherapy. This medicine may be used for other purposes; ask your health care provider or pharmacist if you have questions. COMMON BRAND NAME(S): Aranesp What should I tell my health care provider before I take this medicine? They need to know if you have any of these conditions: -blood clotting disorders or history of blood clots -cancer patient not on chemotherapy -cystic fibrosis -heart disease, such as angina, heart failure, or a history of a heart attack -hemoglobin level of 12 g/dL or greater -high blood pressure -low levels of folate, iron, or vitamin B12 -seizures -an unusual or allergic reaction to darbepoetin, erythropoietin, albumin, hamster proteins, latex, other medicines, foods, dyes, or preservatives -pregnant or trying to get pregnant -breast-feeding How should I use this medicine? This medicine is for injection into a vein or under the skin. It is usually given by a health care professional in a hospital or clinic setting. If you get this medicine at home, you will be taught how to prepare and give this medicine. Do not shake the solution before you withdraw a dose. Use exactly as directed. Take your medicine at regular intervals. Do not take your medicine more often than directed. It is important that you put your used needles and syringes in a special sharps container. Do not put them in a trash can. If you do not have a sharps container, call your pharmacist or healthcare provider to get one. Talk to your pediatrician regarding the use of this medicine in children. While this medicine may be used in children as young as 1 year for selected conditions, precautions do apply. Overdosage: If you think you have taken too much of this medicine contact a poison control center or  emergency room at once. NOTE: This medicine is only for you. Do not share this medicine with others. What if I miss a dose? If you miss a dose, take it as soon as you can. If it is almost time for your next dose, take only that dose. Do not take double or extra doses. What may interact with this medicine? Do not take this medicine with any of the following medications: -epoetin alfa This list may not describe all possible interactions. Give your health care provider a list of all the medicines, herbs, non-prescription drugs, or dietary supplements you use. Also tell them if you smoke, drink alcohol, or use illegal drugs. Some items may interact with your medicine. What should I watch for while using this medicine? Visit your prescriber or health care professional for regular checks on your progress and for the needed blood tests and blood pressure measurements. It is especially important for the doctor to make sure your hemoglobin level is in the desired range, to limit the risk of potential side effects and to give you the best benefit. Keep all appointments for any recommended tests. Check your blood pressure as directed. Ask your doctor what your blood pressure should be and when you should contact him or her. As your body makes more red blood cells, you may need to take iron, folic acid, or vitamin B supplements. Ask your doctor or health care provider which products are right for you. If you have kidney disease continue dietary restrictions, even though this medication can make you feel better. Talk with your doctor or health   care professional about the foods you eat and the vitamins that you take. What side effects may I notice from receiving this medicine? Side effects that you should report to your doctor or health care professional as soon as possible: -allergic reactions like skin rash, itching or hives, swelling of the face, lips, or tongue -breathing problems -changes in vision -chest  pain -confusion, trouble speaking or understanding -feeling faint or lightheaded, falls -high blood pressure -muscle aches or pains -pain, swelling, warmth in the leg -rapid weight gain -severe headaches -sudden numbness or weakness of the face, arm or leg -trouble walking, dizziness, loss of balance or coordination -seizures (convulsions) -swelling of the ankles, feet, hands -unusually weak or tired Side effects that usually do not require medical attention (report to your doctor or health care professional if they continue or are bothersome): -diarrhea -fever, chills (flu-like symptoms) -headaches -nausea, vomiting -redness, stinging, or swelling at site where injected This list may not describe all possible side effects. Call your doctor for medical advice about side effects. You may report side effects to FDA at 1-800-FDA-1088. Where should I keep my medicine? Keep out of the reach of children. Store in a refrigerator between 2 and 8 degrees C (36 and 46 degrees F). Do not freeze. Do not shake. Throw away any unused portion if using a single-dose vial. Throw away any unused medicine after the expiration date. NOTE: This sheet is a summary. It may not cover all possible information. If you have questions about this medicine, talk to your doctor, pharmacist, or health care provider.  2014, Elsevier/Gold Standard. (2008-11-24 10:23:57)  

## 2013-12-16 NOTE — Telephone Encounter (Signed)
Pt in ofc today to pick up medical records for a cancer policy.   AUTHORIZATION FOR RELEASE/REQUEST OF MEDICAL INFORMATION signed.   COPY SCANNED

## 2013-12-17 ENCOUNTER — Ambulatory Visit: Payer: Medicare Other

## 2013-12-17 ENCOUNTER — Other Ambulatory Visit: Payer: Medicare Other | Admitting: Lab

## 2013-12-23 ENCOUNTER — Encounter: Payer: Self-pay | Admitting: Cardiology

## 2013-12-23 ENCOUNTER — Ambulatory Visit (INDEPENDENT_AMBULATORY_CARE_PROVIDER_SITE_OTHER): Payer: Medicare Other | Admitting: Cardiology

## 2013-12-23 ENCOUNTER — Other Ambulatory Visit: Payer: Self-pay

## 2013-12-23 VITALS — BP 110/64 | HR 116 | Ht 64.0 in | Wt 138.0 lb

## 2013-12-23 DIAGNOSIS — I5022 Chronic systolic (congestive) heart failure: Secondary | ICD-10-CM

## 2013-12-23 DIAGNOSIS — I4819 Other persistent atrial fibrillation: Secondary | ICD-10-CM | POA: Insufficient documentation

## 2013-12-23 DIAGNOSIS — I509 Heart failure, unspecified: Secondary | ICD-10-CM

## 2013-12-23 DIAGNOSIS — N189 Chronic kidney disease, unspecified: Secondary | ICD-10-CM

## 2013-12-23 DIAGNOSIS — I4891 Unspecified atrial fibrillation: Secondary | ICD-10-CM

## 2013-12-23 DIAGNOSIS — I2510 Atherosclerotic heart disease of native coronary artery without angina pectoris: Secondary | ICD-10-CM

## 2013-12-23 LAB — BASIC METABOLIC PANEL
CO2: 16 mEq/L — ABNORMAL LOW (ref 19–32)
Chloride: 111 mEq/L (ref 96–112)
GFR: 27.89 mL/min — ABNORMAL LOW (ref >60.00)
Glucose, Bld: 74 mg/dL (ref 70–99)
Potassium: 4.7 mEq/L (ref 3.5–5.1)
Sodium: 134 mEq/L — ABNORMAL LOW (ref 135–145)

## 2013-12-23 MED ORDER — METOPROLOL SUCCINATE ER 25 MG PO TB24: 25.0000 mg | ORAL_TABLET | Freq: Two times a day (BID) | ORAL | Status: DC

## 2013-12-23 MED ORDER — FUROSEMIDE 40 MG PO TABS: ORAL_TABLET | ORAL | Status: DC

## 2013-12-23 NOTE — Progress Notes (Signed)
Patient ID: [name] [name], male   DOB: 21-Jun-1930, 79 y.o.   MRN: [id number] PCP: Dr. Linford [name]  79 yo with history of CAD s/p CABG, ulcerative colitis s/p colectomy, and CHF presents for cardiology followup.  He had CABG in 10/11 complicated by post-operative atrial fibrillation.  He was started on amiodarone while in the hospital after CABG and this was continued due to risk of further atrial fibrillation in the setting of significantly enlarged left atrium.  He was admitted to the hospital in [place] in 7/12 with a diastolic CHF exacerbation and was diuresed.  I had him stop amiodarone in 2012 given no atrial fibrillation recurrence.    In 7/13, he began to note increased exertional dyspnea.  He went to the hospital in [place] in 7/13 with a diastolic CHF exacerbation.  I am not sure if he was in atrial fibrillation at that time as I have no records of that hospitalization.  It sounds like he was only in the hospital for 2-3 days.  He had an echo done there with EF 35-40%, severe MR, PA systolic pressure 50-60 mmHg.   Mr [name] [name] has had paroxysmal atrial fibrillation.  I started him on coumadin (NOACs not covered by his insurance) and Toprol XL.  In 9/13, I sent him for TEE-guided cardioversion on amiodarone (INR did not stay therapeutic throught the pre-DCCV period so needed TEE).  He converted to NSR.  I did admit him after cardioversion for diuresis.  TEE prior to DCCV confirmed EF 35-40% with severe, post-infarction MR in the setting of severe hypokinesis to akinesis of the inferior and posterior walls. Repeat echo in NSR in 11/13 showed EF 45-50% with severe MR.  Most recent echo in 5/14 showed EF 45-50% with only mild MR.   Mr [name] [name] developed a right supraclavicular solid neck mass that may be lung adenocarcinoma primary, though no obvious primary lung site was noted on imaging.  He has had radiation directed to the supraclavicular mass and had recent treatment with carboplatin and Alimta.   He was admitted in 10/14 after with dehydration and AKI (creatinine to 5.9) post-chemotherapy.  He had no appetite and very poor po intake.  In this setting, he went back into atrial fibrillation with RVR.  His rate was initially difficult to control and I had planned to try to cardiovert him back to NSR given resting HR in the 130s.  However, he was admitted in 12/14 with a UTI and weakness.  With medication adjustment, his HR decreased to the 90s-100s, so it was decided not to cardiovert him.  Given focus at this point on comfort care, coumadin was stopped.  He decided against hospice involvement for now but is open to hospice in the future.   Currently, HR is running in the 100s in atrial fibrillation.  Patient is able to walk in his house without dyspnea, but he is short of breath walking in stores.  He uses a walker when he goes out of the house. No chest pain, no lightheadedness.  He does not feel palpitations.  BP is stable.    Labs (9/12): LDL 81, HDL 54, K 4.2, creatinine 1.9, BNP 1065 Labs (11/12): K 4.1, creatinine 1.7, BNP 837 Labs (1/13): K 3.7, creatinine 1.6, HDL 52, LDL 100 Labs (5/13): K 4.4, creatinine 1.5, HDL 47, LDL 76 Labs (7/13): creatinine 2.0 Labs (8/13): K 3.9, creatinine 1.9, BNP 1066 Labs (9/13): K 3.5, creatinine 2.4=>2.3, BNP 971, LFTs normal, TSH normal Labs (10/13): K 4.6, creatinine  1.87 Labs (11/13): K 4.5, creatinine 2.4, LDL 73, HDL 50 Labs (5/14): K 4.3, creatinine 1.95, LFTs normal, HCT 33.4 Labs (10/14): LFTs normal, TSH normal Labs (11/14): K 3.3, creatinine 5.9 => 2.3, HCT 23.7=>23.2, plts 9 => 60=>103 Labs (12/14): K 4.9, creatinine 2.49=>2.9, HCT 30.2, plts 72, LFTs normal  ECG: Atypical atrial flutter with rate 116   PMH: 1. Paroxysmal atrial fibrillation: Noted initially post-op CABG in 10/11. Back in atrial fibrillation in 8/13, amiodarone restarted along with coumadin.  TEE-guided DCCV in 9/13.  Back in atrial fibrillation in 10/14 with coumadin  held because of decreased plts.  2. Knee osteoarthritis 3. Anemia of uncertain etiology: had erythropoietin shots for a period of time.  4. CAD: MI at age 33.  Had CABG in 10/11 with LIMA-LAD, SVG-D, and SVG-RCA.  Lexiscan myoview (9/13) with EF 34%, large inferior infarct from base to apex with no ischemia.   5. Systolic CHF: Echo (1/12) with EF 50%, inferior and inferoseptal hypokinesis, mild LV hypertrophy, moderate MR, severe left atrial enlargement.  Echo [facility] hospital, 7/13): EF 35-40%, severe MR, moderate-severe LAE, PASP 50-60 mmHg.  TEE (9/13): EF 35-40%, posterior and inferior severe HK to AK, severe post-infarction MR, mildly decreased RV systolic function. Echo (11/13) with EF 45-50%, LV dilation, severe MR, mild RV dilation with moderate RV dysfunction.  6. Ulcerative colitis: s/p total colectomy with ileostomy.  7. HTN 8. Hyperlipidemia 9. Nephrolithiasis 10. CKD: H/o ARF/hyperkalemia in the setting of dehydration.  He is off ACEI and spironolactone.  11. MR: Severe on 7/13 echo and 9/13 TEE.  Suspect post-infarction MR in the setting of inferoposterior wall motion abnormality.  12. Gout 13. Metastatic lung adenocarcinoma with right supraclavicular mass diagnosed in 2014.  Patient had caroplatin/Alimta chemotherapy and tolerated poorly.   SH: Retired from [employer].  Married, lives in [place]. Stopped smoking in 1970.    FH: Strong history of CAD.  Father with MI at 8.  All siblings (11) have now passed away, many with heart disease.   ROS: All systems reviewed and negative except as per HPI.   Current Outpatient Prescriptions  Medication Sig Dispense Refill  . acetaminophen (TYLENOL) 500 MG tablet Take 500 mg by mouth every 6 (six) hours as needed for moderate pain.      [name] allopurinol (ZYLOPRIM) 100 MG tablet Take 100 mg by mouth at bedtime.       . AMBULATORY NON FORMULARY MEDICATION Medication Name:Four Wheel Rollator Walker with Fold Up Removable Back Support  1  each  0  . atorvastatin (LIPITOR) 40 MG tablet Take 20 mg by mouth every evening. 1/2 tablet daily      . cholecalciferol (VITAMIN D) 1000 UNITS tablet Take 1,000 Units by mouth every morning.       . dronabinol (MARINOL) 2.5 MG capsule Take 1 capsule (2.5 mg total) by mouth 2 (two) times daily before lunch and supper.  60 capsule  0  . feeding supplement, RESOURCE BREEZE, (RESOURCE BREEZE) LIQD Take 1 Container by mouth 2 (two) times daily with a meal.  60 Container  0  . ferrous sulfate 325 (65 FE) MG tablet Take 325 mg by mouth every evening.      . fish oil-omega-3 fatty acids 1000 MG capsule Take 1 g by mouth 2 (two) times daily.       [name] FLAXSEED, LINSEED, PO Take 1 tablet by mouth at bedtime.       . folic acid (FOLVITE) 400 MCG tablet Take 400  mcg by mouth every morning.       . nitroGLYCERIN (NITROSTAT) 0.4 MG SL tablet Place 1 tablet (0.4 mg total) under the tongue every 5 (five) minutes as needed for chest pain.  10 tablet  0  . promethazine (PHENERGAN) 25 MG tablet Take 1 tablet (25 mg total) by mouth every 6 (six) hours as needed for nausea or vomiting.  30 tablet  0  . Tamsulosin HCl (FLOMAX) 0.4 MG CAPS Take 0.4 mg by mouth daily after supper.       . furosemide (LASIX) 40 MG tablet TAKE 1 TABLET BY MOUTH EVERY OTHER DAY  30 tablet  3  . metoprolol succinate (TOPROL-XL) 25 MG 24 hr tablet Take 1 tablet (25 mg total) by mouth 2 (two) times daily.  60 tablet  11  . [DISCONTINUED] pantoprazole (PROTONIX) 40 MG tablet Take 40 mg by mouth daily.         No current facility-administered medications for this visit.   Facility-Administered Medications Ordered in Other Visits  Medication Dose Route Frequency Provider Last Rate Last Dose  . heparin lock flush 100 unit/mL  250 Units Intracatheter PRN Josph Macho, MD      . sodium chloride 0.9 % injection 10 mL  10 mL Intracatheter PRN Josph Macho, MD        BP 110/64  Pulse 116  Ht 5\' 4"  (1.626 m)  Wt 62.596 kg (138 lb)  BMI  23.68 kg/m2 General: NAD Neck: JVP 8 cm, no thyromegaly or thyroid nodule.  Lungs: Slightly decreased breath sounds at bases bilaterally. CV: Nondisplaced PMI.  Heart mildly tachy, irregular S1/S2, no S3/S4, 1/6 HSM at apex. 1+ edema 1/3 to knees bilaterally.  No carotid bruit.  Abdomen: Soft, nontender, no hepatosplenomegaly, no distention.  Neurologic: Alert and oriented x 3.  Psych: Normal affect. Extremities: No clubbing or cyanosis.  Skin: Solid mass at right supraclavicular area  Assessment/Plan  1. Atrial fibrillation  HR is under better control now (mainly 90s-100s), appears to be atypical atrial flutter. We've decided to hold off on DCCV. Therefore, given focus primarily on comfort, he is off coumadin.  Today, I will have him stop amiodarone and increase Toprol XL to 25 mg bid.   2. Chronic systolic CHF  EF 82-95% on most recent echo, suspect ischemic cardiomyopathy plays major role. NYHA class III.  He appears more volume overloaded than at last appointment.  - He is off spironolactone and ACEI after ARF with hyperkalemia and given baseline significantly CKD.   - Hydralazine/Imdur and Coreg have been held with low blood pressure recently.  He has tolerated Toprol XL.  - I will add Lasix 40 mg every other day.  Will get BMET today and again in 2 wks.  3. HYPERLIPIDEMIA  Continue atorvastatin with known CAD.   4. Mitral regurgitation  This appeared to be post-infarction MR with inferoposterior severe hypokinesis to akinesis.  Most recent echo was read as showing mild MR.   5. CAD  Status post CABG.  Last [facility] showed dense inferior scar with no ischemia.  Continue medical management.  Continue statin.   6. CKD  Creatinine still elevated but improved from peak. Watch carefully with diuresis. 7. Focus at this point will be on comfort.  Mr Creps is open to hospice eventually.  He does not feel like he needs hospice yet.   [name] 12/23/2013

## 2013-12-23 NOTE — Patient Instructions (Addendum)
Your physician has recommended you make the following change in your medication:  1) STOP Amiodarone 2) Increase Metoprolol to 25mg  twice daily 3)Start Lasix 40mg .Take 1 tablet every other day  Lab Today: B[facility]  Your physician recommends that you return for lab work in: 2 weeks ([facility])  Your physician recommends that you schedule a follow-up appointment in: 1 month

## 2013-12-24 ENCOUNTER — Encounter (HOSPITAL_COMMUNITY): Payer: Medicare Other

## 2013-12-24 ENCOUNTER — Telehealth: Payer: Self-pay | Admitting: Cardiology

## 2013-12-24 ENCOUNTER — Encounter (HOSPITAL_COMMUNITY)
Admission: RE | Admit: 2013-12-24 | Discharge: 2013-12-24 | Disposition: A | Discharge status: Home / Self Care | Payer: Medicare Other | Source: Ambulatory Visit | Attending: Hematology & Oncology | Admitting: Hematology & Oncology

## 2013-12-24 DIAGNOSIS — D631 Anemia in chronic kidney disease: Secondary | ICD-10-CM

## 2013-12-24 DIAGNOSIS — N189 Chronic kidney disease, unspecified: Secondary | ICD-10-CM

## 2013-12-24 DIAGNOSIS — C7800 Secondary malignant neoplasm of unspecified lung: Secondary | ICD-10-CM | POA: Insufficient documentation

## 2013-12-24 DIAGNOSIS — C3490 Malignant neoplasm of unspecified part of unspecified bronchus or lung: Secondary | ICD-10-CM | POA: Insufficient documentation

## 2013-12-24 DIAGNOSIS — C50919 Malignant neoplasm of unspecified site of unspecified female breast: Secondary | ICD-10-CM | POA: Insufficient documentation

## 2013-12-24 DIAGNOSIS — N281 Cyst of kidney, acquired: Secondary | ICD-10-CM | POA: Insufficient documentation

## 2013-12-24 DIAGNOSIS — N400 Enlarged prostate without lower urinary tract symptoms: Secondary | ICD-10-CM | POA: Insufficient documentation

## 2013-12-24 DIAGNOSIS — Z933 Colostomy status: Secondary | ICD-10-CM | POA: Insufficient documentation

## 2013-12-24 DIAGNOSIS — J984 Other disorders of lung: Secondary | ICD-10-CM | POA: Insufficient documentation

## 2013-12-24 DIAGNOSIS — C799 Secondary malignant neoplasm of unspecified site: Secondary | ICD-10-CM

## 2013-12-24 LAB — GLUCOSE, CAPILLARY: Glucose-Capillary: 66 mg/dL — ABNORMAL LOW (ref 70–99)

## 2013-12-24 MED ORDER — FLUDEOXYGLUCOSE F - 18 (FDG) INJECTION
19.0000 | Freq: Once | INTRAVENOUS | Status: AC | PRN
  Administered 2013-12-24: 19 via INTRAVENOUS

## 2013-12-24 NOTE — Telephone Encounter (Signed)
Returned call to patient lab results given. 

## 2013-12-24 NOTE — Telephone Encounter (Signed)
New message ° ° ° ° ° ° °Pt returning nurse call  °

## 2013-12-26 ENCOUNTER — Ambulatory Visit (HOSPITAL_COMMUNITY)
Admission: RE | Admit: 2013-12-26 | Discharge: 2013-12-26 | Disposition: A | Discharge status: Home / Self Care | Payer: Medicare Other | Source: Ambulatory Visit | Attending: Hematology & Oncology | Admitting: Hematology & Oncology

## 2013-12-30 ENCOUNTER — Other Ambulatory Visit (HOSPITAL_COMMUNITY): Payer: Medicare Other

## 2013-12-30 ENCOUNTER — Other Ambulatory Visit: Payer: Self-pay | Admitting: *Deleted

## 2013-12-30 MED ORDER — AMBULATORY NON FORMULARY MEDICATION
Status: DC
Start: 2013-12-30 — End: 2014-01-07

## 2014-01-07 ENCOUNTER — Encounter: Payer: Self-pay | Admitting: *Deleted

## 2014-01-07 ENCOUNTER — Ambulatory Visit (HOSPITAL_BASED_OUTPATIENT_CLINIC_OR_DEPARTMENT_OTHER): Payer: Medicare Other | Admitting: Hematology & Oncology

## 2014-01-07 ENCOUNTER — Ambulatory Visit (HOSPITAL_BASED_OUTPATIENT_CLINIC_OR_DEPARTMENT_OTHER): Payer: Medicare Other

## 2014-01-07 ENCOUNTER — Encounter: Payer: Self-pay | Admitting: Hematology & Oncology

## 2014-01-07 ENCOUNTER — Telehealth: Payer: Self-pay | Admitting: Hematology & Oncology

## 2014-01-07 ENCOUNTER — Other Ambulatory Visit (HOSPITAL_BASED_OUTPATIENT_CLINIC_OR_DEPARTMENT_OTHER): Payer: Medicare Other | Admitting: Lab

## 2014-01-07 VITALS — BP 110/68 | HR 64 | Temp 97.8°F | Resp 18 | Ht 63.0 in | Wt 146.0 lb

## 2014-01-07 DIAGNOSIS — T451X5A Adverse effect of antineoplastic and immunosuppressive drugs, initial encounter: Principal | ICD-10-CM

## 2014-01-07 DIAGNOSIS — D631 Anemia in chronic kidney disease: Secondary | ICD-10-CM

## 2014-01-07 DIAGNOSIS — D61810 Antineoplastic chemotherapy induced pancytopenia: Secondary | ICD-10-CM

## 2014-01-07 DIAGNOSIS — N039 Chronic nephritic syndrome with unspecified morphologic changes: Secondary | ICD-10-CM

## 2014-01-07 DIAGNOSIS — N189 Chronic kidney disease, unspecified: Secondary | ICD-10-CM

## 2014-01-07 DIAGNOSIS — I4891 Unspecified atrial fibrillation: Secondary | ICD-10-CM

## 2014-01-07 DIAGNOSIS — D509 Iron deficiency anemia, unspecified: Secondary | ICD-10-CM

## 2014-01-07 DIAGNOSIS — C3490 Malignant neoplasm of unspecified part of unspecified bronchus or lung: Secondary | ICD-10-CM

## 2014-01-07 DIAGNOSIS — N183 Chronic kidney disease, stage 3 unspecified: Secondary | ICD-10-CM

## 2014-01-07 DIAGNOSIS — D638 Anemia in other chronic diseases classified elsewhere: Secondary | ICD-10-CM

## 2014-01-07 DIAGNOSIS — C799 Secondary malignant neoplasm of unspecified site: Secondary | ICD-10-CM

## 2014-01-07 DIAGNOSIS — N179 Acute kidney failure, unspecified: Secondary | ICD-10-CM

## 2014-01-07 LAB — CBC WITH DIFFERENTIAL (CANCER CENTER ONLY)
BASO#: 0 10*3/uL (ref 0.0–0.2)
BASO%: 0.1 % (ref 0.0–2.0)
EOS%: 0.3 % (ref 0.0–7.0)
Eosinophils Absolute: 0 10*3/uL (ref 0.0–0.5)
HEMATOCRIT: 31.8 % — AB (ref 38.7–49.9)
HGB: 10.2 g/dL — ABNORMAL LOW (ref 13.0–17.1)
LYMPH#: 0.4 10*3/uL — ABNORMAL LOW (ref 0.9–3.3)
LYMPH%: 5.9 % — AB (ref 14.0–48.0)
MCH: 35.5 pg — AB (ref 28.0–33.4)
MCHC: 32.1 g/dL (ref 32.0–35.9)
MCV: 111 fL — AB (ref 82–98)
MONO#: 0.8 10*3/uL (ref 0.1–0.9)
MONO%: 10 % (ref 0.0–13.0)
NEUT#: 6.3 10*3/uL (ref 1.5–6.5)
NEUT%: 83.7 % — ABNORMAL HIGH (ref 40.0–80.0)
PLATELETS: 83 10*3/uL — AB (ref 145–400)
RBC: 2.87 10*6/uL — AB (ref 4.20–5.70)
RDW: 20.1 % — ABNORMAL HIGH (ref 11.1–15.7)
WBC: 7.5 10*3/uL (ref 4.0–10.0)

## 2014-01-07 LAB — CMP (CANCER CENTER ONLY)
ALT(SGPT): 14 U/L (ref 10–47)
AST: 19 U/L (ref 11–38)
Albumin: 2.1 g/dL — ABNORMAL LOW (ref 3.3–5.5)
Alkaline Phosphatase: 54 U/L (ref 26–84)
BUN: 43 mg/dL — AB (ref 7–22)
CALCIUM: 8.2 mg/dL (ref 8.0–10.3)
CO2: 19 meq/L (ref 18–33)
CREATININE: 2.4 mg/dL — AB (ref 0.6–1.2)
Chloride: 107 mEq/L (ref 98–108)
Glucose, Bld: 126 mg/dL — ABNORMAL HIGH (ref 73–118)
POTASSIUM: 4.3 meq/L (ref 3.3–4.7)
Sodium: 134 mEq/L (ref 128–145)
Total Bilirubin: 0.7 mg/dl (ref 0.20–1.60)
Total Protein: 5.2 g/dL — ABNORMAL LOW (ref 6.4–8.1)

## 2014-01-07 LAB — PREALBUMIN: Prealbumin: 13.6 mg/dL — ABNORMAL LOW (ref 17.0–34.0)

## 2014-01-07 LAB — LACTATE DEHYDROGENASE: LDH: 187 U/L (ref 94–250)

## 2014-01-07 MED ORDER — SODIUM CHLORIDE 0.9 % IJ SOLN
10.0000 mL | Freq: Once | INTRAMUSCULAR | Status: AC
  Administered 2014-01-07: 10 mL via INTRAVENOUS
  Filled 2014-01-07: qty 10

## 2014-01-07 MED ORDER — DARBEPOETIN ALFA-POLYSORBATE 300 MCG/0.6ML IJ SOLN
300.0000 ug | Freq: Once | INTRAMUSCULAR | Status: AC
  Administered 2014-01-07: 300 ug via SUBCUTANEOUS

## 2014-01-07 MED ORDER — DARBEPOETIN ALFA-POLYSORBATE 300 MCG/0.6ML IJ SOLN
INTRAMUSCULAR | Status: AC
  Filled 2014-01-07: qty 0.6

## 2014-01-07 MED ORDER — HEPARIN SOD (PORK) LOCK FLUSH 100 UNIT/ML IV SOLN
500.0000 [IU] | Freq: Once | INTRAVENOUS | Status: AC
  Administered 2014-01-07: 500 [IU] via INTRAVENOUS
  Filled 2014-01-07: qty 5

## 2014-01-07 NOTE — Patient Instructions (Signed)
Darbepoetin Alfa injection What is this medicine? DARBEPOETIN ALFA (dar be POE e tin AL fa) helps your body make more red blood cells. It is used to treat anemia caused by chronic kidney failure and chemotherapy. This medicine may be used for other purposes; ask your health care provider or pharmacist if you have questions. COMMON BRAND NAME(S): Aranesp What should I tell my health care provider before I take this medicine? They need to know if you have any of these conditions: -blood clotting disorders or history of blood clots -cancer patient not on chemotherapy -cystic fibrosis -heart disease, such as angina, heart failure, or a history of a heart attack -hemoglobin level of 12 g/dL or greater -high blood pressure -low levels of folate, iron, or vitamin B12 -seizures -an unusual or allergic reaction to darbepoetin, erythropoietin, albumin, hamster proteins, latex, other medicines, foods, dyes, or preservatives -pregnant or trying to get pregnant -breast-feeding How should I use this medicine? This medicine is for injection into a vein or under the skin. It is usually given by a health care professional in a hospital or clinic setting. If you get this medicine at home, you will be taught how to prepare and give this medicine. Do not shake the solution before you withdraw a dose. Use exactly as directed. Take your medicine at regular intervals. Do not take your medicine more often than directed. It is important that you put your used needles and syringes in a special sharps container. Do not put them in a trash can. If you do not have a sharps container, call your pharmacist or healthcare provider to get one. Talk to your pediatrician regarding the use of this medicine in children. While this medicine may be used in children as young as 1 year for selected conditions, precautions do apply. Overdosage: If you think you have taken too much of this medicine contact a poison control center or  emergency room at once. NOTE: This medicine is only for you. Do not share this medicine with others. What if I miss a dose? If you miss a dose, take it as soon as you can. If it is almost time for your next dose, take only that dose. Do not take double or extra doses. What may interact with this medicine? Do not take this medicine with any of the following medications: -epoetin alfa This list may not describe all possible interactions. Give your health care provider a list of all the medicines, herbs, non-prescription drugs, or dietary supplements you use. Also tell them if you smoke, drink alcohol, or use illegal drugs. Some items may interact with your medicine. What should I watch for while using this medicine? Visit your prescriber or health care professional for regular checks on your progress and for the needed blood tests and blood pressure measurements. It is especially important for the doctor to make sure your hemoglobin level is in the desired range, to limit the risk of potential side effects and to give you the best benefit. Keep all appointments for any recommended tests. Check your blood pressure as directed. Ask your doctor what your blood pressure should be and when you should contact him or her. As your body makes more red blood cells, you may need to take iron, folic acid, or vitamin B supplements. Ask your doctor or health care provider which products are right for you. If you have kidney disease continue dietary restrictions, even though this medication can make you feel better. Talk with your doctor or health   care professional about the foods you eat and the vitamins that you take. What side effects may I notice from receiving this medicine? Side effects that you should report to your doctor or health care professional as soon as possible: -allergic reactions like skin rash, itching or hives, swelling of the face, lips, or tongue -breathing problems -changes in vision -chest  pain -confusion, trouble speaking or understanding -feeling faint or lightheaded, falls -high blood pressure -muscle aches or pains -pain, swelling, warmth in the leg -rapid weight gain -severe headaches -sudden numbness or weakness of the face, arm or leg -trouble walking, dizziness, loss of balance or coordination -seizures (convulsions) -swelling of the ankles, feet, hands -unusually weak or tired Side effects that usually do not require medical attention (report to your doctor or health care professional if they continue or are bothersome): -diarrhea -fever, chills (flu-like symptoms) -headaches -nausea, vomiting -redness, stinging, or swelling at site where injected This list may not describe all possible side effects. Call your doctor for medical advice about side effects. You may report side effects to FDA at 1-800-FDA-1088. Where should I keep my medicine? Keep out of the reach of children. Store in a refrigerator between 2 and 8 degrees C (36 and 46 degrees F). Do not freeze. Do not shake. Throw away any unused portion if using a single-dose vial. Throw away any unused medicine after the expiration date. NOTE: This sheet is a summary. It may not cover all possible information. If you have questions about this medicine, talk to your doctor, pharmacist, or health care provider.  2014, Elsevier/Gold Standard. (2008-11-24 10:23:57)  

## 2014-01-07 NOTE — Telephone Encounter (Signed)
[name] [name] in scheduling is aware of biopsy order said would call pt to schedule after review. Pt is aware

## 2014-01-07 NOTE — Progress Notes (Signed)
This office note has been dictated.

## 2014-01-07 NOTE — Addendum Note (Signed)
Addended by: Trevor Mace on: 01/07/2014 05:25 PM   Modules accepted: Orders

## 2014-01-07 NOTE — Progress Notes (Signed)
Changed patient pharmacy per dr. Niel Hummer request

## 2014-01-08 ENCOUNTER — Telehealth: Payer: Self-pay | Admitting: *Deleted

## 2014-01-08 ENCOUNTER — Other Ambulatory Visit: Payer: Self-pay | Admitting: Radiology

## 2014-01-08 NOTE — Telephone Encounter (Signed)
Called [facility] to make referral for patient to see them for deconditioning.  Patient already had refereral on file. They will call patient to set up appt.

## 2014-01-08 NOTE — Progress Notes (Signed)
CC:   [name] [name], M.D.  DIAGNOSES: 1. Metastatic adenocarcinoma of the lung. 2. Anemia secondary to renal insufficiency. 3. Chronic atrial fibrillation.  CURRENT THERAPY:  Aranesp 300 mcg subcu q.3 weeks for hemoglobin less than 10.  INTERIM HISTORY:  [name] [name] comes in for his followup.  He continues to improve.  The Aranesp really has helped quite a bit.  We gave him Aranesp and his hemoglobin is improving nicely.  Of note, he also has a very low testosterone level.  We gave him a dose of testosterone in the hospital.  Again, I think this probably is helping him out.  His quality of life is a little better right now.  We did go and repeat a PET scan on him.  This was done on 12/31.  The PET scan did show some growth in his right supraclavicular mass.  Also noticed some growth in his right superior mediastinal lymph node group. There is a new nodule in the right lower lung.  He does have a necrotic lesion in the right buttock which appears to be larger.  However, the activity seems to be less.  He does not have any bony involvement.  He uses a walker.  He gets around fairly well.  Apparently, his kidney function is also improving.  Currently, his performance status is ECOG 2-3.  His appetite is a little better.  His wife thinks he finally is eating a little bit more.  His last prealbumin in December was 10.4.  PHYSICAL EXAMINATION:  General:  This is an elderly white gentleman in no obvious distress.  Vital Signs:  Temperature of 98.6, pulse 64, respiratory rate 18, blood pressure 110/60.  Weight is 146 pounds.  Head and Neck: Normocephalic, atraumatic skull.  There are no ocular or oral lesions.  He does have the palpable right supraclavicular lymph node. It probably measures about 4 cm.  It is nontender and mobile.  Lungs: Clear bilaterally.  He has no rales, wheezes, or rhonchi.  Cardiac: Irregular rate and irregular rhythm consistent with atrial  fibrillation. He has a 1/6 systolic ejection murmur. Abdomen:  Soft.  He has decent bowel sounds.  There is no fluid wave. There is no palpable abdominal mass.  There is no palpable hepatosplenomegaly.  Back:  Shows no tenderness over the spine, ribs, or hips.  Buttock exam shows no obvious mass in the right gluteal region. Extremities:  Show 2+ edema in his lower legs.  He has decent pulses in his feet.  Skin:  Shows some scattered actinic keratoses.  LABORATORY STUDIES:  White cell count is 7.5, hemoglobin 10.2, hematocrit 31.8, platelet count 83,000.  BUN is 49, creatinine 2.4. Calcium 8.2.  Albumin is 2.1.  IMPRESSION:  [name] [name] is a nice 80 year old gentleman.  He has metastatic adenocarcinoma of the lung.  We diagnosed this as lung cancer via genetic array analysis.  Unfortunately, we could not get his genetic markers.  I think that now would be good time to do another biopsy and see if can see if he has genetic mutation in one of the driver genes.  I spoke to him and his wife about this.  They are in favor of this.  I think that since this seems to be improving with his functional state, it might be worthwhile to see if he has a genetic mutation that we might be able to use one of our oral agents for.  I just wanted to give [name] [name] a chance.  I  realize that he does have other health issues.  However, since he is improving slowly but surely, I think it is worthwhile to see if his tumor does not contain any genetic mutation.  He has not smoked for over, I think, 50 years.  We will continue to give him the Aranesp.  We will recheck his testosterone, see if he has having extra testosterone.  We will see about getting a biopsy this week.  I will plan to get him back in another 3 weeks or so.    ______________________________ Volanda Napoleon, M.D. PRE/MEDQ  D:  01/07/2014  T:  01/08/2014  Job:  [id number]

## 2014-01-09 ENCOUNTER — Other Ambulatory Visit: Payer: Self-pay | Admitting: Radiology

## 2014-01-09 ENCOUNTER — Telehealth: Payer: Self-pay | Admitting: Cardiology

## 2014-01-09 NOTE — Telephone Encounter (Signed)
New message     Has  Dr Aundra Dubin called hospice to come to pts home?

## 2014-01-09 NOTE — Telephone Encounter (Signed)
I was waiting to hear from him if he definitely wanted the referral. If he does, we can make it.

## 2014-01-09 NOTE — Telephone Encounter (Signed)
[name] [name] from [facility] hospice contacted for pt's referral. Pt is aware that someone for hospice  will contact him. Pt verbalized understanding.

## 2014-01-09 NOTE — Telephone Encounter (Signed)
Pt called to see if Dr. Aundra [name] has referred pt to Hospice. Pt states that on the last office visit with MD mention that pt was going to be referred to hospice. Pt called hospice and they do not have any records that pt has been referred. A   month return  appointment was made for pt with Dr. Aundra [name] on 01/29/14. Pt would like to know regarding this referral soon.

## 2014-01-12 ENCOUNTER — Ambulatory Visit: Payer: Medicare Other | Admitting: Physical Therapy

## 2014-01-13 ENCOUNTER — Ambulatory Visit (HOSPITAL_COMMUNITY)
Admission: RE | Admit: 2014-01-13 | Discharge: 2014-01-13 | Disposition: A | Discharge status: Home / Self Care | Payer: Medicare Other | Source: Ambulatory Visit | Attending: Hematology & Oncology | Admitting: Hematology & Oncology

## 2014-01-13 ENCOUNTER — Other Ambulatory Visit: Payer: Self-pay | Admitting: Hematology & Oncology

## 2014-01-13 ENCOUNTER — Encounter (HOSPITAL_COMMUNITY): Payer: Self-pay

## 2014-01-13 DIAGNOSIS — D649 Anemia, unspecified: Secondary | ICD-10-CM | POA: Insufficient documentation

## 2014-01-13 DIAGNOSIS — C799 Secondary malignant neoplasm of unspecified site: Secondary | ICD-10-CM

## 2014-01-13 DIAGNOSIS — R599 Enlarged lymph nodes, unspecified: Secondary | ICD-10-CM | POA: Insufficient documentation

## 2014-01-13 DIAGNOSIS — I2510 Atherosclerotic heart disease of native coronary artery without angina pectoris: Secondary | ICD-10-CM | POA: Insufficient documentation

## 2014-01-13 DIAGNOSIS — Z87891 Personal history of nicotine dependence: Secondary | ICD-10-CM | POA: Insufficient documentation

## 2014-01-13 DIAGNOSIS — I509 Heart failure, unspecified: Secondary | ICD-10-CM | POA: Insufficient documentation

## 2014-01-13 DIAGNOSIS — I4891 Unspecified atrial fibrillation: Secondary | ICD-10-CM | POA: Insufficient documentation

## 2014-01-13 DIAGNOSIS — I252 Old myocardial infarction: Secondary | ICD-10-CM | POA: Insufficient documentation

## 2014-01-13 DIAGNOSIS — Z951 Presence of aortocoronary bypass graft: Secondary | ICD-10-CM | POA: Insufficient documentation

## 2014-01-13 DIAGNOSIS — I10 Essential (primary) hypertension: Secondary | ICD-10-CM | POA: Insufficient documentation

## 2014-01-13 DIAGNOSIS — Z932 Ileostomy status: Secondary | ICD-10-CM | POA: Insufficient documentation

## 2014-01-13 DIAGNOSIS — D638 Anemia in other chronic diseases classified elsewhere: Secondary | ICD-10-CM

## 2014-01-13 DIAGNOSIS — Z79899 Other long term (current) drug therapy: Secondary | ICD-10-CM | POA: Insufficient documentation

## 2014-01-13 DIAGNOSIS — E785 Hyperlipidemia, unspecified: Secondary | ICD-10-CM | POA: Insufficient documentation

## 2014-01-13 DIAGNOSIS — Z87442 Personal history of urinary calculi: Secondary | ICD-10-CM | POA: Insufficient documentation

## 2014-01-13 DIAGNOSIS — C801 Malignant (primary) neoplasm, unspecified: Secondary | ICD-10-CM | POA: Insufficient documentation

## 2014-01-13 DIAGNOSIS — M109 Gout, unspecified: Secondary | ICD-10-CM | POA: Insufficient documentation

## 2014-01-13 DIAGNOSIS — I739 Peripheral vascular disease, unspecified: Secondary | ICD-10-CM | POA: Insufficient documentation

## 2014-01-13 DIAGNOSIS — C770 Secondary and unspecified malignant neoplasm of lymph nodes of head, face and neck: Secondary | ICD-10-CM | POA: Insufficient documentation

## 2014-01-13 LAB — CBC
HCT: 33 % — ABNORMAL LOW (ref 39.0–52.0)
HEMOGLOBIN: 10.7 g/dL — AB (ref 13.0–17.0)
MCH: 35 pg — AB (ref 26.0–34.0)
MCHC: 32.4 g/dL (ref 30.0–36.0)
MCV: 107.8 fL — ABNORMAL HIGH (ref 78.0–100.0)
PLATELETS: 87 10*3/uL — AB (ref 150–400)
RBC: 3.06 MIL/uL — AB (ref 4.22–5.81)
RDW: 19.9 % — ABNORMAL HIGH (ref 11.5–15.5)
WBC: 7.7 10*3/uL (ref 4.0–10.5)

## 2014-01-13 LAB — PROTIME-INR
INR: 1.27 (ref 0.00–1.49)
PROTHROMBIN TIME: 15.6 s — AB (ref 11.6–15.2)

## 2014-01-13 LAB — APTT: aPTT: 66 seconds — ABNORMAL HIGH (ref 24–37)

## 2014-01-13 MED ORDER — FENTANYL CITRATE 0.05 MG/ML IJ SOLN
INTRAMUSCULAR | Status: AC
  Filled 2014-01-13: qty 4

## 2014-01-13 MED ORDER — FLUMAZENIL 0.5 MG/5ML IV SOLN
INTRAVENOUS | Status: AC
  Filled 2014-01-13: qty 5

## 2014-01-13 MED ORDER — MIDAZOLAM HCL 2 MG/2ML IJ SOLN
INTRAMUSCULAR | Status: AC
  Filled 2014-01-13: qty 4

## 2014-01-13 MED ORDER — FENTANYL CITRATE 0.05 MG/ML IJ SOLN
INTRAMUSCULAR | Status: AC | PRN
  Administered 2014-01-13: 100 ug via INTRAVENOUS

## 2014-01-13 MED ORDER — NALOXONE HCL 0.4 MG/ML IJ SOLN
INTRAMUSCULAR | Status: AC
  Filled 2014-01-13: qty 1

## 2014-01-13 MED ORDER — SODIUM CHLORIDE 0.9 % IV SOLN
Freq: Once | INTRAVENOUS | Status: AC
  Administered 2014-01-13: 20 mL/h via INTRAVENOUS

## 2014-01-13 MED ORDER — HEPARIN SOD (PORK) LOCK FLUSH 100 UNIT/ML IV SOLN
500.0000 [IU] | Freq: Once | INTRAVENOUS | Status: AC
  Administered 2014-01-13: 500 [IU] via INTRAVENOUS
  Filled 2014-01-13 (×2): qty 5

## 2014-01-13 MED ORDER — MIDAZOLAM HCL 2 MG/2ML IJ SOLN
INTRAMUSCULAR | Status: AC | PRN
  Administered 2014-01-13: 1 mg via INTRAVENOUS

## 2014-01-13 NOTE — H&P (Signed)
Chief Complaint: "I'm here for another biopsy" Referring Physician:Ennever HPI: [name] [name] is an 80 y.o. male with findings of a large rt supraclavicular mass. He underwent biopsy back in 6/14 which showed metastatic adenocarcinoma. He's undergone some treatment and had another PET scan. The right supraclavicular mass has slightly increased in size. Dr. Marin Olp would like repeat biopsy to send for genetic markers. PMHx and meds reviewed. Pt has been off Coumadin for the past few weeks and has not been on any Lovenox/heparin.   Past Medical History:  Past Medical History  Diagnosis Date  . Coronary artery disease   . MI, old     AGE 40  . Ulcerative colitis   . Hyperlipidemia   . Hypertension   . History of nephrolithiasis   . PAF (paroxysmal atrial fibrillation)   . LV dysfunction   . Myocardial infarction   . Anemia   . Blood transfusion   . Arthritis   . CHF (congestive heart failure)   . Mitral regurgitation   . Pneumonia     years ago  . Peripheral vascular disease   . 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 Acute renal failure     "related ? Kidney stone blockage"  . Gout flare     right index finger-tx. prednisone, allopurinol  . Swelling 05-08-13    rt. base of neck-something recent.  . Cancer 05/29/13    metastatic adenocarcinoma of right neck mass  . Metastatic adenocarcinoma 05/29/13    right supraclavicular area    Past Surgical History:  Past Surgical History  Procedure Laterality Date  . Cardiac catheterization  10/14/2010    EF 30%  . Coronary artery bypass graft  10/24/2010    LIMA TO THE LAD, A SAPHENOUS VEIN GRAFT TO THE DIAGONAL 1 AND 3, AND SAPHENOUS VEIN GRAFT TO THE DISTAL RIGHT CORONARY ARTERY  . Ileostomy  04/23/73  . Colectomy    . Cataract extraction    . Transthoracic echocardiogram  01/16/2011    EF 50%  . Cardiovascular stress test  10/07/2010    EF 33%  . Appendectomy    . Cystostomy  04/22/2012    Procedure: CYSTOSTOMY  SUPRAPUBIC;  Surgeon: Claybon Jabs, MD;  Location: [facility];  Service: Urology;  Laterality: N/A;  CYSTOTOMY WITH MIDLINE INCISION   . Tee without cardioversion  08/28/2012    Procedure: TRANSESOPHAGEAL ECHOCARDIOGRAM (TEE);  Surgeon: Larey Dresser, MD;  Location: [facility];  Service: Cardiovascular;  Laterality: N/A;  . Cardioversion  08/28/2012    Procedure: CARDIOVERSION;  Surgeon: Larey Dresser, MD;  Location: [facility];  Service: Cardiovascular;  Laterality: N/A;  . Cystoscopy w/ ureteral stent placement Right 05/12/2013    Procedure: CYSTOSCOPY WITH RIGHT RETROGRADE PYELOGRAM/ RIGHT URETERAL STENT PLACEMENT;  Surgeon: Claybon Jabs, MD;  Location: [facility];  Service: Urology;  Laterality: Right;    Family History:  Family History  Problem Relation Age of Onset  . Colon cancer Mother     colon  . Heart attack Father   . Hypertension Father   . Heart disease Sister   . Hypertension Sister   . Heart disease Brother   . Hypertension Brother     Social History:  reports that he quit smoking about 45 years ago. His smoking use included Cigarettes and Cigars. He has a 15 pack-year smoking history. He has never used smokeless tobacco. He reports that he does not drink alcohol or use illicit drugs.  Allergies: No Known Allergies  Medications:   Medication List    ASK your doctor about these medications       acetaminophen 500 MG tablet  Commonly known as:  TYLENOL  Take 500 mg by mouth every 6 (six) hours as needed for moderate pain.     allopurinol 100 MG tablet  Commonly known as:  ZYLOPRIM  Take 100 mg by mouth at bedtime.     atorvastatin 40 MG tablet  Commonly known as:  LIPITOR  Take 20 mg by mouth every evening. 1/2 tablet daily     cholecalciferol 1000 UNITS tablet  Commonly known as:  VITAMIN D  Take 1,000 Units by mouth every morning.     dronabinol 2.5 MG capsule  Commonly known as:  MARINOL  Take 1 capsule (2.5 mg total) by mouth 2 (two) times daily  before lunch and supper.     feeding supplement (RESOURCE BREEZE) Liqd  Take 1 Container by mouth 2 (two) times daily with a meal.     ferrous sulfate 325 (65 FE) MG tablet  Take 325 mg by mouth every evening.     fish oil-omega-3 fatty acids 1000 MG capsule  Take 1 g by mouth 2 (two) times daily.     FLAXSEED (LINSEED) PO  Take 1 tablet by mouth at bedtime.     folic acid 409 MCG tablet  Commonly known as:  FOLVITE  Take 400 mcg by mouth every morning.     furosemide 40 MG tablet  Commonly known as:  LASIX  TAKE 1 TABLET BY MOUTH EVERY OTHER DAY     metoprolol succinate 25 MG 24 hr tablet  Commonly known as:  TOPROL-XL  Take 1 tablet (25 mg total) by mouth 2 (two) times daily.     nitroGLYCERIN 0.4 MG SL tablet  Commonly known as:  NITROSTAT  Place 1 tablet (0.4 mg total) under the tongue every 5 (five) minutes as needed for chest pain.     promethazine 25 MG tablet  Commonly known as:  PHENERGAN  Take 1 tablet (25 mg total) by mouth every 6 (six) hours as needed for nausea or vomiting.     tamsulosin 0.4 MG Caps capsule  Commonly known as:  FLOMAX  Take 0.4 mg by mouth daily after supper.        Please HPI for pertinent positives, otherwise complete 10 system ROS negative.  Physical Exam: BP 107/82  Pulse 54  Temp(Src) 97.4 F (36.3 C) (Oral)  Resp 18  SpO2 97% There is no weight on file to calculate BMI.   General Appearance:  Alert, cooperative, no distress, appears stated age  Head:  Normocephalic, without obvious abnormality, atraumatic  ENT: Unremarkable  Neck: Large palpable NT mass rt supraclavicular area  Lungs:   Clear to auscultation bilaterally, no w/r/r, respirations unlabored without use of accessory muscles.  Chest Wall:  No tenderness or deformity. (L)port site well healed, accessed.  Heart:  Regular rate and rhythm, S1, S2 normal, no murmur, rub or gallop.  Neurologic: Normal affect, no gross deficits.   Results for orders placed during  the hospital encounter of 01/13/14 (from the past 48 hour(s))  APTT     Status: Abnormal   Collection Time    01/13/14  8:25 AM      Result Value Range   aPTT 66 (*) 24 - 37 seconds   Comment:            IF BASELINE aPTT IS  ELEVATED,     SUGGEST PATIENT RISK ASSESSMENT     BE USED TO DETERMINE APPROPRIATE     ANTICOAGULANT THERAPY.  CBC     Status: Abnormal (Preliminary result)   Collection Time    01/13/14  8:25 AM      Result Value Range   WBC 7.7  4.0 - 10.5 K/uL   RBC 3.06 (*) 4.22 - 5.81 MIL/uL   Hemoglobin 10.7 (*) 13.0 - 17.0 g/dL   HCT 33.0 (*) 39.0 - 52.0 %   MCV 107.8 (*) 78.0 - 100.0 fL   MCH 35.0 (*) 26.0 - 34.0 pg   MCHC 32.4  30.0 - 36.0 g/dL   RDW 19.9 (*) 11.5 - 15.5 %   Platelets PENDING  150 - 400 K/uL  PROTIME-INR     Status: Abnormal   Collection Time    01/13/14  8:25 AM      Result Value Range   Prothrombin Time 15.6 (*) 11.6 - 15.2 seconds   INR 1.27  0.00 - 1.49   No results found.  Assessment/Plan Rt neck mass, metastatic adenocarcinoma. For US guided biopsy. Discussed procedure, risks, complications, use of sedation. Labs reviewed. Consent signed in chart  Ascencion Dike PA-C 01/13/2014, 8:50 AM

## 2014-01-13 NOTE — Procedures (Signed)
R neck LN biopsy No comp

## 2014-01-13 NOTE — Discharge Instructions (Signed)
Moderate Sedation, Adult Moderate sedation is given to help you relax or even sleep through a procedure. You may remain sleepy, be clumsy, or have poor balance for several hours following this procedure. Arrange for a responsible adult, family member, or friend to take you home. A responsible adult should stay with you for at least 24 hours or until the medicines have worn off.  Do not participate in any activities where you could become injured for the next 24 hours, or until you feel normal again. Do not:  Drive.  Swim.  Ride a bicycle.  Operate heavy machinery.  Cook.  Use power tools.  Climb ladders.  Work at [employer].  Do not make important decisions or sign legal documents until you are improved.  Vomiting may occur if you eat too soon. When you can drink without vomiting, try water, juice, or soup. Try solid foods if you feel little or no nausea.  Only take over-the-counter or prescription medications for pain, discomfort, or fever as directed by your caregiver.If pain medications have been prescribed for you, ask your caregiver how soon it is safe to take them.  Make sure you and your family fully understands everything about the medication given to you. Make sure you understand what side effects may occur.  You should not drink alcohol, take sleeping pills, or medications that cause drowsiness for at least 24 hours.  If you smoke, do not smoke alone.  If you are feeling better, you may resume normal activities 24 hours after receiving sedation.  Keep all appointments as scheduled. Follow all instructions.  Ask questions if you do not understand. SEEK MEDICAL CARE IF:   Your skin is pale or bluish in color.  You continue to feel sick to your stomach (nauseous) or throw up (vomit).  Your pain is getting worse and not helped by medication.  You have bleeding or swelling.  You are still sleepy or feeling clumsy after 24 hours. SEEK IMMEDIATE MEDICAL CARE IF:    You develop a rash.  You have difficulty breathing.  You develop any type of allergic problem.  You have a fever. Document Released: 09/05/2001 Document Revised: 03/04/2012 Document Reviewed: 08/18/2013 [facility] Patient Information 2014 [place].  May remove dressing in am, and then may shower, if any problems call your doctor.

## 2014-01-15 ENCOUNTER — Encounter: Payer: Medicare Other | Admitting: Physical Therapy

## 2014-01-16 ENCOUNTER — Telehealth: Payer: Self-pay | Admitting: *Deleted

## 2014-01-16 NOTE — Telephone Encounter (Signed)
Cory with [employer] [facility] calls and asked if you would be comfortable once they admit him would you sign claims and care plans.  Dr. Hassel Neth signed for him to enter into the program.

## 2014-01-16 NOTE — Telephone Encounter (Signed)
Yes, I am ok with it. Thak you

## 2014-01-20 ENCOUNTER — Encounter (HOSPITAL_COMMUNITY): Payer: Self-pay

## 2014-01-20 NOTE — Telephone Encounter (Signed)
Cory notified and states that nurse is going out to do initial eval today. Clemetine Marker, LPN

## 2014-01-21 ENCOUNTER — Telehealth: Payer: Self-pay | Admitting: *Deleted

## 2014-01-21 NOTE — Telephone Encounter (Signed)
Social Worker from [facility] called.  She talked to spouse about Hospice and wants to know if it is appropriate.  Yes per Dr. Marin Olp.

## 2014-01-22 ENCOUNTER — Encounter: Payer: Self-pay | Admitting: Cardiology

## 2014-01-22 ENCOUNTER — Ambulatory Visit (INDEPENDENT_AMBULATORY_CARE_PROVIDER_SITE_OTHER): Payer: Medicare Other | Admitting: Cardiology

## 2014-01-22 VITALS — BP 90/64 | HR 67 | Ht 64.0 in | Wt 139.0 lb

## 2014-01-22 DIAGNOSIS — I2510 Atherosclerotic heart disease of native coronary artery without angina pectoris: Secondary | ICD-10-CM

## 2014-01-22 DIAGNOSIS — I4891 Unspecified atrial fibrillation: Secondary | ICD-10-CM

## 2014-01-22 DIAGNOSIS — I059 Rheumatic mitral valve disease, unspecified: Secondary | ICD-10-CM

## 2014-01-22 DIAGNOSIS — I4819 Other persistent atrial fibrillation: Secondary | ICD-10-CM

## 2014-01-22 DIAGNOSIS — I340 Nonrheumatic mitral (valve) insufficiency: Secondary | ICD-10-CM

## 2014-01-22 DIAGNOSIS — I5022 Chronic systolic (congestive) heart failure: Secondary | ICD-10-CM

## 2014-01-22 DIAGNOSIS — I509 Heart failure, unspecified: Secondary | ICD-10-CM

## 2014-01-22 DIAGNOSIS — N189 Chronic kidney disease, unspecified: Secondary | ICD-10-CM

## 2014-01-22 MED ORDER — AMIODARONE HCL 200 MG PO TABS: 200.0000 mg | ORAL_TABLET | Freq: Every day | ORAL | Status: AC

## 2014-01-22 MED ORDER — FUROSEMIDE 20 MG PO TABS: 20.0000 mg | ORAL_TABLET | Freq: Every day | ORAL | Status: DC

## 2014-01-22 MED ORDER — METOPROLOL SUCCINATE ER 25 MG PO TB24
25.0000 mg | ORAL_TABLET | Freq: Every day | ORAL | Status: DC
Start: 2014-01-22 — End: 2014-01-22

## 2014-01-22 MED ORDER — FUROSEMIDE 20 MG PO TABS: 20.0000 mg | ORAL_TABLET | Freq: Every day | ORAL | Status: AC

## 2014-01-22 MED ORDER — AMIODARONE HCL 200 MG PO TABS: 200.0000 mg | ORAL_TABLET | Freq: Every day | ORAL | Status: DC

## 2014-01-22 MED ORDER — METOPROLOL SUCCINATE ER 25 MG PO TB24: 25.0000 mg | ORAL_TABLET | Freq: Every day | ORAL | Status: AC

## 2014-01-22 NOTE — Progress Notes (Signed)
Patient ID: [name] [name], male   DOB: December 31, 1929, 80 y.o.   MRN: [id number] PCP: Dr. Madilyn Fireman  80 yo with history of CAD s/p CABG, ulcerative colitis s/p colectomy, CHF, and lung cancer presents for cardiology followup.  He had CABG in 94/85 complicated by post-operative atrial fibrillation.  He was started on amiodarone while in the hospital after CABG and this was continued due to risk of further atrial fibrillation in the setting of significantly enlarged left atrium.  He was admitted to the hospital in [place] in 4/62 with a diastolic CHF exacerbation and was diuresed.  He went to the hospital again in [place] in 7/03 with a diastolic CHF exacerbation. He had an echo done there with EF 35-40%, severe MR, PA systolic pressure 50-09 mmHg.   [name] [name] has had paroxysmal atrial fibrillation.  I started him on coumadin (NOACs not covered by his insurance) and Toprol XL.  In 9/13, I sent him for TEE-guided cardioversion on amiodarone.  He converted to NSR.  I did admit him after cardioversion for diuresis.  TEE prior to DCCV confirmed EF 35-40% with severe, post-infarction [name] in the setting of severe hypokinesis to akinesis of the inferior and posterior walls. Repeat echo in NSR in 11/13 showed EF 45-50% with severe [name].  Most recent echo in 5/14 showed EF 45-50% with only mild [name].   [name] [name] developed a right supraclavicular solid neck mass that may be lung adenocarcinoma primary, though no obvious primary lung site was noted on imaging.  He has had radiation directed to the supraclavicular mass and had recent treatment with carboplatin and Alimta.  He was admitted in 10/14 after with dehydration and AKI (creatinine to 5.9) post-chemotherapy.  He had no appetite and very poor po intake.  In this setting, he went back into atrial fibrillation with RVR.  His rate was initially difficult to control and I had planned to try to cardiovert him back to NSR given resting HR in the 130s.  However, he was  admitted in 12/14 with a UTI and weakness.  With medication adjustment, his HR decreased to the 90s-100s, so it was decided not to cardiovert him.  Given focus at this point on comfort care, coumadin was stopped.  Hospice is now following.   Since last visit, he had a biopsy to look at tumor genetic markers with an eye towards targeted treatment.  He looks better today though his right supraclavicular mass has been growing again.  He is back in NSR today.  Weight is stable.  At last appointment, I started him back on Lasix.  He is taking it 5 days a week. He is walking with a walker and doing better in terms of mobility.  No exertional dyspnea walking on flat ground.    Labs (9/12): LDL 81, HDL 54, K 4.2, creatinine 1.9, BNP 1065 Labs (11/12): K 4.1, creatinine 1.7, BNP 837 Labs (1/13): K 3.7, creatinine 1.6, HDL 52, LDL 100 Labs (5/13): K 4.4, creatinine 1.5, HDL 47, LDL 76 Labs (7/13): creatinine 2.0 Labs (8/13): K 3.9, creatinine 1.9, BNP 1066 Labs (9/13): K 3.5, creatinine 2.4=>2.3, BNP 971, LFTs normal, TSH normal Labs (10/13): K 4.6, creatinine 1.87 Labs (11/13): K 4.5, creatinine 2.4, LDL 73, HDL 50 Labs (5/14): K 4.3, creatinine 1.95, LFTs normal, HCT 33.4 Labs (10/14): LFTs normal, TSH normal Labs (11/14): K 3.3, creatinine 5.9 => 2.3, HCT 23.7=>23.2, plts 9 => 60=>103 Labs (12/14): K 4.9, creatinine 2.49=>2.9, HCT 30.2, plts 72, LFTs normal Labs (  1/15): HCT 33, Plts 87, K 4.3, creatinine 2.4  ECG: NSR, RBBB, LPFB, old inferior MI   PMH: 1. Paroxysmal atrial fibrillation: Noted initially post-op CABG in 10/11. Back in atrial fibrillation in 8/13, amiodarone restarted along with coumadin.  TEE-guided DCCV in 9/13.  Back in atrial fibrillation in 10/14 with coumadin held because of decreased plts.  2. Knee osteoarthritis 3. Anemia of uncertain etiology: had erythropoietin shots for a period of time.  4. CAD: MI at age 63.  Had CABG in 10/11 with LIMA-LAD, SVG-D, and SVG-RCA.  Lexiscan  myoview (9/13) with EF 34%, large inferior infarct from base to apex with no ischemia.   5. Systolic CHF: Echo (9/56) with EF 50%, inferior and inferoseptal hypokinesis, mild LV hypertrophy, moderate [name], severe left atrial enlargement.  Echo ([place], 7/13): EF 35-40%, severe [name], moderate-severe LAE, PASP 50-60 mmHg.  TEE (9/13): EF 35-40%, posterior and inferior severe HK to AK, severe post-infarction [name], mildly decreased RV systolic function. Echo (11/13) with EF 45-50%, LV dilation, severe [name], mild RV dilation with moderate RV dysfunction.  6. Ulcerative colitis: s/p total colectomy with ileostomy.  7. HTN 8. Hyperlipidemia 9. Nephrolithiasis 10. CKD: H/o ARF/hyperkalemia in the setting of dehydration.  He is off ACEI and spironolactone.  11. [name]: Severe on 7/13 echo and 9/13 TEE.  Suspect post-infarction [name] in the setting of inferoposterior wall motion abnormality.  12. Gout 13. Metastatic lung adenocarcinoma with right supraclavicular mass diagnosed in 2014.  Patient had caroplatin/Alimta chemotherapy and tolerated poorly.   SH: Retired from [employer].  Married, lives in [place]. Stopped smoking in 1970.    FH: Strong history of CAD.  Father with MI at 74.  All siblings (24) have now passed away, many with heart disease.   ROS: All systems reviewed and negative except as per HPI.   Current Outpatient Prescriptions  Medication Sig Dispense Refill  . acetaminophen (TYLENOL) 500 MG tablet Take 500 mg by mouth every 6 (six) hours as needed for moderate pain.      [name] allopurinol (ZYLOPRIM) 100 MG tablet Take 100 mg by mouth at bedtime.       [name] atorvastatin (LIPITOR) 40 MG tablet Take 20 mg by mouth every evening. 1/2 tablet daily      . cholecalciferol (VITAMIN D) 1000 UNITS tablet Take 1,000 Units by mouth every morning.       . feeding supplement, RESOURCE BREEZE, (RESOURCE BREEZE) LIQD Take 1 Container by mouth 2 (two) times daily with a meal.  60 Container  0  . ferrous  sulfate 325 (65 FE) MG tablet Take 325 mg by mouth every evening.      . fish oil-omega-3 fatty acids 1000 MG capsule Take 1 g by mouth 2 (two) times daily.       [name] FLAXSEED, LINSEED, PO Take 1 tablet by mouth at bedtime.       . folic acid (FOLVITE) 213 MCG tablet Take 400 mcg by mouth every morning.       . nitroGLYCERIN (NITROSTAT) 0.4 MG SL tablet Place 1 tablet (0.4 mg total) under the tongue every 5 (five) minutes as needed for chest pain.  10 tablet  0  . promethazine (PHENERGAN) 25 MG tablet Take 1 tablet (25 mg total) by mouth every 6 (six) hours as needed for nausea or vomiting.  30 tablet  0  . Tamsulosin HCl (FLOMAX) 0.4 MG CAPS Take 0.4 mg by mouth daily after supper.       [name]  amiodarone (PACERONE) 200 MG tablet Take 1 tablet (200 mg total) by mouth daily.  90 tablet  3  . furosemide (LASIX) 20 MG tablet Take 1 tablet (20 mg total) by mouth daily.  90 tablet  3  . metoprolol succinate (TOPROL XL) 25 MG 24 hr tablet Take 1 tablet (25 mg total) by mouth daily.  90 tablet  3  . [DISCONTINUED] pantoprazole (PROTONIX) 40 MG tablet Take 40 mg by mouth daily.         No current facility-administered medications for this visit.   Facility-Administered Medications Ordered in Other Visits  Medication Dose Route Frequency Provider Last Rate Last Dose  . heparin lock flush 100 unit/mL  250 Units Intracatheter PRN Volanda Napoleon, MD      . sodium chloride 0.9 % injection 10 mL  10 mL Intracatheter PRN Volanda Napoleon, MD        BP 90/64  Pulse 67  Ht 5\' 4"  (1.626 m)  Wt 63.05 kg (139 lb)  BMI 23.85 kg/m2 General: NAD Neck: JVP 7 cm, no thyromegaly or thyroid nodule.  Lungs: Slightly crackles at bases bilaterally. CV: Nondisplaced PMI.  Heart regular S1/S2, no S3/S4, 1/6 HSM at apex. 1+ edema 1/4 to knees bilaterally.  No carotid bruit.  Abdomen: Soft, nontender, no hepatosplenomegaly, no distention.  Neurologic: Alert and oriented x 3.  Psych: Normal affect. Extremities: No clubbing  or cyanosis.  Skin: Solid mass at right supraclavicular area  Assessment/Plan  1. Atrial fibrillation  Patient is back in NSR today.  As it does appear he can hold NSR, I am going to start him back on amiodarone to try to maintain NSR.  He will start 200 mg daily.  Since his BP is soft, I will decrease Toprol XL to 25 mg once daily.  As he is on hospice and wants to avoid anticoagulation, I will leave him off coumadin.   2. Chronic systolic CHF  EF 93-71% on most recent echo, suspect ischemic cardiomyopathy plays major role. NYHA class II.  Volume status better today on Lasix. - He is off spironolactone and ACEI after ARF with hyperkalemia and given baseline significantly CKD.   - Hydralazine/Imdur and Coreg have been held with low blood pressure recently.  He has tolerated Toprol XL.  - Increase Lasix to every day, rather than 5 days/week.  3. HYPERLIPIDEMIA  Continue atorvastatin with known CAD.   4. Mitral regurgitation  This appeared to be post-infarction [name] with inferoposterior severe hypokinesis to akinesis.  Most recent echo was read as showing mild [name].   5. CAD  Status post CABG.  Last [facility] showed dense inferior scar with no ischemia.  Continue medical management.  Continue statin.   6. CKD  Creatinine has remained stable. 7. [name] [name] is now being followed by hospice.    Loralie Champagne 01/22/2014

## 2014-01-22 NOTE — Patient Instructions (Signed)
Decrease metoprolol succinate (Toprol XL) to 25mg  daily.   Take amiodarone 200mg  daily.  Take lasix (furosemide) 20mg  every day.   Your physician recommends that you schedule a follow-up appointment in: 2 months with Dr Aundra Dubin.

## 2014-01-23 ENCOUNTER — Encounter (HOSPITAL_COMMUNITY): Payer: Self-pay

## 2014-01-27 ENCOUNTER — Ambulatory Visit: Payer: Medicare Other

## 2014-01-27 ENCOUNTER — Ambulatory Visit (HOSPITAL_BASED_OUTPATIENT_CLINIC_OR_DEPARTMENT_OTHER): Payer: Medicare Other | Admitting: Hematology & Oncology

## 2014-01-27 ENCOUNTER — Other Ambulatory Visit (HOSPITAL_BASED_OUTPATIENT_CLINIC_OR_DEPARTMENT_OTHER): Payer: Medicare Other | Admitting: Lab

## 2014-01-27 ENCOUNTER — Encounter: Payer: Self-pay | Admitting: Hematology & Oncology

## 2014-01-27 VITALS — BP 113/65 | HR 69 | Temp 97.5°F | Resp 18 | Ht 63.0 in | Wt 135.0 lb

## 2014-01-27 DIAGNOSIS — N189 Chronic kidney disease, unspecified: Secondary | ICD-10-CM

## 2014-01-27 DIAGNOSIS — T451X5A Adverse effect of antineoplastic and immunosuppressive drugs, initial encounter: Secondary | ICD-10-CM

## 2014-01-27 DIAGNOSIS — I340 Nonrheumatic mitral (valve) insufficiency: Secondary | ICD-10-CM

## 2014-01-27 DIAGNOSIS — D62 Acute posthemorrhagic anemia: Secondary | ICD-10-CM

## 2014-01-27 DIAGNOSIS — D61810 Antineoplastic chemotherapy induced pancytopenia: Secondary | ICD-10-CM

## 2014-01-27 DIAGNOSIS — N179 Acute kidney failure, unspecified: Secondary | ICD-10-CM

## 2014-01-27 DIAGNOSIS — C3490 Malignant neoplasm of unspecified part of unspecified bronchus or lung: Secondary | ICD-10-CM

## 2014-01-27 DIAGNOSIS — C799 Secondary malignant neoplasm of unspecified site: Secondary | ICD-10-CM

## 2014-01-27 DIAGNOSIS — D509 Iron deficiency anemia, unspecified: Secondary | ICD-10-CM

## 2014-01-27 DIAGNOSIS — D638 Anemia in other chronic diseases classified elsewhere: Secondary | ICD-10-CM

## 2014-01-27 DIAGNOSIS — I4891 Unspecified atrial fibrillation: Secondary | ICD-10-CM

## 2014-01-27 DIAGNOSIS — C770 Secondary and unspecified malignant neoplasm of lymph nodes of head, face and neck: Secondary | ICD-10-CM

## 2014-01-27 DIAGNOSIS — I2510 Atherosclerotic heart disease of native coronary artery without angina pectoris: Secondary | ICD-10-CM

## 2014-01-27 LAB — CMP (CANCER CENTER ONLY)
ALBUMIN: 2.4 g/dL — AB (ref 3.3–5.5)
ALT(SGPT): 11 U/L (ref 10–47)
AST: 23 U/L (ref 11–38)
Alkaline Phosphatase: 50 U/L (ref 26–84)
BUN, Bld: 25 mg/dL — ABNORMAL HIGH (ref 7–22)
CHLORIDE: 106 meq/L (ref 98–108)
CO2: 27 mEq/L (ref 18–33)
CREATININE: 1.5 mg/dL — AB (ref 0.6–1.2)
Calcium: 7.7 mg/dL — ABNORMAL LOW (ref 8.0–10.3)
Glucose, Bld: 85 mg/dL (ref 73–118)
Potassium: 2.7 mEq/L — CL (ref 3.3–4.7)
Sodium: 137 mEq/L (ref 128–145)
TOTAL PROTEIN: 5.4 g/dL — AB (ref 6.4–8.1)
Total Bilirubin: 1.2 mg/dl (ref 0.20–1.60)

## 2014-01-27 LAB — CBC WITH DIFFERENTIAL (CANCER CENTER ONLY)
BASO#: 0 10*3/uL (ref 0.0–0.2)
BASO%: 0 % (ref 0.0–2.0)
EOS%: 0.1 % (ref 0.0–7.0)
Eosinophils Absolute: 0 10*3/uL (ref 0.0–0.5)
HCT: 35.5 % — ABNORMAL LOW (ref 38.7–49.9)
HGB: 11.3 g/dL — ABNORMAL LOW (ref 13.0–17.1)
LYMPH#: 0.3 10*3/uL — AB (ref 0.9–3.3)
LYMPH%: 4.1 % — ABNORMAL LOW (ref 14.0–48.0)
MCH: 33.6 pg — ABNORMAL HIGH (ref 28.0–33.4)
MCHC: 31.8 g/dL — AB (ref 32.0–35.9)
MCV: 106 fL — ABNORMAL HIGH (ref 82–98)
MONO#: 0.7 10*3/uL (ref 0.1–0.9)
MONO%: 8.7 % (ref 0.0–13.0)
NEUT%: 87.1 % — ABNORMAL HIGH (ref 40.0–80.0)
NEUTROS ABS: 6.5 10*3/uL (ref 1.5–6.5)
Platelets: 58 10*3/uL — ABNORMAL LOW (ref 145–400)
RBC: 3.36 10*6/uL — ABNORMAL LOW (ref 4.20–5.70)
RDW: 17.3 % — AB (ref 11.1–15.7)
WBC: 7.5 10*3/uL (ref 4.0–10.0)

## 2014-01-27 MED ORDER — POTASSIUM CHLORIDE CRYS ER 20 MEQ PO TBCR: 20.0000 meq | EXTENDED_RELEASE_TABLET | Freq: Two times a day (BID) | ORAL | Status: AC

## 2014-01-27 NOTE — Progress Notes (Signed)
This office note has been dictated.

## 2014-01-28 ENCOUNTER — Encounter: Payer: Self-pay | Admitting: Nurse Practitioner

## 2014-01-28 ENCOUNTER — Telehealth: Payer: Self-pay | Admitting: Hematology & Oncology

## 2014-01-28 NOTE — Progress Notes (Signed)
Per my previous note, after receiving a call back from [facility]. I was informed pt was never admitted to their services and the previous orders were null. Contacted the pt who stated he refused their services for personal reasons. Would like to be set up with [facility]. Referral completed via phone and orders for labs, admission, symptom management, and possible PT for quality of life and conditioning faxed to [phone] as requested.

## 2014-01-28 NOTE — Telephone Encounter (Signed)
Pt aware of 3-17 appointment

## 2014-01-28 NOTE — Progress Notes (Signed)
CC:   Beatrice Lecher, M.D. [facility], [place] [phone]  DIAGNOSES: 1. 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 2. Chronic atrial fibrillation. 3. Chronic renal insufficiency.  CURRENT THERAPY: 1. Aranesp 300 mcg subcu q.3 weeks for hemoglobin less than 10. 2. The patient to start radiation therapy.  INTERIM HISTORY:  [name] [name] comes in for his followup.  He actually looks a little bit better than when we last saw him.  Hospice for [facility] is prescribed to see him.  They want to see him once.  I am not sure what happened, but [name] was not too impressed with their services.  He was getting physical therapy at [facility].  I think this somehow is not being done.  He was told that hospice could do very physical therapy for him.  We did go ahead and get another biopsy of this right supraclavicular mass.  This was done on January 20th.  The pathology report ([id number]) shows a poorly-differentiated carcinoma with clear cell features.  He had genetic testing.  He was negative for EGFR, ALK, and ROS.  His last PET scan was done back in late December.  This did show some mild growth.  He is having little bit more problems with the right supraclavicular mass.  I have called Dr. Sondra Come of Radiation Oncology.  He will see [name] [name] and see if any more radiation can be given.  [name] seems to be eating a little better.  His weight is down 11 pounds since we last saw him.  He has atrial fibrillation.  He is not on any anticoagulation now, which I agree with given bleeding risk.  He has had no diarrhea.  He has had no hemoptysis.  Has a little bit of dry cough.  Overall, his performance status is ECOG 3.  PHYSICAL EXAMINATION:  This is an elderly white gentleman in no obvious distress.  Vital Signs:  Temperature of 97.5, pulse 69, respiratory rate 18, blood pressure 113/65, weight is 135 pounds.  Head and Neck: Normocephalic and  atraumatic skull.  There are no ocular or oral lesions.  There are no palpable cervical or supraclavicular lymph nodes. He does have fullness in the right supraclavicular region.  There is an actual mass there, probably measures about 3 x 3 cm.  Lungs:  With some slight decrease at the bases.  Cardiac:  Regular rate and rhythm with a normal S1 and S2.  He has a 1/6 soft ejection murmur.  Abdomen:  Soft. He has good bowel sounds.  There is no fluid wave.  There is no palpable abdominal mass.  There is no palpable hepatosplenomegaly.  Back:  No tenderness over the spine, ribs, or hips.  He has some slight kyphosis. Extremities:  1+ chronic edema in his legs.  His muscle strength is 3/5 bilaterally.  Neurological:  No focal neurological deficits.  LABORATORY STUDIES:  White cell count is 7.5, hemoglobin 11.3, hematocrit 35.5, platelet count 58,000.  Sodium 137, potassium 3.7.  BUN 25, creatinine 1.5.  Albumin is 2.4. Calcium 7.7.  IMPRESSION:  [name] [name] is an 80 year old gentleman.  He has this poorly- differentiated lung cancer.  This was treated with radiation therapy initially.  We then tried him on some systemic chemotherapy, which he did not tolerate well.  His disease has been kept so much in check.  Our goal now is quality of life.  I just want make sure that he is able to function and not have any issues.  Hopefully, hospice will be able to accomplish this for [place].  He will have radiation therapy.  Hopefully, this will help with any symptoms that he may have from this right supraclavicular mass.  I spent about 45 minutes with he and his family.  They were all very nice.  We all are "on the same page" with respect to our goals.  I told Mr. Clowers that if he continues to lose weight that this will be the determinant of his prognosis and how long he lives.  The more weight he loses, the more stress he will put on his heart.  He understands this.  We will have hospice draw lab  work on him every 2 weeks.  I want to see him back in 6 weeks.  I think at that point, we will have a very good idea as to what time frame we are looking at.    ______________________________ Volanda Napoleon, M.D. PRE/MEDQ  D:  01/27/2014  T:  01/28/2014  Job:  [id number]

## 2014-01-28 NOTE — Progress Notes (Signed)
Orders given to [place] [phone]) for labs to be drawn q2weeks cbc/cmet and sent to [place]. He verbalized understanding and will begin on next week 02/03/14.

## 2014-01-29 ENCOUNTER — Ambulatory Visit: Payer: Medicare Other | Admitting: Cardiology

## 2014-01-29 NOTE — Progress Notes (Signed)
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 Walker? Wheelchair?:  SAFETY ISSUES: Prior radiation?July 28 through August 15, Right supraclavicular mass and upper mediastinum 37.5 gray in 15 fractions  Pacemaker/ICD? no  Possible current pregnancy? no  Is the patient on methotrexate?   Additional Complaints / other details:

## 2014-01-30 ENCOUNTER — Encounter: Payer: Self-pay | Admitting: *Deleted

## 2014-01-30 NOTE — Progress Notes (Addendum)
Thoracic Location of Tumor / Histology: right supraclavicular mass, lung primary  Patient presented 6 months ago with symptoms of: right supraclavicular mass  Biopsies of right neck  (if applicable) revealed:  04/10[birth year]52  Diagnosis Lymph node, biopsy, right neck - POORLY DIFFERENTIATED CARCINOMA WITH CLEAR CELL FEATURES AND ASSOCIATED ABUNDANT TUMOR NECROSIS. - PLEASE SEE COMMENT. Microscopic Comment The morphologic features are similar to those seen in the previous case ([id number]). Per clinician's request, the block will be sent out for EGFR, ALK and ROS molecular testing and an addendum report will follow. Dr. Gari Crown agrees. (HCL:kh 01-14-14)  01/16/14 FISH Study: ALK gene rearrangement not detected- 0 % of cells positive for ALK rearrangement  Tobacco/Marijuana/Snuff/ETOH use: quit smoking 44 yrs ago, cigarettes, cigars, 15 pk-yr history, no smokeless tobacco, denies alcohol  Past/Anticipated interventions by cardiothoracic surgery, if any:   Past/Anticipated interventions by medical oncology, if any: Aranesp 300 mcg subcu q.3 weeks for hemoglobin less than 10   Signs/Symptoms  Weight changes, if any: 10 lb weight loss since Nov 2014  Respiratory complaints, if any:   Hemoptysis, if any: No   Pain issues, if any: No   SAFETY ISSUES:  Prior radiation? 07/21/13- 08/08/13 right supraclavicular mass and upper mediastinum 37.5 gray in 15 sessions  Pacemaker/ICD? no  Possible current pregnancy? na  Is the patient on methotrexate? no  Current Complaints / other details:  Mr Ruffins reports that the large, hard nodule on  His right neck, which had decreased in size, is now larger

## 2014-02-02 ENCOUNTER — Other Ambulatory Visit: Payer: Self-pay | Admitting: Nurse Practitioner

## 2014-02-02 ENCOUNTER — Ambulatory Visit (HOSPITAL_COMMUNITY)
Admission: RE | Admit: 2014-02-02 | Discharge: 2014-02-02 | Disposition: A | Discharge status: Home / Self Care | Payer: Medicare Other | Source: Ambulatory Visit | Attending: Hematology & Oncology | Admitting: Hematology & Oncology

## 2014-02-02 ENCOUNTER — Encounter: Payer: Self-pay | Admitting: Radiation Oncology

## 2014-02-02 ENCOUNTER — Ambulatory Visit
Admission: RE | Admit: 2014-02-02 | Discharge: 2014-02-02 | Disposition: A | Discharge status: Home / Self Care | Source: Ambulatory Visit | Attending: Radiation Oncology | Admitting: Radiation Oncology

## 2014-02-02 VITALS — BP 96/78 | HR 106 | Temp 97.5°F | Ht 63.0 in | Wt 139.9 lb

## 2014-02-02 DIAGNOSIS — C799 Secondary malignant neoplasm of unspecified site: Secondary | ICD-10-CM

## 2014-02-02 DIAGNOSIS — Z79899 Other long term (current) drug therapy: Secondary | ICD-10-CM | POA: Insufficient documentation

## 2014-02-02 DIAGNOSIS — Z87891 Personal history of nicotine dependence: Secondary | ICD-10-CM | POA: Insufficient documentation

## 2014-02-02 DIAGNOSIS — R599 Enlarged lymph nodes, unspecified: Secondary | ICD-10-CM | POA: Insufficient documentation

## 2014-02-02 DIAGNOSIS — C3490 Malignant neoplasm of unspecified part of unspecified bronchus or lung: Secondary | ICD-10-CM | POA: Insufficient documentation

## 2014-02-02 HISTORY — DX: Personal history of irradiation: Z92.3

## 2014-02-02 NOTE — Progress Notes (Signed)
Radiation Oncology         (336) [phone] ________________________________  Name: [name] [name] MRN: [id number]  Date: 02/02/2014  DOB: 04/01/1930  Follow-Up Visit Note  CC: METHENEY,CATHERINE, MD  Volanda Napoleon, MD  Diagnosis:  Stage IV adenocarcinoma of lung  Interval Since Last Radiation:  5  months  Narrative:  The patient returns today for further evaluation and consideration for radiation therapy. The patient completed palliative radiation therapy directed at the right supraclavicular region in August of 2014. The patient's large right supraclavicular mass decreased in size but more recently has begun growing again. This causes some discomfort for the patient.  He also has a lesion in the right buttocks area which is positive on PET scan but has not been bothering the patient.                            ALLERGIES:  has No Known Allergies.  Meds: Current Outpatient Prescriptions  Medication Sig Dispense Refill  . acetaminophen (TYLENOL) 500 MG tablet Take 500 mg by mouth every 6 (six) hours as needed for moderate pain.      [name] amiodarone (PACERONE) 200 MG tablet Take 1 tablet (200 mg total) by mouth daily.  90 tablet  3  . atorvastatin (LIPITOR) 40 MG tablet Take 20 mg by mouth every evening. 1/2 tablet daily      . cholecalciferol (VITAMIN D) 1000 UNITS tablet Take 1,000 Units by mouth every morning.       . feeding supplement, RESOURCE BREEZE, (RESOURCE BREEZE) LIQD Take 1 Container by mouth 2 (two) times daily with a meal.  60 Container  0  . ferrous sulfate 325 (65 FE) MG tablet Take 325 mg by mouth every evening.      . fish oil-omega-3 fatty acids 1000 MG capsule Take 1 g by mouth 2 (two) times daily.       [name] FLAXSEED, LINSEED, PO Take 1 tablet by mouth at bedtime.       . folic acid (FOLVITE) 983 MCG tablet Take 400 mcg by mouth every morning.       . furosemide (LASIX) 20 MG tablet Take 1 tablet (20 mg total) by mouth daily.  90 tablet  3  . metoprolol succinate (TOPROL  XL) 25 MG 24 hr tablet Take 1 tablet (25 mg total) by mouth daily.  90 tablet  3  . nitroGLYCERIN (NITROSTAT) 0.4 MG SL tablet Place 1 tablet (0.4 mg total) under the tongue every 5 (five) minutes as needed for chest pain.  10 tablet  0  . potassium chloride SA (K-DUR,KLOR-CON) 20 MEQ tablet Take 1 tablet (20 mEq total) by mouth 2 (two) times daily.  60 tablet  2  . promethazine (PHENERGAN) 25 MG tablet Take 25 mg by mouth as needed for nausea or vomiting.      . Tamsulosin HCl (FLOMAX) 0.4 MG CAPS Take 0.4 mg by mouth daily after supper.       [name] allopurinol (ZYLOPRIM) 100 MG tablet Take 100 mg by mouth at bedtime.       . [DISCONTINUED] pantoprazole (PROTONIX) 40 MG tablet Take 40 mg by mouth daily.         No current facility-administered medications for this encounter.   Facility-Administered Medications Ordered in Other Encounters  Medication Dose Route Frequency Provider Last Rate Last Dose  . heparin lock flush 100 unit/mL  250 Units Intracatheter PRN Volanda Napoleon, MD      .  sodium chloride 0.9 % injection 10 mL  10 mL Intracatheter PRN Volanda Napoleon, MD        Physical Findings: The patient is in no acute distress. Patient is alert and oriented.  height is 5\' 3"  (1.6 m) and weight is 139 lb 14.4 oz (63.458 kg). His temperature is 97.5 F (36.4 C). His blood pressure is 96/78 and his pulse is 106. His oxygen saturation is 99%. .  The right supraclavicular mass measures 7 x 5 cm on exam today. This is fixed. There is no skin involvement.  Careful palpation along the right buttocks region reveals no tender areas or palpable mass.  Lab Findings: Lab Results  Component Value Date   WBC 7.5 01/27/2014   HGB 11.3* 01/27/2014   HCT 35.5* 01/27/2014   MCV 106* 01/27/2014   PLT 58* 01/27/2014      Radiographic Findings: US Biopsy  01/13/2014   CLINICAL DATA:  Right supraclavicular enlarged lymph node  EXAM: ULTRASOUND-GUIDED BIOPSY OF A RIGHT SUPRACLAVICULAR LYMPH NODE. CORE.  MEDICATIONS  AND MEDICAL HISTORY: Versed One mg, Fentanyl 100 mcg.  Additional Medications: None.  ANESTHESIA/SEDATION: Moderate sedation time: 10 minutes  PROCEDURE: The procedure, risks, benefits, and alternatives were explained to the patient. Questions regarding the procedure were encouraged and answered. The patient understands and consents to the procedure.  The neck was prepped with patent in a sterile fashion, and a sterile drape was applied covering the operative field. A surgical mask and sterile gloves were used for the procedure.  Under sonographic guidance, four 18 gauge core biopsies of the enlarged right supraclavicular lymph node were obtained. Final imaging was performed.  Patient tolerated the procedure well without complication. Vital sign monitoring by nursing staff during the procedure will continue as patient is in the special procedures unit for post procedure observation.  FINDINGS: The images document guide needle placement within the right supraclavicular lymph node. Post biopsy images demonstrate no hemorrhage.  IMPRESSION: Successful ultrasound-guided core biopsy of an enlarged right supraclavicular lymph node.   Electronically Signed   By: Maryclare Bean M.D.   On: 01/13/2014 12:15    Impression:  Stage IV adenocarcinoma lung. Patient is having progression in the previous areas of treatment.  He would be a candidate for retreatment to this area with a short course over approximately 2 weeks.  I discussed potential treatment to the right buttocks area but since this is asymptomatic for the patient he does not wish to have this area treated.  Plan:  Simulation and planning tomorrow. Anticipate 10 treatments directed at the right supraclavicular region.  ____________________________________ Blair Promise, MD

## 2014-02-02 NOTE — Progress Notes (Signed)
Received a call from [place], [place] to confirm orders of biweekly lab draw (CBC/BMET). Also she informed us that per pt request Dr. Mena Pauls of [facility] has completed a DNR form and placed it in the pts home. If we have any additional questions or orders we may contact her at [phone].

## 2014-02-03 ENCOUNTER — Ambulatory Visit
Admission: RE | Admit: 2014-02-03 | Discharge: 2014-02-03 | Disposition: A | Discharge status: Home / Self Care | Source: Ambulatory Visit | Attending: Radiation Oncology | Admitting: Radiation Oncology

## 2014-02-03 ENCOUNTER — Other Ambulatory Visit: Payer: Self-pay | Admitting: *Deleted

## 2014-02-03 DIAGNOSIS — C3490 Malignant neoplasm of unspecified part of unspecified bronchus or lung: Secondary | ICD-10-CM | POA: Insufficient documentation

## 2014-02-03 DIAGNOSIS — C799 Secondary malignant neoplasm of unspecified site: Secondary | ICD-10-CM

## 2014-02-03 MED ORDER — ALLOPURINOL 100 MG PO TABS: 100.0000 mg | ORAL_TABLET | Freq: Every day | ORAL | Status: AC

## 2014-02-03 NOTE — Progress Notes (Signed)
  Radiation Oncology         (336) [phone] ________________________________  Name: [name] [name] MRN: [id number]  Date: 02/03/2014  DOB: 07-22-1930  SIMULATION AND TREATMENT PLANNING NOTE  DIAGNOSIS:  Stage IV adenocarcinoma of lung   NARRATIVE:  The patient was brought to the [facility].  Identity was confirmed.  All relevant records and images related to the planned course of therapy were reviewed.  The patient freely provided informed written consent to proceed with treatment after reviewing the details related to the planned course of therapy. The consent form was witnessed and verified by the simulation staff.  Then, the patient was set-up in a stable reproducible  supine position for radiation therapy.  CT images were obtained.  Surface markings were placed.  The CT images were loaded into the planning software.  Then the target and avoidance structures were contoured.  Treatment planning then occurred.  The radiation prescription was entered and confirmed.  Then, I designed and supervised the construction of a total of 1 medically necessary complex treatment devices.  I have requested : Intensity Modulated Radiotherapy (IMRT) is medically necessary for this case for the following reason:  Previous treatment to this area..  I have ordered:dose calc.  PLAN:  The patient will receive 24 Gy in 8 fractions.  ________________________________   Special treatment procedure note  Additional time was taken today in reviewing the patient's current set up as it relates to his previous treatments. Given the increased potential for toxicities as well as the necessity for close monitoring of the patient related to overlapping fields, this constitutes a special treatment procedure. -----------------------------------  Blair Promise, PhD, MD

## 2014-02-03 NOTE — Telephone Encounter (Signed)
Pt cannot afford colchicine and would like allopurinol rx to be written and placed up front for p/u.[name] [name]

## 2014-02-06 ENCOUNTER — Encounter: Payer: Self-pay | Admitting: Hematology & Oncology

## 2014-02-09 ENCOUNTER — Encounter: Payer: Self-pay | Admitting: Nurse Practitioner

## 2014-02-09 ENCOUNTER — Telehealth: Payer: Self-pay | Admitting: Oncology

## 2014-02-09 NOTE — Telephone Encounter (Signed)
Called [name] [name] back and advised her to call the hospice nurse for [facility] for his sore throat and weakness per Dr. Sondra Come.  Advised her to call back if they need to reschedule or cancel his radiation treatment starting on Thursday, 02/12/2014.

## 2014-02-09 NOTE — Progress Notes (Signed)
Spoke w/pt's wife and she stated they were not sure if they wanted to continue with XRT as pt has had a sore throat and irritation since the treatments had started. She stated she had contacted Dr. Clabe Seal office and waiting for a response. She states he is drinking ok just has a decrease in his food intake. Encouraged pt to also utilize Hospice services as she stated she had questions over the weekend regarding what to eat and drink and waited until Monday to contact the office. Pt verbalized understanding the use of resources and will await a return response from radiation as to what to do regarding his throat irritation and the questionable need to finish his upcoming treatments.

## 2014-02-09 NOTE — Telephone Encounter (Signed)
[name] [name], [name] [name]'s wife called and said [name] has had a sore throat for the past 3 days and is not eating.  He is swallowing liquids but is not taking any of his medications.  [name] [name] said he is sleeping a lot and is weak.  He is supposed to start radiation 02/11/14.

## 2014-02-11 ENCOUNTER — Ambulatory Visit: Admitting: Radiation Oncology

## 2014-02-11 ENCOUNTER — Telehealth: Payer: Self-pay | Admitting: Oncology

## 2014-02-11 NOTE — Telephone Encounter (Signed)
Called Izora Gala, Jakyle's wife to see how he is doing after canceling his radiation treatment for today.  Per Lynita Lombard is very weak, not eating or drinking except for 1 boost per day and not taking his medications.  She said he is sleeping a lot.  She said hospice had been out to see him today.  She is wondering if radiation will be of any benefit to [place] since it is so hard to get him in and out to the house for treatment.

## 2014-02-12 ENCOUNTER — Ambulatory Visit

## 2014-02-12 ENCOUNTER — Encounter: Payer: Self-pay | Admitting: Nurse Practitioner

## 2014-02-12 NOTE — Progress Notes (Signed)
Received a call from [place], [place] with [facility]. States pt is very weak, not eating or drinking much, had a rough time getting some pain management during the night, not taking much medications, and has been slightly confused today. Per the wife she is concerned about the continuation of radiation therapy. Dr. Marin Olp stated this can be stopped if needed, however, he wanted to make sure there was no underlying issues going on. RN stated bp was stable at 118/70 HR 69 Resp 16. Orders given to draw stat cmp and cbc and get results to [place] to ensure he isn't dehydrated or have any sort of infection. RN verbalized understanding and stated she would get these done stat.

## 2014-02-13 ENCOUNTER — Encounter: Payer: Self-pay | Admitting: Hematology & Oncology

## 2014-02-13 ENCOUNTER — Ambulatory Visit

## 2014-02-13 ENCOUNTER — Encounter: Payer: Self-pay | Admitting: *Deleted

## 2014-02-13 NOTE — Progress Notes (Signed)
[facility] [place] Psychosocial Distress Screening Clinical Social Work  Clinical Social Work was referred by distress screening protocol.  The patient scored a 5 on the Psychosocial Distress Thermometer which indicates moderate distress. Clinical Social Worker contacted patient's spouse by phone to assess for distress and other psychosocial needs.  Mrs. Rempel states the patient is currently very weak and somewhat disoriented.  Patient's spouse shared that [facility] is currently involved and has visited multiple times this week.  CSW and patient's spouse discussed utilizing support such as friends and family to help support caregiver during this time.  Patient spouse was appreciative of call.  No other CSW needs at this time.   Clinical Social Worker follow up needed: no  If yes, follow up plan:   Polo Riley, MSW, LCSW, OSW-C Clinical Social Worker [facility] [phone]

## 2014-02-16 ENCOUNTER — Ambulatory Visit

## 2014-02-17 ENCOUNTER — Ambulatory Visit

## 2014-02-17 ENCOUNTER — Ambulatory Visit: Admitting: Radiation Oncology

## 2014-02-18 ENCOUNTER — Ambulatory Visit

## 2014-02-19 ENCOUNTER — Ambulatory Visit

## 2014-02-20 ENCOUNTER — Ambulatory Visit

## 2014-02-23 ENCOUNTER — Ambulatory Visit

## 2014-02-24 ENCOUNTER — Ambulatory Visit

## 2014-02-25 ENCOUNTER — Ambulatory Visit (HOSPITAL_COMMUNITY)
Admission: RE | Admit: 2014-02-25 | Discharge: 2014-02-25 | Disposition: A | Discharge status: Home / Self Care | Payer: Medicare Other | Source: Ambulatory Visit | Attending: Hematology & Oncology | Admitting: Hematology & Oncology

## 2014-03-10 ENCOUNTER — Other Ambulatory Visit: Payer: Medicare Other | Admitting: Lab

## 2014-03-10 ENCOUNTER — Ambulatory Visit: Payer: Medicare Other | Admitting: Hematology & Oncology

## 2014-03-10 ENCOUNTER — Ambulatory Visit: Payer: Medicare Other

## 2014-03-18 ENCOUNTER — Telehealth: Payer: Self-pay

## 2014-03-18 NOTE — Telephone Encounter (Signed)
Patient past away @ Home per Iver Nestle

## 2014-03-19 ENCOUNTER — Ambulatory Visit: Payer: Medicare Other | Admitting: Cardiology

## 2014-03-25 DEATH — deceased

## 2014-08-18 ENCOUNTER — Other Ambulatory Visit: Payer: Self-pay | Admitting: Pharmacist

## 2014-10-31 IMAGING — CR DG HAND COMPLETE 3+V*R*
3 series · 3 of 3 positions shown · non-contrast
Comparison: None.

CLINICAL DATA: Redness and swelling of the right index finger.
History of gout.

RIGHT HAND - COMPLETE 3+ VIEW

[x hand pa right]
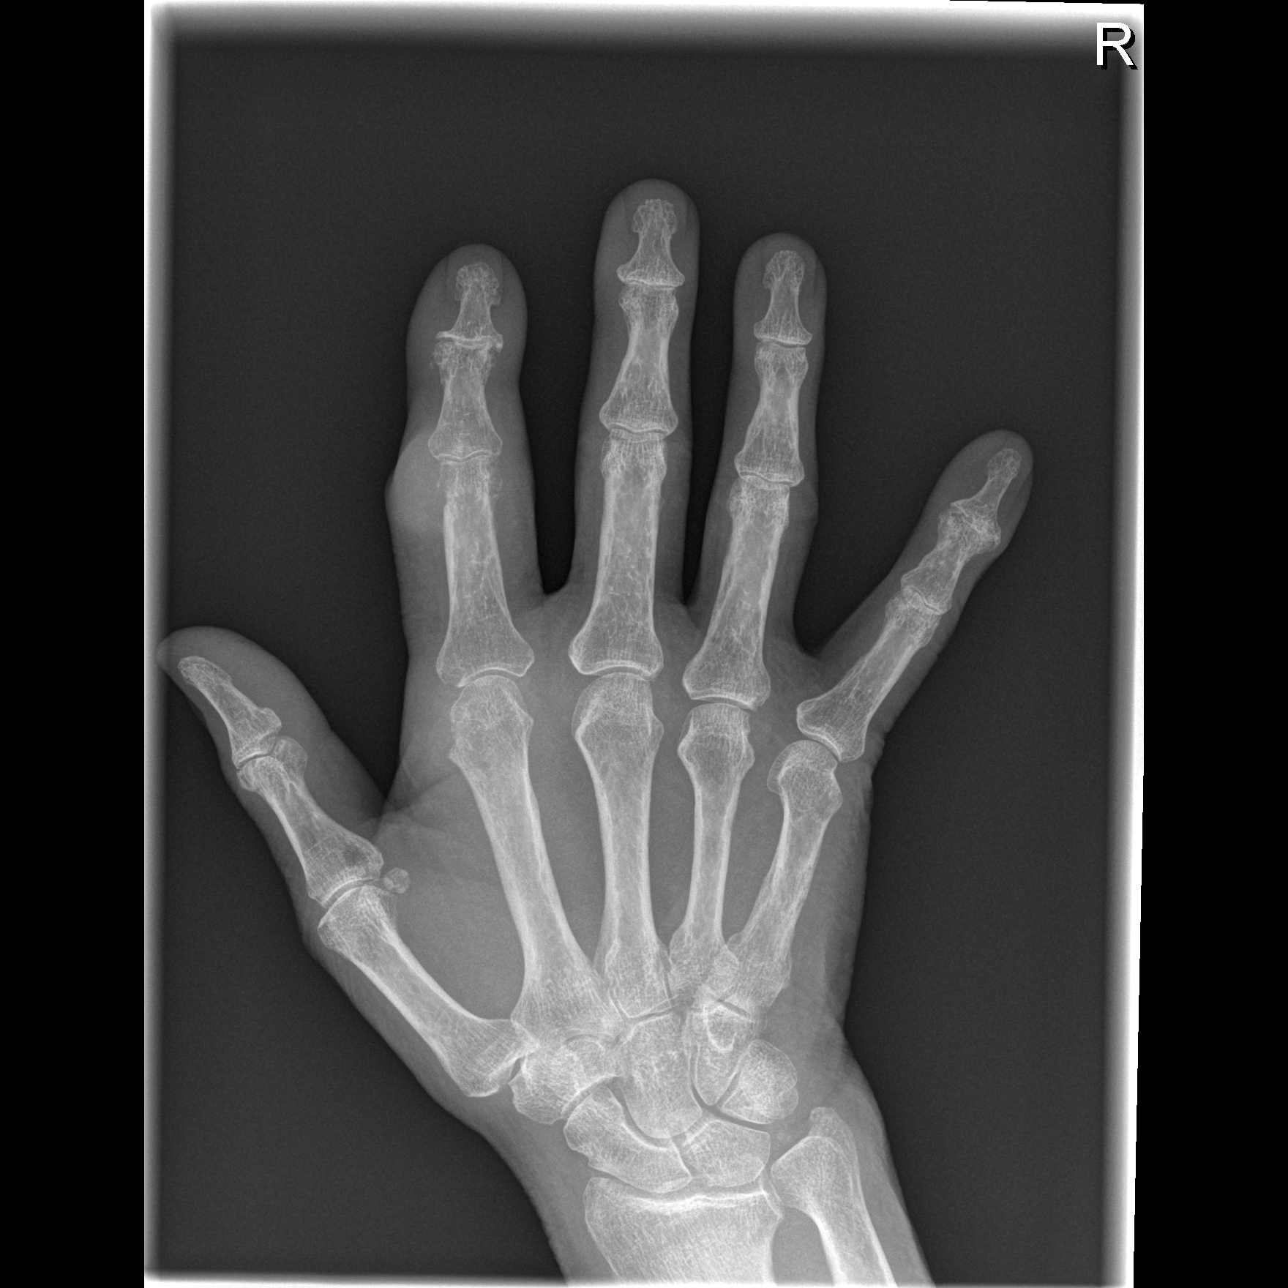

[x hand oblique right]
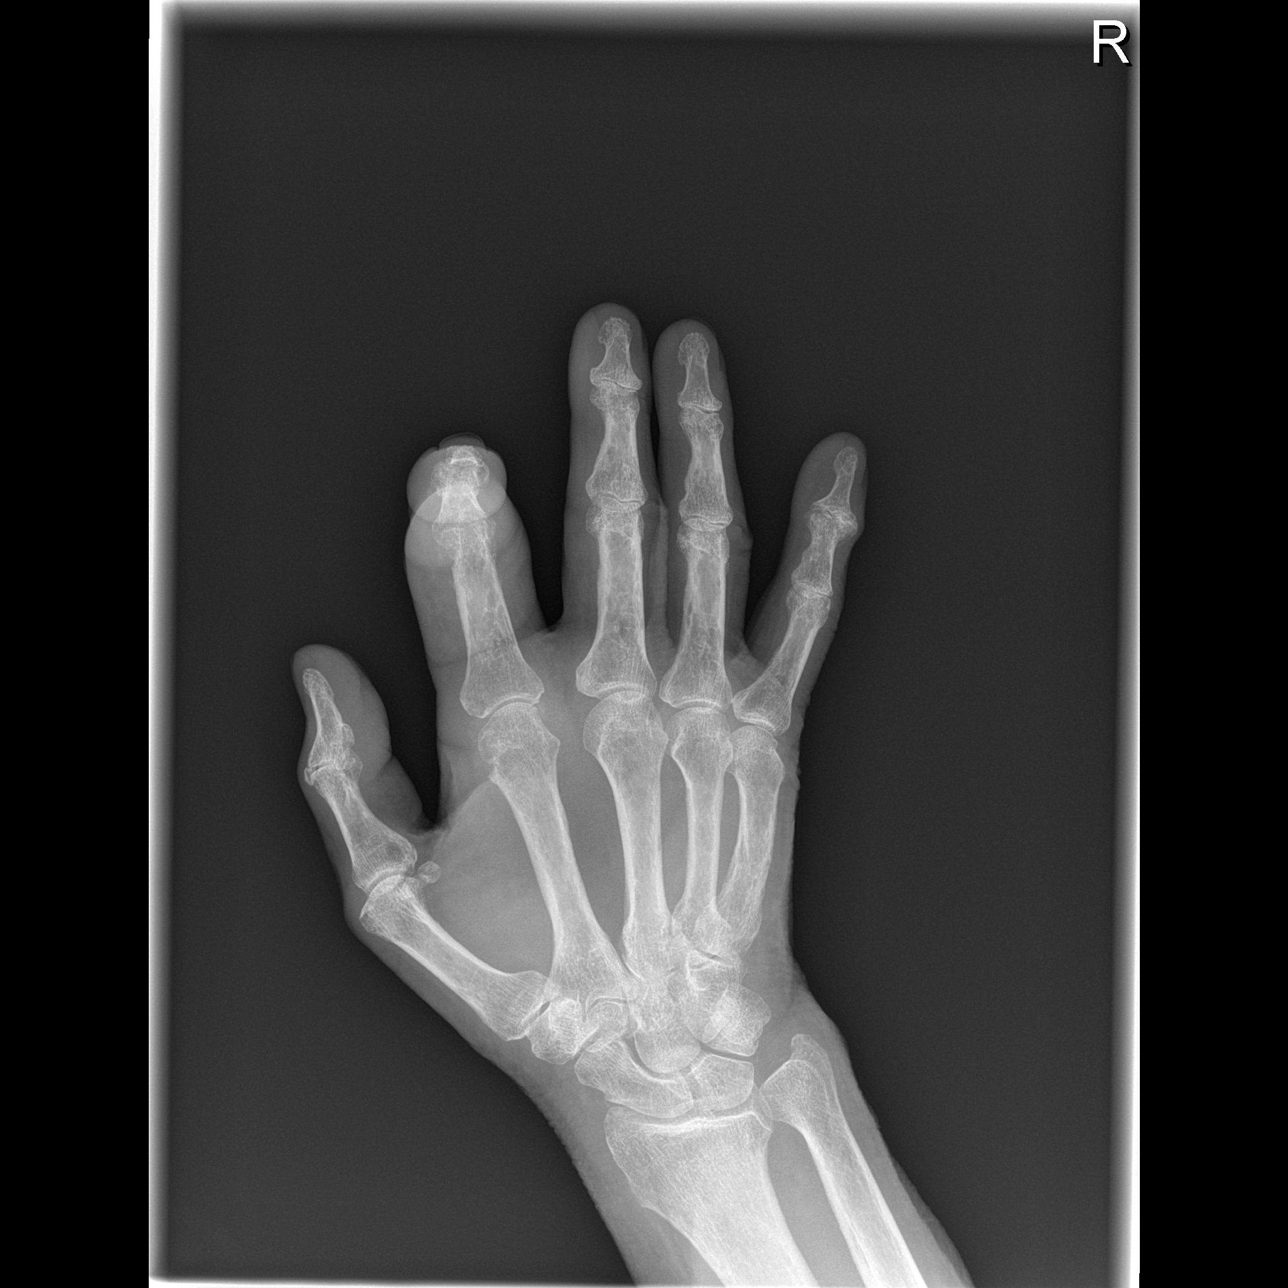

[x hand lat right]
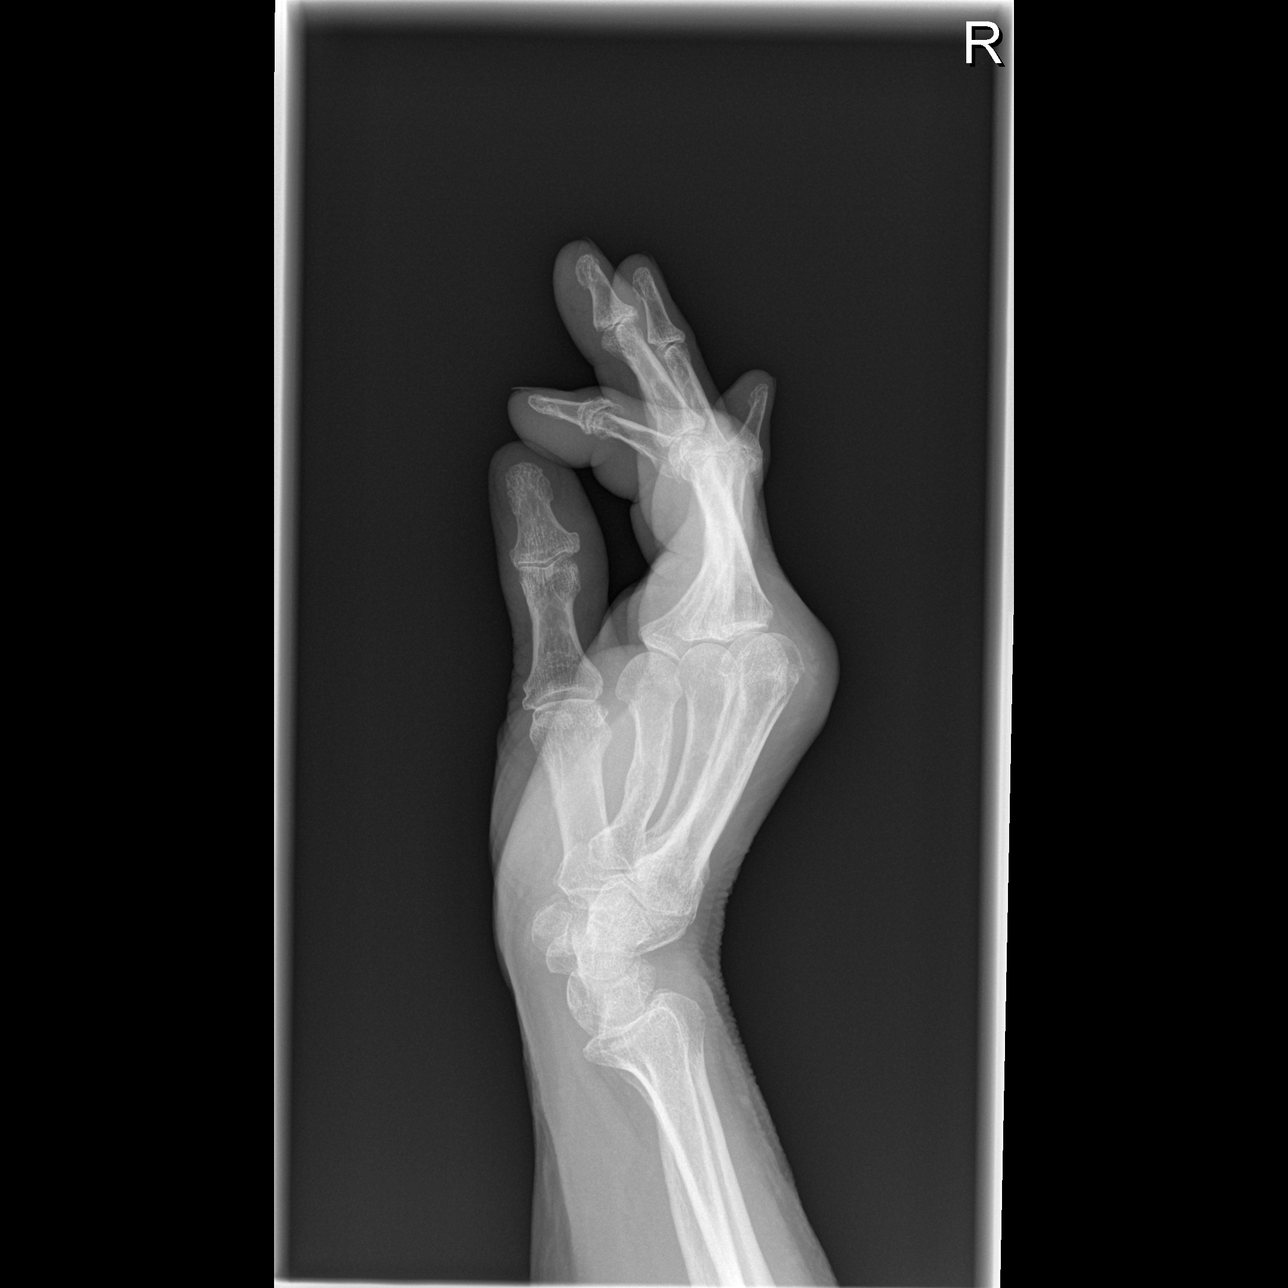

[3 of 3 positions shown; findings below may reference images not displayed]

FINDINGS: Study is degraded by positioning of the index finger.
There is no acute fracture evident.  Healed fracture of the fifth
metacarpal noted.  In the index finger, there is some degenerative
change in the DIP joints but punched out lesions are seen at the
PIP joints and at the MCP joints.
IMPRESSION: Changes in the PIP joint of the index finger with overlying soft
tissue swelling suggests gout.

## 2014-11-07 IMAGING — CR DG ABDOMEN 1V
1 series · 1 of 1 positions shown · non-contrast
Comparison: 04/30/2013

CLINICAL DATA: Right ureteral stone

ABDOMEN - 1 VIEW

[AP]
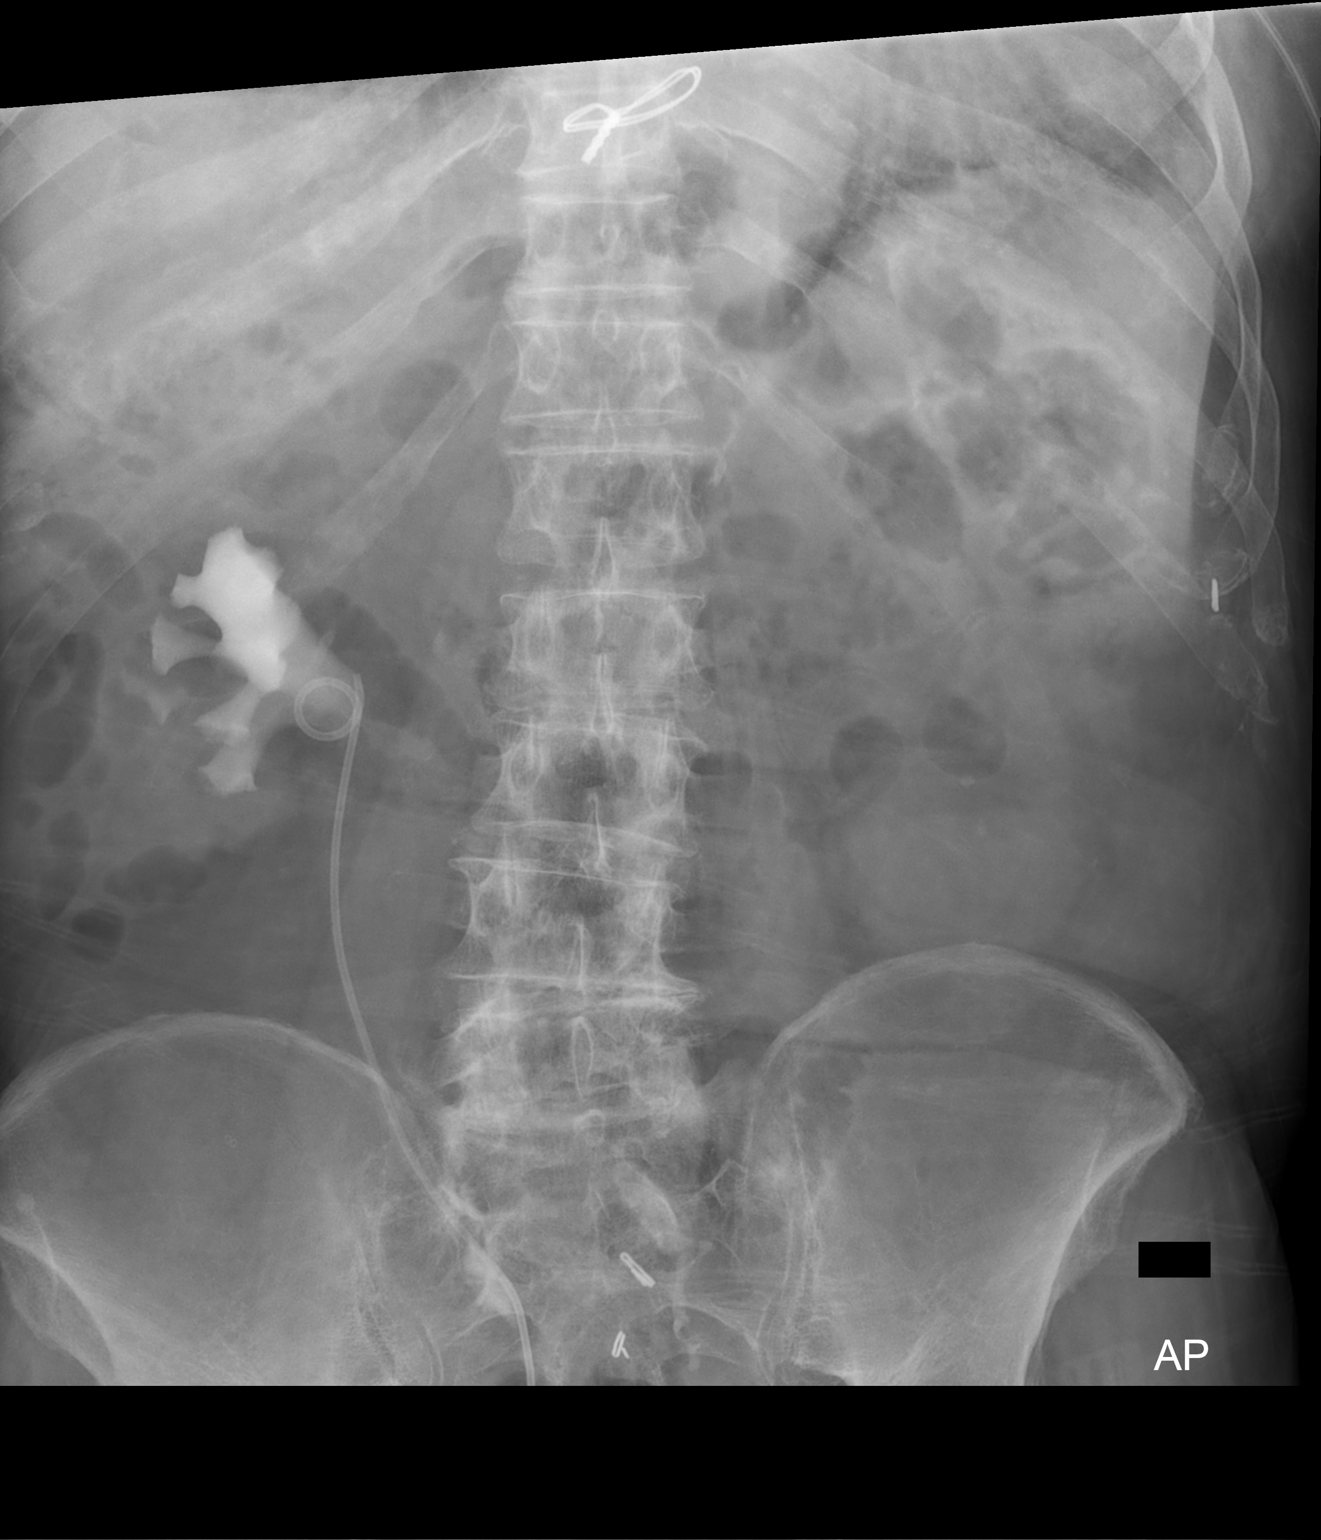

[1 of 1 positions shown; findings below may reference images not displayed]

FINDINGS: A right ureteral stent is noted with contrast material in
the collecting system.  It is in satisfactory position.  Increased
density is noted along the midportion of the stent which may be
related to the patient's known ureteral stone or possibly related
to recent contrast injection.  The degree of dilatation is much
less than that seen on prior CT examination.
IMPRESSION: Status post right ureteral stent placement.  The degree of
dilatation of the collecting system is significantly decreased from
prior CT examination.  Increased densities noted along the
midportion of the stent which may be related to the known ureteral
stones.  Correlation with operative findings is recommended.

## 2015-01-07 ENCOUNTER — Encounter (HOSPITAL_COMMUNITY): Payer: Self-pay | Admitting: Cardiology

## 2015-02-24 IMAGING — US IR FLUORO GUIDE CV LINE*L*
1 series · 1 of 1 positions shown · non-contrast
Comparison: none

CLINICAL DATA: Metastatic adenocarcinoma to the mediastinum and
right supraclavicular neck of probable along the origin.  The
patient requires a Port-A-Cath to begin chemotherapy.

[Series 1: sp fluoro guide cv line*left* · 1 of 1 slices shown]
[im 1/1]
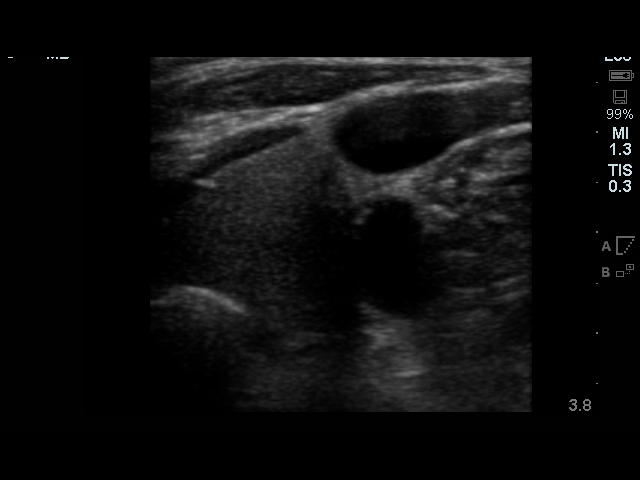

[1 of 1 positions shown; findings below may reference images not displayed]

IMPLANTED PORT A CATH PLACEMENT WITH ULTRASOUND AND FLUOROSCOPIC
GUIDANCE

Sedation:  2.0 mg IV Versed; 100 mcg IV Fentanyl.

Total Moderate Sedation Time:  40 minutes.

Additional Medications:  2 grams IV Ancef.  As antibiotic
prophylaxis, Ancef was ordered pre-procedure and administered
intravenously within one hour of incision.

Fluoroscopy Time:  42 seconds.

Procedure:  The procedure, risks, benefits, and alternatives were
explained to the patient.  Questions regarding the procedure were
encouraged and answered.  The patient understands and consents to
the procedure.

Ultrasound was used to confirm patency of the left internal jugular
vein.  The left neck and chest were prepped with chlorhexidine in a
sterile fashion, and a sterile drape was applied covering the
operative field.  Maximum barrier sterile technique with sterile
gowns and gloves were used for the procedure.  Local anesthesia was
provided with 1% lidocaine and lidocaine with epinephrine.

After creating a small venotomy incision, a 21 gauge needle was
advanced into the left internal jugular vein under direct, real-
time ultrasound guidance.  Ultrasound image documentation was
performed.  After securing guidewire access, an 8 Fr dilator was
placed.  A J-wire was kinked to measure appropriate catheter
length.

A subcutaneous port pocket was then created along the upper chest
wall utilizing sharp and blunt dissection.  Portable cautery was
utilized.  The pocket was irrigated with sterile saline.

A single lumen power injectable port was chosen for placement.  The
8 Fr catheter was tunneled from the port pocket site to the
venotomy incision.  The port was placed in the pocket.  External
catheter was trimmed to appropriate length based on guidewire
measurement.

At the venotomy, an 8 Fr peel-away sheath was placed over a
guidewire.  The catheter was then placed through the sheath and the
sheath removed.  Final catheter positioning was confirmed and
documented with a fluoroscopic spot image.  The port was accessed
with a needle and aspirated and flushed with heparinized saline.
The needle was removed.

The venotomy and port pocket incisions were closed with
subcutaneous 3-0 Monocryl and subcuticular 4-0 Vicryl.  Dermabond
was applied to both incisions.

Complications: None.  No pneumothorax.
FINDINGS: After catheter placement, the tip lies at the cavoatrial
junction.  The catheter aspirates normally and is ready for
immediate use.
IMPRESSION: Placement of single lumen port a cath via left internal jugular
vein.  The catheter tip lies at the cavoatrial junction.  A power
injectable port a cath was placed and is ready for immediate use.
